# Patient Record
Sex: Female | Born: 1935
Health system: Southern US, Community
[De-identification: ages and names within clinical notes are randomized; demographics above are authoritative.]

## PROBLEM LIST (undated history)

## (undated) DIAGNOSIS — F0390 Unspecified dementia without behavioral disturbance: Secondary | ICD-10-CM

## (undated) DIAGNOSIS — R05 Cough: Secondary | ICD-10-CM

## (undated) DIAGNOSIS — G4730 Sleep apnea, unspecified: Secondary | ICD-10-CM

## (undated) DIAGNOSIS — I10 Essential (primary) hypertension: Secondary | ICD-10-CM

## (undated) DIAGNOSIS — K449 Diaphragmatic hernia without obstruction or gangrene: Secondary | ICD-10-CM

## (undated) DIAGNOSIS — M792 Neuralgia and neuritis, unspecified: Secondary | ICD-10-CM

## (undated) DIAGNOSIS — I509 Heart failure, unspecified: Secondary | ICD-10-CM

## (undated) DIAGNOSIS — M109 Gout, unspecified: Secondary | ICD-10-CM

## (undated) DIAGNOSIS — E039 Hypothyroidism, unspecified: Secondary | ICD-10-CM

## (undated) DIAGNOSIS — IMO0001 Reserved for inherently not codable concepts without codable children: Secondary | ICD-10-CM

## (undated) DIAGNOSIS — Z22322 Carrier or suspected carrier of Methicillin resistant Staphylococcus aureus: Secondary | ICD-10-CM

## (undated) DIAGNOSIS — J302 Other seasonal allergic rhinitis: Secondary | ICD-10-CM

## (undated) DIAGNOSIS — R059 Cough, unspecified: Secondary | ICD-10-CM

## (undated) DIAGNOSIS — M1990 Unspecified osteoarthritis, unspecified site: Secondary | ICD-10-CM

## (undated) DIAGNOSIS — K219 Gastro-esophageal reflux disease without esophagitis: Secondary | ICD-10-CM

## (undated) DIAGNOSIS — J449 Chronic obstructive pulmonary disease, unspecified: Secondary | ICD-10-CM

## (undated) DIAGNOSIS — I739 Peripheral vascular disease, unspecified: Secondary | ICD-10-CM

## (undated) DIAGNOSIS — J45909 Unspecified asthma, uncomplicated: Secondary | ICD-10-CM

## (undated) DIAGNOSIS — I499 Cardiac arrhythmia, unspecified: Secondary | ICD-10-CM

## (undated) HISTORY — PX: COLONOSCOPY: SHX174

## (undated) HISTORY — PX: TONSILLECTOMY: SUR1361

## (undated) HISTORY — PX: BACK SURGERY: SHX140

## (undated) HISTORY — PX: UVULOPALATOPHARYNGOPLASTY: SHX827

## (undated) HISTORY — DX: Cardiac arrhythmia, unspecified: I49.9

## (undated) HISTORY — PX: ANKLE ARTHROSCOPY: SUR85

## (undated) HISTORY — PX: CHOLECYSTECTOMY: SHX55

## (undated) HISTORY — DX: Unspecified dementia, unspecified severity, without behavioral disturbance, psychotic disturbance, mood disturbance, and anxiety: F03.90

---

## 1993-02-24 HISTORY — PX: CARDIAC CATHETERIZATION: SHX172

## 2001-02-24 HISTORY — PX: PARATHYROIDECTOMY: SHX19

## 2003-12-13 ENCOUNTER — Ambulatory Visit: Payer: Self-pay

## 2003-12-19 ENCOUNTER — Ambulatory Visit: Payer: Self-pay

## 2004-07-16 ENCOUNTER — Ambulatory Visit: Payer: Self-pay | Admitting: Family Medicine

## 2004-11-19 ENCOUNTER — Ambulatory Visit: Payer: Self-pay | Admitting: Family Medicine

## 2005-10-09 ENCOUNTER — Ambulatory Visit: Payer: Self-pay | Admitting: Family Medicine

## 2006-11-09 ENCOUNTER — Ambulatory Visit: Payer: Self-pay | Admitting: Family Medicine

## 2006-11-11 ENCOUNTER — Ambulatory Visit: Payer: Self-pay | Admitting: Family Medicine

## 2006-12-09 ENCOUNTER — Ambulatory Visit: Payer: Self-pay | Admitting: Gastroenterology

## 2008-08-13 ENCOUNTER — Emergency Department: Payer: Self-pay | Admitting: Internal Medicine

## 2009-04-03 ENCOUNTER — Ambulatory Visit: Payer: Self-pay | Admitting: Unknown Physician Specialty

## 2009-04-24 ENCOUNTER — Ambulatory Visit: Payer: Self-pay | Admitting: Pain Medicine

## 2009-05-10 ENCOUNTER — Ambulatory Visit: Payer: Self-pay | Admitting: Pain Medicine

## 2009-07-03 ENCOUNTER — Ambulatory Visit: Payer: Self-pay | Admitting: Pain Medicine

## 2009-07-19 ENCOUNTER — Ambulatory Visit: Payer: Self-pay | Admitting: Pain Medicine

## 2011-04-03 ENCOUNTER — Emergency Department: Payer: Self-pay | Admitting: Emergency Medicine

## 2011-04-03 LAB — URINALYSIS, COMPLETE
Bilirubin,UR: NEGATIVE
Glucose,UR: NEGATIVE mg/dL (ref 0–75)
Hyaline Cast: 2
Protein: NEGATIVE
Squamous Epithelial: 3

## 2011-04-03 LAB — COMPREHENSIVE METABOLIC PANEL
Albumin: 3.5 g/dL (ref 3.4–5.0)
Anion Gap: 8 (ref 7–16)
Calcium, Total: 10 mg/dL (ref 8.5–10.1)
Chloride: 106 mmol/L (ref 98–107)
EGFR (African American): 47 — ABNORMAL LOW
Glucose: 102 mg/dL — ABNORMAL HIGH (ref 65–99)
Osmolality: 293 (ref 275–301)
Potassium: 5.1 mmol/L (ref 3.5–5.1)
Sodium: 141 mmol/L (ref 136–145)

## 2011-04-03 LAB — CBC
MCV: 85 fL (ref 80–100)
Platelet: 277 10*3/uL (ref 150–440)
RBC: 4.39 10*6/uL (ref 3.80–5.20)
RDW: 15.4 % — ABNORMAL HIGH (ref 11.5–14.5)
WBC: 12.7 10*3/uL — ABNORMAL HIGH (ref 3.6–11.0)

## 2011-04-03 LAB — TROPONIN I: Troponin-I: <0.02 ng/mL

## 2011-04-05 LAB — URINE CULTURE

## 2011-12-10 ENCOUNTER — Observation Stay: Payer: Self-pay | Admitting: Specialist

## 2011-12-10 LAB — COMPREHENSIVE METABOLIC PANEL
Albumin: 3.3 g/dL — ABNORMAL LOW (ref 3.4–5.0)
Alkaline Phosphatase: 92 U/L (ref 50–136)
Anion Gap: 9 (ref 7–16)
Bilirubin,Total: 0.5 mg/dL (ref 0.2–1.0)
Calcium, Total: 9.6 mg/dL (ref 8.5–10.1)
Creatinine: 1.16 mg/dL (ref 0.60–1.30)
Glucose: 108 mg/dL — ABNORMAL HIGH (ref 65–99)
Osmolality: 275 (ref 275–301)
Potassium: 4.1 mmol/L (ref 3.5–5.1)
SGOT(AST): 23 U/L (ref 15–37)
Sodium: 135 mmol/L — ABNORMAL LOW (ref 136–145)

## 2011-12-10 LAB — URINALYSIS, COMPLETE
Bilirubin,UR: NEGATIVE
Glucose,UR: NEGATIVE mg/dL (ref 0–75)
Hyaline Cast: 5
Ketone: NEGATIVE
Ph: 6 (ref 4.5–8.0)
Protein: NEGATIVE
RBC,UR: 2 /HPF (ref 0–5)
Squamous Epithelial: 3
WBC UR: 81 /HPF (ref 0–5)

## 2011-12-10 LAB — CBC: HGB: 11.7 g/dL — ABNORMAL LOW (ref 12.0–16.0)

## 2011-12-10 LAB — CK TOTAL AND CKMB (NOT AT ARMC): CK-MB: 0.7 ng/mL (ref 0.5–3.6)

## 2011-12-10 LAB — TROPONIN I: Troponin-I: <0.02 ng/mL

## 2011-12-11 LAB — CBC WITH DIFFERENTIAL/PLATELET
Basophil #: 0.1 10*3/uL (ref 0.0–0.1)
Basophil %: 0.4 %
Eosinophil %: 6.1 %
Lymphocyte #: 3.1 10*3/uL (ref 1.0–3.6)
MCH: 27.6 pg (ref 26.0–34.0)
MCV: 85 fL (ref 80–100)
Monocyte #: 1.4 x10 3/mm — ABNORMAL HIGH (ref 0.2–0.9)
Platelet: 280 10*3/uL (ref 150–440)
RBC: 3.63 10*6/uL — ABNORMAL LOW (ref 3.80–5.20)
RDW: 14.4 % (ref 11.5–14.5)

## 2011-12-11 LAB — BASIC METABOLIC PANEL
Anion Gap: 7 (ref 7–16)
BUN: 30 mg/dL — ABNORMAL HIGH (ref 7–18)
Calcium, Total: 9.5 mg/dL (ref 8.5–10.1)
Chloride: 100 mmol/L (ref 98–107)
EGFR (African American): 44 — ABNORMAL LOW
EGFR (Non-African Amer.): 38 — ABNORMAL LOW
Glucose: 94 mg/dL (ref 65–99)
Osmolality: 272 (ref 275–301)
Potassium: 4.4 mmol/L (ref 3.5–5.1)
Sodium: 133 mmol/L — ABNORMAL LOW (ref 136–145)

## 2011-12-15 ENCOUNTER — Emergency Department: Payer: Self-pay | Admitting: Emergency Medicine

## 2011-12-15 LAB — COMPREHENSIVE METABOLIC PANEL
Alkaline Phosphatase: 95 U/L (ref 50–136)
Anion Gap: 4 — ABNORMAL LOW (ref 7–16)
Bilirubin,Total: 0.2 mg/dL (ref 0.2–1.0)
Calcium, Total: 10.2 mg/dL — ABNORMAL HIGH (ref 8.5–10.1)
Co2: 29 mmol/L (ref 21–32)
EGFR (Non-African Amer.): >60
Glucose: 88 mg/dL (ref 65–99)
Osmolality: 275 (ref 275–301)
Potassium: 5.1 mmol/L (ref 3.5–5.1)
SGOT(AST): 13 U/L — ABNORMAL LOW (ref 15–37)
Sodium: 137 mmol/L (ref 136–145)

## 2011-12-15 LAB — CBC
HCT: 34.7 % — ABNORMAL LOW (ref 35.0–47.0)
MCHC: 32 g/dL (ref 32.0–36.0)
Platelet: 378 10*3/uL (ref 150–440)
RBC: 4.13 10*6/uL (ref 3.80–5.20)
RDW: 14.4 % (ref 11.5–14.5)
WBC: 8.2 10*3/uL (ref 3.6–11.0)

## 2011-12-15 LAB — URINALYSIS, COMPLETE
Bacteria: NONE SEEN
Glucose,UR: NEGATIVE mg/dL (ref 0–75)
Hyaline Cast: 3
Ketone: NEGATIVE
Leukocyte Esterase: NEGATIVE
Nitrite: NEGATIVE
Protein: NEGATIVE
RBC,UR: 2 /HPF (ref 0–5)
WBC UR: 1 /HPF (ref 0–5)

## 2011-12-15 LAB — URIC ACID: Uric Acid: 2.4 mg/dL — ABNORMAL LOW (ref 2.6–6.0)

## 2011-12-15 LAB — TROPONIN I: Troponin-I: <0.02 ng/mL

## 2013-01-19 ENCOUNTER — Inpatient Hospital Stay: Payer: Self-pay | Admitting: Internal Medicine

## 2013-01-19 LAB — TROPONIN I
Troponin-I: <0.02 ng/mL
Troponin-I: 0.09 ng/mL — ABNORMAL HIGH
Troponin-I: 0.07 ng/mL — ABNORMAL HIGH

## 2013-01-19 LAB — COMPREHENSIVE METABOLIC PANEL
Albumin: 3.3 g/dL — ABNORMAL LOW (ref 3.4–5.0)
Alkaline Phosphatase: 76 U/L
Anion Gap: 4 — ABNORMAL LOW (ref 7–16)
Chloride: 107 mmol/L (ref 98–107)
Co2: 28 mmol/L (ref 21–32)
EGFR (African American): >60
EGFR (Non-African Amer.): 53 — ABNORMAL LOW
Osmolality: 281 (ref 275–301)
SGPT (ALT): 13 U/L (ref 12–78)
Sodium: 139 mmol/L (ref 136–145)

## 2013-01-19 LAB — CBC
HGB: 11.9 g/dL — ABNORMAL LOW (ref 12.0–16.0)
MCH: 25.5 pg — ABNORMAL LOW (ref 26.0–34.0)
MCV: 81 fL (ref 80–100)
Platelet: 278 10*3/uL (ref 150–440)
RBC: 4.67 10*6/uL (ref 3.80–5.20)

## 2013-01-19 LAB — URINALYSIS, COMPLETE
Bacteria: NONE SEEN
Bilirubin,UR: NEGATIVE
Ketone: NEGATIVE
Nitrite: NEGATIVE
Ph: 6 (ref 4.5–8.0)
Protein: NEGATIVE
RBC,UR: 1 /HPF (ref 0–5)
WBC UR: NONE SEEN /HPF (ref 0–5)

## 2013-01-19 LAB — CK TOTAL AND CKMB (NOT AT ARMC)
CK, Total: 259 U/L — ABNORMAL HIGH (ref 21–215)
CK-MB: 0.7 ng/mL (ref 0.5–3.6)

## 2013-01-19 LAB — LIPID PANEL
HDL Cholesterol: 74 mg/dL — ABNORMAL HIGH (ref 40–60)
Triglycerides: 152 mg/dL (ref 0–200)
VLDL Cholesterol, Calc: 30 mg/dL (ref 5–40)

## 2013-01-19 LAB — TSH: Thyroid Stimulating Horm: 8.87 u[IU]/mL — ABNORMAL HIGH

## 2013-01-19 LAB — CK
CK, Total: 247 U/L — ABNORMAL HIGH (ref 21–215)
CK, Total: 250 U/L — ABNORMAL HIGH (ref 21–215)

## 2013-01-20 LAB — LIPID PANEL
Cholesterol: 148 mg/dL (ref 0–200)
HDL Cholesterol: 68 mg/dL — ABNORMAL HIGH (ref 40–60)
Ldl Cholesterol, Calc: 47 mg/dL (ref 0–100)
Triglycerides: 164 mg/dL (ref 0–200)
VLDL Cholesterol, Calc: 33 mg/dL (ref 5–40)

## 2013-01-20 LAB — MAGNESIUM: Magnesium: 1.9 mg/dL

## 2013-01-20 LAB — PROTIME-INR
INR: 1
Prothrombin Time: 13.3 secs (ref 11.5–14.7)

## 2013-05-25 ENCOUNTER — Ambulatory Visit: Payer: Self-pay | Admitting: Urology

## 2013-05-25 DIAGNOSIS — N23 Unspecified renal colic: Secondary | ICD-10-CM | POA: Insufficient documentation

## 2013-05-25 DIAGNOSIS — N3946 Mixed incontinence: Secondary | ICD-10-CM | POA: Insufficient documentation

## 2013-05-25 DIAGNOSIS — M5430 Sciatica, unspecified side: Secondary | ICD-10-CM | POA: Insufficient documentation

## 2013-05-25 DIAGNOSIS — N3020 Other chronic cystitis without hematuria: Secondary | ICD-10-CM | POA: Insufficient documentation

## 2013-05-25 DIAGNOSIS — R339 Retention of urine, unspecified: Secondary | ICD-10-CM | POA: Insufficient documentation

## 2013-05-25 DIAGNOSIS — N281 Cyst of kidney, acquired: Secondary | ICD-10-CM | POA: Insufficient documentation

## 2013-06-15 DIAGNOSIS — M1990 Unspecified osteoarthritis, unspecified site: Secondary | ICD-10-CM | POA: Insufficient documentation

## 2013-06-15 DIAGNOSIS — K219 Gastro-esophageal reflux disease without esophagitis: Secondary | ICD-10-CM | POA: Insufficient documentation

## 2013-06-15 DIAGNOSIS — J449 Chronic obstructive pulmonary disease, unspecified: Secondary | ICD-10-CM | POA: Insufficient documentation

## 2013-06-15 DIAGNOSIS — I10 Essential (primary) hypertension: Secondary | ICD-10-CM | POA: Insufficient documentation

## 2013-06-15 DIAGNOSIS — F419 Anxiety disorder, unspecified: Secondary | ICD-10-CM | POA: Insufficient documentation

## 2013-12-08 DIAGNOSIS — R0602 Shortness of breath: Secondary | ICD-10-CM | POA: Insufficient documentation

## 2013-12-08 DIAGNOSIS — I890 Lymphedema, not elsewhere classified: Secondary | ICD-10-CM | POA: Insufficient documentation

## 2013-12-08 DIAGNOSIS — R600 Localized edema: Secondary | ICD-10-CM | POA: Insufficient documentation

## 2014-01-03 DIAGNOSIS — E785 Hyperlipidemia, unspecified: Secondary | ICD-10-CM | POA: Insufficient documentation

## 2014-05-16 ENCOUNTER — Ambulatory Visit: Payer: Self-pay | Admitting: Family Medicine

## 2014-06-13 NOTE — Discharge Summary (Signed)
PATIENT NAME:  [name], [name] MR#:  [id number] DATE OF BIRTH:  October 07, 1930  DATE OF ADMISSION:  12/10/2011 DATE OF DISCHARGE:  12/11/2011  For a detailed note, please take a look at the history and physical done by Dr. Pearletha Furl on admission.    DIAGNOSES AT DISCHARGE:  1. Lower extremity weakness and redness secondary to a suspected gout attack/cellulitis, now resolved.  2. Urinary tract infection.  3. Hypertension.  4. Obesity.  5. Peripheral neuropathy.   DIET: The patient is being discharged on a low sodium diet.   ACTIVITY: As tolerated.   FOLLOW-UP:  1. Follow-up with Dr. Domenick Gong in the next 1 to 2 weeks.  2. The patient is being discharged on home health physical therapy services.   DISCHARGE MEDICATIONS:  1. Lasix 40 mg b.i.d.  2. Singulair 10 mg daily.  3. Gabapentin 300 mg t.i.d.  4. Lyrica 75 mg b.i.d.  5. Macrobid 100 mg daily.  6. Uloric 80 mg daily.  7. Imipramine 25 mg daily.  8. Tylenol 500 mg as needed for headache. 9. Lisinopril 40 mg daily.  10. Ceftin 250 mg b.i.d. x5 days.   PERTINENT STUDIES DONE DURING THE HOSPITAL COURSE: Chest x-ray on admission showing poor inspiration, atelectasis.   Urine culture growing 50,000 colonies of gram-negative rod which is yet to be identified.   BRIEF HOSPITAL COURSE: This is a 81 year old female with medical problems as mentioned above who presented to the hospital on October 16th secondary to weakness and lower extremity redness.  1. Lower extremity redness/weakness. The exact etiology of this was unclear although the patient was thought to have a suspected lower extremity cellulitis with also an acute gout attack. Therefore, she was treated for both. She was started on IV ceftriaxone and also started on Uloric and Naprosyn as needed for her gout attack. Overnight the patient's redness, swelling, and also pain has significantly improved. Likely the cause of her redness and swelling was probably poor circulation and  chronic venostasis. She was strongly advised to keep a compression stocking on and keep her leg elevated. Unlikely this was a gout attack or acute cellulitis but she is empirically being discharged on p.o. Ceftin. Her white cell count was 18,000 on admission and has come down to 11.9. She was ambulated with the help of physical therapy and she qualified for home health services which is being arranged for her.  2. Urinary tract infection. She had an abnormal urinalysis on admission. She was empirically started on ceftriaxone. She currently is being discharged on p.o. Ceftin as stated.  3. Hypertension. The patient remained hemodynamically stable on her lisinopril which she will resume upon discharge.  4. Peripheral neuropathy. The patient was maintained on her gabapentin and Lyrica. She will resume that.   CODE STATUS: The patient is a FULL CODE.   TIME SPENT WITH THE DISCHARGE: 40 minutes.   ____________________________ Belia Heman. Verdell Carmine, MD vjs:drc D: 12/11/2011 14:22:04 ET T: 12/12/2011 11:16:36 ET JOB#: [id number]  cc: Belia Heman. Verdell Carmine, MD, <Dictator> Fonnie Jarvis. Ilene Qua, MD Henreitta Leber MD ELECTRONICALLY SIGNED 12/12/2011 12:22

## 2014-06-13 NOTE — H&P (Signed)
PATIENT NAME:  [name], [name] MR#:  [id number] DATE OF BIRTH:  11/08/1930  DATE OF ADMISSION:  12/10/2011  PRIMARY CARE PHYSICIAN: Dr. Billey Gosling ER PHYSICIAN: Dr. Renee Ramus ADMITTING PHYSICIAN: Dr. Pearletha Furl   PRESENTING COMPLAINT: Weakness and leg swelling x3 days.   HISTORY OF PRESENT ILLNESS: Patient is a 81 year old lady who was in her usual state of health until three days ago when started having progressive weakness. This was preceded by right leg swelling and redness. This started around the first toe and spreading down the rest of the leg. Denies any nausea, vomiting. No diarrhea. No recent long distance travel, sick contacts. Admits to episodes of fever, generalized body aches and body pain. Also admits to dysuria and frequency. No hematuria. With progressive insipidus presented today and was referred to hospitalist for workup which showed a urinary tract infection and possible cellulitis with gout right lower extremity. Patient denies any recent long distance travel, trauma or medication change.   REVIEW OF SYSTEMS: CONSTITUTIONAL: Positive for fever, weakness. EYES: No blurred vision, redness, discharge. ENT: No tinnitus, epistaxis, or difficulty swallowing. RESPIRATORY: No cough or shortness of breath. CARDIOVASCULAR: No chest pain, palpitations, syncope, exertional dyspnea. GASTROINTESTINAL: No nausea, vomiting, diarrhea, abdominal pain, or change in bowel habits. GENITOURINARY: Has dysuria and frequency but no hematuria. ENDOCRINE: No polyuria, polydipsia, heat or cold intolerance. HEMATOLOGIC: No anemia, easy bruising, bleeding, or swollen glands. SKIN: Has a rash and redness right lower extremity around the toe with swelling throughout the legs. NEURO: No numbness, dementia or seizures. MUSCULOSKELETAL: Has some tenderness around the foot, mostly at the base of the first toe. PSYCH: No anxiety or depression.   PAST MEDICAL HISTORY:  1. Hypothyroidism.  2. Obstructive sleep apnea, on  CPAP. 3. Morbid obesity. 4. Chronic pain.  5. Gastroesophageal reflux disease.  6. Chronic obstructive pulmonary disease.  7. Primary hyperparathyroidism.  8. History of asthma.  9. History of gout.   PAST SURGICAL HISTORY:  1. Throat surgery for parathyroid.  2. Appendectomy.  3. Thyroidectomy.  4. Cholecystectomy.  5. Ankle surgery.  6. Tonsillectomy. 7. Back surgery.   SOCIAL HISTORY:  Lives at home with the family. No alcohol, tobacco, or recreational drug use.   FAMILY HISTORY: Positive for renal disease but no coronary artery disease or diabetes.   ALLERGIES: Sulfa.   HOME MEDICATIONS:  1. Headache relief 500/25 mg 1 tablet at bedtime p.r.n.  2. Gabapentin 300 mg 3 times daily. 3. Lyrica 75 mg twice a day. 4. Imipramine 25 mg daily.  5. Uloric 80 mg daily.  6. Diuretic 40 mg Lasix twice a day.  7. Montelukast 10 mg once a day. 8. Nitrofurantoin microcrystals 100 mg capsules daily.   PHYSICAL EXAMINATION:  VITAL SIGNS: Temperature 99, pulse 78, respiratory rate 18, blood pressure 146/57 on arrival, now is 135/94.   GENERAL: Obese, elderly lady lying on the gurney, awake, alert, oriented in time, place, and person, in no distress.   HEENT: Atraumatic, normocephalic. Pupils equal, reactive to light, accommodation. Extraocular movement intact. Mucous membranes pink, moist. Patient is edentulous.   NECK: Supple. No JV distention.   CHEST: Good air entry. Few transmitted breath sounds. No rhonchi. No rales.   HEART: Regular rate, rhythm. No murmur.   ABDOMEN: Obese, pendulous, moves with respiration, nontender. Bowel sounds normoactive. No organomegaly.   EXTREMITIES: Trace bilateral pitting edema, right more than left with associated redness, swelling right foot starting from the base of the first toe from the metatarsophalangeal joint to the  distal portion of the foot. Tender to touch, warm.   NEUROLOGICAL: Cranial nerves II through XII grossly intact. No focal  deficits.   PSYCH: Affect appropriate to situation.   LABORATORY, DIAGNOSTIC, AND RADIOLOGICAL DATA: EKG showed normal sinus rhythm, rate of 71. CBC: White count 18, hemoglobin 12, platelets 348. Chemistry unremarkable. Creatinine 1.1, potassium 4.1, glucose 108, calcium 9.6. LFTs normal. CK 381. Troponin negative. Urinalysis shows positive nitrites, leukocyte esterase 3+, WBC 81, bacteria 3+.   IMPRESSION:  1. Urinary tract infection.  2. Gout right first toe, to rule out cellulitis.  3. Obstructive sleep apnea on CPAP. 4. Hypothyroidism. 5. Gastroesophageal reflux disease.  6. Obesity.  7. History of gout. 8. Asthma.   PLAN: Admit to general medical floor under observation for blood culture x2. Check TSH, fasting lipid profile, CBC. Respiratory support. Continue CPAP at night.  PT and INR. Antibiotics for urinary tract infection with ceftriaxone and NSAID therapy for gout treatment with naproxen. GI prophylaxis with Protonix. Deep vein thrombosis prophylaxis with Lovenox.   CODE STATUS: FULL CODE.   TOTAL PATIENT CARE TIME: 50 minutes.   ____________________________ Jules Husbands Pearletha Furl, MD mia:cms D: 12/10/2011 05:31:41 ET T: 12/10/2011 06:55:04 ET JOB#: [id number]  cc: Rayshawn Maney I. Pearletha Furl, MD, <Dictator> Fonnie Jarvis. Ilene Qua, MD Carola Frost MD ELECTRONICALLY SIGNED 12/11/2011 3:41

## 2014-06-16 NOTE — Discharge Summary (Signed)
PATIENT NAME:  [name], [name] MR#:  [id number] DATE OF BIRTH:  [birth year]  DATE OF ADMISSION:  01/19/2013 DATE OF DISCHARGE:  01/20/2013  ADMISSION DIAGNOSIS: 1.  Fall. 2.  Atrial fibrillation.   DISCHARGE DIAGNOSES: 1.  Short-lived atrial fibrillation.  2.  Bradycardia.  3.  Fall.  4.  History obstructive sleep apnea, not compliant with her CPAP machine.  5.  Accelerated hypertension. 6.  Hypothyroidism. 7.  Elevated troponin.   CONSULTATIONS:  Cardiology.   DISCHARGE LABORATORY DATA:  Cholesterol 148, triglycerides 168, HDL 58, LDL 47, TSH 8.87. Magnesium is 1.9. INR is 1.0, troponin max 0.09, discharge 0.07.  A 2-D echocardiogram showed an ejection fraction of 60% to 65% with moderately increased left ventricular posterior wall thickness and mild mitral valve regurg. TSH was 8.87 if she correction.   HOSPITAL COURSE: An 81 year old female status post a fall was found to have atrial fibrillation in the ER and bradycardia. For further details, please refer to the H and P.  1.  New onset slow atrial fibrillation with bradycardia. The patient had a very short lived atrial fibrillation only seen in the ER, not seen anywhere else on telemetry or echocardiogram. Cardiology recommended no further workup.  2.  Elevated troponin secondary to demand ischemia. Cardiology recommended no further workup. Her troponin max is 0.09. She was continued on aspirin. No beta blocker due to her bradycardia.  3.  Bradycardia. The patient is asymptomatic as per cardiology. No need for pacemaker.  4.  Fall. PT did recommend possible short-term rehab but the patient wanted to go home with home health care, which is arranged by case management. 5.  Accelerated hypertension, which improved on lisinopril and Norvasc.  6.  Obstructive sleep apnea. The patient does not use CPAP at home.  7.  Hypothyroidism. Her TSH was elevated. We increased her Synthroid dose and she will need followup as an outpatient. 8.  Acute  bronchitis. The patient probably had acute bronchitis with some mild wheezing. She was discharged on a Z-Pak.    DISCHARGE VITAL SIGNS: Temperature 97.6, pulse is 46 to 54, respirations 18, blood pressure 148/64, 92% on room air.  GENERAL: The patient is obese, not in acute distress.  HEENT: Head is atraumatic. Pupils are round . Sclerae anicteric. Mucous membranes are moist.  CARDIOVASCULAR: Regular rate and rhythm. No murmurs, gallops or rubs.  LUNGS: Clear to auscultation.  She has some very mild wheezing. No crackles or rattling.  EXTREMITIES: Very minimal edema. No clubbing or cyanosis.  ABDOMEN: Bowel sounds are positive. Nontender, nondistended.  NEUROLOGIC: Cranial nerves II through XII are intact.  SKIN: Without rash or lesions.   DISCHARGE MEDICATIONS: 1.  Lasix 40 mg daily.  2.  Paxil 10 mg daily.  3.  Aspirin 81 mg daily.  4.  Tylenol 500 mg q. 6 hours p.r.n. pain.  5.  Azithromycin Z-Pak for upper respiratory infection.  6.  Synthroid 175 mcg daily.  7.  Nystatin b.i.d.  8.  Norvasc 10 mg daily.  9.  Lisinopril 20 mg 2 tablets daily.   The patient is medically stable for discharge.  TIME SPENT: Approximately 40 minutes   ____________________________ Shalene Gallen P. Benjie Karvonen, MD spm:ce D: 01/20/2013 14:37:49 ET T: 01/20/2013 16:29:11 ET JOB#: [id number]  cc: Keiry Kowal P. Benjie Karvonen, MD, <Dictator> [facility] Fonnie Jarvis. Ilene Qua, MD Isaias Cowman, MD Donell Beers Higinio Grow MD ELECTRONICALLY SIGNED 01/20/2013 21:37

## 2014-06-16 NOTE — H&P (Signed)
PATIENT NAME:  NIKALA, WALSWORTH MR#:  [id number] DATE OF BIRTH:  11-03-1930  DATE OF ADMISSION:  01/19/2013  PRIMARY CARE PHYSICIAN: Eulas Post R. Ilene Qua, MD  REFERRING PHYSICIAN: Loney Hering, MD  CHIEF COMPLAINT: Fall.   HISTORY OF PRESENT ILLNESS: Ms. Papadopoulos is an 81 year old morbidly obese white female with a past medical history of hypertension, hyperlipidemia, hypothyroidism, gout, who presented to the Emergency Department with complaints of fall. The patient states that woke up in the middle of the night to go to bathroom, lost her balance and fell down. The patient states that when she turned around or twisted her ankle, lost balance and fell down. Denies any loss of consciousness. The patient states had frequent falls about a year back. At that time, all her sedative medications were held. The patient is still on gabapentin, Lyrica and imipramine. The patient states sleeps most of the day. The patient has history of sleep apnea, does not use CPAP. Workup in the Emergency Department shows TSH of 8.87. Otherwise, the rest of all the workup is negative, including cardiac enzymes. The patient is noted to have bradycardia with a heart rate in the 40s to 50s. The patient is not on any heart rate controlling medications. The patient is also found to have systolic blood pressure in the 190 to 200. The patient received 1 dose of hydralazine and persistently had blood pressure in the 190s. EKG showed new-onset atrial fibrillation. The patient has no previous history of atrial fibrillation. The patient states that experiences palpitations.   PAST MEDICAL HISTORY:  1. Hypertension.  2. Hyperlipidemia.  3. Obstructive sleep apnea.  4. Morbid obesity.  5. Gastroesophageal reflux disease.  6. COPD.  7. Primary hyperparathyroidism.  8. Asthma.  9. Gout.   PAST SURGICAL HISTORY:  1. Throat surgery for the parathyroid.   2. Appendectomy.  3. Thyroidectomy.  4. Cholecystectomy.  5. Ankle surgery.  6.  Tonsillectomy.  7. Back surgery.    ALLERGIES: SULFA.  HOME MEDICATIONS:  1. Paxil 10 mg once a day.  2. Lisinopril 20 mg daily.  3.  150 mcg once a day.  4. Lasix 40 mg b.i.d.  5. Aspirin 81 mg daily.  6. Acetaminophen 1 tablet every 6 hours as needed.   SOCIAL HISTORY: No history of smoking, drinking alcohol or using illicit drugs. Married, lives with her husband, who is not well functional at baseline.   FAMILY HISTORY: Positive for renal disease.   REVIEW OF SYSTEMS:  CONSTITUTIONAL: Experiences generalized fatigue.  EYES: No change in vision.  ENT: No change in hearing.  RESPIRATORY: No cough, shortness of breath.  CARDIOVASCULAR: No chest pain, palpitations.  GASTROINTESTINAL: No nausea, vomiting or abdominal pain.  GENITOURINARY: No dysuria or hematuria.  ENDOCRINE: No polyuria or polydipsia.  HEMATOLOGIC: No easy bruising or bleeding.  SKIN: No rash or lesions.  MUSCULOSKELETAL: Has osteoarthritis.  NEUROLOGIC: No weakness or numbness in any part of the body.   PHYSICAL EXAMINATION:  GENERAL: This is a well-built, well-nourished, morbidly obese female lying down in the bed, not in distress.  VITAL SIGNS: Temperature 97.6, pulse 52, blood pressure 195/90, respiratory rate of 16, oxygen saturation is 96% on room air.  HEENT: Head: Normocephalic, atraumatic. Eyes: No sclerae icterus. Conjunctivae normal. Pupils equal and reactive to light. Mucous membranes moist. No pharyngeal erythema.  NECK: Supple. No lymphadenopathy. No JVD. No carotid bruit. No thyromegaly.  CHEST: Has no focal tenderness. LUNGS: Bilaterally clear to auscultation.  HEART: S1, S2. Irregularly irregular. Muffled  sounds secondary to the patient's body habitus. No pedal edema. Pulses 2+.  ABDOMEN: Obese. Bowel soft present. Soft, nontender, nondistended. Could not appreciate any hepatosplenomegaly.  SKIN: No rash or lesions.  MUSCULOSKELETAL: Good range of motion in all the extremities.  NEUROLOGIC:  The patient is alert, oriented to place, person and time. Cranial nerves II through XII intact. Motor 5/5 in upper and lower extremities. No sensory deficits.   LABORATORY DATA: CMP is completely within normal limits. CBC: WBC of 11.4, hemoglobin 11.9. TSH 8.8. Troponin less than 0.02. BNP 332. Chest x-ray, 1-view, portable: No acute cardiopulmonary disease.   ASSESSMENT AND PLAN: Ms. Bickhart is an 81 year old female who comes to the Emergency Department after having a fall.  1. Fall. This seems to be more of a mechanical; however, cannot exclude considering the patient's bradycardia and uncontrolled hypertension, new-onset atrial fibrillation. Admit the patient to a monitored bed. Continue to cycle cardiac enzymes x3. Will also involve the physical therapy, occupational therapy.  2. New-onset atrial fibrillation. The patient's TSH is 8.85. Also, considering the patient's bradycardia, accelerated hypertension, will also obtain echocardiogram.  3. Bradycardia. Could be from the obstructive sleep apnea; however, cannot exclude the sick sinus syndrome. Will consult cardiology.  4. Accelerated hypertension. Add Norvasc. Will also keep the patient on hydralazine as needed. 5. Morbid obesity. Counseled with the patient regarding diet and exercise. The patient states chronically debilitated. Will involve the physical therapy.  6. Obstructive sleep apnea. The patient is not on any CPAP. The patient states the patient is not able to tolerate the mask. May consider obtaining repeat sleep study with a different mask which is appropriate for the patient.  7. Frequent falls. Concern about the patient's multiple sedative medications. Hold all sedative medications. The patient does not have any diagnosed neuropathy. The patient is on 2 medications, Lyrica and gabapentin. Hold both of them. The patient's complaint seems to be more from the osteoarthritis pain in the joints rather than neuropathic pain. Recommended the  patient to take Tylenol as needed. The patient expressed understanding.  8. Keep the patient on deep vein thrombosis prophylaxis with Lovenox.   TIME SPENT: 50 minutes.   ____________________________ Monica Becton, MD pv:lb D: 01/19/2013 06:56:36 ET T: 01/19/2013 07:31:47 ET JOB#: [id number]  cc: Monica Becton, MD, <Dictator> Fonnie Jarvis. Ilene Qua, MD Monica Becton MD ELECTRONICALLY SIGNED 01/23/2013 0:42

## 2014-06-16 NOTE — Consult Note (Signed)
PATIENT NAME:  [name], [name] MR#:  [id number] DATE OF BIRTH:  31-Jan-1931  DATE OF CONSULTATION:  01/19/2013  REFERRING PHYSICIAN: Dr. Lunette Stands  CONSULTING PHYSICIAN:  Isaias Cowman, MD  PRIMARY CARE PHYSICIAN: Dr. Gaylan Gerold.   CHIEF COMPLAINT: "I fell."   REASON FOR CONSULTATION: Consultation requested for evaluation of bradycardia.   HISTORY OF PRESENT ILLNESS: The patient is an 81 year old female with obesity, hypertension, hyperlipidemia, hypothyroidism and history of sleep apnea, referred for evaluation of bradycardia. The patient reports that she woke up in the middle night,  lost her balance and fell. She was unable to get up so she called 911. Upon arrival, the patient was noted to be bradycardic, so was brought to [facility] Emergency Room. The patient is bradycardic with heart rates in the 40s and 50s in sinus rhythm in intermittent atrial fibrillation. The patient was hypertensive and received 1 dose of hydralazine. The patient now is normotensive. She denies history of chest pain, presyncope or syncope.   PAST MEDICAL HISTORY:  1.  Hypertension.  2.  Hyperlipidemia.  3.  Obstructive sleep apnea.  4.  Obesity.  5.  Chronic obstructive pulmonary disease.  6.  Gastroesophageal reflux disease.  7.  Asthma.  8.  Primary hyperparathyroidism.   MEDICATIONS: Lisinopril 20 mg daily, furosemide 40 mg b.i.d., aspirin 81 mg daily, Paxil 10 mg daily, acetaminophen 1 q.6 hours p.r.n.   SOCIAL HISTORY: The patient is married, lives with her husband. She denies tobacco abuse.   FAMILY HISTORY: No immediate family history of coronary artery disease or myocardial infarction.   REVIEW OF SYSTEMS: CONSTITUTIONAL: No fever or chills. The patient does have fatigue. EYES: No blurry vision. EARS: No hearing loss. RESPIRATORY: No shortness of breath. CARDIOVASCULAR: No chest pain, orthopnea, PND, pedal edema, presyncope or syncope. GASTROINTESTINAL: No nausea, vomiting, or diarrhea. GENITOURINARY: No  dysuria or hematuria. ENDOCRINE: No polyuria or polydipsia. HEMATOLOGICAL: No easy bruising or bruising or bleeding. INTEGUMENTARY: No rash. MUSCULOSKELETAL: The patient has osteoarthritis. NEUROLOGICAL: The patient denies focal muscle weakness or numbness. PSYCHOLOGICAL: No depression or anxiety.   PHYSICAL EXAMINATION:  VITAL SIGNS: Blood pressure 122/54, pulse 62, respirations 20, temperature 97.7, pulse oximetry 94%.  HEENT: Pupils equal, reactive to light and accommodation.  NECK: Supple without thyromegaly.  LUNGS: Clear.  HEART: Normal JVP. Normal PMI. Regular rate and rhythm. Normal S1, S2. No appreciable gallop, murmur, or rub.  ABDOMEN: Soft and nontender without hepatosplenomegaly.  EXTREMITIES: No cyanosis, clubbing, or edema. Pulses were intact bilaterally.  MUSCULOSKELETAL: Normal muscle tone.  NEUROLOGIC: The patient is alert and oriented x 3. Motor and sensory both grossly intact.   IMPRESSION: An 81 year old female, morbidly obese with sleep apnea, who presents after falling without syncope, was unable to get up, called EMS and was noted to be bradycardic. There appears to be no association between bradycardia and presenting symptoms. The patient denies presyncope or syncope. The patient was hypertensive, now normotensive. The patient has borderline elevated troponin 0.07, which is likely demand/supply ischemia without acute coronary syndrome.   RECOMMENDATIONS:  1.  I agree with current therapy.  2.  Would defer chronic anticoagulation for atrial fibrillation since the patient is a falling risk. 3.  Defer permanent pacemaker implantation since the patient appears to be asymptomatic as it relates her bradycardia.  4.  The patient has frequent falls, which is multifactorial secondary to neuropathy, obesity, which needs to be addressed. The patient likely needs further evaluation and a walker to be able to ambulate safely.  ____________________________ Isaias Cowman,  MD ap:aw D: 01/19/2013 13:31:41 ET T: 01/19/2013 13:39:13 ET JOB#: [id number]  cc: Isaias Cowman, MD, <Dictator> Isaias Cowman MD ELECTRONICALLY SIGNED 02/11/2013 8:50

## 2014-07-13 ENCOUNTER — Encounter: Payer: Self-pay | Admitting: *Deleted

## 2014-07-18 NOTE — Discharge Instructions (Signed)

## 2014-07-19 ENCOUNTER — Ambulatory Visit: Payer: Medicare PPO | Admitting: Anesthesiology

## 2014-07-19 ENCOUNTER — Ambulatory Visit
Admission: RE | Admit: 2014-07-19 | Discharge: 2014-07-19 | Disposition: A | Discharge status: Home / Self Care | Payer: Medicare PPO | Source: Ambulatory Visit | Attending: Ophthalmology | Admitting: Ophthalmology

## 2014-07-19 ENCOUNTER — Encounter: Payer: Self-pay | Admitting: Anesthesiology

## 2014-07-19 ENCOUNTER — Encounter
Admission: RE | Disposition: A | Discharge status: Home / Self Care | Payer: Self-pay | Source: Ambulatory Visit | Attending: Ophthalmology

## 2014-07-19 DIAGNOSIS — R062 Wheezing: Secondary | ICD-10-CM | POA: Diagnosis not present

## 2014-07-19 DIAGNOSIS — G629 Polyneuropathy, unspecified: Secondary | ICD-10-CM | POA: Diagnosis not present

## 2014-07-19 DIAGNOSIS — M109 Gout, unspecified: Secondary | ICD-10-CM | POA: Diagnosis not present

## 2014-07-19 DIAGNOSIS — R05 Cough: Secondary | ICD-10-CM | POA: Diagnosis not present

## 2014-07-19 DIAGNOSIS — K449 Diaphragmatic hernia without obstruction or gangrene: Secondary | ICD-10-CM | POA: Insufficient documentation

## 2014-07-19 DIAGNOSIS — R0602 Shortness of breath: Secondary | ICD-10-CM | POA: Insufficient documentation

## 2014-07-19 DIAGNOSIS — M7989 Other specified soft tissue disorders: Secondary | ICD-10-CM | POA: Insufficient documentation

## 2014-07-19 DIAGNOSIS — I10 Essential (primary) hypertension: Secondary | ICD-10-CM | POA: Diagnosis not present

## 2014-07-19 DIAGNOSIS — H2511 Age-related nuclear cataract, right eye: Secondary | ICD-10-CM | POA: Diagnosis not present

## 2014-07-19 DIAGNOSIS — E039 Hypothyroidism, unspecified: Secondary | ICD-10-CM | POA: Insufficient documentation

## 2014-07-19 DIAGNOSIS — J449 Chronic obstructive pulmonary disease, unspecified: Secondary | ICD-10-CM | POA: Insufficient documentation

## 2014-07-19 DIAGNOSIS — E119 Type 2 diabetes mellitus without complications: Secondary | ICD-10-CM | POA: Insufficient documentation

## 2014-07-19 DIAGNOSIS — F419 Anxiety disorder, unspecified: Secondary | ICD-10-CM | POA: Diagnosis not present

## 2014-07-19 DIAGNOSIS — M1990 Unspecified osteoarthritis, unspecified site: Secondary | ICD-10-CM | POA: Diagnosis not present

## 2014-07-19 DIAGNOSIS — K219 Gastro-esophageal reflux disease without esophagitis: Secondary | ICD-10-CM | POA: Insufficient documentation

## 2014-07-19 DIAGNOSIS — I509 Heart failure, unspecified: Secondary | ICD-10-CM | POA: Diagnosis not present

## 2014-07-19 DIAGNOSIS — G4730 Sleep apnea, unspecified: Secondary | ICD-10-CM | POA: Diagnosis not present

## 2014-07-19 DIAGNOSIS — Z882 Allergy status to sulfonamides status: Secondary | ICD-10-CM | POA: Diagnosis not present

## 2014-07-19 HISTORY — PX: CATARACT EXTRACTION W/PHACO: SHX586

## 2014-07-19 HISTORY — DX: Unspecified asthma, uncomplicated: J45.909

## 2014-07-19 HISTORY — DX: Diaphragmatic hernia without obstruction or gangrene: K44.9

## 2014-07-19 HISTORY — DX: Essential (primary) hypertension: I10

## 2014-07-19 HISTORY — DX: Peripheral vascular disease, unspecified: I73.9

## 2014-07-19 HISTORY — DX: Unspecified osteoarthritis, unspecified site: M19.90

## 2014-07-19 HISTORY — DX: Other seasonal allergic rhinitis: J30.2

## 2014-07-19 HISTORY — DX: Reserved for inherently not codable concepts without codable children: IMO0001

## 2014-07-19 HISTORY — DX: Cough: R05

## 2014-07-19 HISTORY — DX: Sleep apnea, unspecified: G47.30

## 2014-07-19 HISTORY — DX: Hypothyroidism, unspecified: E03.9

## 2014-07-19 HISTORY — DX: Chronic obstructive pulmonary disease, unspecified: J44.9

## 2014-07-19 HISTORY — DX: Gastro-esophageal reflux disease without esophagitis: K21.9

## 2014-07-19 HISTORY — DX: Cough, unspecified: R05.9

## 2014-07-19 HISTORY — DX: Neuralgia and neuritis, unspecified: M79.2

## 2014-07-19 HISTORY — DX: Gout, unspecified: M10.9

## 2014-07-19 SURGERY — PHACOEMULSIFICATION, CATARACT, WITH IOL INSERTION
Anesthesia: Monitor Anesthesia Care | Laterality: Right | Wound class: Clean

## 2014-07-19 MED ORDER — CEFUROXIME OPHTHALMIC INJECTION 1 MG/0.1 ML
INJECTION | OPHTHALMIC | Status: DC | PRN
  Administered 2014-07-19: 1 mg via INTRACAMERAL

## 2014-07-19 MED ORDER — TETRACAINE HCL 0.5 % OP SOLN
1.0000 [drp] | Freq: Once | OPHTHALMIC | Status: AC
  Administered 2014-07-19: 1 [drp] via OPHTHALMIC

## 2014-07-19 MED ORDER — POVIDONE-IODINE 5 % OP SOLN
1.0000 "application " | Freq: Once | OPHTHALMIC | Status: AC
  Administered 2014-07-19: 1 via OPHTHALMIC

## 2014-07-19 MED ORDER — FENTANYL CITRATE (PF) 100 MCG/2ML IJ SOLN
INTRAMUSCULAR | Status: DC | PRN
  Administered 2014-07-19: 50 ug via INTRAVENOUS

## 2014-07-19 MED ORDER — BRIMONIDINE TARTRATE 0.2 % OP SOLN
OPHTHALMIC | Status: DC | PRN
  Administered 2014-07-19: 1 [drp] via OPHTHALMIC

## 2014-07-19 MED ORDER — ACETAMINOPHEN 160 MG/5ML PO SOLN: 325.0000 mg | ORAL | Status: DC | PRN

## 2014-07-19 MED ORDER — EPINEPHRINE HCL 1 MG/ML IJ SOLN
INTRAMUSCULAR | Status: DC | PRN
  Administered 2014-07-19: 1 mg

## 2014-07-19 MED ORDER — ACETAMINOPHEN 325 MG PO TABS: 325.0000 mg | ORAL_TABLET | ORAL | Status: DC | PRN

## 2014-07-19 MED ORDER — NA HYALUR & NA CHOND-NA HYALUR 0.4-0.35 ML IO KIT
PACK | INTRAOCULAR | Status: DC | PRN
  Administered 2014-07-19: 1 mL via INTRAOCULAR

## 2014-07-19 MED ORDER — ARMC OPHTHALMIC DILATING GEL
1.0000 "application " | OPHTHALMIC | Status: DC | PRN
  Administered 2014-07-19 (×2): 1 via OPHTHALMIC

## 2014-07-19 MED ORDER — MIDAZOLAM HCL 2 MG/2ML IJ SOLN
INTRAMUSCULAR | Status: DC | PRN
  Administered 2014-07-19: 1 mg via INTRAVENOUS

## 2014-07-19 MED ORDER — TIMOLOL MALEATE 0.5 % OP SOLN
OPHTHALMIC | Status: DC | PRN
  Administered 2014-07-19: 1 [drp] via OPHTHALMIC

## 2014-07-19 SURGICAL SUPPLY — 25 items
CANNULA ANT/CHMB 27GA (MISCELLANEOUS) ×3 IMPLANT
GLOVE SURG LX 7.5 STRW (GLOVE) ×2
GLOVE SURG LX STRL 7.5 STRW (GLOVE) ×1 IMPLANT
GLOVE SURG TRIUMPH 8.0 PF LTX (GLOVE) ×3 IMPLANT
GOWN STRL REUS W/ TWL LRG LVL3 (GOWN DISPOSABLE) ×2 IMPLANT
GOWN STRL REUS W/TWL LRG LVL3 (GOWN DISPOSABLE) ×4
LENS IOL TECNIS 24.5 (Intraocular Lens) ×3 IMPLANT
LENS IOL TECNIS MONO 1P 24.5 (Intraocular Lens) ×1 IMPLANT
MARKER SKIN SURG W/RULER VIO (MISCELLANEOUS) ×3 IMPLANT
NDL RETROBULBAR .5 NSTRL (NEEDLE) IMPLANT
NEEDLE FILTER BLUNT 18X 1/2SAF (NEEDLE) ×2
NEEDLE FILTER BLUNT 18X1 1/2 (NEEDLE) ×1 IMPLANT
PACK CATARACT BRASINGTON (MISCELLANEOUS) ×3 IMPLANT
PACK EYE AFTER SURG (MISCELLANEOUS) ×3 IMPLANT
PACK OPTHALMIC (MISCELLANEOUS) ×3 IMPLANT
RING MALYGIN 7.0 (MISCELLANEOUS) IMPLANT
SUT ETHILON 10-0 CS-B-6CS-B-6 (SUTURE)
SUT VICRYL  9 0 (SUTURE)
SUT VICRYL 9 0 (SUTURE) IMPLANT
SUTURE EHLN 10-0 CS-B-6CS-B-6 (SUTURE) IMPLANT
SYR 3ML LL SCALE MARK (SYRINGE) ×3 IMPLANT
SYR 5ML LL (SYRINGE) IMPLANT
SYR TB 1ML LUER SLIP (SYRINGE) ×3 IMPLANT
WATER STERILE IRR 500ML POUR (IV SOLUTION) ×3 IMPLANT
WIPE NON LINTING 3.25X3.25 (MISCELLANEOUS) ×3 IMPLANT

## 2014-07-19 NOTE — Anesthesia Postprocedure Evaluation (Signed)
  Anesthesia Post-op Note  Patient: [name]  Procedure(s) Performed: Procedure(s): CATARACT EXTRACTION PHACO AND INTRAOCULAR LENS PLACEMENT (IOC) (Right)  Anesthesia type:MAC  Patient location: PACU  Post pain: Pain level controlled  Post assessment: Post-op Vital signs reviewed, Patient's Cardiovascular Status Stable, Respiratory Function Stable, Patent Airway and No signs of Nausea or vomiting  Post vital signs: Reviewed and stable  Last Vitals:  Filed Vitals:   07/19/14 1019  BP: 158/52  Pulse: 57  Temp: 36.5 C  Resp: 14    Level of consciousness: awake, alert  and patient cooperative  Complications: No apparent anesthesia complications

## 2014-07-19 NOTE — H&P (Signed)
  The History and Physical notes were scanned in.  The patient remains stable and unchanged from the H&P.   Previous H&P reviewed, patient examined, and there are no changes.  [name] 07/19/2014 8:46 AM

## 2014-07-19 NOTE — Transfer of Care (Signed)
Immediate Anesthesia Transfer of Care Note  Patient: [name]  Procedure(s) Performed: Procedure(s): CATARACT EXTRACTION PHACO AND INTRAOCULAR LENS PLACEMENT (IOC) (Right)  Patient Location: PACU  Anesthesia Type: MAC  Level of Consciousness: awake, alert  and patient cooperative  Airway and Oxygen Therapy: Patient Spontanous Breathing and Patient connected to supplemental oxygen  Post-op Assessment: Post-op Vital signs reviewed, Patient's Cardiovascular Status Stable, Respiratory Function Stable, Patent Airway and No signs of Nausea or vomiting  Post-op Vital Signs: Reviewed and stable  Complications: No apparent anesthesia complications

## 2014-07-19 NOTE — Anesthesia Preprocedure Evaluation (Signed)
Anesthesia Evaluation  Patient identified by MRN, date of birth, ID band Patient awake    Reviewed: Allergy & Precautions, H&P , Patient's Chart, lab work & pertinent test results  History of Anesthesia Complications (+) POST - OP SPINAL HEADACHE  Airway Mallampati: II  TM Distance: >3 FB Neck ROM: full    Dental   Pulmonary shortness of breath and with exertion, asthma , sleep apnea , COPD  Mild wheezing        Cardiovascular hypertension, Normal cardiovascular exam    Neuro/Psych    GI/Hepatic hiatal hernia, GERD-  Medicated,  Endo/Other  Hypothyroidism   Renal/GU      Musculoskeletal   Abdominal   Peds  Hematology   Anesthesia Other Findings   Reproductive/Obstetrics                             Anesthesia Physical Anesthesia Plan  ASA: III  Anesthesia Plan: MAC   Post-op Pain Management:    Induction:   Airway Management Planned:   Additional Equipment:   Intra-op Plan:   Post-operative Plan:   Informed Consent: I have reviewed the patients History and Physical, chart, labs and discussed the procedure including the risks, benefits and alternatives for the proposed anesthesia with the patient or authorized representative who has indicated his/her understanding and acceptance.     Plan Discussed with: CRNA  Anesthesia Plan Comments:         Anesthesia Quick Evaluation

## 2014-07-19 NOTE — Op Note (Signed)
LOCATION:  [place]   PREOPERATIVE DIAGNOSIS:    Nuclear sclerotic cataract right eye. H25.11   POSTOPERATIVE DIAGNOSIS:  Nuclear sclerotic cataract right eye.     PROCEDURE:  Phacoemusification with posterior chamber intraocular lens placement of the right eye   LENS:   Implant Name Type Inv. Item Serial No. Manufacturer Lot No. LRB No. Used  LENS IMPL INTRAOC ZCB00 24.5 - [id number] Intraocular Lens LENS IMPL INTRAOC ZCB00 24.5 [id number] AMO   Right 1        ULTRASOUND TIME: 13 % of 1 minutes, 11 seconds.  CDE 9.3   SURGEON:  Wyonia Hough, MD   ANESTHESIA:  Topical with tetracaine drops and 2% Xylocaine jelly.   COMPLICATIONS:  None.   DESCRIPTION OF PROCEDURE:  The patient was identified in the holding room and transported to the operating room and placed in the supine position under the operating microscope.  The right eye was identified as the operative eye and it was prepped and draped in the usual sterile ophthalmic fashion.   A 1 millimeter clear-corneal paracentesis was made at the 12:00 position.  The anterior chamber was filled with Viscoat viscoelastic.  A 2.4 millimeter keratome was used to make a near-clear corneal incision at the 9:00 position.  A curvilinear capsulorrhexis was made with a cystotome and capsulorrhexis forceps.  Balanced salt solution was used to hydrodissect and hydrodelineate the nucleus.   Phacoemulsification was then used in stop and chop fashion to remove the lens nucleus and epinucleus.  The remaining cortex was then removed using the irrigation and aspiration handpiece. Provisc was then placed into the capsular bag to distend it for lens placement.  A lens was then injected into the capsular bag.  The remaining viscoelastic was aspirated.   Wounds were hydrated with balanced salt solution.  The anterior chamber was inflated to a physiologic pressure with balanced salt solution.  No wound leaks were noted. Cefuroxime 0.1 ml of a  10mg /ml solution was injected into the anterior chamber for a dose of 1 mg of intracameral antibiotic at the completion of the case.   Timolol and Brimonidine drops were applied to the eye.  The patient was taken to the recovery room in stable condition without complications of anesthesia or surgery.   Eriel Dunckel 07/19/2014, 10:17 AM

## 2014-07-21 ENCOUNTER — Encounter: Payer: Self-pay | Admitting: Ophthalmology

## 2014-08-16 ENCOUNTER — Encounter: Payer: Self-pay | Admitting: *Deleted

## 2014-08-22 NOTE — Discharge Instructions (Signed)

## 2014-08-23 ENCOUNTER — Ambulatory Visit: Payer: Medicare PPO | Admitting: Anesthesiology

## 2014-08-23 ENCOUNTER — Encounter
Admission: RE | Disposition: A | Discharge status: Home / Self Care | Payer: Self-pay | Source: Ambulatory Visit | Attending: Ophthalmology

## 2014-08-23 ENCOUNTER — Ambulatory Visit
Admission: RE | Admit: 2014-08-23 | Discharge: 2014-08-23 | Disposition: A | Discharge status: Home / Self Care | Payer: Medicare PPO | Source: Ambulatory Visit | Attending: Ophthalmology | Admitting: Ophthalmology

## 2014-08-23 DIAGNOSIS — H2512 Age-related nuclear cataract, left eye: Secondary | ICD-10-CM | POA: Diagnosis present

## 2014-08-23 DIAGNOSIS — R05 Cough: Secondary | ICD-10-CM | POA: Insufficient documentation

## 2014-08-23 DIAGNOSIS — J449 Chronic obstructive pulmonary disease, unspecified: Secondary | ICD-10-CM | POA: Diagnosis not present

## 2014-08-23 DIAGNOSIS — G4730 Sleep apnea, unspecified: Secondary | ICD-10-CM | POA: Diagnosis not present

## 2014-08-23 DIAGNOSIS — K449 Diaphragmatic hernia without obstruction or gangrene: Secondary | ICD-10-CM | POA: Insufficient documentation

## 2014-08-23 DIAGNOSIS — E079 Disorder of thyroid, unspecified: Secondary | ICD-10-CM | POA: Diagnosis not present

## 2014-08-23 DIAGNOSIS — I509 Heart failure, unspecified: Secondary | ICD-10-CM | POA: Diagnosis not present

## 2014-08-23 DIAGNOSIS — Z9841 Cataract extraction status, right eye: Secondary | ICD-10-CM | POA: Insufficient documentation

## 2014-08-23 DIAGNOSIS — M79605 Pain in left leg: Secondary | ICD-10-CM | POA: Diagnosis not present

## 2014-08-23 DIAGNOSIS — K219 Gastro-esophageal reflux disease without esophagitis: Secondary | ICD-10-CM | POA: Insufficient documentation

## 2014-08-23 DIAGNOSIS — M25559 Pain in unspecified hip: Secondary | ICD-10-CM | POA: Insufficient documentation

## 2014-08-23 DIAGNOSIS — R062 Wheezing: Secondary | ICD-10-CM | POA: Diagnosis not present

## 2014-08-23 DIAGNOSIS — F419 Anxiety disorder, unspecified: Secondary | ICD-10-CM | POA: Diagnosis not present

## 2014-08-23 DIAGNOSIS — J45909 Unspecified asthma, uncomplicated: Secondary | ICD-10-CM | POA: Diagnosis not present

## 2014-08-23 DIAGNOSIS — M1990 Unspecified osteoarthritis, unspecified site: Secondary | ICD-10-CM | POA: Diagnosis not present

## 2014-08-23 DIAGNOSIS — Z885 Allergy status to narcotic agent status: Secondary | ICD-10-CM | POA: Insufficient documentation

## 2014-08-23 DIAGNOSIS — E119 Type 2 diabetes mellitus without complications: Secondary | ICD-10-CM | POA: Diagnosis not present

## 2014-08-23 DIAGNOSIS — M109 Gout, unspecified: Secondary | ICD-10-CM | POA: Diagnosis not present

## 2014-08-23 DIAGNOSIS — I10 Essential (primary) hypertension: Secondary | ICD-10-CM | POA: Insufficient documentation

## 2014-08-23 DIAGNOSIS — G629 Polyneuropathy, unspecified: Secondary | ICD-10-CM | POA: Diagnosis not present

## 2014-08-23 HISTORY — PX: CATARACT EXTRACTION W/PHACO: SHX586

## 2014-08-23 SURGERY — PHACOEMULSIFICATION, CATARACT, WITH IOL INSERTION
Anesthesia: General | Laterality: Left | Wound class: Clean

## 2014-08-23 MED ORDER — EPINEPHRINE HCL 1 MG/ML IJ SOLN
INTRAOCULAR | Status: DC | PRN
  Administered 2014-08-23: 57 mL via OPHTHALMIC

## 2014-08-23 MED ORDER — CEFUROXIME OPHTHALMIC INJECTION 1 MG/0.1 ML
INJECTION | OPHTHALMIC | Status: DC | PRN
Start: 2014-08-23 — End: 2014-08-23
  Administered 2014-08-23: .3 mL via INTRACAMERAL

## 2014-08-23 MED ORDER — ARMC OPHTHALMIC DILATING GEL
1.0000 "application " | OPHTHALMIC | Status: DC | PRN
  Administered 2014-08-23 (×2): 1 via OPHTHALMIC

## 2014-08-23 MED ORDER — MIDAZOLAM HCL 2 MG/2ML IJ SOLN
INTRAMUSCULAR | Status: DC | PRN
  Administered 2014-08-23: 2 mg via INTRAVENOUS

## 2014-08-23 MED ORDER — FENTANYL CITRATE (PF) 100 MCG/2ML IJ SOLN
INTRAMUSCULAR | Status: DC | PRN
  Administered 2014-08-23: 50 ug via INTRAVENOUS

## 2014-08-23 MED ORDER — NA HYALUR & NA CHOND-NA HYALUR 0.4-0.35 ML IO KIT
PACK | INTRAOCULAR | Status: DC | PRN
  Administered 2014-08-23: 1 mL via INTRAOCULAR

## 2014-08-23 MED ORDER — POVIDONE-IODINE 5 % OP SOLN
1.0000 "application " | Freq: Once | OPHTHALMIC | Status: AC
  Administered 2014-08-23: 1 via OPHTHALMIC

## 2014-08-23 MED ORDER — TIMOLOL MALEATE 0.5 % OP SOLN
OPHTHALMIC | Status: DC | PRN
  Administered 2014-08-23: 1 [drp] via OPHTHALMIC

## 2014-08-23 MED ORDER — BRIMONIDINE TARTRATE 0.2 % OP SOLN
OPHTHALMIC | Status: DC | PRN
  Administered 2014-08-23: 1 [drp] via OPHTHALMIC

## 2014-08-23 MED ORDER — TETRACAINE HCL 0.5 % OP SOLN
1.0000 [drp] | Freq: Once | OPHTHALMIC | Status: AC
  Administered 2014-08-23: 1 [drp] via OPHTHALMIC

## 2014-08-23 SURGICAL SUPPLY — 26 items
CANNULA ANT/CHMB 27GA (MISCELLANEOUS) ×3 IMPLANT
GLOVE SURG LX 7.5 STRW (GLOVE) ×2
GLOVE SURG LX STRL 7.5 STRW (GLOVE) ×1 IMPLANT
GLOVE SURG TRIUMPH 8.0 PF LTX (GLOVE) ×3 IMPLANT
GOWN STRL REUS W/ TWL LRG LVL3 (GOWN DISPOSABLE) ×2 IMPLANT
GOWN STRL REUS W/TWL LRG LVL3 (GOWN DISPOSABLE) ×4
LENS IOL TECNIS 23.0 (Intraocular Lens) ×3 IMPLANT
LENS IOL TECNIS MONO 1P 23.0 (Intraocular Lens) ×1 IMPLANT
MARKER SKIN SURG W/RULER VIO (MISCELLANEOUS) ×3 IMPLANT
NDL RETROBULBAR .5 NSTRL (NEEDLE) IMPLANT
NEEDLE FILTER BLUNT 18X 1/2SAF (NEEDLE) ×2
NEEDLE FILTER BLUNT 18X1 1/2 (NEEDLE) ×1 IMPLANT
PACK CATARACT BRASINGTON (MISCELLANEOUS) ×3 IMPLANT
PACK EYE AFTER SURG (MISCELLANEOUS) ×3 IMPLANT
PACK OPTHALMIC (MISCELLANEOUS) ×3 IMPLANT
RING MALYGIN 7.0 (MISCELLANEOUS) IMPLANT
SUT ETHILON 10-0 CS-B-6CS-B-6 (SUTURE)
SUT VICRYL  9 0 (SUTURE)
SUT VICRYL 9 0 (SUTURE) IMPLANT
SUTURE EHLN 10-0 CS-B-6CS-B-6 (SUTURE) IMPLANT
SYR 3ML LL SCALE MARK (SYRINGE) ×3 IMPLANT
SYR 5ML LL (SYRINGE) IMPLANT
SYR TB 1ML LUER SLIP (SYRINGE) ×3 IMPLANT
WATER STERILE IRR 250ML POUR (IV SOLUTION) ×3 IMPLANT
WATER STERILE IRR 500ML POUR (IV SOLUTION) IMPLANT
WIPE NON LINTING 3.25X3.25 (MISCELLANEOUS) ×3 IMPLANT

## 2014-08-23 NOTE — Anesthesia Postprocedure Evaluation (Signed)
  Anesthesia Post-op Note  Patient: [name]  Procedure(s) Performed: Procedure(s): CATARACT EXTRACTION PHACO AND INTRAOCULAR LENS PLACEMENT (IOC) (Left)  Anesthesia type:General  Patient location: PACU  Post pain: Pain level controlled  Post assessment: Post-op Vital signs reviewed, Patient's Cardiovascular Status Stable, Respiratory Function Stable, Patent Airway and No signs of Nausea or vomiting  Post vital signs: Reviewed and stable  Last Vitals:  Filed Vitals:   08/23/14 0847  BP:   Pulse: 52  Temp:   Resp: 12    Level of consciousness: awake, alert  and patient cooperative  Complications: No apparent anesthesia complications

## 2014-08-23 NOTE — Transfer of Care (Signed)
Immediate Anesthesia Transfer of Care Note  Patient: [name]  Procedure(s) Performed: Procedure(s): CATARACT EXTRACTION PHACO AND INTRAOCULAR LENS PLACEMENT (IOC) (Left)  Patient Location: PACU  Anesthesia Type: General  Level of Consciousness: awake, alert  and patient cooperative  Airway and Oxygen Therapy: Patient Spontanous Breathing and Patient connected to supplemental oxygen  Post-op Assessment: Post-op Vital signs reviewed, Patient's Cardiovascular Status Stable, Respiratory Function Stable, Patent Airway and No signs of Nausea or vomiting  Post-op Vital Signs: Reviewed and stable  Complications: No apparent anesthesia complications

## 2014-08-23 NOTE — Anesthesia Procedure Notes (Signed)
Procedure Name: MAC Performed by: Brelee Renk Pre-anesthesia Checklist: Patient identified, Emergency Drugs available, Suction available, Timeout performed and Patient being monitored Patient Re-evaluated:Patient Re-evaluated prior to inductionOxygen Delivery Method: Nasal cannula Placement Confirmation: positive ETCO2     

## 2014-08-23 NOTE — H&P (Signed)
  The History and Physical notes were scanned in.  The patient remains stable and unchanged from the H&P.   Previous H&P reviewed, patient examined, and there are no changes.  [name] 08/23/2014 8:05 AM

## 2014-08-23 NOTE — Anesthesia Preprocedure Evaluation (Addendum)
Anesthesia Evaluation    Airway Mallampati: II  TM Distance: >3 FB Neck ROM: Full    Dental no notable dental hx. (+) Poor Dentition   Pulmonary sleep apnea , COPD breath sounds clear to auscultation  Pulmonary exam normal       Cardiovascular hypertension, + Peripheral Vascular Disease Normal cardiovascular examRhythm:Regular Rate:Normal     Neuro/Psych    GI/Hepatic hiatal hernia, GERD-  ,  Endo/Other  Hypothyroidism   Renal/GU      Musculoskeletal   Abdominal   Peds  Hematology   Anesthesia Other Findings   Reproductive/Obstetrics                            Anesthesia Physical Anesthesia Plan  ASA: III  Anesthesia Plan: General   Post-op Pain Management:    Induction: Intravenous  Airway Management Planned: Mask  Additional Equipment:   Intra-op Plan:   Post-operative Plan: Extubation in OR  Informed Consent: I have reviewed the patients History and Physical, chart, labs and discussed the procedure including the risks, benefits and alternatives for the proposed anesthesia with the patient or authorized representative who has indicated his/her understanding and acceptance.   Dental advisory given  Plan Discussed with: CRNA  Anesthesia Plan Comments:         Anesthesia Quick Evaluation

## 2014-08-23 NOTE — Op Note (Signed)
OPERATIVE NOTE  [name] [id number] 08/23/2014   PREOPERATIVE DIAGNOSIS:  Nuclear sclerotic cataract left eye. H25.12   POSTOPERATIVE DIAGNOSIS:    Nuclear sclerotic cataract left eye.     PROCEDURE:  Phacoemusification with posterior chamber intraocular lens placement of the left eye   LENS:   Implant Name Type Inv. Item Serial No. Manufacturer Lot No. LRB No. Used  LENS IMPL INTRAOC ZCB00 23.0 - [id number] Intraocular Lens LENS IMPL INTRAOC ZCB00 23.0 [id number] AMO   Left 1        ULTRASOUND TIME: 17  % of 1 minutes 2 seconds, CDE 10.9  SURGEON:  Wyonia Hough, MD   ANESTHESIA:  Topical with tetracaine drops and 2% Xylocaine jelly.   COMPLICATIONS:  None.   DESCRIPTION OF PROCEDURE:  The patient was identified in the holding room and transported to the operating room and placed in the supine position under the operating microscope.  The left eye was identified as the operative eye and it was prepped and draped in the usual sterile ophthalmic fashion.   A 1 millimeter clear-corneal paracentesis was made at the 1:30 position.  The anterior chamber was filled with Viscoat viscoelastic.  A 2.4 millimeter keratome was used to make a near-clear corneal incision at the 10:30 position.  .  A curvilinear capsulorrhexis was made with a cystotome and capsulorrhexis forceps.  Balanced salt solution was used to hydrodissect and hydrodelineate the nucleus.   Phacoemulsification was then used in stop and chop fashion to remove the lens nucleus and epinucleus.  The remaining cortex was then removed using the irrigation and aspiration handpiece. Provisc was then placed into the capsular bag to distend it for lens placement.  A lens was then injected into the capsular bag.  The remaining viscoelastic was aspirated.   Wounds were hydrated with balanced salt solution.  The anterior chamber was inflated to a physiologic pressure with balanced salt solution.  No wound leaks were noted.  Cefuroxime 0.1 ml of a 10mg /ml solution was injected into the anterior chamber for a dose of 1 mg of intracameral antibiotic at the completion of the case.   Timolol and Brimonidine drops were applied to the eye.  The patient was taken to the recovery room in stable condition without complications of anesthesia or surgery.  [name] 08/23/2014, 8:43 AM

## 2014-08-24 ENCOUNTER — Encounter: Payer: Self-pay | Admitting: Ophthalmology

## 2014-11-29 DIAGNOSIS — N390 Urinary tract infection, site not specified: Secondary | ICD-10-CM | POA: Insufficient documentation

## 2014-12-13 DIAGNOSIS — E213 Hyperparathyroidism, unspecified: Secondary | ICD-10-CM | POA: Insufficient documentation

## 2014-12-13 DIAGNOSIS — N952 Postmenopausal atrophic vaginitis: Secondary | ICD-10-CM | POA: Insufficient documentation

## 2015-01-05 ENCOUNTER — Ambulatory Visit: Payer: Medicare PPO | Admitting: Oncology

## 2015-10-03 ENCOUNTER — Emergency Department
Admission: EM | Admit: 2015-10-03 | Discharge: 2015-10-03 | Disposition: A | Discharge status: Home / Self Care | Payer: Medicare PPO | Attending: Emergency Medicine | Admitting: Emergency Medicine

## 2015-10-03 ENCOUNTER — Emergency Department: Payer: Medicare PPO

## 2015-10-03 ENCOUNTER — Encounter: Payer: Self-pay | Admitting: Emergency Medicine

## 2015-10-03 DIAGNOSIS — Z7722 Contact with and (suspected) exposure to environmental tobacco smoke (acute) (chronic): Secondary | ICD-10-CM | POA: Insufficient documentation

## 2015-10-03 DIAGNOSIS — J449 Chronic obstructive pulmonary disease, unspecified: Secondary | ICD-10-CM | POA: Diagnosis not present

## 2015-10-03 DIAGNOSIS — Z79899 Other long term (current) drug therapy: Secondary | ICD-10-CM | POA: Insufficient documentation

## 2015-10-03 DIAGNOSIS — E039 Hypothyroidism, unspecified: Secondary | ICD-10-CM | POA: Diagnosis not present

## 2015-10-03 DIAGNOSIS — I10 Essential (primary) hypertension: Secondary | ICD-10-CM | POA: Insufficient documentation

## 2015-10-03 DIAGNOSIS — I4891 Unspecified atrial fibrillation: Secondary | ICD-10-CM

## 2015-10-03 DIAGNOSIS — Z7982 Long term (current) use of aspirin: Secondary | ICD-10-CM | POA: Insufficient documentation

## 2015-10-03 DIAGNOSIS — J45909 Unspecified asthma, uncomplicated: Secondary | ICD-10-CM | POA: Diagnosis not present

## 2015-10-03 DIAGNOSIS — I499 Cardiac arrhythmia, unspecified: Secondary | ICD-10-CM | POA: Diagnosis present

## 2015-10-03 LAB — COMPREHENSIVE METABOLIC PANEL
ALBUMIN: 3.5 g/dL (ref 3.5–5.0)
ALT: 12 U/L — ABNORMAL LOW (ref 14–54)
ANION GAP: 6 (ref 5–15)
AST: 16 U/L (ref 15–41)
Alkaline Phosphatase: 59 U/L (ref 38–126)
BUN: 23 mg/dL — ABNORMAL HIGH (ref 6–20)
CHLORIDE: 106 mmol/L (ref 101–111)
CO2: 27 mmol/L (ref 22–32)
Calcium: 10.2 mg/dL (ref 8.9–10.3)
Creatinine, Ser: 1.09 mg/dL — ABNORMAL HIGH (ref 0.44–1.00)
GFR calc Af Amer: 53 mL/min — ABNORMAL LOW (ref >60)
GFR calc non Af Amer: 46 mL/min — ABNORMAL LOW (ref >60)
Glucose, Bld: 92 mg/dL (ref 65–99)
Potassium: 4 mmol/L (ref 3.5–5.1)
Sodium: 139 mmol/L (ref 135–145)
Total Bilirubin: 0.6 mg/dL (ref 0.3–1.2)
Total Protein: 6.8 g/dL (ref 6.5–8.1)

## 2015-10-03 LAB — CBC WITH DIFFERENTIAL/PLATELET
BASOS PCT: 1 %
Basophils Absolute: 0.1 10*3/uL (ref 0–0.1)
Eosinophils Absolute: 0.5 10*3/uL (ref 0–0.7)
Eosinophils Relative: 5 %
HCT: 36.9 % (ref 35.0–47.0)
Hemoglobin: 11.9 g/dL — ABNORMAL LOW (ref 12.0–16.0)
LYMPHS PCT: 21 %
Lymphs Abs: 2.5 10*3/uL (ref 1.0–3.6)
MCH: 26.9 pg (ref 26.0–34.0)
MCHC: 32.2 g/dL (ref 32.0–36.0)
MCV: 83.6 fL (ref 80.0–100.0)
Monocytes Absolute: 0.9 10*3/uL (ref 0.2–0.9)
Monocytes Relative: 8 %
Neutro Abs: 8 10*3/uL — ABNORMAL HIGH (ref 1.4–6.5)
Neutrophils Relative %: 65 %
PLATELETS: 269 10*3/uL (ref 150–440)
RBC: 4.42 MIL/uL (ref 3.80–5.20)
RDW: 15.3 % — AB (ref 11.5–14.5)
WBC: 12.1 10*3/uL — ABNORMAL HIGH (ref 3.6–11.0)

## 2015-10-03 LAB — MAGNESIUM: MAGNESIUM: 1.8 mg/dL (ref 1.7–2.4)

## 2015-10-03 LAB — TROPONIN I: Troponin I: <0.03 ng/mL (ref <0.03)

## 2015-10-03 NOTE — ED Triage Notes (Signed)
Pt called EMS this morning due to mechanical fall.  EMS checked her and she was in no pain.  However, pt was noted to be in atrial fibrillation when attached to EMS monitor.  Pt does not have history of afib.  HR was also noted to be in 50's, with no beta-blocker taken.  On arrival to ED, pt in NAD, denies symptoms related to afib.  Monitor shows afib with HR 70.

## 2015-10-03 NOTE — ED Provider Notes (Signed)
Time Seen: Approximately 1221  I have reviewed the triage notes  Chief Complaint: Irregular Heart Beat   History of Present Illness: [name] is a 82 y.o. female who presents after a mechanical fall. Patient had EMS notified after she fell off of the chair she was swatting at a fall. She landed primarily on her lower back pain and buttock region. Since it was noted Williamson have atrial fibrillation per EMS monitor and contact was made with her cardiologist office and the patient's never had a history of atrial fibrillation. Patient was not aware of any arrhythmias and denies any history of chest pain or heart palpitations. She denies any near syncopal symptoms and states actually she feels fine and would not be here and lasted her family was insistent that she come Williamson be evaluated for a fall. She denies any head trauma or loss of consciousness. She denies any cervical thoracic discomfort and points mainly Williamson the right hip region as the source of discomfort. She has been able Williamson bear weight on her extremity she has a known history of peripheral edema with skin ulcerations, etc.   Past Medical History:  Diagnosis Date  . Arthritis   . Asthma   . COPD (chronic obstructive pulmonary disease) ([place])   . Cough   . GERD (gastroesophageal reflux disease)   . Gout   . Hiatal hernia   . Hypertension   . Hypothyroidism   . Neuropathic pain of right lower extremity   . Peripheral vascular disease ([place])    "poor circulation"  . Seasonal allergies   . Shortness of breath dyspnea   . Sleep apnea    uses CPAP (sometimes)    There are no active problems Williamson display for this patient.   Past Surgical History:  Procedure Laterality Date  . ANKLE ARTHROSCOPY    . BACK SURGERY     L4-L5 Decompression  . CARDIAC CATHETERIZATION  1995  . CATARACT EXTRACTION W/PHACO Right 07/19/2014   Procedure: CATARACT EXTRACTION PHACO AND INTRAOCULAR LENS PLACEMENT (IOC);  Surgeon: Leandrew Koyanagi, MD;   Location: [place];  Service: Ophthalmology;  Laterality: Right;  . CATARACT EXTRACTION W/PHACO Left 08/23/2014   Procedure: CATARACT EXTRACTION PHACO AND INTRAOCULAR LENS PLACEMENT (IOC);  Surgeon: Leandrew Koyanagi, MD;  Location: [facility];  Service: Ophthalmology;  Laterality: Left;  . CHOLECYSTECTOMY    . COLONOSCOPY    . PARATHYROIDECTOMY  2003  . TONSILLECTOMY    . UVULOPALATOPHARYNGOPLASTY      Past Surgical History:  Procedure Laterality Date  . ANKLE ARTHROSCOPY    . BACK SURGERY     L4-L5 Decompression  . CARDIAC CATHETERIZATION  1995  . CATARACT EXTRACTION W/PHACO Right 07/19/2014   Procedure: CATARACT EXTRACTION PHACO AND INTRAOCULAR LENS PLACEMENT (IOC);  Surgeon: Leandrew Koyanagi, MD;  Location: [place];  Service: Ophthalmology;  Laterality: Right;  . CATARACT EXTRACTION W/PHACO Left 08/23/2014   Procedure: CATARACT EXTRACTION PHACO AND INTRAOCULAR LENS PLACEMENT (IOC);  Surgeon: Leandrew Koyanagi, MD;  Location: [facility];  Service: Ophthalmology;  Laterality: Left;  . CHOLECYSTECTOMY    . COLONOSCOPY    . PARATHYROIDECTOMY  2003  . TONSILLECTOMY    . UVULOPALATOPHARYNGOPLASTY      Current Outpatient Rx  . Order #: [id number] Class: Historical Med  . Order #: [id number] Class: Historical Med  . Order #: [id number] Class: Historical Med  . Order #: [id number] Class: Historical Med  . Order #: [id number] Class: Historical Med  . Order #: [id number] Class: Historical Med  .  Order #: [id number] Class: Historical Med  . Order #: [id number] Class: Historical Med  . Order #: [id number] Class: Historical Med  . Order #: [id number] Class: Historical Med  . Order #: [id number] Class: Historical Med  . Order #: [id number] Class: Historical Med    Allergies:  Sulfa antibiotics  Family History: No family history on file.  Social History: Social History  Substance Use Topics  . Smoking status: Passive Smoke Exposure - Never Smoker  .  Smokeless tobacco: Never Used  . Alcohol use No     Review of Systems:   10 point review of systems was performed and was otherwise negative:  Constitutional: No fever Eyes: No visual disturbances ENT: No sore throat, ear pain Cardiac: No chest pain Respiratory: No shortness of breath, wheezing, or stridor Abdomen: No abdominal pain, no vomiting, No diarrhea Endocrine: No weight loss, No night sweats Extremities: Stable peripheral edema Skin: No rashes, easy bruising Neurologic: No focal weakness, trouble with speech or swollowing Urologic: No dysuria, Hematuria, or urinary frequency  Physical Exam:  ED Triage Vitals  Enc Vitals Group     BP 10/03/15 1231 (!) 161/88     Pulse Rate 10/03/15 1236 70     Resp 10/03/15 1231 15     Temp 10/03/15 1236 98.1 F (36.7 C)     Temp Source 10/03/15 1236 Oral     SpO2 10/03/15 1236 98 %     Weight 10/03/15 1236 281 lb (127.5 kg)     Height 10/03/15 1236 5\' 4"  ([id number] m)     Head Circumference --      Peak Flow --      Pain Score 10/03/15 1237 5     Pain Loc --      Pain Edu? --      Excl. in [place]? --     General: Awake , Alert , and Oriented times 3; GCS 15 Head: Normal cephalic , atraumatic Eyes: Pupils equal , round, reactive Williamson light Nose/Throat: No nasal drainage, patent upper airway without erythema or exudate.  Neck: Supple, Full range of motion, No anterior adenopathy or palpable thyroid masses Lungs: Clear Williamson ascultation without wheezes , rhonchi, or rales Heart: Regular rate, irregular without murmurs , gallops , or rubs Abdomen: Soft, non tender without rebound, guarding , or rigidity; bowel sounds positive and symmetric in all 4 quadrants. No organomegaly .        Extremities patient has some mild tenderness Williamson deep palpation over the right hip region. Previous peripheral edema and lymphedema  Neurologic: normal ambulation, Motor symmetric without deficits, sensory intact Skin: warm, dry, no rashes No reproducible  cervical, thoracic, or midline lumbar spine discomfort  Labs:   All laboratory work was reviewed including any pertinent negatives or positives listed below:  Labs Reviewed  COMPREHENSIVE METABOLIC PANEL - Abnormal; Notable for the following:       Result Value   BUN 23 (*)    Creatinine, Ser 1.09 (*)    ALT 12 (*)    GFR calc non Af Amer 46 (*)    GFR calc Af Amer 53 (*)    All other components within normal limits  CBC WITH DIFFERENTIAL/PLATELET - Abnormal; Notable for the following:    WBC 12.1 (*)    Hemoglobin 11.9 (*)    RDW 15.3 (*)    Neutro Abs 8.0 (*)    All other components within normal limits  MAGNESIUM  TROPONIN I  Laboratory work was reviewed and showed no clinically significant abnormalities.  EKG: ED ECG REPORT I, Daymon Larsen, the attending physician, personally viewed and interpreted this ECG.  Date: 10/03/2015 EKG Time: 1228 Rate: 67 Rhythm: normal sinus rhythm versus atrial fibrillation with poorly visible P waves QRS Axis: normal Intervals: normal ST/T Wave abnormalities: normal Conduction Disturbances: none Narrative Interpretation: unremarkable    Radiology:   CLINICAL DATA:  Atrial fibrillation.  Patient fell today.  EXAM: CHEST  2 VIEW  COMPARISON:  Chest x-rays dated 01/19/2013 and 12/10/2011  FINDINGS: Heart size and pulmonary vascularity are within normal limits considering the AP technique. Lungs are clear. Slight chronic elevation left hemidiaphragm. No effusions. Accentuation of the thoracic kyphosis. No acute bone abnormality.  IMPRESSION: No active cardiopulmonary disease.   Electronically Signed   By: Lorriane Shire M.D.   On: 10/03/2015 13:52  I personally reviewed the radiologic studies CLINICAL DATA:  Fall today landing on the right side  EXAM: LUMBAR SPINE - 2-3 VIEW  COMPARISON:  Lumbar spine MRI 10/21/2009  FINDINGS: Lateral imaging is limited by obliquity.  Angulation of the lower sacrum  with disrupted cortex. No comparison imaging.  Progressed and severe L4-5 disc degeneration with complete disc narrowing. There is chronic grade 1 anterolisthesis at this level with severe facet arthropathy.  Marked osteopenia.  IMPRESSION: 1. Age-indeterminate nondisplaced inferior sacral fracture, correlate for point tenderness. 2. Severe L4-5 facet and disc degeneration with anterolisthesis.   Electronically Signed   By: Monte Fantasia M.D.   On: 10/03/2015 13:54    ED Course:  The EKG is difficult Williamson tell whether or not this is a normal sinus rhythm or more likely this is new onset atrial fibrillation. His otherwise hemodynamically stable and seems Williamson have normal rate control without being on any antiarrhythmic medications. It is unknown how long she's been in atrial fibrillation and I felt hospitalization was not necessary at this time. Follow-up was arranged with her cardiologist Dr. Ubaldo Glassing. The patient may have a new onset sacral fracture was advised take stool softeners if needed and drink plenty of fluids. She's been advised Williamson return here if she has any weakness or difficulty with ambulation, abnormal bowel movements, or difficulty with urination. Patient was advised take over-the-counter pain medication.   Clinical Course     Assessment: Atrial fibrillation  New onset with rate control Possible sacral fracture  Final Clinical Impression:   Final diagnoses:  Atrial fibrillation, unspecified type [facility])     Plan: * Outpatient Patient was advised Williamson return immediately if condition worsens. Patient was advised Williamson follow up with their primary care physician or other specialized physicians involved in their outpatient care. The patient and/or family member/power of attorney had laboratory results reviewed at the bedside. All questions and concerns were addressed and appropriate discharge instructions were distributed by the nursing staff.            Daymon Larsen, MD 10/03/15 [phone]

## 2015-10-04 DIAGNOSIS — I4811 Longstanding persistent atrial fibrillation: Secondary | ICD-10-CM | POA: Insufficient documentation

## 2015-12-07 ENCOUNTER — Emergency Department: Payer: Medicare PPO

## 2015-12-07 ENCOUNTER — Emergency Department
Admission: EM | Admit: 2015-12-07 | Discharge: 2015-12-07 | Disposition: A | Discharge status: Home / Self Care | Payer: Medicare PPO | Attending: Emergency Medicine | Admitting: Emergency Medicine

## 2015-12-07 DIAGNOSIS — E039 Hypothyroidism, unspecified: Secondary | ICD-10-CM | POA: Diagnosis not present

## 2015-12-07 DIAGNOSIS — Z79899 Other long term (current) drug therapy: Secondary | ICD-10-CM | POA: Insufficient documentation

## 2015-12-07 DIAGNOSIS — I110 Hypertensive heart disease with heart failure: Secondary | ICD-10-CM | POA: Diagnosis not present

## 2015-12-07 DIAGNOSIS — Z7982 Long term (current) use of aspirin: Secondary | ICD-10-CM | POA: Diagnosis not present

## 2015-12-07 DIAGNOSIS — R609 Edema, unspecified: Secondary | ICD-10-CM | POA: Diagnosis not present

## 2015-12-07 DIAGNOSIS — Z7722 Contact with and (suspected) exposure to environmental tobacco smoke (acute) (chronic): Secondary | ICD-10-CM | POA: Diagnosis not present

## 2015-12-07 DIAGNOSIS — M7989 Other specified soft tissue disorders: Secondary | ICD-10-CM | POA: Diagnosis present

## 2015-12-07 DIAGNOSIS — I509 Heart failure, unspecified: Secondary | ICD-10-CM | POA: Diagnosis not present

## 2015-12-07 DIAGNOSIS — J449 Chronic obstructive pulmonary disease, unspecified: Secondary | ICD-10-CM | POA: Insufficient documentation

## 2015-12-07 DIAGNOSIS — J45909 Unspecified asthma, uncomplicated: Secondary | ICD-10-CM | POA: Insufficient documentation

## 2015-12-07 HISTORY — DX: Heart failure, unspecified: I50.9

## 2015-12-07 LAB — CK: CK TOTAL: 125 U/L (ref 38–234)

## 2015-12-07 LAB — CBC WITH DIFFERENTIAL/PLATELET
BASOS ABS: 0 10*3/uL (ref 0–0.1)
Basophils Relative: 0 %
Eosinophils Absolute: 0.4 10*3/uL (ref 0–0.7)
Eosinophils Relative: 3 %
HEMATOCRIT: 38.1 % (ref 35.0–47.0)
HEMOGLOBIN: 12.5 g/dL (ref 12.0–16.0)
LYMPHS PCT: 20 %
Lymphs Abs: 2.8 10*3/uL (ref 1.0–3.6)
MCH: 26.6 pg (ref 26.0–34.0)
MCHC: 32.8 g/dL (ref 32.0–36.0)
MCV: 81.1 fL (ref 80.0–100.0)
MONO ABS: 1.4 10*3/uL — AB (ref 0.2–0.9)
Monocytes Relative: 10 %
NEUTROS ABS: 9.4 10*3/uL — AB (ref 1.4–6.5)
Neutrophils Relative %: 67 %
Platelets: 290 10*3/uL (ref 150–440)
RBC: 4.7 MIL/uL (ref 3.80–5.20)
RDW: 15 % — ABNORMAL HIGH (ref 11.5–14.5)
WBC: 14.1 10*3/uL — ABNORMAL HIGH (ref 3.6–11.0)

## 2015-12-07 LAB — URINALYSIS COMPLETE WITH MICROSCOPIC (ARMC ONLY)
Bilirubin Urine: NEGATIVE
Glucose, UA: NEGATIVE mg/dL
KETONES UR: NEGATIVE mg/dL
Nitrite: POSITIVE — AB
PROTEIN: NEGATIVE mg/dL
Specific Gravity, Urine: 1.009 (ref 1.005–1.030)
pH: 5 (ref 5.0–8.0)

## 2015-12-07 LAB — COMPREHENSIVE METABOLIC PANEL
ALK PHOS: 62 U/L (ref 38–126)
ALT: 9 U/L — AB (ref 14–54)
AST: 17 U/L (ref 15–41)
Albumin: 3.6 g/dL (ref 3.5–5.0)
Anion gap: 7 (ref 5–15)
BILIRUBIN TOTAL: 0.5 mg/dL (ref 0.3–1.2)
BUN: 26 mg/dL — AB (ref 6–20)
CALCIUM: 10 mg/dL (ref 8.9–10.3)
CO2: 27 mmol/L (ref 22–32)
Chloride: 103 mmol/L (ref 101–111)
Creatinine, Ser: 1.22 mg/dL — ABNORMAL HIGH (ref 0.44–1.00)
GFR calc Af Amer: 46 mL/min — ABNORMAL LOW (ref >60)
GFR, EST NON AFRICAN AMERICAN: 40 mL/min — AB (ref >60)
Glucose, Bld: 93 mg/dL (ref 65–99)
Potassium: 3.9 mmol/L (ref 3.5–5.1)
Sodium: 137 mmol/L (ref 135–145)
TOTAL PROTEIN: 7.4 g/dL (ref 6.5–8.1)

## 2015-12-07 MED ORDER — TRAMADOL HCL 50 MG PO TABS
50.0000 mg | ORAL_TABLET | Freq: Once | ORAL | Status: AC
  Administered 2015-12-07: 50 mg via ORAL
  Filled 2015-12-07: qty 1

## 2015-12-07 MED ORDER — CEPHALEXIN 500 MG PO CAPS: 500.0000 mg | ORAL_CAPSULE | Freq: Two times a day (BID) | ORAL | 0 refills | Status: AC

## 2015-12-07 MED ORDER — TRAMADOL HCL 50 MG PO TABS: 50.0000 mg | ORAL_TABLET | Freq: Four times a day (QID) | ORAL | 0 refills | Status: AC | PRN

## 2015-12-07 MED ORDER — BUTENAFINE HCL 1 % EX CREA: TOPICAL_CREAM | CUTANEOUS | 0 refills | Status: DC

## 2015-12-07 NOTE — ED Notes (Signed)
Dr. Quigley at bedside.  

## 2015-12-07 NOTE — ED Triage Notes (Signed)
Pt presents to ED via [facility], pt states bilateral leg swelling x 1 year, takes 80mg  lasix for CHF. Pt states home health nurse comes on Thursdays to wrap legs because of swelling and stated blisters to legs. States R leg wrap was too tight last night and leg was black/blue so pt cut wrap off R leg. L leg still wrapped. Pt color in R leg is pink now. Worried about DVT. Pt states swelling was twice as big as earlier. No pitting edema noted. Pt has sores on legs/arms/buttocks/ stomach from water blisters popping per pt.

## 2015-12-07 NOTE — ED Notes (Signed)
Went over D/C papers with pt. She verbalized understanding of papers. Waiting on family to arrive to ED to pick pt up.

## 2015-12-07 NOTE — ED Provider Notes (Signed)
Time Seen: Approximately1533 I have reviewed the triage notes  Chief Complaint: Leg Swelling   History of Present Illness: Okla Albor is a 82 y.o. female who has a long history of congestive heart failure and receives bilateral leg wrapping which she is done now for the past year. Patient was transported here by our [facility] for evaluation of right leg pain. Patient states her leg was wrapped yesterday at approximately 11 AM at the earliest and she had pain primarily in her right foot and felt like he was going to sleep throughout most of the night. She states she got to the point where she had to take the wrapping off which was approximately an hour prior to arrival. The patient states that she still has discomfort in the right foot and she was concerned that she may have a deep venous thrombosis. She denies any chest pain or shortness of breath.   Past Medical History:  Diagnosis Date  . Arthritis   . Asthma   . CHF (congestive heart failure) ([place])   . COPD (chronic obstructive pulmonary disease) ([place])   . Cough   . GERD (gastroesophageal reflux disease)   . Gout   . Hiatal hernia   . Hypertension   . Hypothyroidism   . Neuropathic pain of right lower extremity   . Peripheral vascular disease ([place])    "poor circulation"  . Seasonal allergies   . Shortness of breath dyspnea   . Sleep apnea    uses CPAP (sometimes)    There are no active problems to display for this patient.   Past Surgical History:  Procedure Laterality Date  . ANKLE ARTHROSCOPY    . BACK SURGERY     L4-L5 Decompression  . CARDIAC CATHETERIZATION  1995  . CATARACT EXTRACTION W/PHACO Right 07/19/2014   Procedure: CATARACT EXTRACTION PHACO AND INTRAOCULAR LENS PLACEMENT (IOC);  Surgeon: Leandrew Koyanagi, MD;  Location: [place];  Service: Ophthalmology;  Laterality: Right;  . CATARACT EXTRACTION W/PHACO Left 08/23/2014   Procedure: CATARACT EXTRACTION PHACO AND INTRAOCULAR LENS PLACEMENT  (IOC);  Surgeon: Leandrew Koyanagi, MD;  Location: [place];  Service: Ophthalmology;  Laterality: Left;  . CHOLECYSTECTOMY    . COLONOSCOPY    . PARATHYROIDECTOMY  2003  . TONSILLECTOMY    . UVULOPALATOPHARYNGOPLASTY      Past Surgical History:  Procedure Laterality Date  . ANKLE ARTHROSCOPY    . BACK SURGERY     L4-L5 Decompression  . CARDIAC CATHETERIZATION  1995  . CATARACT EXTRACTION W/PHACO Right 07/19/2014   Procedure: CATARACT EXTRACTION PHACO AND INTRAOCULAR LENS PLACEMENT (IOC);  Surgeon: Leandrew Koyanagi, MD;  Location: [place];  Service: Ophthalmology;  Laterality: Right;  . CATARACT EXTRACTION W/PHACO Left 08/23/2014   Procedure: CATARACT EXTRACTION PHACO AND INTRAOCULAR LENS PLACEMENT (IOC);  Surgeon: Leandrew Koyanagi, MD;  Location: [place];  Service: Ophthalmology;  Laterality: Left;  . CHOLECYSTECTOMY    . COLONOSCOPY    . PARATHYROIDECTOMY  2003  . TONSILLECTOMY    . UVULOPALATOPHARYNGOPLASTY      Current Outpatient Rx  . Order #: [id number] Class: Historical Med  . Order #: [id number] Class: Historical Med  . Order #: [id number] Class: Historical Med  . Order #: [id number] Class: Historical Med  . Order #: [id number] Class: Historical Med  . Order #: [id number] Class: Historical Med  . Order #: [id number] Class: Historical Med  . Order #: [id number] Class: Historical Med  . Order #: [id number] Class: Historical Med  . Order #: [id number] Class: Historical Med  .  Order #: [id number] Class: Historical Med  . Order #: [id number] Class: Historical Med    Allergies:  Sulfa antibiotics  Family History: History reviewed. No pertinent family history.  Social History: Social History  Substance Use Topics  . Smoking status: Passive Smoke Exposure - Never Smoker  . Smokeless tobacco: Never Used  . Alcohol use No     Review of Systems:   10 point review of systems was performed and was otherwise negative:  Constitutional: No  fever Eyes: No visual disturbances ENT: No sore throat, ear pain Cardiac: No chest pain Respiratory: No shortness of breath, wheezing, or stridor Abdomen: No abdominal pain, no vomiting, No diarrhea Endocrine: No weight loss, No night sweats Extremities: NoIncreased peripheral edema, cyanosis Skin: No rashes, easy bruising Neurologic: No focal weakness, trouble with speech or swollowing Urologic: No dysuria, Hematuria, or urinary frequency Her left leg is still wrapped and has no problems with her left foot  Physical Exam:  ED Triage Vitals  Enc Vitals Group     BP 12/07/15 1503 (!) 148/76     Pulse Rate 12/07/15 1503 73     Resp 12/07/15 1503 20     Temp 12/07/15 1503 98.1 F (36.7 C)     Temp Source 12/07/15 1503 Oral     SpO2 12/07/15 1503 97 %     Weight 12/07/15 1504 271 lb (122.9 kg)     Height 12/07/15 1504 5\' 4"  (1.626 m)     Head Circumference --      Peak Flow --      Pain Score 12/07/15 1505 2     Pain Loc --      Pain Edu? --      Excl. in Downs? --     General: Awake , Alert , and Oriented times 3; GCS 15 Head: Normal cephalic , atraumatic Eyes: Pupils equal , round, reactive to light Nose/Throat: No nasal drainage, patent upper airway without erythema or exudate.  Neck: Supple, Full range of motion, No anterior adenopathy or palpable thyroid masses Lungs: Clear to ascultation without wheezes , rhonchi, or rales Heart: Regular rate, regular rhythm without murmurs , gallops , or rubs Abdomen: Soft, non tender without rebound, guarding , or rigidity; bowel sounds positive and symmetric in all 4 quadrants. No organomegaly .        Extremities:Examination of the right foot shows a good dorsalis pedis pulse. She has subjective pain surrounding the right foot. She is able to move it though she states she has difficulty bearing weight due to discomfort. She denies any redness. She states the leg was wrapped all way up to the groin area. She has tenderness somewhat over  the anterior tibia Shaft region Neurologic: normal ambulation, Motor symmetric without deficits, sensory intact Skin: warm, dry, no rashes   Labs:   All laboratory work was reviewed including any pertinent negatives or positives listed below:  Labs Reviewed  [place] ([place])  CBC WITH DIFFERENTIAL/PLATELET  COMPREHENSIVE METABOLIC PANEL  CK  Patient has what appears to be a urinary tract infection  Radiology:  "US Venous Img Lower Unilateral Right  Result Date: 12/07/2015 CLINICAL DATA:  Right leg pain.  Right leg edema. EXAM: RIGHT LOWER EXTREMITY VENOUS DOPPLER ULTRASOUND TECHNIQUE: Gray-scale sonography with graded compression, as well as color Doppler and duplex ultrasound, were performed to evaluate the deep venous system from the level of the common femoral vein through the popliteal and proximal calf veins. Spectral Doppler was utilized to evaluate  flow at rest and with distal augmentation maneuvers. COMPARISON:  None. FINDINGS: Left common femoral vein is patent without thrombus. Normal compressibility, augmentation and color Doppler flow in the right common femoral vein, right femoral vein and right popliteal vein. The right saphenofemoral junction is patent. Right profunda femoral vein is patent without thrombus. Visualized right deep calf veins are patent without thrombus. IMPRESSION: Negative for deep venous thrombosis in right lower extremity. Electronically Signed   By: Markus Daft M.D.   On: 12/07/2015 16:23  "  I personally reviewed the radiologic studies  ED Course:  Patient's stay here was uneventful. She was given Ultram for pain and taken some Tylenol prior to arrival. Patient states that initially she was unable to bear weight on her foot. Able get her up at the bedside and she was able to bear weight and felt that she would be okay going home at this point. Feel that she had any signs of a compartment syndrome or vascular damage secondary  to the wrapping at this time. The feelings of pain and the fact that the pain is resolving and she has good color to her skin, etc. I felt that she was stable for discharge. She does have findings consistent with urinary tract infection will further questioning she states some frequency and burning with urination. I decided to start her on Keflex which will be given her good skin coverage along with coverage of the urine. The patient requested a fungal cream for the rash located toward her groin area. Clinical Course     Assessment: * Pain from a vascular wrap Urinary tract infection     Plan:  Outpatient " New Prescriptions   BUTENAFINE HCL (LOTRIMIN ULTRA) 1 % CREAM    Apply to rash twice a day   CEPHALEXIN (KEFLEX) 500 MG CAPSULE    Take 1 capsule (500 mg total) by mouth 2 (two) times daily.   TRAMADOL (ULTRAM) 50 MG TABLET    Take 1 tablet (50 mg total) by mouth every 6 (six) hours as needed.  "  Patient was advised to return immediately if condition worsens. Patient was advised to follow up with their primary care physician or other specialized physicians involved in their outpatient care. The patient and/or family member/power of attorney had laboratory results reviewed at the bedside. All questions and concerns were addressed and appropriate discharge instructions were distributed by the nursing staff.             Daymon Larsen, MD 12/07/15 [phone]

## 2015-12-07 NOTE — Discharge Instructions (Signed)
Please return immediately if condition worsens. Please contact her primary physician or the physician you were given for referral. If you have any specialist physicians involved in her treatment and plan please also contact them. Thank you for using [facility]. ° °

## 2015-12-20 ENCOUNTER — Telehealth (INDEPENDENT_AMBULATORY_CARE_PROVIDER_SITE_OTHER): Payer: Self-pay

## 2015-12-21 ENCOUNTER — Other Ambulatory Visit: Payer: Self-pay | Admitting: Vascular Surgery

## 2015-12-21 ENCOUNTER — Ambulatory Visit (INDEPENDENT_AMBULATORY_CARE_PROVIDER_SITE_OTHER): Payer: Self-pay | Admitting: Vascular Surgery

## 2015-12-21 ENCOUNTER — Encounter (INDEPENDENT_AMBULATORY_CARE_PROVIDER_SITE_OTHER): Payer: Medicare PPO

## 2015-12-21 DIAGNOSIS — M79605 Pain in left leg: Principal | ICD-10-CM

## 2015-12-21 DIAGNOSIS — M79604 Pain in right leg: Secondary | ICD-10-CM

## 2015-12-31 ENCOUNTER — Telehealth (INDEPENDENT_AMBULATORY_CARE_PROVIDER_SITE_OTHER): Payer: Self-pay

## 2015-12-31 NOTE — Telephone Encounter (Signed)
Christina from [facility] called stating the patient is refusing to have her Unna boots placed because she stated " her legs feel better without them".

## 2016-01-14 ENCOUNTER — Encounter (INDEPENDENT_AMBULATORY_CARE_PROVIDER_SITE_OTHER): Payer: Self-pay | Admitting: Vascular Surgery

## 2016-01-14 ENCOUNTER — Ambulatory Visit (INDEPENDENT_AMBULATORY_CARE_PROVIDER_SITE_OTHER): Payer: Medicare PPO | Admitting: Vascular Surgery

## 2016-01-14 ENCOUNTER — Ambulatory Visit (INDEPENDENT_AMBULATORY_CARE_PROVIDER_SITE_OTHER): Payer: Medicare PPO

## 2016-01-14 VITALS — BP 131/58 | HR 79 | Resp 16 | Ht 64.0 in | Wt 269.0 lb

## 2016-01-14 DIAGNOSIS — M79674 Pain in right toe(s): Secondary | ICD-10-CM | POA: Diagnosis not present

## 2016-01-14 DIAGNOSIS — I83813 Varicose veins of bilateral lower extremities with pain: Secondary | ICD-10-CM | POA: Insufficient documentation

## 2016-01-14 DIAGNOSIS — M79604 Pain in right leg: Secondary | ICD-10-CM | POA: Diagnosis not present

## 2016-01-14 DIAGNOSIS — I890 Lymphedema, not elsewhere classified: Secondary | ICD-10-CM

## 2016-01-14 DIAGNOSIS — M79675 Pain in left toe(s): Secondary | ICD-10-CM | POA: Diagnosis not present

## 2016-01-14 DIAGNOSIS — M79605 Pain in left leg: Secondary | ICD-10-CM | POA: Diagnosis not present

## 2016-01-14 NOTE — Progress Notes (Signed)
Subjective:    Patient ID: [name], female    DOB: 06/14/1930, 82 y.o.   MRN: [id number] Chief Complaint  Patient presents with  . Follow-up   Patient last seen on 10/29/15 for lower extremity swelling and pain. Patient is not wearing her compression stockings, elevating her legs or using her lymphedema pump. Patient is complaining of bilateral big toe pain, left worse than right. The patient was told in the distant past she may have gout. To rule out PAD, the patient underwent an ABI which showed Right ABI: 0.95 and Left 0.93, bilateral great toe pressure are abnormal and PPG waveform are within normal limits.    Review of Systems  Constitutional: Negative.   HENT: Negative.   Eyes: Negative.   Respiratory: Negative.   Cardiovascular:       Bilateral Big Toe Pain  Gastrointestinal: Negative.   Endocrine: Negative.   Genitourinary: Negative.   Musculoskeletal: Negative.   Skin: Negative.   Allergic/Immunologic: Negative.   Neurological: Negative.   Hematological: Negative.   Psychiatric/Behavioral: Negative.       Objective:   Physical Exam  Constitutional: She is oriented to person, place, and time. She appears well-developed and well-nourished.  Obese.  HENT:  Head: Normocephalic and atraumatic.  Right Ear: External ear normal.  Left Ear: External ear normal.  Eyes: Conjunctivae and EOM are normal. Pupils are equal, round, and reactive to light.  Neck: Normal range of motion.  Cardiovascular: Normal rate, regular rhythm and normal heart sounds.   Pulses:      Dorsalis pedis pulses are 1+ on the right side, and 1+ on the left side.       Posterior tibial pulses are 1+ on the right side, and 1+ on the left side.  Pulmonary/Chest: Effort normal and breath sounds normal.  Abdominal: Soft. Bowel sounds are normal.  Musculoskeletal: Normal range of motion. She exhibits edema (Moderate Edema (Bilateral)).  Neurological: She is alert and oriented to person, place, and  time.  Skin: Skin is warm and dry.  Psychiatric: She has a normal mood and affect. Her behavior is normal. Judgment and thought content normal.   BP (!) 131/58   Pulse 79   Resp 16   Ht 5\' 4"  (1.626 m)   Wt 269 lb (122 kg)   BMI 46.17 kg/m   Past Medical History:  Diagnosis Date  . Arthritis   . Asthma   . CHF (congestive heart failure) ([place])   . COPD (chronic obstructive pulmonary disease) ([place])   . Cough   . GERD (gastroesophageal reflux disease)   . Gout   . Hiatal hernia   . Hypertension   . Hypothyroidism   . Neuropathic pain of right lower extremity   . Peripheral vascular disease ([place])    "poor circulation"  . Seasonal allergies   . Shortness of breath dyspnea   . Sleep apnea    uses CPAP (sometimes)   Social History   Social History  . Marital status: Married    Spouse name: N/A  . Number of children: N/A  . Years of education: N/A   Occupational History  . Not on file.   Social History Main Topics  . Smoking status: Passive Smoke Exposure - Never Smoker  . Smokeless tobacco: Never Used  . Alcohol use No  . Drug use: Unknown  . Sexual activity: Not on file   Other Topics Concern  . Not on file   Social History Narrative  .  No narrative on file   Past Surgical History:  Procedure Laterality Date  . ANKLE ARTHROSCOPY    . BACK SURGERY     L4-L5 Decompression  . CARDIAC CATHETERIZATION  1995  . CATARACT EXTRACTION W/PHACO Right 07/19/2014   Procedure: CATARACT EXTRACTION PHACO AND INTRAOCULAR LENS PLACEMENT (IOC);  Surgeon: Leandrew Koyanagi, MD;  Location: [place];  Service: Ophthalmology;  Laterality: Right;  . CATARACT EXTRACTION W/PHACO Left 08/23/2014   Procedure: CATARACT EXTRACTION PHACO AND INTRAOCULAR LENS PLACEMENT (IOC);  Surgeon: Leandrew Koyanagi, MD;  Location: [place];  Service: Ophthalmology;  Laterality: Left;  . CHOLECYSTECTOMY    . COLONOSCOPY    . PARATHYROIDECTOMY  2003  . TONSILLECTOMY    .  UVULOPALATOPHARYNGOPLASTY     No family history on file.  Allergies  Allergen Reactions  . Sulfa Antibiotics Rash      Assessment & Plan:  Patient last seen on 10/29/15 for lower extremity swelling and pain. Patient is not wearing her compression stockings, elevating her legs or using her lymphedema pump. Patient is complaining of bilateral big toe pain, left worse than right. The patient was told in the distant past she may have gout. To rule out PAD, the patient underwent an ABI which showed Right ABI: 0.95 and Left 0.93, bilateral great toe pressure are abnormal and PPG waveform are within normal limits.   1. Lymphedema - Stable Patient has appointment in 12/17 for venous duplex to rule out venous disease. Encouraged daily compression, elevation and use a lymphedema pump for a hour at least twice a day.   2. Varicose veins of both lower extremities with pain - Stable Patient has appointment in 12/17 for venous duplex to rule out venous disease. Encouraged daily compression, elevation and use a lymphedema pump for a hour at least twice a day.   3. Pain in toes of both feet Most likely gout or neuropathy. ABI's within normal limits. Encouraged follow up with PCP.   Current Outpatient Prescriptions on File Prior to Visit  Medication Sig Dispense Refill  . amLODipine (NORVASC) 10 MG tablet Take 2.5 mg by mouth daily. AM     . aspirin 81 MG tablet Take 81 mg by mouth daily. AM    . Butenafine HCl (LOTRIMIN ULTRA) 1 % cream Apply to rash twice a day 24 g 0  . diphenhydrAMINE (BENADRYL) 25 mg capsule Take 25 mg by mouth 2 (two) times daily as needed.    . furosemide (LASIX) 40 MG tablet Take 40 mg by mouth 2 (two) times daily as needed.    Marland Kitchen levothyroxine (SYNTHROID, LEVOTHROID) 100 MCG tablet Take 100 mcg by mouth daily before breakfast.    . losartan (COZAAR) 100 MG tablet Take 100 mg by mouth daily. AM    . montelukast (SINGULAIR) 10 MG tablet Take 10 mg by mouth at bedtime.    . naproxen  sodium (ANAPROX) 220 MG tablet Take 220 mg by mouth 2 (two) times daily as needed.    Marland Kitchen omeprazole (PRILOSEC) 20 MG capsule Take 20 mg by mouth as needed.    Marland Kitchen PARoxetine (PAXIL) 20 MG tablet Take 20 mg by mouth daily. AM    . Phenazopyridine HCl (AZO TABS PO) Take by mouth as needed.    . Omega-3 Fatty Acids (OMEGA 3 PO) Take 1 capsule by mouth every morning. AM     . traMADol (ULTRAM) 50 MG tablet Take 1 tablet (50 mg total) by mouth every 6 (six) hours as needed. (  Patient not taking: Reported on 01/14/2016) 20 tablet 0   No current facility-administered medications on file prior to visit.     There are no Patient Instructions on file for this visit. No Follow-up on file.   Keylor Rands A Tatisha Cerino, PA-C

## 2016-02-12 ENCOUNTER — Ambulatory Visit (INDEPENDENT_AMBULATORY_CARE_PROVIDER_SITE_OTHER): Payer: Medicare PPO

## 2016-02-12 ENCOUNTER — Ambulatory Visit (INDEPENDENT_AMBULATORY_CARE_PROVIDER_SITE_OTHER): Payer: Medicare PPO | Admitting: Vascular Surgery

## 2016-02-12 ENCOUNTER — Encounter (INDEPENDENT_AMBULATORY_CARE_PROVIDER_SITE_OTHER): Payer: Self-pay | Admitting: Vascular Surgery

## 2016-02-12 VITALS — BP 141/70 | HR 65 | Resp 16 | Ht 68.0 in | Wt 274.0 lb

## 2016-02-12 DIAGNOSIS — M79674 Pain in right toe(s): Secondary | ICD-10-CM

## 2016-02-12 DIAGNOSIS — N189 Chronic kidney disease, unspecified: Secondary | ICD-10-CM

## 2016-02-12 DIAGNOSIS — M7989 Other specified soft tissue disorders: Secondary | ICD-10-CM | POA: Diagnosis not present

## 2016-02-12 DIAGNOSIS — I890 Lymphedema, not elsewhere classified: Secondary | ICD-10-CM | POA: Diagnosis not present

## 2016-02-12 DIAGNOSIS — M79675 Pain in left toe(s): Secondary | ICD-10-CM

## 2016-02-12 DIAGNOSIS — N182 Chronic kidney disease, stage 2 (mild): Secondary | ICD-10-CM | POA: Insufficient documentation

## 2016-02-12 DIAGNOSIS — I83813 Varicose veins of bilateral lower extremities with pain: Secondary | ICD-10-CM

## 2016-02-12 NOTE — Patient Instructions (Signed)
Lymphedema Introduction Lymphedema is swelling that is caused by the abnormal collection of lymph under the skin. Lymph is fluid from the tissues in your body that travels in the lymphatic system. This system is part of the immune system and includes lymph nodes and lymph vessels. The lymph vessels collect and carry the excess fluid, fats, proteins, and wastes from the tissues of the body to the bloodstream. This system also works to clean and remove bacteria and waste products from the body. Lymphedema occurs when the lymphatic system is blocked. When the lymph vessels or lymph nodes are blocked or damaged, lymph does not drain properly, causing an abnormal buildup of lymph. This leads to swelling in the arms or legs. Lymphedema cannot be cured by medicines, but various methods can be used to help reduce the swelling. What are the causes? There are two types of lymphedema. Primary lymphedema is caused by the absence or abnormality of the lymph vessel at birth. Secondary lymphedema is more common. It occurs when the lymph vessel is damaged or blocked. Common causes of lymph vessel blockage include:  Skin infection, such as cellulitis.  Infection by parasites (filariasis).  Injury.  Cancer.  Radiation therapy.  Formation of scar tissue.  Surgery. What are the signs or symptoms? Symptoms of this condition include:  Swelling of the arm or leg.  A heavy or tight feeling in the arm or leg.  Swelling of the feet, toes, or fingers. Shoes or rings may fit more tightly than before.  Redness of the skin over the affected area.  Limited movement of the affected limb.  Sensitivity to touch or discomfort in the affected limb. How is this diagnosed? This condition may be diagnosed with:  A physical exam.  Medical history.  Bioimpedance spectroscopy. In this test, painless electrical currents are used to measure fluid levels in your body.  Imaging tests, such as:  Lymphoscintigraphy. In  this test, a low dose of a radioactive substance is injected to trace the flow of lymph through the lymph vessels.  MRI.  CT scan.  Duplex ultrasound. This test uses sound waves to produce images of the vessels and the blood flow on a screen.  Lymphangiography. In this test, a contrast dye is injected into the lymph vessel to help show blockages. How is this treated? Treatment for this condition may depend on the cause. Treatment may include:  Exercise. Certain exercises can help fluid move out of the affected limb.  Massage. Gentle massage of the affected limb can help move the fluid out of the area.  Compression. Various methods may be used to apply pressure to the affected limb in order to reduce the swelling.  Wearing compression stockings or sleeves on the affected limb.  Bandaging the affected limb.  Using an external pump that is attached to a sleeve that alternates between applying pressure and releasing pressure.  Surgery. This is usually only done for severe cases. For example, surgery may be done if you have trouble moving the limb or if the swelling does not get better with other treatments. If an underlying condition is causing the lymphedema, treatment for that condition is needed. For example, antibiotic medicines may be used to treat an infection. Follow these instructions at home: Activities  Exercise regularly as directed by your health care provider.  Do not sit with your legs crossed.  When possible, keep the affected limb raised (elevated) above the level of your heart.  Avoid carrying things with an arm that is  affected by lymphedema.  Remember that the affected area is more likely to become injured or infected.  Take these steps to help prevent infection:  Keep the affected area clean and dry.  Protect your skin from cuts. For example, you should use gloves while cooking or gardening. Do not walk barefoot. If you shave the affected area, use an  Copy. General instructions  Take medicines only as directed by your health care provider.  Eat a healthy diet that includes a lot of fruits and vegetables.  Do not wear tight clothes, shoes, or jewelry.  Do not use heating pads over the affected area.  Avoid having blood pressure checked on the affected limb.  Keep all follow-up visits as directed by your health care provider. This is important. Contact a health care provider if:  You continue to have swelling in your limb.  You have a fever.  You have a cut that does not heal.  You have redness or pain in the affected area.  You have new swelling in your limb that comes on suddenly.  You develop purplish spots or sores (lesions) on your limb. Get help right away if:  You have a skin rash.  You have chills or sweats.  You have shortness of breath. This information is not intended to replace advice given to you by your health care provider. Make sure you discuss any questions you have with your health care provider. Document Released: 12/08/2006 Document Revised: 10/18/2015 Document Reviewed: 01/18/2014  2017 [employer]

## 2016-02-12 NOTE — Progress Notes (Signed)
MRN : [id number]  [name] is a 82 y.o. (09-Jun-1930) female who presents with chief complaint of  Chief Complaint  Patient presents with  . Re-evaluation    Ultrasound follow up  .  History of Present Illness: Patient returns in follow up for Leg swelling. The patient still has prominent swelling of both lower extremities. Swelling was improved with several weeks of Unna boots, but remains significant. Her venous duplex shows no evidence of DVT or superficial thrombophlebitis. Her left great saphenous vein has been ablated and there is no reflux in the left small saphenous vein. The right great saphenous vein has only minimal reflux in the calf that should not be clinically significant and no right small saphenous vein reflux. Her ulcerations that were there previously have all healed. She has lost about 15 pounds which I suspect was largely fluid weight. Nonetheless, she still has prominent swelling bilaterally.    Past Medical History:  Diagnosis Date  . Arthritis   . Asthma   . CHF (congestive heart failure) ([place])   . COPD (chronic obstructive pulmonary disease) ([place])   . Cough   . GERD (gastroesophageal reflux disease)   . Gout   . Hiatal hernia   . Hypertension   . Hypothyroidism   . Neuropathic pain of right lower extremity   . Peripheral vascular disease ([place])    "poor circulation"  . Seasonal allergies   . Shortness of breath dyspnea   . Sleep apnea    uses CPAP (sometimes)    Past Surgical History:  Procedure Laterality Date  . ANKLE ARTHROSCOPY    . BACK SURGERY     L4-L5 Decompression  . CARDIAC CATHETERIZATION  1995  . CATARACT EXTRACTION W/PHACO Right 07/19/2014   Procedure: CATARACT EXTRACTION PHACO AND INTRAOCULAR LENS PLACEMENT (IOC);  Surgeon: Leandrew Koyanagi, MD;  Location: [facility];  Service: Ophthalmology;  Laterality: Right;  . CATARACT EXTRACTION W/PHACO Left 08/23/2014   Procedure: CATARACT EXTRACTION PHACO AND INTRAOCULAR LENS  PLACEMENT (IOC);  Surgeon: Leandrew Koyanagi, MD;  Location: [facility];  Service: Ophthalmology;  Laterality: Left;  . CHOLECYSTECTOMY    . COLONOSCOPY    . PARATHYROIDECTOMY  2003  . TONSILLECTOMY    . UVULOPALATOPHARYNGOPLASTY      Social History Social History  Substance Use Topics  . Smoking status: Passive Smoke Exposure - Never Smoker  . Smokeless tobacco: Never Used  . Alcohol use No    Family History No bleeding or clotting disorders  Current Outpatient Prescriptions  Medication Sig Dispense Refill  . amLODipine (NORVASC) 10 MG tablet Take 2.5 mg by mouth daily. AM     . aspirin 81 MG tablet Take 81 mg by mouth daily. AM    . Butenafine HCl (LOTRIMIN ULTRA) 1 % cream Apply to rash twice a day 24 g 0  . diphenhydrAMINE (BENADRYL) 25 mg capsule Take 25 mg by mouth 2 (two) times daily as needed.    . furosemide (LASIX) 40 MG tablet Take 40 mg by mouth 2 (two) times daily as needed.    Marland Kitchen levothyroxine (SYNTHROID, LEVOTHROID) 100 MCG tablet Take 100 mcg by mouth daily before breakfast.    . losartan (COZAAR) 100 MG tablet Take 100 mg by mouth daily. AM    . montelukast (SINGULAIR) 10 MG tablet Take 10 mg by mouth at bedtime.    . naproxen sodium (ANAPROX) 220 MG tablet Take 220 mg by mouth 2 (two) times daily as needed.    Marland Kitchen  Omega-3 Fatty Acids (OMEGA 3 PO) Take 1 capsule by mouth every morning. AM     . omeprazole (PRILOSEC) 20 MG capsule Take 20 mg by mouth as needed.    Marland Kitchen PARoxetine (PAXIL) 20 MG tablet Take 20 mg by mouth daily. AM    . Phenazopyridine HCl (AZO TABS PO) Take by mouth as needed.    . traMADol (ULTRAM) 50 MG tablet Take 1 tablet (50 mg total) by mouth every 6 (six) hours as needed. 20 tablet 0   No current facility-administered medications for this visit.     Allergies  Allergen Reactions  . Prednisone     Other reaction(s): Other (See Comments), Unknown  . Sulfa Antibiotics Rash  . Sulfacetamide Sodium Rash     REVIEW OF SYSTEMS  (Negative unless checked)  Constitutional: '[]' Weight loss  '[]' Fever  '[]' Chills Cardiac: '[]' Chest pain   '[]' Chest pressure   '[]' Palpitations   '[]' Shortness of breath when laying flat   '[]' Shortness of breath at rest   '[]' Shortness of breath with exertion. Vascular:  '[x]' Pain in legs with walking   '[]' Pain in legs at rest   '[]' Pain in legs when laying flat   '[]' Claudication   '[]' Pain in feet when walking  '[]' Pain in feet at rest  '[]' Pain in feet when laying flat   '[]' History of DVT   '[]' Phlebitis   '[x]' Swelling in legs   '[x]' Varicose veins   '[x]' Non-healing ulcers Pulmonary:   '[]' Uses home oxygen   '[]' Productive cough   '[]' Hemoptysis   '[]' Wheeze  '[]' COPD    Neurologic:  '[]' Dizziness  '[]' Blackouts   '[]' Seizures   '[]' History of stroke   '[]' History of TIA  '[]' Aphasia   '[]' Temporary blindness   '[]' Dysphagia   '[]' Weakness or numbness in arms   '[]' Weakness or numbness in legs Musculoskeletal:  '[x]' Arthritis   '[]' Joint swelling   '[x]' Joint pain   '[]' Low back pain Hematologic:  '[]' Easy bruising  '[]' Easy bleeding   '[]' Hypercoagulable state   '[]' Anemic  '[]' Thrombocytopenia Gastrointestinal:  '[]' Blood in stool   '[]' Vomiting blood  '[]' Gastroesophageal reflux/heartburn   '[]' Difficulty swallowing. Genitourinary:  '[x]' Chronic kidney disease   '[]' Difficult urination  '[]' Frequent urination  '[]' Burning with urination   '[]' Blood in urine Skin:  '[]' Rashes   '[]' Ulcers   '[]' Wounds Psychological:  '[]' History of anxiety   '[]'  History of major depression.  Physical Examination  Vitals:   02/12/16 1447  BP: (!) 141/70  Pulse: 65  Resp: 16  Weight: 274 lb (124.3 kg)  Height: '5\' 8"'  (1.727 m)   Body mass index is 41.66 kg/m. Gen:  WD/WN, NAD, appears Younger than stated age. He has lost weight clinically Head: [place]/AT, No temporalis wasting. Ear/Nose/Throat: Hearing grossly intact, dentition very poor, trachea midline Eyes: Conjunctiva clear. Sclera non-icteric Neck: Supple.  No JVD. Trachea midline Pulmonary:  Good air movement, respirations not labored, no use of accessory  muscles.  Cardiac: RRR, normal S1, S2. Vascular:  Vessel Right Left  Radial Palpable Palpable                                   Gastrointestinal: soft, non-tender/non-distended. No guarding/reflex.  Musculoskeletal: M/S 5/5 throughout.  No deformity or atrophy. 2+ right lower extremity edema and 2-3+ left lower extremity edema. Walking with a walker Neurologic: Sensation grossly intact in extremities.  Symmetrical.  Speech is fluent. Psychiatric: Judgment intact, Mood & affect appropriate for pt's clinical situation. Dermatologic: Previous ulcers have healed and no open ulcerations  of significance are present at this point Lymph : No Cervical, Axillary, or Inguinal lymphadenopathy.     Labs Recent Results (from the past 2160 hour(s))  Urinalysis complete, with microscopic ([facility] only)     Status: Abnormal   Collection Time: 12/07/15  3:04 PM  Result Value Ref Range   Color, Urine YELLOW (A) YELLOW   APPearance CLEAR (A) CLEAR   Glucose, UA NEGATIVE NEGATIVE mg/dL   Bilirubin Urine NEGATIVE NEGATIVE   Ketones, ur NEGATIVE NEGATIVE mg/dL   Specific Gravity, Urine 1.009 1.005 - 1.030   Hgb urine dipstick 3+ (A) NEGATIVE   pH 5.0 5.0 - 8.0   Protein, ur NEGATIVE NEGATIVE mg/dL   Nitrite POSITIVE (A) NEGATIVE   Leukocytes, UA 2+ (A) NEGATIVE   RBC / HPF 0-5 0 - 5 RBC/hpf   WBC, UA 6-30 0 - 5 WBC/hpf   Bacteria, UA MANY (A) NONE SEEN   Squamous Epithelial / LPF 0-5 (A) NONE SEEN   Mucous PRESENT   CBC with Differential/Platelet     Status: Abnormal   Collection Time: 12/07/15  3:04 PM  Result Value Ref Range   WBC 14.1 (H) 3.6 - 11.0 K/uL   RBC 4.70 3.80 - 5.20 MIL/uL   Hemoglobin 12.5 12.0 - 16.0 g/dL   HCT 38.1 35.0 - 47.0 %   MCV 81.1 80.0 - 100.0 fL   MCH 26.6 26.0 - 34.0 pg   MCHC 32.8 32.0 - 36.0 g/dL   RDW 15.0 (H) 11.5 - 14.5 %   Platelets 290 150 - 440 K/uL   Neutrophils Relative % 67 %   Neutro Abs 9.4 (H) 1.4 - 6.5 K/uL   Lymphocytes Relative 20 %    Lymphs Abs 2.8 1.0 - 3.6 K/uL   Monocytes Relative 10 %   Monocytes Absolute 1.4 (H) 0.2 - 0.9 K/uL   Eosinophils Relative 3 %   Eosinophils Absolute 0.4 0 - 0.7 K/uL   Basophils Relative 0 %   Basophils Absolute 0.0 0 - 0.1 K/uL  Comprehensive metabolic panel     Status: Abnormal   Collection Time: 12/07/15  3:04 PM  Result Value Ref Range   Sodium 137 135 - 145 mmol/L   Potassium 3.9 3.5 - 5.1 mmol/L   Chloride 103 101 - 111 mmol/L   CO2 27 22 - 32 mmol/L   Glucose, Bld 93 65 - 99 mg/dL   BUN 26 (H) 6 - 20 mg/dL   Creatinine, Ser 1.22 (H) 0.44 - 1.00 mg/dL   Calcium 10.0 8.9 - 10.3 mg/dL   Total Protein 7.4 6.5 - 8.1 g/dL   Albumin 3.6 3.5 - 5.0 g/dL   AST 17 15 - 41 U/L   ALT 9 (L) 14 - 54 U/L   Alkaline Phosphatase 62 38 - 126 U/L   Total Bilirubin 0.5 0.3 - 1.2 mg/dL   GFR calc non Af Amer 40 (L) >60 mL/min   GFR calc Af Amer 46 (L) >60 mL/min    Comment: (NOTE) The eGFR has been calculated using the CKD EPI equation. This calculation has not been validated in all clinical situations. eGFR's persistently <60 mL/min signify possible Chronic Kidney Disease.    Anion gap 7 5 - 15  CK     Status: None   Collection Time: 12/07/15  3:04 PM  Result Value Ref Range   Total CK 125 38 - 234 U/L    Radiology No results found.   Assessment/Plan  CKD (chronic kidney disease)  Contributes to LE swelling  Swelling of limb Much better after several weeks of Unna boots. Has been intermittently wearing her compression stockings as she has difficulty getting these on and off.  Lymphedema The patient still has prominent swelling of both lower extremities. Swelling was improved with several weeks of Unna boots, but remains significant. Her venous duplex shows no evidence of DVT or superficial thrombophlebitis. Her left great saphenous vein has been ablated and there is no reflux in the left small saphenous vein. The right great saphenous vein has only minimal reflux in the calf  that should not be clinically significant and no right small saphenous vein reflux. At this point, she does not really have significant venous disease that would benefit from intervention. I believe she has lymphedema from chronic scarring of the lymphatic channels as well as multiple other causes of her swelling. I think she would benefit from a lymphedema pump in addition to compression stockings, elevation, and increasing her activity. She has stage II lymphedema clinically. She has tried conservative measures but her swelling remains significant.    Leotis Pain, MD  02/12/2016 3:20 PM    This note was created with Dragon medical transcription system.  Any errors from dictation are purely unintentional

## 2016-02-12 NOTE — Assessment & Plan Note (Signed)
Much better after several weeks of Unna boots. Has been intermittently wearing her compression stockings as she has difficulty getting these on and off.

## 2016-02-12 NOTE — Assessment & Plan Note (Signed)
The patient still has prominent swelling of both lower extremities. Swelling was improved with several weeks of Unna boots, but remains significant. Her venous duplex shows no evidence of DVT or superficial thrombophlebitis. Her left great saphenous vein has been ablated and there is no reflux in the left small saphenous vein. The right great saphenous vein has only minimal reflux in the calf that should not be clinically significant and no right small saphenous vein reflux. At this point, she does not really have significant venous disease that would benefit from intervention. I believe she has lymphedema from chronic scarring of the lymphatic channels as well as multiple other causes of her swelling. I think she would benefit from a lymphedema pump in addition to compression stockings, elevation, and increasing her activity. She has stage II lymphedema clinically. She has tried conservative measures but her swelling remains significant.

## 2016-02-12 NOTE — Assessment & Plan Note (Signed)
Contributes to LE swelling 

## 2016-02-18 DIAGNOSIS — Z6841 Body Mass Index (BMI) 40.0 and over, adult: Secondary | ICD-10-CM | POA: Insufficient documentation

## 2016-02-18 DIAGNOSIS — G4733 Obstructive sleep apnea (adult) (pediatric): Secondary | ICD-10-CM | POA: Insufficient documentation

## 2016-03-06 ENCOUNTER — Ambulatory Visit
Admission: EM | Admit: 2016-03-06 | Discharge: 2016-03-06 | Disposition: A | Discharge status: Other Acute Inpatient Hospital | Payer: Medicare PPO | Attending: Family Medicine | Admitting: Family Medicine

## 2016-03-06 DIAGNOSIS — I4891 Unspecified atrial fibrillation: Secondary | ICD-10-CM | POA: Insufficient documentation

## 2016-03-06 DIAGNOSIS — R0602 Shortness of breath: Secondary | ICD-10-CM | POA: Diagnosis not present

## 2016-03-06 DIAGNOSIS — Z0001 Encounter for general adult medical examination with abnormal findings: Secondary | ICD-10-CM | POA: Insufficient documentation

## 2016-03-06 DIAGNOSIS — J0110 Acute frontal sinusitis, unspecified: Secondary | ICD-10-CM | POA: Insufficient documentation

## 2016-03-06 DIAGNOSIS — J111 Influenza due to unidentified influenza virus with other respiratory manifestations: Secondary | ICD-10-CM | POA: Insufficient documentation

## 2016-03-06 LAB — RAPID STREP SCREEN (MED CTR MEBANE ONLY): Streptococcus, Group A Screen (Direct): NEGATIVE

## 2016-03-06 LAB — RAPID INFLUENZA A&B ANTIGENS (ARMC ONLY): INFLUENZA A (ARMC): NEGATIVE

## 2016-03-06 LAB — RAPID INFLUENZA A&B ANTIGENS: Influenza B (ARMC): NEGATIVE

## 2016-03-06 MED ORDER — ALBUTEROL SULFATE (2.5 MG/3ML) 0.083% IN NEBU
2.5000 mg | INHALATION_SOLUTION | Freq: Once | RESPIRATORY_TRACT | Status: AC
  Administered 2016-03-06: 2.5 mg via RESPIRATORY_TRACT

## 2016-03-06 NOTE — ED Triage Notes (Addendum)
3 weeks of cough, congestion. Now with headache, sore throat,  fatigue and wheezing. Seen by PCP for same and given Mucinex. Pain 10/10

## 2016-03-06 NOTE — ED Provider Notes (Signed)
[facility] ____________________________________________  Time seen: Approximately 3: 50 PM  I have reviewed the triage vital signs and the nursing notes.   HISTORY  Chief Complaint Influenza   HPI [name] is a 83 y.o. female  patient presented with him at bedside for the complaints of 2 weeks of runny nose, nasal congestion, cough and chest congestion. Patient reports over approximately the last week, she has had intermittent wheezing, chills, body aches your patient reports that just to have complaining headaches described as frontal headaches and her sinuses feel clogged. Ports some postnasal drainage. Reports cough is primarily a dry hacking cough, but reports that she feels that there is congestion in her chest that she just cannot cough up. Patient reports that she felt like she's had fevers at home, but denies known fevers. Denies known sick contacts, however reports has been in and out of the hospital visiting her husband who is currently hospitalized.  Patient reports that she had a follow-up appointment with her cardiologist last week and was started on oral Mucinex. Patient reports Mucinex has not change her symptoms. Patient reports she does intermittently use home albuterol which improved his wheezing and shortness of breath briefly.  Patient reports that she follows with cardiology due to atrial fibrillation. Patient reports that she has intermittent A. fib and she has not been started on blood thinners. Patient reports that she does feel somewhat shortness of breath as well as she has some mid chest pressure that has been present for several days. Reports chronic bilateral lower extremity swelling, denies acute change in chronic extremity swelling. Also reports some diffuse lower abdominal tenderness.  Sherrin Daisy, MD: PCP Cardiology: Ubaldo Glassing  Past Medical History:  Diagnosis Date  . Arthritis   . Asthma   . CHF (congestive heart failure) ([place])   .  COPD (chronic obstructive pulmonary disease) ([place])   . Cough   . GERD (gastroesophageal reflux disease)   . Gout   . Hiatal hernia   . Hypertension   . Hypothyroidism   . Neuropathic pain of right lower extremity   . Peripheral vascular disease ([place])    "poor circulation"  . Seasonal allergies   . Shortness of breath dyspnea   . Sleep apnea    uses CPAP (sometimes)    Patient Active Problem List   Diagnosis Date Noted  . CKD (chronic kidney disease) 02/12/2016  . Swelling of limb 02/12/2016  . Pain in toes of both feet 01/14/2016  . Varicose veins of both lower extremities with pain 01/14/2016  . Lymphedema 01/14/2016    Past Surgical History:  Procedure Laterality Date  . ANKLE ARTHROSCOPY    . BACK SURGERY     L4-L5 Decompression  . CARDIAC CATHETERIZATION  1995  . CATARACT EXTRACTION W/PHACO Right 07/19/2014   Procedure: CATARACT EXTRACTION PHACO AND INTRAOCULAR LENS PLACEMENT (IOC);  Surgeon: Leandrew Koyanagi, MD;  Location: [place];  Service: Ophthalmology;  Laterality: Right;  . CATARACT EXTRACTION W/PHACO Left 08/23/2014   Procedure: CATARACT EXTRACTION PHACO AND INTRAOCULAR LENS PLACEMENT (IOC);  Surgeon: Leandrew Koyanagi, MD;  Location: [place];  Service: Ophthalmology;  Laterality: Left;  . CHOLECYSTECTOMY    . COLONOSCOPY    . PARATHYROIDECTOMY  2003  . TONSILLECTOMY    . UVULOPALATOPHARYNGOPLASTY      Current Outpatient Rx  . Order #: [id number] Class: Historical Med  . Order #: [id number] Class: Historical Med  . Order #: [id number] Class: Print  . Order #: [id number] Class: Historical Med  .  Order #: [id number] Class: Historical Med  . Order #: [id number] Class: Historical Med  . Order #: [id number] Class: Historical Med  . Order #: [id number] Class: Historical Med  . Order #: [id number] Class: Historical Med  . Order #: [id number] Class: Historical Med  . Order #: [id number] Class: Historical Med  . Order #: [id number] Class: Historical Med  .  Order #: [id number] Class: Historical Med  . Order #: [id number] Class: Print    No current facility-administered medications for this encounter.   Current Outpatient Prescriptions:  .  amLODipine (NORVASC) 10 MG tablet, Take 2.5 mg by mouth daily. AM , Disp: , Rfl:  .  aspirin 81 MG tablet, Take 81 mg by mouth daily. AM, Disp: , Rfl:  .  Butenafine HCl (LOTRIMIN ULTRA) 1 % cream, Apply to rash twice a day, Disp: 24 g, Rfl: 0 .  diphenhydrAMINE (BENADRYL) 25 mg capsule, Take 25 mg by mouth 2 (two) times daily as needed., Disp: , Rfl:  .  furosemide (LASIX) 40 MG tablet, Take 40 mg by mouth 2 (two) times daily as needed., Disp: , Rfl:  .  levothyroxine (SYNTHROID, LEVOTHROID) 100 MCG tablet, Take 100 mcg by mouth daily before breakfast., Disp: , Rfl:  .  losartan (COZAAR) 100 MG tablet, Take 100 mg by mouth daily. AM, Disp: , Rfl:  .  montelukast (SINGULAIR) 10 MG tablet, Take 10 mg by mouth at bedtime., Disp: , Rfl:  .  naproxen sodium (ANAPROX) 220 MG tablet, Take 220 mg by mouth 2 (two) times daily as needed., Disp: , Rfl:  .  Omega-3 Fatty Acids (OMEGA 3 PO), Take 1 capsule by mouth every morning. AM , Disp: , Rfl:  .  omeprazole (PRILOSEC) 20 MG capsule, Take 20 mg by mouth as needed., Disp: , Rfl:  .  PARoxetine (PAXIL) 20 MG tablet, Take 20 mg by mouth daily. AM, Disp: , Rfl:  .  Phenazopyridine HCl (AZO TABS PO), Take by mouth as needed., Disp: , Rfl:  .  traMADol (ULTRAM) 50 MG tablet, Take 1 tablet (50 mg total) by mouth every 6 (six) hours as needed., Disp: 20 tablet, Rfl: 0  Allergies Prednisone; Sulfa antibiotics; and Sulfacetamide sodium   family history Sister: lymphoma  Social History Social History  Substance Use Topics  . Smoking status: Passive Smoke Exposure - Never Smoker  . Smokeless tobacco: Never Used  . Alcohol use No    Review of Systems Constitutional: Denies known fevers, reports subjective fevers. Eyes: No visual changes. ENT: States some intermittent  sore throat. Cardiovascular: As above. Respiratory: As above. Gastrointestinal: No nausea, no vomiting.  No diarrhea.  No constipation. Genitourinary: Negative for dysuria. Musculoskeletal: Negative for back pain. Skin: Negative for rash. Neurological: Negative for headaches, focal weakness or numbness.  10-point ROS otherwise negative.  ____________________________________________   PHYSICAL EXAM:  VITAL SIGNS: ED Triage Vitals  Enc Vitals Group     BP 03/06/16 1438 (!) 111/47     Pulse Rate 03/06/16 1438 (!) 106     Resp 03/06/16 1438 (!) 24     Temp 03/06/16 1438 99.6 F (37.6 C)     Temp Source 03/06/16 1438 Oral     SpO2 03/06/16 1438 97 %     Weight 03/06/16 1437 278 lb (126.1 kg)     Height 03/06/16 1437 5\' 4"  (1.626 m)     Head Circumference --      Peak Flow --      Pain Score 03/06/16 1443 10     Pain Loc --  Pain Edu? --      Excl. in [place]? --    Constitutional: Alert and oriented. Well appearing and in no acute distress. Eyes: Conjunctivae are normal. PERRL. EOMI. Head: Atraumatic.Mild to moderate tenderness to palpation bilateral frontal and mild tenderness to maxillary sinuses. No swelling. No erythema.   Ears: no erythema, normal TMs bilaterally.   Nose: nasal congestion with bilateral nasal turbinate erythema and edema.   Mouth/Throat: Mucous membranes are moist.  Oropharynx non-erythematous.No tonsillar swelling or exudate.  Neck: No stridor.  No cervical spine tenderness to palpation. Hematological/Lymphatic/Immunilogical: No cervical lymphadenopathy. Cardiovascular: Normal rate, regular rhythm. Grossly normal heart sounds.  Good peripheral circulation. Respiratory: Normal respiratory effort.  No retractions. Patient with diffuse wheezes and scattered rhonchi. No focal area of consolidation. Dry intermittent cough noted in room. Good air movement.  Gastrointestinal: Minimal suprapubic tenderness to palpation, abdomen otherwise soft and nontender. Obese  abdomen. Normal Bowel sounds. No CVA tenderness. Musculoskeletal: Bilateral lower extremities 2+ pitting edema. No cervical, thoracic or lumbar tenderness to palpation.  Neurologic:  Normal speech and language.  No gait instability. Skin:  Skin is warm, dry and intact. No rash noted. Psychiatric: Mood and affect are normal. Speech and behavior are normal.  ___________________________________________   LABS (all labs ordered are listed, but only abnormal results are displayed)  Labs Reviewed  RAPID INFLUENZA A&B ANTIGENS ([facility] ONLY)  RAPID STREP SCREEN (NOT AT [facility])  CULTURE, GROUP A STREP [facility])   ____________________________________________  EKG  ED ECG REPORT I, Marylene Land, the attending provider, personally viewed and interpreted this ECG.   Date: 03/06/2016  EKG Time: 1600  Rate: 105  Rhythm:  atrial fibrillation, rate 105  Axis: left axis deviation  Intervals:none  ST&T Change: none noted  ____________________________________________  RADIOLOGY  No results found. ____________________________________________   PROCEDURES Procedures     INITIAL IMPRESSION / ASSESSMENT AND PLAN / ED COURSE  Pertinent labs & imaging results that were available during my care of the patient were reviewed by me and considered in my medical decision making (see chart for details).  Overall well appearing patient. Quick strep negative, will culture. Influenza negative. Presenting for 2 weeks of symptoms that have gradually worsened. Patient with diffuse wheezes and rhonchi. Patient also with reported history of COPD, CHF, asthma with intermittent A. fib. Patient currently sounded irregular and A. fib noted on EKG. Patient not on anticoagulants. Discussed in detail with patient and family at bedside recommend patient to be seen in further evaluated in emergency room of their choice concerned for COPD CHF exacerbation, pneumonia, and a.fib. Discussed and offered to evaluate chest x-ray  as well as laboratory studies in urgent care, patient declines and states she'll have done at ER. 2 albuterol nebulizer treatments administered in urgent care, with improved wheezing, however patient reports no improvement of her shortness of breath.   Discussed in detail with patient and family, regarding transportation to hospital, and patient request EMS transfer. EMS called. Patient stable at the time of EMS transfer.  Discussed follow up with Primary care physician this week. Discussed follow up and return parameters including no resolution or any worsening concerns. Patient verbalized understanding and agreed to plan.   ____________________________________________   FINAL CLINICAL IMPRESSION(S) / ED DIAGNOSES  Final diagnoses:  SOB (shortness of breath)  Atrial fibrillation, unspecified type ([place])  Acute frontal sinusitis, recurrence not specified     Discharge Medication List as of 03/06/2016  5:21 PM      Note: This dictation was  prepared with Dragon dictation along with smaller phrase technology. Any transcriptional errors that result from this process are unintentional.    Clinical Course       Marylene Land, NP 03/06/16 [place], NP 03/06/16 1800    Marylene Land, NP 03/06/16 2150

## 2016-03-08 DIAGNOSIS — J45909 Unspecified asthma, uncomplicated: Secondary | ICD-10-CM | POA: Insufficient documentation

## 2016-03-09 LAB — CULTURE, GROUP A STREP (THRC)

## 2016-05-16 ENCOUNTER — Telehealth (INDEPENDENT_AMBULATORY_CARE_PROVIDER_SITE_OTHER): Payer: Self-pay | Admitting: Vascular Surgery

## 2016-05-16 NOTE — Telephone Encounter (Signed)
Patient called and said that she was supposed to be getting a lymph pump but hasn't heard anything. She was last seen in December and not sure if one was ordered.

## 2016-05-22 ENCOUNTER — Ambulatory Visit
Admission: EM | Admit: 2016-05-22 | Discharge: 2016-05-22 | Disposition: A | Discharge status: Home / Self Care | Payer: Medicare PPO | Attending: Family Medicine | Admitting: Family Medicine

## 2016-05-22 ENCOUNTER — Encounter: Payer: Self-pay | Admitting: *Deleted

## 2016-05-22 DIAGNOSIS — R05 Cough: Secondary | ICD-10-CM | POA: Diagnosis not present

## 2016-05-22 DIAGNOSIS — J069 Acute upper respiratory infection, unspecified: Secondary | ICD-10-CM | POA: Diagnosis not present

## 2016-05-22 DIAGNOSIS — R059 Cough, unspecified: Secondary | ICD-10-CM

## 2016-05-22 MED ORDER — AMOXICILLIN-POT CLAVULANATE 875-125 MG PO TABS: 1.0000 | ORAL_TABLET | Freq: Two times a day (BID) | ORAL | 0 refills | Status: DC

## 2016-05-22 NOTE — ED Triage Notes (Signed)
Patient started having symptoms of productive cough and weakness days ago.  Nasal congestion also reported.

## 2016-05-22 NOTE — ED Provider Notes (Signed)
[facility]    CSN: [id number] Arrival date & time: 05/22/16  1316     History   Chief Complaint Chief Complaint  Patient presents with  . Cough  . Fatigue    HPI [name] is a 83 y.o. female.   Patient is an 83 year old white female who's had a cough now for about a week according to her friend. Going to her and her friend she had difficulty being aroused this morning she is on Atarax and other medications predicted such as tramadol me think that the comminution tramadol and Atarax may cause some trouble with arousal. She is fully awake now and best not an issue. Her concern and her friends concern is that the cough is going on for about a week she's only antibiotics she was hospitalized back in January because of apparently a poor start her tract infection. She's basically wheelchair-bound and sitting in a mobile chair now. She's used Augmentin for which is working well she is allergic to prednisone as a lot of the drugs she cannot tolerate her take. She has ongoing history of asthma COPD CHF GERD gout height of hernia hypertension neuropathy sleep apnea and seasonal allergies. She's has eye cataract surgeries,  back surgery cataract surgery cardiac catheterization cholecystectomy, tonsillectomy uvuloplasty surgery and parathyroidectomy as well. She's had a history of passive smoke and she is allergic to prednisone sulfur sulfur medications.   The history is provided by the patient and a relative. No language interpreter was used.  Cough  Cough characteristics:  Non-productive Severity:  Moderate Duration:  1 week Timing:  Constant Progression:  Worsening Chronicity:  New Context: upper respiratory infection   Relieved by:  Nothing Associated symptoms: no chest pain     Past Medical History:  Diagnosis Date  . Arthritis   . Asthma   . CHF (congestive heart failure) ([place])   . COPD (chronic obstructive pulmonary disease) ([place])   . Cough   . GERD  (gastroesophageal reflux disease)   . Gout   . Hiatal hernia   . Hypertension   . Hypothyroidism   . Neuropathic pain of right lower extremity   . Peripheral vascular disease ([place])    "poor circulation"  . Seasonal allergies   . Shortness of breath dyspnea   . Sleep apnea    uses CPAP (sometimes)    Patient Active Problem List   Diagnosis Date Noted  . CKD (chronic kidney disease) 02/12/2016  . Swelling of limb 02/12/2016  . Pain in toes of both feet 01/14/2016  . Varicose veins of both lower extremities with pain 01/14/2016  . Lymphedema 01/14/2016    Past Surgical History:  Procedure Laterality Date  . ANKLE ARTHROSCOPY    . BACK SURGERY     L4-L5 Decompression  . CARDIAC CATHETERIZATION  1995  . CATARACT EXTRACTION W/PHACO Right 07/19/2014   Procedure: CATARACT EXTRACTION PHACO AND INTRAOCULAR LENS PLACEMENT (IOC);  Surgeon: Leandrew Koyanagi, MD;  Location: [place];  Service: Ophthalmology;  Laterality: Right;  . CATARACT EXTRACTION W/PHACO Left 08/23/2014   Procedure: CATARACT EXTRACTION PHACO AND INTRAOCULAR LENS PLACEMENT (IOC);  Surgeon: Leandrew Koyanagi, MD;  Location: [place];  Service: Ophthalmology;  Laterality: Left;  . CHOLECYSTECTOMY    . COLONOSCOPY    . PARATHYROIDECTOMY  2003  . TONSILLECTOMY    . UVULOPALATOPHARYNGOPLASTY      OB History    No data available       Home Medications    Prior  to Admission medications   Medication Sig Start Date End Date Taking? Authorizing Provider  amLODipine (NORVASC) 10 MG tablet Take 2.5 mg by mouth daily. AM    Yes Historical Provider, MD  aspirin 81 MG tablet Take 81 mg by mouth daily. AM   Yes Historical Provider, MD  furosemide (LASIX) 40 MG tablet Take 40 mg by mouth 2 (two) times daily as needed.   Yes Historical Provider, MD  levothyroxine (SYNTHROID, LEVOTHROID) 100 MCG tablet Take 100 mcg by mouth daily before breakfast.   Yes Historical Provider, MD  losartan (COZAAR) 100  MG tablet Take 100 mg by mouth daily. AM   Yes Historical Provider, MD  montelukast (SINGULAIR) 10 MG tablet Take 10 mg by mouth at bedtime.   Yes Historical Provider, MD  naproxen sodium (ANAPROX) 220 MG tablet Take 220 mg by mouth 2 (two) times daily as needed.   Yes Historical Provider, MD  omeprazole (PRILOSEC) 20 MG capsule Take 20 mg by mouth as needed.   Yes Historical Provider, MD  PARoxetine (PAXIL) 20 MG tablet Take 20 mg by mouth daily. AM   Yes Historical Provider, MD  Phenazopyridine HCl (AZO TABS PO) Take by mouth as needed.   Yes Historical Provider, MD  traMADol (ULTRAM) 50 MG tablet Take 1 tablet (50 mg total) by mouth every 6 (six) hours as needed. 12/07/15 12/06/16 Yes Daymon Larsen, MD  amoxicillin-clavulanate (AUGMENTIN) 875-125 MG tablet Take 1 tablet by mouth 2 (two) times daily. 05/22/16   Frederich Cha, MD  Butenafine HCl (LOTRIMIN ULTRA) 1 % cream Apply to rash twice a day 12/07/15   Daymon Larsen, MD  diphenhydrAMINE (BENADRYL) 25 mg capsule Take 25 mg by mouth 2 (two) times daily as needed.    Historical Provider, MD  Omega-3 Fatty Acids (OMEGA 3 PO) Take 1 capsule by mouth every morning. AM     Historical Provider, MD    Family History History reviewed. No pertinent family history.  Social History Social History  Substance Use Topics  . Smoking status: Passive Smoke Exposure - Never Smoker  . Smokeless tobacco: Never Used  . Alcohol use No     Allergies   Prednisone; Sulfa antibiotics; and Sulfacetamide sodium   Review of Systems Review of Systems  Respiratory: Positive for cough.   Cardiovascular: Negative for chest pain.  Psychiatric/Behavioral: Positive for behavioral problems, confusion and sleep disturbance.  All other systems reviewed and are negative.    Physical Exam Triage Vital Signs ED Triage Vitals  Enc Vitals Group     BP 05/22/16 1329 133/75     Pulse Rate 05/22/16 1329 96     Resp 05/22/16 1329 16     Temp 05/22/16 1329 98.3 F  (36.8 C)     Temp Source 05/22/16 1329 Oral     SpO2 05/22/16 1329 97 %     Weight 05/22/16 1331 280 lb (127 kg)     Height 05/22/16 1331 5\' 3"  (1.6 m)     Head Circumference --      Peak Flow --      Pain Score --      Pain Loc --      Pain Edu? --      Excl. in [place]? --    No data found.   Updated Vital Signs BP 133/75 (BP Location: Left Wrist)   Pulse 96   Temp 98.3 F (36.8 C) (Oral)   Resp 16   Ht 5\' 3"  (1.6 m)  Wt 280 lb (127 kg)   SpO2 97%   BMI 49.60 kg/m   Visual Acuity Right Eye Distance:   Left Eye Distance:   Bilateral Distance:    Right Eye Near:   Left Eye Near:    Bilateral Near:     Physical Exam  Constitutional: She is oriented to person, place, and time. She appears well-developed.  Non-toxic appearance. She does not have a sickly appearance. She does not appear ill.  Elderly white female  HENT:  Head: Normocephalic and atraumatic.  Right Ear: Hearing, tympanic membrane, external ear and ear canal normal.  Left Ear: Hearing, tympanic membrane and ear canal normal.  Nose: Nose normal.  Mouth/Throat: Uvula is midline, oropharynx is clear and moist and mucous membranes are normal.  Neck: Neck supple.  Cardiovascular: Normal rate and regular rhythm.   Pulmonary/Chest: Effort normal.  Musculoskeletal: Normal range of motion.  Neurological: She is alert and oriented to person, place, and time.  Skin: Skin is warm.  Psychiatric: She has a normal mood and affect.  Vitals reviewed.    UC Treatments / Results  Labs (all labs ordered are listed, but only abnormal results are displayed) Labs Reviewed - No data to display  EKG  EKG Interpretation None       Radiology No results found.  Procedures Procedures (including critical care time)  Medications Ordered in UC Medications - No data to display   Initial Impression / Assessment and Plan / UC Course  I have reviewed the triage vital signs and the nursing notes.  Pertinent labs &  imaging results that were available during my care of the patient were reviewed by me and considered in my medical decision making (see chart for details).   because of her age history regarding place on Augmentin 875 one tablet twice a day for 10 days if she does well with this rate but would not add anything like Tussionex since she's had trouble with sedated medication before. Follow-up with PCP in 1-2 weeks needed    Final Clinical Impressions(s) / UC Diagnoses   Final diagnoses:  Cough  Upper respiratory tract infection, unspecified type    New Prescriptions Discharge Medication List as of 05/22/2016  2:05 PM    START taking these medications   Details  amoxicillin-clavulanate (AUGMENTIN) 875-125 MG tablet Take 1 tablet by mouth 2 (two) times daily., Starting Thu 05/22/2016, Normal         Note: This dictation was prepared with Dragon dictation along with smaller phrase technology. Any transcriptional errors that result from this process are unintentional.   Frederich Cha, MD 05/22/16 1427

## 2016-08-12 ENCOUNTER — Ambulatory Visit (INDEPENDENT_AMBULATORY_CARE_PROVIDER_SITE_OTHER): Payer: Medicare PPO | Admitting: Vascular Surgery

## 2016-09-05 ENCOUNTER — Other Ambulatory Visit: Payer: Self-pay | Admitting: Cardiology

## 2016-09-05 DIAGNOSIS — R0602 Shortness of breath: Secondary | ICD-10-CM

## 2016-09-19 ENCOUNTER — Ambulatory Visit: Payer: Medicare PPO | Attending: Cardiology

## 2016-10-17 ENCOUNTER — Ambulatory Visit: Payer: Medicare PPO | Attending: Cardiology

## 2017-03-12 DIAGNOSIS — R7302 Impaired glucose tolerance (oral): Secondary | ICD-10-CM | POA: Insufficient documentation

## 2017-03-12 DIAGNOSIS — E210 Primary hyperparathyroidism: Secondary | ICD-10-CM | POA: Insufficient documentation

## 2017-03-12 DIAGNOSIS — N183 Chronic kidney disease, stage 3 unspecified: Secondary | ICD-10-CM | POA: Insufficient documentation

## 2017-03-12 DIAGNOSIS — D351 Benign neoplasm of parathyroid gland: Secondary | ICD-10-CM | POA: Insufficient documentation

## 2017-03-12 DIAGNOSIS — Z79899 Other long term (current) drug therapy: Secondary | ICD-10-CM | POA: Insufficient documentation

## 2017-03-12 DIAGNOSIS — Z91199 Patient's noncompliance with other medical treatment and regimen due to unspecified reason: Secondary | ICD-10-CM | POA: Insufficient documentation

## 2017-03-24 ENCOUNTER — Telehealth (INDEPENDENT_AMBULATORY_CARE_PROVIDER_SITE_OTHER): Payer: Self-pay | Admitting: Vascular Surgery

## 2017-03-24 NOTE — Telephone Encounter (Signed)
Check in media, it was faxed last year. She may have to call [employer] for an answer.

## 2017-03-24 NOTE — Telephone Encounter (Signed)
As advised, provided all contact information to [facility].  Pt okay and satisfied.

## 2017-03-24 NOTE — Telephone Encounter (Signed)
New Message  Pt verbalized she is needing her lymph pump.

## 2017-03-27 ENCOUNTER — Encounter (INDEPENDENT_AMBULATORY_CARE_PROVIDER_SITE_OTHER): Payer: Self-pay | Admitting: Vascular Surgery

## 2017-03-27 ENCOUNTER — Ambulatory Visit (INDEPENDENT_AMBULATORY_CARE_PROVIDER_SITE_OTHER): Payer: Medicare PPO | Admitting: Vascular Surgery

## 2017-03-27 VITALS — BP 140/81 | HR 87 | Resp 17 | Wt 274.0 lb

## 2017-03-27 DIAGNOSIS — N189 Chronic kidney disease, unspecified: Secondary | ICD-10-CM | POA: Diagnosis not present

## 2017-03-27 DIAGNOSIS — I509 Heart failure, unspecified: Secondary | ICD-10-CM

## 2017-03-27 DIAGNOSIS — I83813 Varicose veins of bilateral lower extremities with pain: Secondary | ICD-10-CM

## 2017-03-27 DIAGNOSIS — L97921 Non-pressure chronic ulcer of unspecified part of left lower leg limited to breakdown of skin: Secondary | ICD-10-CM | POA: Diagnosis not present

## 2017-03-27 DIAGNOSIS — M7989 Other specified soft tissue disorders: Secondary | ICD-10-CM

## 2017-03-27 NOTE — Assessment & Plan Note (Signed)
Ulcerations are bilateral.  Due to worsening swelling from multiple medical comorbidities and lymphedema.  Previously has had venous disease and has had treatment in the past as well.  This is now worse and recurrent.  She will need Unna boots today and likely will need these for several weeks.  We discussed the rationale and pathophysiology of both lymphedema and compression and elevation remain extremely important.  Getting the lymphedema pump will also be helpful as well.

## 2017-03-27 NOTE — Assessment & Plan Note (Signed)
Significantly worse.  This in conjunction with the ulcerations will prompt Unna boot placements today.

## 2017-03-27 NOTE — Progress Notes (Signed)
MRN : [id number]  [name] is a 84 y.o. (05-24-1930) female who presents with chief complaint of  Chief Complaint  Patient presents with  . Leg Pain  .  History of Present Illness: Patient returns today in follow up of leg swelling and ulceration.  This is been a recurring issue for her over the years, she was last seen a little over a year she had failed to follow-up for appointments because of the health of her husband who has passed on.  The legs are very painful and heavy.  She denies any fever or chills.  On a scale of 1-10, her pain rates as high as a 9 was no clear inciting event or causative factor although she has been up on her legs more.  Stockings have not helped.  She says that she just got the pump approved but has not received this yet.  Both legs are affected in terms of ulceration and swelling  Past Medical History:  Diagnosis Date  . Arthritis   . Asthma   . CHF (congestive heart failure) ([place])   . COPD (chronic obstructive pulmonary disease) ([place])   . Cough   . GERD (gastroesophageal reflux disease)   . Gout   . Hiatal hernia   . Hypertension   . Hypothyroidism   . Neuropathic pain of right lower extremity   . Peripheral vascular disease ([place])    "poor circulation"  . Seasonal allergies   . Shortness of breath dyspnea   . Sleep apnea    uses CPAP (sometimes)         Past Surgical History:  Procedure Laterality Date  . ANKLE ARTHROSCOPY    . BACK SURGERY     L4-L5 Decompression  . CARDIAC CATHETERIZATION  1995  . CATARACT EXTRACTION W/PHACO Right 07/19/2014   Procedure: CATARACT EXTRACTION PHACO AND INTRAOCULAR LENS PLACEMENT (IOC);  Surgeon: Leandrew Koyanagi, MD;  Location: [place];  Service: Ophthalmology;  Laterality: Right;  . CATARACT EXTRACTION W/PHACO Left 08/23/2014   Procedure: CATARACT EXTRACTION PHACO AND INTRAOCULAR LENS PLACEMENT (IOC);  Surgeon: Leandrew Koyanagi, MD;  Location: [place];  Service: Ophthalmology;  Laterality: Left;  . CHOLECYSTECTOMY    . COLONOSCOPY    . PARATHYROIDECTOMY  2003  . TONSILLECTOMY    . UVULOPALATOPHARYNGOPLASTY      Social History     Social History  Substance Use Topics  . Smoking status: Passive Smoke Exposure - Never Smoker  . Smokeless tobacco: Never Used  . Alcohol use No  No IV drug use  Family History No bleeding or clotting disorders.  No autoimmune disease No aneurysms        Current Outpatient Prescriptions  Medication Sig Dispense Refill  . amLODipine (NORVASC) 10 MG tablet Take 2.5 mg by mouth daily. AM     . aspirin 81 MG tablet Take 81 mg by mouth daily. AM    . Butenafine HCl (LOTRIMIN ULTRA) 1 % cream Apply to rash twice a day 24 g 0  . diphenhydrAMINE (BENADRYL) 25 mg capsule Take 25 mg by mouth 2 (two) times daily as needed.    . furosemide (LASIX) 40 MG tablet Take 40 mg by mouth 2 (two) times daily as needed.    Marland Kitchen levothyroxine (SYNTHROID, LEVOTHROID) 100 MCG tablet Take 100 mcg by mouth daily before breakfast.    . losartan (COZAAR) 100 MG tablet Take 100 mg by mouth daily. AM    . montelukast (SINGULAIR) 10 MG  tablet Take 10 mg by mouth at bedtime.    . naproxen sodium (ANAPROX) 220 MG tablet Take 220 mg by mouth 2 (two) times daily as needed.    . Omega-3 Fatty Acids (OMEGA 3 PO) Take 1 capsule by mouth every morning. AM     . omeprazole (PRILOSEC) 20 MG capsule Take 20 mg by mouth as needed.    Marland Kitchen PARoxetine (PAXIL) 20 MG tablet Take 20 mg by mouth daily. AM    . Phenazopyridine HCl (AZO TABS PO) Take by mouth as needed.    . traMADol (ULTRAM) 50 MG tablet Take 1 tablet (50 mg total) by mouth every 6 (six) hours as needed. 20 tablet 0   No current facility-administered medications for this visit.          Allergies  Allergen Reactions  . Prednisone     Other reaction(s): Other (See Comments), Unknown  . Sulfa Antibiotics Rash  . Sulfacetamide Sodium  Rash     REVIEW OF SYSTEMS (Negative unless checked)  Constitutional: [] Weight loss  [] Fever  [] Chills Cardiac: [] Chest pain   [] Chest pressure   [] Palpitations   [] Shortness of breath when laying flat   [] Shortness of breath at rest   [x] Shortness of breath with exertion. Vascular:  [x] Pain in legs with walking   [] Pain in legs at rest   [] Pain in legs when laying flat   [] Claudication   [] Pain in feet when walking  [] Pain in feet at rest  [] Pain in feet when laying flat   [] History of DVT   [] Phlebitis   [x] Swelling in legs   [x] Varicose veins   [x] Non-healing ulcers Pulmonary:   [] Uses home oxygen   [] Productive cough   [] Hemoptysis   [] Wheeze  [x] COPD    Neurologic:  [] Dizziness  [] Blackouts   [] Seizures   [] History of stroke   [] History of TIA  [] Aphasia   [] Temporary blindness   [] Dysphagia   [] Weakness or numbness in arms   [] Weakness or numbness in legs Musculoskeletal:  [x] Arthritis   [] Joint swelling   [x] Joint pain   [] Low back pain Hematologic:  [] Easy bruising  [] Easy bleeding   [] Hypercoagulable state   [] Anemic  [] Thrombocytopenia Gastrointestinal:  [] Blood in stool   [] Vomiting blood  [] Gastroesophageal reflux/heartburn   [] Difficulty swallowing. Genitourinary:  [x] Chronic kidney disease   [] Difficult urination  [] Frequent urination  [] Burning with urination   [] Blood in urine Skin:  [] Rashes   [x] Ulcers   [x] Wounds Psychological:  [] History of anxiety   []  History of major depression.      Physical Examination  BP 140/81 (BP Location: Right Arm)   Pulse 87   Resp 17   Wt 274 lb (124.3 kg)   BMI 48.54 kg/m  Gen:  WD/WN, NAD Head: [place]/AT, No temporalis wasting. Ear/Nose/Throat: Hearing grossly intact, nares w/o erythema or drainage, dentition poor Eyes: Conjunctiva clear. Sclera non-icteric Neck: Supple.  Trachea midline Pulmonary:  Good air movement, no use of accessory muscles.  Cardiac: RRR, no JVD Vascular:  Vessel Right Left  Radial Palpable Palpable                           PT  not palpable  not palpable  DP  trace palpable  not palpable    Musculoskeletal: M/S 5/5 throughout.  No deformity or atrophy.  2+ right lower extremity edema, 2-3+ left lower extremity edema.  Superficial ulcerations are present in multiple spots on both lower extremities Neurologic:  Sensation grossly intact in extremities.  Symmetrical.  Speech is fluent.  Psychiatric: Judgment intact, Mood & affect appropriate for pt's clinical situation. Dermatologic: Several small wounds and scabs are present throughout the calf areas of both lower extremities.  No significant surrounding erythema or drainage.      Labs No results found for this or any previous visit (from the past 2160 hour(s)).  Radiology No results found.    Assessment/Plan  CHF (congestive heart failure) ([place]) Can certainly contribute to lower extremity swelling.  CKD (chronic kidney disease) Can certainly contribute to lower extremity swelling.  Swelling of limb Significantly worse.  This in conjunction with the ulcerations will prompt Unna boot placements today.  Lower extremity ulceration, left, limited to breakdown of skin ([facility]) Ulcerations are bilateral.  Due to worsening swelling from multiple medical comorbidities and lymphedema.  Previously has had venous disease and has had treatment in the past as well.  This is now worse and recurrent.  She will need Unna boots today and likely will need these for several weeks.  We discussed the rationale and pathophysiology of both lymphedema and compression and elevation remain extremely important.  Getting the lymphedema pump will also be helpful as well.    Leotis Pain, MD  03/27/2017 4:22 PM    This note was created with Dragon medical transcription system.  Any errors from dictation are purely unintentional

## 2017-03-27 NOTE — Assessment & Plan Note (Signed)
Can certainly contribute to lower extremity swelling. 

## 2017-04-06 ENCOUNTER — Telehealth (INDEPENDENT_AMBULATORY_CARE_PROVIDER_SITE_OTHER): Payer: Self-pay

## 2017-04-06 NOTE — Telephone Encounter (Signed)
April from [facility] called clarification of the referral regarding placing Unna boots on the patient. She stated the referral says left leg only, the patient has both legs wrapped and both have ulcers.  I gave a verbal to wrap her bilaterally. She also wanted verbal orders for PT, OT and social worker, I advised that she should speak with her PCP regarding those orders.

## 2017-04-28 ENCOUNTER — Ambulatory Visit (INDEPENDENT_AMBULATORY_CARE_PROVIDER_SITE_OTHER): Payer: Medicare PPO | Admitting: Vascular Surgery

## 2017-05-27 DIAGNOSIS — R2681 Unsteadiness on feet: Secondary | ICD-10-CM | POA: Insufficient documentation

## 2017-07-03 DIAGNOSIS — L282 Other prurigo: Secondary | ICD-10-CM | POA: Insufficient documentation

## 2017-07-06 ENCOUNTER — Telehealth (INDEPENDENT_AMBULATORY_CARE_PROVIDER_SITE_OTHER): Payer: Self-pay

## 2017-07-07 ENCOUNTER — Ambulatory Visit (INDEPENDENT_AMBULATORY_CARE_PROVIDER_SITE_OTHER): Payer: Medicare PPO | Admitting: Vascular Surgery

## 2017-07-07 ENCOUNTER — Encounter (INDEPENDENT_AMBULATORY_CARE_PROVIDER_SITE_OTHER): Payer: Self-pay | Admitting: Vascular Surgery

## 2017-07-07 VITALS — BP 144/74 | HR 82 | Resp 17 | Ht 68.0 in | Wt 278.0 lb

## 2017-07-07 DIAGNOSIS — R600 Localized edema: Secondary | ICD-10-CM

## 2017-07-07 DIAGNOSIS — M7989 Other specified soft tissue disorders: Secondary | ICD-10-CM

## 2017-07-07 NOTE — Telephone Encounter (Signed)
I spoke with the patient and inform her to come in the office to get unna boots but she stated that she need to speak with her daughter to see what time she can bring her and they will call back

## 2017-07-07 NOTE — Progress Notes (Signed)
History of Present Illness  There is no documented history at this time  Assessments & Plan   There are no diagnoses linked to this encounter.    Additional instructions  Subjective:  Patient presents with venous ulcer of the Bilateral lower extremity.    Procedure:  3 layer unna wrap was placed Bilateral lower extremity.   Plan:   Follow up in one week.  

## 2017-07-28 DIAGNOSIS — I872 Venous insufficiency (chronic) (peripheral): Secondary | ICD-10-CM | POA: Insufficient documentation

## 2017-12-25 ENCOUNTER — Ambulatory Visit (INDEPENDENT_AMBULATORY_CARE_PROVIDER_SITE_OTHER): Payer: Medicare PPO | Admitting: Vascular Surgery

## 2018-01-05 ENCOUNTER — Ambulatory Visit (INDEPENDENT_AMBULATORY_CARE_PROVIDER_SITE_OTHER): Payer: Medicare PPO | Admitting: Vascular Surgery

## 2018-01-15 ENCOUNTER — Ambulatory Visit (INDEPENDENT_AMBULATORY_CARE_PROVIDER_SITE_OTHER): Payer: Medicare PPO | Admitting: Vascular Surgery

## 2018-01-29 ENCOUNTER — Ambulatory Visit (INDEPENDENT_AMBULATORY_CARE_PROVIDER_SITE_OTHER): Payer: Medicare PPO | Admitting: Vascular Surgery

## 2018-10-26 ENCOUNTER — Other Ambulatory Visit: Payer: Self-pay

## 2018-10-26 DIAGNOSIS — Z20822 Contact with and (suspected) exposure to covid-19: Secondary | ICD-10-CM

## 2018-10-27 LAB — NOVEL CORONAVIRUS, NAA: SARS-CoV-2, NAA: NOT DETECTED

## 2019-03-10 DIAGNOSIS — R7302 Impaired glucose tolerance (oral): Secondary | ICD-10-CM | POA: Diagnosis not present

## 2019-03-10 DIAGNOSIS — Z23 Encounter for immunization: Secondary | ICD-10-CM | POA: Diagnosis not present

## 2019-03-10 DIAGNOSIS — I4819 Other persistent atrial fibrillation: Secondary | ICD-10-CM | POA: Diagnosis not present

## 2019-03-10 DIAGNOSIS — I872 Venous insufficiency (chronic) (peripheral): Secondary | ICD-10-CM | POA: Diagnosis not present

## 2019-03-10 DIAGNOSIS — J439 Emphysema, unspecified: Secondary | ICD-10-CM | POA: Diagnosis not present

## 2019-03-10 DIAGNOSIS — E210 Primary hyperparathyroidism: Secondary | ICD-10-CM | POA: Diagnosis not present

## 2019-03-10 DIAGNOSIS — E213 Hyperparathyroidism, unspecified: Secondary | ICD-10-CM | POA: Diagnosis not present

## 2019-03-10 DIAGNOSIS — I129 Hypertensive chronic kidney disease with stage 1 through stage 4 chronic kidney disease, or unspecified chronic kidney disease: Secondary | ICD-10-CM | POA: Diagnosis not present

## 2019-03-10 DIAGNOSIS — K219 Gastro-esophageal reflux disease without esophagitis: Secondary | ICD-10-CM | POA: Diagnosis not present

## 2019-03-10 DIAGNOSIS — E039 Hypothyroidism, unspecified: Secondary | ICD-10-CM | POA: Diagnosis not present

## 2019-03-10 DIAGNOSIS — Z79899 Other long term (current) drug therapy: Secondary | ICD-10-CM | POA: Diagnosis not present

## 2019-03-10 DIAGNOSIS — N183 Chronic kidney disease, stage 3 unspecified: Secondary | ICD-10-CM | POA: Diagnosis not present

## 2019-03-10 DIAGNOSIS — G629 Polyneuropathy, unspecified: Secondary | ICD-10-CM | POA: Diagnosis not present

## 2019-03-15 DIAGNOSIS — R0600 Dyspnea, unspecified: Secondary | ICD-10-CM | POA: Diagnosis not present

## 2019-03-15 DIAGNOSIS — J432 Centrilobular emphysema: Secondary | ICD-10-CM | POA: Diagnosis not present

## 2019-03-15 DIAGNOSIS — G4733 Obstructive sleep apnea (adult) (pediatric): Secondary | ICD-10-CM | POA: Diagnosis not present

## 2019-05-02 DIAGNOSIS — Z6841 Body Mass Index (BMI) 40.0 and over, adult: Secondary | ICD-10-CM | POA: Diagnosis not present

## 2019-05-02 DIAGNOSIS — F320 Major depressive disorder, single episode, mild: Secondary | ICD-10-CM | POA: Diagnosis not present

## 2019-05-02 DIAGNOSIS — E261 Secondary hyperaldosteronism: Secondary | ICD-10-CM | POA: Diagnosis not present

## 2019-05-02 DIAGNOSIS — D6869 Other thrombophilia: Secondary | ICD-10-CM | POA: Diagnosis not present

## 2019-05-02 DIAGNOSIS — I739 Peripheral vascular disease, unspecified: Secondary | ICD-10-CM | POA: Diagnosis not present

## 2019-05-02 DIAGNOSIS — I890 Lymphedema, not elsewhere classified: Secondary | ICD-10-CM | POA: Diagnosis not present

## 2019-05-02 DIAGNOSIS — I482 Chronic atrial fibrillation, unspecified: Secondary | ICD-10-CM | POA: Diagnosis not present

## 2019-05-02 DIAGNOSIS — I509 Heart failure, unspecified: Secondary | ICD-10-CM | POA: Diagnosis not present

## 2019-06-08 DIAGNOSIS — J4520 Mild intermittent asthma, uncomplicated: Secondary | ICD-10-CM | POA: Diagnosis not present

## 2019-06-08 DIAGNOSIS — G4733 Obstructive sleep apnea (adult) (pediatric): Secondary | ICD-10-CM | POA: Diagnosis not present

## 2019-07-12 DIAGNOSIS — G4733 Obstructive sleep apnea (adult) (pediatric): Secondary | ICD-10-CM | POA: Diagnosis not present

## 2019-07-12 DIAGNOSIS — R0600 Dyspnea, unspecified: Secondary | ICD-10-CM | POA: Diagnosis not present

## 2019-07-12 DIAGNOSIS — R519 Headache, unspecified: Secondary | ICD-10-CM | POA: Diagnosis not present

## 2019-07-12 DIAGNOSIS — J4520 Mild intermittent asthma, uncomplicated: Secondary | ICD-10-CM | POA: Diagnosis not present

## 2019-07-18 DIAGNOSIS — G603 Idiopathic progressive neuropathy: Secondary | ICD-10-CM | POA: Diagnosis not present

## 2019-07-18 DIAGNOSIS — E531 Pyridoxine deficiency: Secondary | ICD-10-CM | POA: Diagnosis not present

## 2019-07-18 DIAGNOSIS — E538 Deficiency of other specified B group vitamins: Secondary | ICD-10-CM | POA: Diagnosis not present

## 2019-07-18 DIAGNOSIS — E519 Thiamine deficiency, unspecified: Secondary | ICD-10-CM | POA: Diagnosis not present

## 2019-07-18 DIAGNOSIS — E559 Vitamin D deficiency, unspecified: Secondary | ICD-10-CM | POA: Diagnosis not present

## 2019-08-01 DIAGNOSIS — I10 Essential (primary) hypertension: Secondary | ICD-10-CM | POA: Diagnosis not present

## 2019-08-01 DIAGNOSIS — E782 Mixed hyperlipidemia: Secondary | ICD-10-CM | POA: Diagnosis not present

## 2019-08-01 DIAGNOSIS — J439 Emphysema, unspecified: Secondary | ICD-10-CM | POA: Diagnosis not present

## 2019-08-01 DIAGNOSIS — I4811 Longstanding persistent atrial fibrillation: Secondary | ICD-10-CM | POA: Diagnosis not present

## 2019-08-01 DIAGNOSIS — G4733 Obstructive sleep apnea (adult) (pediatric): Secondary | ICD-10-CM | POA: Diagnosis not present

## 2019-08-23 DIAGNOSIS — K219 Gastro-esophageal reflux disease without esophagitis: Secondary | ICD-10-CM | POA: Diagnosis not present

## 2019-08-23 DIAGNOSIS — J449 Chronic obstructive pulmonary disease, unspecified: Secondary | ICD-10-CM | POA: Diagnosis not present

## 2019-08-23 DIAGNOSIS — G4733 Obstructive sleep apnea (adult) (pediatric): Secondary | ICD-10-CM | POA: Diagnosis not present

## 2019-08-23 DIAGNOSIS — Z7951 Long term (current) use of inhaled steroids: Secondary | ICD-10-CM | POA: Diagnosis not present

## 2019-08-23 DIAGNOSIS — Z9989 Dependence on other enabling machines and devices: Secondary | ICD-10-CM | POA: Diagnosis not present

## 2019-09-07 DIAGNOSIS — M7989 Other specified soft tissue disorders: Secondary | ICD-10-CM | POA: Diagnosis not present

## 2019-09-07 DIAGNOSIS — G629 Polyneuropathy, unspecified: Secondary | ICD-10-CM | POA: Diagnosis not present

## 2019-09-07 DIAGNOSIS — B351 Tinea unguium: Secondary | ICD-10-CM | POA: Diagnosis not present

## 2019-09-07 DIAGNOSIS — G603 Idiopathic progressive neuropathy: Secondary | ICD-10-CM | POA: Diagnosis not present

## 2019-09-07 DIAGNOSIS — I83893 Varicose veins of bilateral lower extremities with other complications: Secondary | ICD-10-CM | POA: Diagnosis not present

## 2019-09-08 ENCOUNTER — Emergency Department
Admission: EM | Admit: 2019-09-08 | Discharge: 2019-09-08 | Disposition: A | Discharge status: Home / Self Care | Payer: Medicare HMO | Attending: Emergency Medicine | Admitting: Emergency Medicine

## 2019-09-08 ENCOUNTER — Emergency Department: Payer: Medicare HMO

## 2019-09-08 ENCOUNTER — Other Ambulatory Visit: Payer: Self-pay

## 2019-09-08 DIAGNOSIS — S0181XA Laceration without foreign body of other part of head, initial encounter: Secondary | ICD-10-CM

## 2019-09-08 DIAGNOSIS — S0990XA Unspecified injury of head, initial encounter: Secondary | ICD-10-CM | POA: Diagnosis present

## 2019-09-08 DIAGNOSIS — R58 Hemorrhage, not elsewhere classified: Secondary | ICD-10-CM | POA: Diagnosis not present

## 2019-09-08 DIAGNOSIS — I4891 Unspecified atrial fibrillation: Secondary | ICD-10-CM

## 2019-09-08 DIAGNOSIS — Y9389 Activity, other specified: Secondary | ICD-10-CM | POA: Insufficient documentation

## 2019-09-08 DIAGNOSIS — G4489 Other headache syndrome: Secondary | ICD-10-CM | POA: Diagnosis not present

## 2019-09-08 DIAGNOSIS — I509 Heart failure, unspecified: Secondary | ICD-10-CM | POA: Diagnosis not present

## 2019-09-08 DIAGNOSIS — W010XXA Fall on same level from slipping, tripping and stumbling without subsequent striking against object, initial encounter: Secondary | ICD-10-CM | POA: Insufficient documentation

## 2019-09-08 DIAGNOSIS — N189 Chronic kidney disease, unspecified: Secondary | ICD-10-CM | POA: Diagnosis not present

## 2019-09-08 DIAGNOSIS — Y9289 Other specified places as the place of occurrence of the external cause: Secondary | ICD-10-CM | POA: Insufficient documentation

## 2019-09-08 DIAGNOSIS — S199XXA Unspecified injury of neck, initial encounter: Secondary | ICD-10-CM | POA: Diagnosis not present

## 2019-09-08 DIAGNOSIS — W19XXXA Unspecified fall, initial encounter: Secondary | ICD-10-CM | POA: Diagnosis not present

## 2019-09-08 DIAGNOSIS — R000 Tachycardia, unspecified: Secondary | ICD-10-CM | POA: Diagnosis not present

## 2019-09-08 DIAGNOSIS — I130 Hypertensive heart and chronic kidney disease with heart failure and stage 1 through stage 4 chronic kidney disease, or unspecified chronic kidney disease: Secondary | ICD-10-CM | POA: Diagnosis not present

## 2019-09-08 DIAGNOSIS — E039 Hypothyroidism, unspecified: Secondary | ICD-10-CM | POA: Insufficient documentation

## 2019-09-08 DIAGNOSIS — J449 Chronic obstructive pulmonary disease, unspecified: Secondary | ICD-10-CM | POA: Diagnosis not present

## 2019-09-08 DIAGNOSIS — Z7982 Long term (current) use of aspirin: Secondary | ICD-10-CM | POA: Diagnosis not present

## 2019-09-08 DIAGNOSIS — Z79899 Other long term (current) drug therapy: Secondary | ICD-10-CM | POA: Diagnosis not present

## 2019-09-08 DIAGNOSIS — Y998 Other external cause status: Secondary | ICD-10-CM | POA: Diagnosis not present

## 2019-09-08 DIAGNOSIS — S0101XA Laceration without foreign body of scalp, initial encounter: Secondary | ICD-10-CM | POA: Diagnosis not present

## 2019-09-08 DIAGNOSIS — Z7722 Contact with and (suspected) exposure to environmental tobacco smoke (acute) (chronic): Secondary | ICD-10-CM | POA: Diagnosis not present

## 2019-09-08 LAB — URINALYSIS, COMPLETE (UACMP) WITH MICROSCOPIC
Bilirubin Urine: NEGATIVE
Glucose, UA: NEGATIVE mg/dL
Hgb urine dipstick: NEGATIVE
Ketones, ur: NEGATIVE mg/dL
Leukocytes,Ua: NEGATIVE
Nitrite: NEGATIVE
Protein, ur: NEGATIVE mg/dL
Specific Gravity, Urine: 1.003 — ABNORMAL LOW (ref 1.005–1.030)
pH: 7 (ref 5.0–8.0)

## 2019-09-08 LAB — CBC
HCT: 36.3 % (ref 36.0–46.0)
Hemoglobin: 11.7 g/dL — ABNORMAL LOW (ref 12.0–15.0)
MCH: 28.3 pg (ref 26.0–34.0)
MCHC: 32.2 g/dL (ref 30.0–36.0)
MCV: 87.7 fL (ref 80.0–100.0)
Platelets: 269 10*3/uL (ref 150–400)
RBC: 4.14 MIL/uL (ref 3.87–5.11)
RDW: 14.6 % (ref 11.5–15.5)
WBC: 11.1 10*3/uL — ABNORMAL HIGH (ref 4.0–10.5)
nRBC: 0 % (ref 0.0–0.2)

## 2019-09-08 LAB — BASIC METABOLIC PANEL
Anion gap: 9 (ref 5–15)
BUN: 26 mg/dL — ABNORMAL HIGH (ref 8–23)
CO2: 24 mmol/L (ref 22–32)
Calcium: 10.5 mg/dL — ABNORMAL HIGH (ref 8.9–10.3)
Chloride: 104 mmol/L (ref 98–111)
Creatinine, Ser: 1.05 mg/dL — ABNORMAL HIGH (ref 0.44–1.00)
GFR calc Af Amer: 56 mL/min — ABNORMAL LOW (ref >60)
GFR calc non Af Amer: 48 mL/min — ABNORMAL LOW (ref >60)
Glucose, Bld: 102 mg/dL — ABNORMAL HIGH (ref 70–99)
Potassium: 4.2 mmol/L (ref 3.5–5.1)
Sodium: 137 mmol/L (ref 135–145)

## 2019-09-08 LAB — TROPONIN I (HIGH SENSITIVITY): Troponin I (High Sensitivity): 10 ng/L (ref <18)

## 2019-09-08 MED ORDER — DILTIAZEM HCL 25 MG/5ML IV SOLN
10.0000 mg | Freq: Once | INTRAVENOUS | Status: AC
  Administered 2019-09-08: 18:00:00 10 mg via INTRAVENOUS
  Filled 2019-09-08: qty 5

## 2019-09-08 MED ORDER — LIDOCAINE HCL (PF) 1 % IJ SOLN
5.0000 mL | Freq: Once | INTRAMUSCULAR | Status: AC
  Administered 2019-09-08: 18:00:00 5 mL
  Filled 2019-09-08: qty 5

## 2019-09-08 NOTE — Discharge Instructions (Signed)
Please seek medical attention for any high fevers, chest pain, shortness of breath, change in behavior, persistent vomiting, bloody stool or any other new or concerning symptoms.  

## 2019-09-08 NOTE — ED Provider Notes (Signed)
[facility] Emergency Department Provider Note  ____________________________________________   I have reviewed the triage vital signs and the nursing notes.   HISTORY  Chief Complaint Fall   History limited by: Not Limited   HPI Blayre Papania is a 86 y.o. female who presents to the emergency department today because of concerns for head injury after a fall. The patient states she was bending over when she lost her balance and fell forward. She did hit her head and suffered a laceration to her forehead. Patient denies any loss of consciousness. She denies any chest pain or palpitations prior to the fall. She denies any recent illness or fevers. She states she has chronic leg pain and still has some discomfort in her legs although it is not worse than her baseline. Patient is on blood thinners.   Records reviewed. Per medical record review patient has a history of atrial fibrillation.  Past Medical History:  Diagnosis Date   Arthritis    Asthma    CHF (congestive heart failure) (HCC)    COPD (chronic obstructive pulmonary disease) (HCC)    Cough    GERD (gastroesophageal reflux disease)    Gout    Hiatal hernia    Hypertension    Hypothyroidism    Neuropathic pain of right lower extremity    Peripheral vascular disease ([place])    "poor circulation"   Seasonal allergies    Shortness of breath dyspnea    Sleep apnea    uses CPAP (sometimes)    Patient Active Problem List   Diagnosis Date Noted   CHF (congestive heart failure) ([place]) 03/27/2017   Lower extremity ulceration, left, limited to breakdown of skin ([place]) 03/27/2017   CKD (chronic kidney disease) 02/12/2016   Swelling of limb 02/12/2016   Pain in toes of both feet 01/14/2016   Varicose veins of both lower extremities with pain 01/14/2016   Lymphedema 01/14/2016    Past Surgical History:  Procedure Laterality Date   ANKLE ARTHROSCOPY     BACK SURGERY     L4-L5  Decompression   CARDIAC CATHETERIZATION  1995   CATARACT EXTRACTION W/PHACO Right 07/19/2014   Procedure: CATARACT EXTRACTION PHACO AND INTRAOCULAR LENS PLACEMENT ([place]);  Surgeon: Leandrew Koyanagi, MD;  Location: [place];  Service: Ophthalmology;  Laterality: Right;   CATARACT EXTRACTION W/PHACO Left 08/23/2014   Procedure: CATARACT EXTRACTION PHACO AND INTRAOCULAR LENS PLACEMENT ([facility]);  Surgeon: Leandrew Koyanagi, MD;  Location: [place];  Service: Ophthalmology;  Laterality: Left;   CHOLECYSTECTOMY     COLONOSCOPY     PARATHYROIDECTOMY  2003   TONSILLECTOMY     UVULOPALATOPHARYNGOPLASTY      Prior to Admission medications   Medication Sig Start Date End Date Taking? Authorizing Provider  acetaminophen (TYLENOL) 500 MG tablet Take by mouth.    [provider]  amLODipine (NORVASC) 10 MG tablet Take 2.5 mg by mouth daily. AM     [provider]  amoxicillin-clavulanate (AUGMENTIN) 875-125 MG tablet Take 1 tablet by mouth 2 (two) times daily. Patient not taking: Reported on 03/27/2017 05/22/16   Frederich Cha, MD  aspirin 81 MG tablet Take 81 mg by mouth daily. AM    [provider]  benzonatate (TESSALON) 100 MG capsule Take 100 mg by mouth 2 (two) times daily as needed.  03/12/17   [provider]  Butenafine HCl (LOTRIMIN ULTRA) 1 % cream Apply to rash twice a day Patient not taking: Reported on 03/27/2017 12/07/15  Daymon Larsen, MD  Calcium Ascorbate 500 MG TABS Take by mouth.    [provider]  carbamazepine (TEGRETOL) 100 MG chewable tablet Chew by mouth.    [provider]  cetirizine (ZYRTEC) 5 MG tablet Take by mouth. 06/12/17 06/12/18  [provider]  Cholecalciferol (VITAMIN D-1000 MAX ST) 1000 units tablet Take by mouth.    [provider]  diphenhydrAMINE (BENADRYL) 25 mg capsule Take 25 mg by mouth 2 (two) times daily as needed.    [provider]  furosemide  (LASIX) 20 MG tablet Take 20 mg by mouth as needed.  04/29/16   [provider]  furosemide (LASIX) 40 MG tablet Take 40 mg by mouth 2 (two) times daily as needed.    [provider]  levothyroxine (SYNTHROID, LEVOTHROID) 137 MCG tablet Take 137 mcg by mouth.  07/14/14   [provider]  losartan (COZAAR) 100 MG tablet Take 100 mg by mouth daily. AM    [provider]  montelukast (SINGULAIR) 10 MG tablet Take 10 mg by mouth at bedtime.    [provider]  Multiple Vitamin (MULTI-VITAMINS) TABS Take by mouth.    [provider]  naproxen sodium (ANAPROX) 220 MG tablet Take 220 mg by mouth 2 (two) times daily as needed.    [provider]  Omega-3 Fatty Acids (OMEGA 3 PO) Take 1 capsule by mouth every morning. AM     [provider]  omeprazole (PRILOSEC) 20 MG capsule Take 20 mg by mouth as needed.    [provider]  PARoxetine (PAXIL) 20 MG tablet Take 20 mg by mouth daily. AM    [provider]  Phenazopyridine HCl (AZO TABS PO) Take by mouth as needed.    [provider]  VENTOLIN HFA 108 (90 Base) MCG/ACT inhaler INHALE 2 PUFFS BY MOUTH EVERY 6 HOURS AS NEEDED FOR WHEEZE 04/25/17   [provider]    Allergies Prednisone, Sulfa antibiotics, and Sulfacetamide sodium  Family History  Problem Relation Age of Onset   Cancer Mother    Hyperlipidemia Son    Diabetes Son    Cancer Maternal Grandmother     Social History Social History   Tobacco Use   Smoking status: Passive Smoke Exposure - Never Smoker   Smokeless tobacco: Never Used  Substance Use Topics   Alcohol use: No   Drug use: No    Review of Systems Constitutional: No fever/chills Eyes: No visual changes. ENT: No sore throat. Cardiovascular: Denies chest pain. Respiratory: Denies shortness of breath. Gastrointestinal: No abdominal pain.  No nausea, no vomiting.  No diarrhea.   Genitourinary: Negative for  dysuria. Musculoskeletal: Negative for back pain. Skin: Positive for laceration to forehead. Neurological: Positive for headache. ____________________________________________   PHYSICAL EXAM:  VITAL SIGNS: ED Triage Vitals  Enc Vitals Group     BP 150/96     Pulse 124     Resp 20     Temp 98.2     Temp src      SpO2 98    Constitutional: Alert and oriented.  Eyes: Conjunctivae are normal.  ENT      Head: Normocephalic      Nose: No congestion/rhinnorhea.      Mouth/Throat: Mucous membranes are moist.      Neck: No stridor. No midline tenderness. Hematological/Lymphatic/Immunilogical: No cervical lymphadenopathy. Cardiovascular: Normal rate, regular rhythm.  No murmurs, rubs, or gallops.  Respiratory: Normal respiratory effort without tachypnea nor retractions.  Breath sounds are clear and equal bilaterally. No wheezes/rales/rhonchi. Gastrointestinal: Soft and non tender. No rebound. No guarding.  Genitourinary: Deferred Musculoskeletal: Normal range of motion in all extremities. No lower extremity edema. Neurologic:  Normal speech and language. No gross focal neurologic deficits are appreciated.  Skin:  Roughly 3 cm stellate laceration to forehead. Psychiatric: Mood and affect are normal. Speech and behavior are normal. Patient exhibits appropriate insight and judgment.  ____________________________________________    LABS (pertinent positives/negatives)  CBC wbc 11.1, hgb 11.7, plt 269 BMP na 137, k 4.2, glu 102, cr 1.05 Trop hs 10 UA clear, 0-5 rbc and wbc, rare bacteria, neg nitrite and leukocytes ____________________________________________   EKG  I, Nance Pear, attending physician, personally viewed and interpreted this EKG  EKG Time: 1621 Rate: 141 Rhythm: atrial fibrillation Axis: left axis deviation Intervals: qtc 471 QRS: narrow, q waves v1 ST changes: no st elevation Impression: abnormal ekg  ____________________________________________     RADIOLOGY  CT head/cervical spine No acute intracranial traumatic finding. No acute osseous injury  ____________________________________________   PROCEDURES  Procedures  LACERATION REPAIR Performed by: Nance Pear Authorized by: Nance Pear Consent: Verbal consent obtained. Risks and benefits: risks, benefits and alternatives were discussed Consent given by: patient Patient identity confirmed: provided demographic data Prepped and Draped in normal sterile fashion Wound explored  Laceration Location: forehead  Laceration Length: 3cm  No Foreign Bodies seen or palpated  Anesthesia: local infiltration  Local anesthetic: lidocaine 1% without epinephrine  Anesthetic total: 3 ml  Irrigation method: syringe Amount of cleaning: standard  Skin closure: 5-0 vicryl rapide  Number of sutures: 11  Technique: simple interrupted  Patient tolerance: Patient tolerated the procedure well with no immediate complications.  ____________________________________________   INITIAL IMPRESSION / ASSESSMENT AND PLAN / ED COURSE  Pertinent labs & imaging results that were available during my care of the patient were reviewed by me and considered in my medical decision making (see chart for details).   Presented to the emergency department today after a fall and suffering a laceration to her forehead.  Patient is on blood thinning medication.  CT head and cervical spine were obtained.  No intracranial traumatic injury or osseous injury found.  Forehead laceration was sutured closed.  Patient was found to be in A. fib with RVR upon arrival.  She was given 1 dose of diltiazem which did help control her rate to where it was below 110.  This point I do think is reasonable for patient be discharged home.  I did discuss infection return precautions. ____________________________________________   FINAL CLINICAL IMPRESSION(S) / ED DIAGNOSES  Final diagnoses:  Fall, initial  encounter  Laceration of forehead, initial encounter  Atrial fibrillation, unspecified type [facility])     Note: This dictation was prepared with Dragon dictation. Any transcriptional errors that result from this process are unintentional     Nance Pear, MD 09/08/19 1939

## 2019-09-08 NOTE — ED Notes (Signed)
Pt given a blanket and heat increased in room. Pt heart rate has decreased. Encouraged pt to continue to rest quietly to aid in reduction of heart rate. Non stick dressing applied over lac.

## 2019-09-08 NOTE — ED Triage Notes (Signed)
Pt to the ER via EMS for a fall and a head injury resulting in a laceration between the eyes on the forehead. Pt does take eliquis for a fib. Pt states she was leaning over to turn off her roomba and fell. Pt his her face/head on a bookshelf corner. Pt denies LOC. Pt does have a headache. Pt reports possible bleeding in the nose.

## 2019-09-08 NOTE — ED Notes (Signed)
Daughter to the bedside. Pt is back from CT. Pt face cleaned and lac assessed. Pt has a 1 inch lac to the middle of the forehead between the eyebrows that is jagged but not widely open. Pt has light bleeding present. No bruising noted under eyes as of yet. Area covered with saline soaked gauze.

## 2019-09-29 DIAGNOSIS — L282 Other prurigo: Secondary | ICD-10-CM | POA: Diagnosis not present

## 2019-09-29 DIAGNOSIS — E210 Primary hyperparathyroidism: Secondary | ICD-10-CM | POA: Diagnosis not present

## 2019-09-29 DIAGNOSIS — G4733 Obstructive sleep apnea (adult) (pediatric): Secondary | ICD-10-CM | POA: Diagnosis not present

## 2019-09-29 DIAGNOSIS — N183 Chronic kidney disease, stage 3 unspecified: Secondary | ICD-10-CM | POA: Diagnosis not present

## 2019-09-29 DIAGNOSIS — R7302 Impaired glucose tolerance (oral): Secondary | ICD-10-CM | POA: Diagnosis not present

## 2019-09-29 DIAGNOSIS — E039 Hypothyroidism, unspecified: Secondary | ICD-10-CM | POA: Diagnosis not present

## 2019-09-29 DIAGNOSIS — I129 Hypertensive chronic kidney disease with stage 1 through stage 4 chronic kidney disease, or unspecified chronic kidney disease: Secondary | ICD-10-CM | POA: Diagnosis not present

## 2019-09-29 DIAGNOSIS — D6859 Other primary thrombophilia: Secondary | ICD-10-CM | POA: Diagnosis not present

## 2019-09-29 DIAGNOSIS — Z0000 Encounter for general adult medical examination without abnormal findings: Secondary | ICD-10-CM | POA: Diagnosis not present

## 2019-09-29 DIAGNOSIS — I4811 Longstanding persistent atrial fibrillation: Secondary | ICD-10-CM | POA: Diagnosis not present

## 2019-09-29 DIAGNOSIS — J439 Emphysema, unspecified: Secondary | ICD-10-CM | POA: Diagnosis not present

## 2019-11-01 DIAGNOSIS — M5441 Lumbago with sciatica, right side: Secondary | ICD-10-CM | POA: Diagnosis not present

## 2019-11-01 DIAGNOSIS — Z6841 Body Mass Index (BMI) 40.0 and over, adult: Secondary | ICD-10-CM | POA: Diagnosis not present

## 2019-11-01 DIAGNOSIS — G8929 Other chronic pain: Secondary | ICD-10-CM | POA: Diagnosis not present

## 2019-11-01 DIAGNOSIS — M5442 Lumbago with sciatica, left side: Secondary | ICD-10-CM | POA: Diagnosis not present

## 2019-11-01 DIAGNOSIS — M545 Low back pain: Secondary | ICD-10-CM | POA: Diagnosis not present

## 2019-11-01 DIAGNOSIS — Z9889 Other specified postprocedural states: Secondary | ICD-10-CM | POA: Diagnosis not present

## 2019-11-11 DIAGNOSIS — I10 Essential (primary) hypertension: Secondary | ICD-10-CM | POA: Diagnosis not present

## 2019-11-11 DIAGNOSIS — J309 Allergic rhinitis, unspecified: Secondary | ICD-10-CM | POA: Diagnosis not present

## 2019-11-11 DIAGNOSIS — G629 Polyneuropathy, unspecified: Secondary | ICD-10-CM | POA: Diagnosis not present

## 2019-11-11 DIAGNOSIS — J449 Chronic obstructive pulmonary disease, unspecified: Secondary | ICD-10-CM | POA: Diagnosis not present

## 2019-11-11 DIAGNOSIS — G8929 Other chronic pain: Secondary | ICD-10-CM | POA: Diagnosis not present

## 2019-11-11 DIAGNOSIS — I872 Venous insufficiency (chronic) (peripheral): Secondary | ICD-10-CM | POA: Diagnosis not present

## 2019-11-11 DIAGNOSIS — M1990 Unspecified osteoarthritis, unspecified site: Secondary | ICD-10-CM | POA: Diagnosis not present

## 2019-11-11 DIAGNOSIS — M5442 Lumbago with sciatica, left side: Secondary | ICD-10-CM | POA: Diagnosis not present

## 2019-11-11 DIAGNOSIS — M5441 Lumbago with sciatica, right side: Secondary | ICD-10-CM | POA: Diagnosis not present

## 2019-11-15 DIAGNOSIS — J439 Emphysema, unspecified: Secondary | ICD-10-CM | POA: Diagnosis not present

## 2019-11-15 DIAGNOSIS — R05 Cough: Secondary | ICD-10-CM | POA: Diagnosis not present

## 2019-11-15 DIAGNOSIS — R0600 Dyspnea, unspecified: Secondary | ICD-10-CM | POA: Diagnosis not present

## 2019-11-15 DIAGNOSIS — G4733 Obstructive sleep apnea (adult) (pediatric): Secondary | ICD-10-CM | POA: Diagnosis not present

## 2019-11-16 DIAGNOSIS — J449 Chronic obstructive pulmonary disease, unspecified: Secondary | ICD-10-CM | POA: Diagnosis not present

## 2019-11-16 DIAGNOSIS — J309 Allergic rhinitis, unspecified: Secondary | ICD-10-CM | POA: Diagnosis not present

## 2019-11-16 DIAGNOSIS — M5441 Lumbago with sciatica, right side: Secondary | ICD-10-CM | POA: Diagnosis not present

## 2019-11-16 DIAGNOSIS — G629 Polyneuropathy, unspecified: Secondary | ICD-10-CM | POA: Diagnosis not present

## 2019-11-16 DIAGNOSIS — I10 Essential (primary) hypertension: Secondary | ICD-10-CM | POA: Diagnosis not present

## 2019-11-16 DIAGNOSIS — I872 Venous insufficiency (chronic) (peripheral): Secondary | ICD-10-CM | POA: Diagnosis not present

## 2019-11-16 DIAGNOSIS — G8929 Other chronic pain: Secondary | ICD-10-CM | POA: Diagnosis not present

## 2019-11-16 DIAGNOSIS — M5442 Lumbago with sciatica, left side: Secondary | ICD-10-CM | POA: Diagnosis not present

## 2019-11-16 DIAGNOSIS — M1990 Unspecified osteoarthritis, unspecified site: Secondary | ICD-10-CM | POA: Diagnosis not present

## 2019-11-18 DIAGNOSIS — M5442 Lumbago with sciatica, left side: Secondary | ICD-10-CM | POA: Diagnosis not present

## 2019-11-18 DIAGNOSIS — G629 Polyneuropathy, unspecified: Secondary | ICD-10-CM | POA: Diagnosis not present

## 2019-11-18 DIAGNOSIS — G8929 Other chronic pain: Secondary | ICD-10-CM | POA: Diagnosis not present

## 2019-11-18 DIAGNOSIS — I872 Venous insufficiency (chronic) (peripheral): Secondary | ICD-10-CM | POA: Diagnosis not present

## 2019-11-18 DIAGNOSIS — J309 Allergic rhinitis, unspecified: Secondary | ICD-10-CM | POA: Diagnosis not present

## 2019-11-18 DIAGNOSIS — I10 Essential (primary) hypertension: Secondary | ICD-10-CM | POA: Diagnosis not present

## 2019-11-18 DIAGNOSIS — M1990 Unspecified osteoarthritis, unspecified site: Secondary | ICD-10-CM | POA: Diagnosis not present

## 2019-11-18 DIAGNOSIS — J449 Chronic obstructive pulmonary disease, unspecified: Secondary | ICD-10-CM | POA: Diagnosis not present

## 2019-11-18 DIAGNOSIS — M5441 Lumbago with sciatica, right side: Secondary | ICD-10-CM | POA: Diagnosis not present

## 2019-11-23 DIAGNOSIS — M1990 Unspecified osteoarthritis, unspecified site: Secondary | ICD-10-CM | POA: Diagnosis not present

## 2019-11-23 DIAGNOSIS — I10 Essential (primary) hypertension: Secondary | ICD-10-CM | POA: Diagnosis not present

## 2019-11-23 DIAGNOSIS — G8929 Other chronic pain: Secondary | ICD-10-CM | POA: Diagnosis not present

## 2019-11-23 DIAGNOSIS — M5442 Lumbago with sciatica, left side: Secondary | ICD-10-CM | POA: Diagnosis not present

## 2019-11-23 DIAGNOSIS — J309 Allergic rhinitis, unspecified: Secondary | ICD-10-CM | POA: Diagnosis not present

## 2019-11-23 DIAGNOSIS — G629 Polyneuropathy, unspecified: Secondary | ICD-10-CM | POA: Diagnosis not present

## 2019-11-23 DIAGNOSIS — M5441 Lumbago with sciatica, right side: Secondary | ICD-10-CM | POA: Diagnosis not present

## 2019-11-23 DIAGNOSIS — J449 Chronic obstructive pulmonary disease, unspecified: Secondary | ICD-10-CM | POA: Diagnosis not present

## 2019-11-23 DIAGNOSIS — I872 Venous insufficiency (chronic) (peripheral): Secondary | ICD-10-CM | POA: Diagnosis not present

## 2019-11-24 DIAGNOSIS — J449 Chronic obstructive pulmonary disease, unspecified: Secondary | ICD-10-CM | POA: Diagnosis not present

## 2019-11-24 DIAGNOSIS — M1990 Unspecified osteoarthritis, unspecified site: Secondary | ICD-10-CM | POA: Diagnosis not present

## 2019-11-24 DIAGNOSIS — G8929 Other chronic pain: Secondary | ICD-10-CM | POA: Diagnosis not present

## 2019-11-24 DIAGNOSIS — M5442 Lumbago with sciatica, left side: Secondary | ICD-10-CM | POA: Diagnosis not present

## 2019-11-24 DIAGNOSIS — I872 Venous insufficiency (chronic) (peripheral): Secondary | ICD-10-CM | POA: Diagnosis not present

## 2019-11-24 DIAGNOSIS — M5441 Lumbago with sciatica, right side: Secondary | ICD-10-CM | POA: Diagnosis not present

## 2019-11-24 DIAGNOSIS — J309 Allergic rhinitis, unspecified: Secondary | ICD-10-CM | POA: Diagnosis not present

## 2019-11-24 DIAGNOSIS — I10 Essential (primary) hypertension: Secondary | ICD-10-CM | POA: Diagnosis not present

## 2019-11-24 DIAGNOSIS — G629 Polyneuropathy, unspecified: Secondary | ICD-10-CM | POA: Diagnosis not present

## 2019-11-25 DIAGNOSIS — J449 Chronic obstructive pulmonary disease, unspecified: Secondary | ICD-10-CM | POA: Diagnosis not present

## 2019-11-25 DIAGNOSIS — G629 Polyneuropathy, unspecified: Secondary | ICD-10-CM | POA: Diagnosis not present

## 2019-11-25 DIAGNOSIS — I872 Venous insufficiency (chronic) (peripheral): Secondary | ICD-10-CM | POA: Diagnosis not present

## 2019-11-25 DIAGNOSIS — M5442 Lumbago with sciatica, left side: Secondary | ICD-10-CM | POA: Diagnosis not present

## 2019-11-25 DIAGNOSIS — M5441 Lumbago with sciatica, right side: Secondary | ICD-10-CM | POA: Diagnosis not present

## 2019-11-25 DIAGNOSIS — J309 Allergic rhinitis, unspecified: Secondary | ICD-10-CM | POA: Diagnosis not present

## 2019-11-25 DIAGNOSIS — M1990 Unspecified osteoarthritis, unspecified site: Secondary | ICD-10-CM | POA: Diagnosis not present

## 2019-11-25 DIAGNOSIS — I10 Essential (primary) hypertension: Secondary | ICD-10-CM | POA: Diagnosis not present

## 2019-11-25 DIAGNOSIS — G8929 Other chronic pain: Secondary | ICD-10-CM | POA: Diagnosis not present

## 2019-11-28 DIAGNOSIS — M1990 Unspecified osteoarthritis, unspecified site: Secondary | ICD-10-CM | POA: Diagnosis not present

## 2019-11-28 DIAGNOSIS — G8929 Other chronic pain: Secondary | ICD-10-CM | POA: Diagnosis not present

## 2019-11-28 DIAGNOSIS — M5442 Lumbago with sciatica, left side: Secondary | ICD-10-CM | POA: Diagnosis not present

## 2019-11-28 DIAGNOSIS — G629 Polyneuropathy, unspecified: Secondary | ICD-10-CM | POA: Diagnosis not present

## 2019-11-28 DIAGNOSIS — J449 Chronic obstructive pulmonary disease, unspecified: Secondary | ICD-10-CM | POA: Diagnosis not present

## 2019-11-28 DIAGNOSIS — J309 Allergic rhinitis, unspecified: Secondary | ICD-10-CM | POA: Diagnosis not present

## 2019-11-28 DIAGNOSIS — I872 Venous insufficiency (chronic) (peripheral): Secondary | ICD-10-CM | POA: Diagnosis not present

## 2019-11-28 DIAGNOSIS — I10 Essential (primary) hypertension: Secondary | ICD-10-CM | POA: Diagnosis not present

## 2019-11-28 DIAGNOSIS — M5441 Lumbago with sciatica, right side: Secondary | ICD-10-CM | POA: Diagnosis not present

## 2019-11-30 DIAGNOSIS — G8929 Other chronic pain: Secondary | ICD-10-CM | POA: Diagnosis not present

## 2019-11-30 DIAGNOSIS — J309 Allergic rhinitis, unspecified: Secondary | ICD-10-CM | POA: Diagnosis not present

## 2019-11-30 DIAGNOSIS — G629 Polyneuropathy, unspecified: Secondary | ICD-10-CM | POA: Diagnosis not present

## 2019-11-30 DIAGNOSIS — J449 Chronic obstructive pulmonary disease, unspecified: Secondary | ICD-10-CM | POA: Diagnosis not present

## 2019-11-30 DIAGNOSIS — M5442 Lumbago with sciatica, left side: Secondary | ICD-10-CM | POA: Diagnosis not present

## 2019-11-30 DIAGNOSIS — I872 Venous insufficiency (chronic) (peripheral): Secondary | ICD-10-CM | POA: Diagnosis not present

## 2019-11-30 DIAGNOSIS — I10 Essential (primary) hypertension: Secondary | ICD-10-CM | POA: Diagnosis not present

## 2019-11-30 DIAGNOSIS — M5441 Lumbago with sciatica, right side: Secondary | ICD-10-CM | POA: Diagnosis not present

## 2019-11-30 DIAGNOSIS — M1990 Unspecified osteoarthritis, unspecified site: Secondary | ICD-10-CM | POA: Diagnosis not present

## 2019-12-02 DIAGNOSIS — I872 Venous insufficiency (chronic) (peripheral): Secondary | ICD-10-CM | POA: Diagnosis not present

## 2019-12-02 DIAGNOSIS — M5442 Lumbago with sciatica, left side: Secondary | ICD-10-CM | POA: Diagnosis not present

## 2019-12-02 DIAGNOSIS — M5441 Lumbago with sciatica, right side: Secondary | ICD-10-CM | POA: Diagnosis not present

## 2019-12-02 DIAGNOSIS — G8929 Other chronic pain: Secondary | ICD-10-CM | POA: Diagnosis not present

## 2019-12-02 DIAGNOSIS — J309 Allergic rhinitis, unspecified: Secondary | ICD-10-CM | POA: Diagnosis not present

## 2019-12-02 DIAGNOSIS — L97929 Non-pressure chronic ulcer of unspecified part of left lower leg with unspecified severity: Secondary | ICD-10-CM | POA: Diagnosis not present

## 2019-12-02 DIAGNOSIS — I10 Essential (primary) hypertension: Secondary | ICD-10-CM | POA: Diagnosis not present

## 2019-12-02 DIAGNOSIS — M1990 Unspecified osteoarthritis, unspecified site: Secondary | ICD-10-CM | POA: Diagnosis not present

## 2019-12-02 DIAGNOSIS — L97919 Non-pressure chronic ulcer of unspecified part of right lower leg with unspecified severity: Secondary | ICD-10-CM | POA: Diagnosis not present

## 2019-12-02 DIAGNOSIS — J449 Chronic obstructive pulmonary disease, unspecified: Secondary | ICD-10-CM | POA: Diagnosis not present

## 2019-12-02 DIAGNOSIS — N183 Chronic kidney disease, stage 3 unspecified: Secondary | ICD-10-CM | POA: Diagnosis not present

## 2019-12-02 DIAGNOSIS — G629 Polyneuropathy, unspecified: Secondary | ICD-10-CM | POA: Diagnosis not present

## 2019-12-05 DIAGNOSIS — J309 Allergic rhinitis, unspecified: Secondary | ICD-10-CM | POA: Diagnosis not present

## 2019-12-05 DIAGNOSIS — M5442 Lumbago with sciatica, left side: Secondary | ICD-10-CM | POA: Diagnosis not present

## 2019-12-05 DIAGNOSIS — M5441 Lumbago with sciatica, right side: Secondary | ICD-10-CM | POA: Diagnosis not present

## 2019-12-05 DIAGNOSIS — G8929 Other chronic pain: Secondary | ICD-10-CM | POA: Diagnosis not present

## 2019-12-05 DIAGNOSIS — J449 Chronic obstructive pulmonary disease, unspecified: Secondary | ICD-10-CM | POA: Diagnosis not present

## 2019-12-05 DIAGNOSIS — M1990 Unspecified osteoarthritis, unspecified site: Secondary | ICD-10-CM | POA: Diagnosis not present

## 2019-12-05 DIAGNOSIS — I10 Essential (primary) hypertension: Secondary | ICD-10-CM | POA: Diagnosis not present

## 2019-12-05 DIAGNOSIS — G629 Polyneuropathy, unspecified: Secondary | ICD-10-CM | POA: Diagnosis not present

## 2019-12-05 DIAGNOSIS — I872 Venous insufficiency (chronic) (peripheral): Secondary | ICD-10-CM | POA: Diagnosis not present

## 2019-12-08 DIAGNOSIS — I10 Essential (primary) hypertension: Secondary | ICD-10-CM | POA: Diagnosis not present

## 2019-12-08 DIAGNOSIS — M5441 Lumbago with sciatica, right side: Secondary | ICD-10-CM | POA: Diagnosis not present

## 2019-12-08 DIAGNOSIS — M5442 Lumbago with sciatica, left side: Secondary | ICD-10-CM | POA: Diagnosis not present

## 2019-12-08 DIAGNOSIS — G8929 Other chronic pain: Secondary | ICD-10-CM | POA: Diagnosis not present

## 2019-12-08 DIAGNOSIS — J449 Chronic obstructive pulmonary disease, unspecified: Secondary | ICD-10-CM | POA: Diagnosis not present

## 2019-12-08 DIAGNOSIS — J309 Allergic rhinitis, unspecified: Secondary | ICD-10-CM | POA: Diagnosis not present

## 2019-12-08 DIAGNOSIS — G629 Polyneuropathy, unspecified: Secondary | ICD-10-CM | POA: Diagnosis not present

## 2019-12-08 DIAGNOSIS — I872 Venous insufficiency (chronic) (peripheral): Secondary | ICD-10-CM | POA: Diagnosis not present

## 2019-12-08 DIAGNOSIS — M1990 Unspecified osteoarthritis, unspecified site: Secondary | ICD-10-CM | POA: Diagnosis not present

## 2019-12-11 DIAGNOSIS — M1990 Unspecified osteoarthritis, unspecified site: Secondary | ICD-10-CM | POA: Diagnosis not present

## 2019-12-11 DIAGNOSIS — M5441 Lumbago with sciatica, right side: Secondary | ICD-10-CM | POA: Diagnosis not present

## 2019-12-11 DIAGNOSIS — M5442 Lumbago with sciatica, left side: Secondary | ICD-10-CM | POA: Diagnosis not present

## 2019-12-11 DIAGNOSIS — I872 Venous insufficiency (chronic) (peripheral): Secondary | ICD-10-CM | POA: Diagnosis not present

## 2019-12-11 DIAGNOSIS — J449 Chronic obstructive pulmonary disease, unspecified: Secondary | ICD-10-CM | POA: Diagnosis not present

## 2019-12-11 DIAGNOSIS — G8929 Other chronic pain: Secondary | ICD-10-CM | POA: Diagnosis not present

## 2019-12-11 DIAGNOSIS — I10 Essential (primary) hypertension: Secondary | ICD-10-CM | POA: Diagnosis not present

## 2019-12-11 DIAGNOSIS — J309 Allergic rhinitis, unspecified: Secondary | ICD-10-CM | POA: Diagnosis not present

## 2019-12-11 DIAGNOSIS — G629 Polyneuropathy, unspecified: Secondary | ICD-10-CM | POA: Diagnosis not present

## 2019-12-12 DIAGNOSIS — M1990 Unspecified osteoarthritis, unspecified site: Secondary | ICD-10-CM | POA: Diagnosis not present

## 2019-12-12 DIAGNOSIS — J449 Chronic obstructive pulmonary disease, unspecified: Secondary | ICD-10-CM | POA: Diagnosis not present

## 2019-12-12 DIAGNOSIS — I10 Essential (primary) hypertension: Secondary | ICD-10-CM | POA: Diagnosis not present

## 2019-12-12 DIAGNOSIS — I872 Venous insufficiency (chronic) (peripheral): Secondary | ICD-10-CM | POA: Diagnosis not present

## 2019-12-12 DIAGNOSIS — M5442 Lumbago with sciatica, left side: Secondary | ICD-10-CM | POA: Diagnosis not present

## 2019-12-12 DIAGNOSIS — G8929 Other chronic pain: Secondary | ICD-10-CM | POA: Diagnosis not present

## 2019-12-12 DIAGNOSIS — G629 Polyneuropathy, unspecified: Secondary | ICD-10-CM | POA: Diagnosis not present

## 2019-12-12 DIAGNOSIS — M5441 Lumbago with sciatica, right side: Secondary | ICD-10-CM | POA: Diagnosis not present

## 2019-12-12 DIAGNOSIS — J309 Allergic rhinitis, unspecified: Secondary | ICD-10-CM | POA: Diagnosis not present

## 2019-12-13 DIAGNOSIS — I10 Essential (primary) hypertension: Secondary | ICD-10-CM | POA: Diagnosis not present

## 2019-12-13 DIAGNOSIS — M5441 Lumbago with sciatica, right side: Secondary | ICD-10-CM | POA: Diagnosis not present

## 2019-12-13 DIAGNOSIS — G629 Polyneuropathy, unspecified: Secondary | ICD-10-CM | POA: Diagnosis not present

## 2019-12-13 DIAGNOSIS — J309 Allergic rhinitis, unspecified: Secondary | ICD-10-CM | POA: Diagnosis not present

## 2019-12-13 DIAGNOSIS — M1990 Unspecified osteoarthritis, unspecified site: Secondary | ICD-10-CM | POA: Diagnosis not present

## 2019-12-13 DIAGNOSIS — J449 Chronic obstructive pulmonary disease, unspecified: Secondary | ICD-10-CM | POA: Diagnosis not present

## 2019-12-13 DIAGNOSIS — G8929 Other chronic pain: Secondary | ICD-10-CM | POA: Diagnosis not present

## 2019-12-13 DIAGNOSIS — M5442 Lumbago with sciatica, left side: Secondary | ICD-10-CM | POA: Diagnosis not present

## 2019-12-13 DIAGNOSIS — I872 Venous insufficiency (chronic) (peripheral): Secondary | ICD-10-CM | POA: Diagnosis not present

## 2019-12-14 DIAGNOSIS — R399 Unspecified symptoms and signs involving the genitourinary system: Secondary | ICD-10-CM | POA: Diagnosis not present

## 2019-12-14 DIAGNOSIS — G603 Idiopathic progressive neuropathy: Secondary | ICD-10-CM | POA: Diagnosis not present

## 2019-12-14 DIAGNOSIS — N3000 Acute cystitis without hematuria: Secondary | ICD-10-CM | POA: Diagnosis not present

## 2019-12-14 DIAGNOSIS — R2689 Other abnormalities of gait and mobility: Secondary | ICD-10-CM | POA: Diagnosis not present

## 2019-12-15 DIAGNOSIS — G8929 Other chronic pain: Secondary | ICD-10-CM | POA: Diagnosis not present

## 2019-12-15 DIAGNOSIS — G629 Polyneuropathy, unspecified: Secondary | ICD-10-CM | POA: Diagnosis not present

## 2019-12-15 DIAGNOSIS — I10 Essential (primary) hypertension: Secondary | ICD-10-CM | POA: Diagnosis not present

## 2019-12-15 DIAGNOSIS — M1990 Unspecified osteoarthritis, unspecified site: Secondary | ICD-10-CM | POA: Diagnosis not present

## 2019-12-15 DIAGNOSIS — M5442 Lumbago with sciatica, left side: Secondary | ICD-10-CM | POA: Diagnosis not present

## 2019-12-15 DIAGNOSIS — I872 Venous insufficiency (chronic) (peripheral): Secondary | ICD-10-CM | POA: Diagnosis not present

## 2019-12-15 DIAGNOSIS — J449 Chronic obstructive pulmonary disease, unspecified: Secondary | ICD-10-CM | POA: Diagnosis not present

## 2019-12-15 DIAGNOSIS — J309 Allergic rhinitis, unspecified: Secondary | ICD-10-CM | POA: Diagnosis not present

## 2019-12-15 DIAGNOSIS — M5441 Lumbago with sciatica, right side: Secondary | ICD-10-CM | POA: Diagnosis not present

## 2019-12-19 DIAGNOSIS — I10 Essential (primary) hypertension: Secondary | ICD-10-CM | POA: Diagnosis not present

## 2019-12-19 DIAGNOSIS — I872 Venous insufficiency (chronic) (peripheral): Secondary | ICD-10-CM | POA: Diagnosis not present

## 2019-12-19 DIAGNOSIS — G8929 Other chronic pain: Secondary | ICD-10-CM | POA: Diagnosis not present

## 2019-12-19 DIAGNOSIS — M5441 Lumbago with sciatica, right side: Secondary | ICD-10-CM | POA: Diagnosis not present

## 2019-12-19 DIAGNOSIS — J309 Allergic rhinitis, unspecified: Secondary | ICD-10-CM | POA: Diagnosis not present

## 2019-12-19 DIAGNOSIS — G629 Polyneuropathy, unspecified: Secondary | ICD-10-CM | POA: Diagnosis not present

## 2019-12-19 DIAGNOSIS — J449 Chronic obstructive pulmonary disease, unspecified: Secondary | ICD-10-CM | POA: Diagnosis not present

## 2019-12-19 DIAGNOSIS — M5442 Lumbago with sciatica, left side: Secondary | ICD-10-CM | POA: Diagnosis not present

## 2019-12-19 DIAGNOSIS — M1990 Unspecified osteoarthritis, unspecified site: Secondary | ICD-10-CM | POA: Diagnosis not present

## 2019-12-21 DIAGNOSIS — J309 Allergic rhinitis, unspecified: Secondary | ICD-10-CM | POA: Diagnosis not present

## 2019-12-21 DIAGNOSIS — G629 Polyneuropathy, unspecified: Secondary | ICD-10-CM | POA: Diagnosis not present

## 2019-12-21 DIAGNOSIS — M5441 Lumbago with sciatica, right side: Secondary | ICD-10-CM | POA: Diagnosis not present

## 2019-12-21 DIAGNOSIS — G8929 Other chronic pain: Secondary | ICD-10-CM | POA: Diagnosis not present

## 2019-12-21 DIAGNOSIS — M5442 Lumbago with sciatica, left side: Secondary | ICD-10-CM | POA: Diagnosis not present

## 2019-12-21 DIAGNOSIS — I10 Essential (primary) hypertension: Secondary | ICD-10-CM | POA: Diagnosis not present

## 2019-12-21 DIAGNOSIS — M1990 Unspecified osteoarthritis, unspecified site: Secondary | ICD-10-CM | POA: Diagnosis not present

## 2019-12-21 DIAGNOSIS — J449 Chronic obstructive pulmonary disease, unspecified: Secondary | ICD-10-CM | POA: Diagnosis not present

## 2019-12-21 DIAGNOSIS — I872 Venous insufficiency (chronic) (peripheral): Secondary | ICD-10-CM | POA: Diagnosis not present

## 2019-12-23 DIAGNOSIS — M5442 Lumbago with sciatica, left side: Secondary | ICD-10-CM | POA: Diagnosis not present

## 2019-12-23 DIAGNOSIS — G629 Polyneuropathy, unspecified: Secondary | ICD-10-CM | POA: Diagnosis not present

## 2019-12-23 DIAGNOSIS — I872 Venous insufficiency (chronic) (peripheral): Secondary | ICD-10-CM | POA: Diagnosis not present

## 2019-12-23 DIAGNOSIS — J449 Chronic obstructive pulmonary disease, unspecified: Secondary | ICD-10-CM | POA: Diagnosis not present

## 2019-12-23 DIAGNOSIS — I10 Essential (primary) hypertension: Secondary | ICD-10-CM | POA: Diagnosis not present

## 2019-12-23 DIAGNOSIS — J309 Allergic rhinitis, unspecified: Secondary | ICD-10-CM | POA: Diagnosis not present

## 2019-12-23 DIAGNOSIS — M5441 Lumbago with sciatica, right side: Secondary | ICD-10-CM | POA: Diagnosis not present

## 2019-12-23 DIAGNOSIS — M1990 Unspecified osteoarthritis, unspecified site: Secondary | ICD-10-CM | POA: Diagnosis not present

## 2019-12-23 DIAGNOSIS — G8929 Other chronic pain: Secondary | ICD-10-CM | POA: Diagnosis not present

## 2019-12-28 DIAGNOSIS — I872 Venous insufficiency (chronic) (peripheral): Secondary | ICD-10-CM | POA: Diagnosis not present

## 2019-12-28 DIAGNOSIS — J449 Chronic obstructive pulmonary disease, unspecified: Secondary | ICD-10-CM | POA: Diagnosis not present

## 2019-12-28 DIAGNOSIS — G8929 Other chronic pain: Secondary | ICD-10-CM | POA: Diagnosis not present

## 2019-12-28 DIAGNOSIS — M5442 Lumbago with sciatica, left side: Secondary | ICD-10-CM | POA: Diagnosis not present

## 2019-12-28 DIAGNOSIS — I10 Essential (primary) hypertension: Secondary | ICD-10-CM | POA: Diagnosis not present

## 2019-12-28 DIAGNOSIS — M1990 Unspecified osteoarthritis, unspecified site: Secondary | ICD-10-CM | POA: Diagnosis not present

## 2019-12-28 DIAGNOSIS — J309 Allergic rhinitis, unspecified: Secondary | ICD-10-CM | POA: Diagnosis not present

## 2019-12-28 DIAGNOSIS — M5441 Lumbago with sciatica, right side: Secondary | ICD-10-CM | POA: Diagnosis not present

## 2019-12-28 DIAGNOSIS — G629 Polyneuropathy, unspecified: Secondary | ICD-10-CM | POA: Diagnosis not present

## 2020-01-06 DIAGNOSIS — J449 Chronic obstructive pulmonary disease, unspecified: Secondary | ICD-10-CM | POA: Diagnosis not present

## 2020-01-06 DIAGNOSIS — M5442 Lumbago with sciatica, left side: Secondary | ICD-10-CM | POA: Diagnosis not present

## 2020-01-06 DIAGNOSIS — G629 Polyneuropathy, unspecified: Secondary | ICD-10-CM | POA: Diagnosis not present

## 2020-01-06 DIAGNOSIS — G8929 Other chronic pain: Secondary | ICD-10-CM | POA: Diagnosis not present

## 2020-01-06 DIAGNOSIS — I872 Venous insufficiency (chronic) (peripheral): Secondary | ICD-10-CM | POA: Diagnosis not present

## 2020-01-06 DIAGNOSIS — J309 Allergic rhinitis, unspecified: Secondary | ICD-10-CM | POA: Diagnosis not present

## 2020-01-06 DIAGNOSIS — M1990 Unspecified osteoarthritis, unspecified site: Secondary | ICD-10-CM | POA: Diagnosis not present

## 2020-01-06 DIAGNOSIS — M5441 Lumbago with sciatica, right side: Secondary | ICD-10-CM | POA: Diagnosis not present

## 2020-01-06 DIAGNOSIS — I10 Essential (primary) hypertension: Secondary | ICD-10-CM | POA: Diagnosis not present

## 2020-01-10 DIAGNOSIS — S80821D Blister (nonthermal), right lower leg, subsequent encounter: Secondary | ICD-10-CM | POA: Diagnosis not present

## 2020-01-10 DIAGNOSIS — M5441 Lumbago with sciatica, right side: Secondary | ICD-10-CM | POA: Diagnosis not present

## 2020-01-10 DIAGNOSIS — M1990 Unspecified osteoarthritis, unspecified site: Secondary | ICD-10-CM | POA: Diagnosis not present

## 2020-01-10 DIAGNOSIS — G8929 Other chronic pain: Secondary | ICD-10-CM | POA: Diagnosis not present

## 2020-01-10 DIAGNOSIS — J449 Chronic obstructive pulmonary disease, unspecified: Secondary | ICD-10-CM | POA: Diagnosis not present

## 2020-01-10 DIAGNOSIS — M5442 Lumbago with sciatica, left side: Secondary | ICD-10-CM | POA: Diagnosis not present

## 2020-01-10 DIAGNOSIS — I10 Essential (primary) hypertension: Secondary | ICD-10-CM | POA: Diagnosis not present

## 2020-01-10 DIAGNOSIS — J309 Allergic rhinitis, unspecified: Secondary | ICD-10-CM | POA: Diagnosis not present

## 2020-01-10 DIAGNOSIS — I872 Venous insufficiency (chronic) (peripheral): Secondary | ICD-10-CM | POA: Diagnosis not present

## 2020-01-13 DIAGNOSIS — G8929 Other chronic pain: Secondary | ICD-10-CM | POA: Diagnosis not present

## 2020-01-13 DIAGNOSIS — I10 Essential (primary) hypertension: Secondary | ICD-10-CM | POA: Diagnosis not present

## 2020-01-13 DIAGNOSIS — I872 Venous insufficiency (chronic) (peripheral): Secondary | ICD-10-CM | POA: Diagnosis not present

## 2020-01-13 DIAGNOSIS — J309 Allergic rhinitis, unspecified: Secondary | ICD-10-CM | POA: Diagnosis not present

## 2020-01-13 DIAGNOSIS — M5442 Lumbago with sciatica, left side: Secondary | ICD-10-CM | POA: Diagnosis not present

## 2020-01-13 DIAGNOSIS — M1990 Unspecified osteoarthritis, unspecified site: Secondary | ICD-10-CM | POA: Diagnosis not present

## 2020-01-13 DIAGNOSIS — M5441 Lumbago with sciatica, right side: Secondary | ICD-10-CM | POA: Diagnosis not present

## 2020-01-13 DIAGNOSIS — S80821D Blister (nonthermal), right lower leg, subsequent encounter: Secondary | ICD-10-CM | POA: Diagnosis not present

## 2020-01-13 DIAGNOSIS — J449 Chronic obstructive pulmonary disease, unspecified: Secondary | ICD-10-CM | POA: Diagnosis not present

## 2020-01-16 DIAGNOSIS — M1990 Unspecified osteoarthritis, unspecified site: Secondary | ICD-10-CM | POA: Diagnosis not present

## 2020-01-16 DIAGNOSIS — M5442 Lumbago with sciatica, left side: Secondary | ICD-10-CM | POA: Diagnosis not present

## 2020-01-16 DIAGNOSIS — G8929 Other chronic pain: Secondary | ICD-10-CM | POA: Diagnosis not present

## 2020-01-16 DIAGNOSIS — J449 Chronic obstructive pulmonary disease, unspecified: Secondary | ICD-10-CM | POA: Diagnosis not present

## 2020-01-16 DIAGNOSIS — M5441 Lumbago with sciatica, right side: Secondary | ICD-10-CM | POA: Diagnosis not present

## 2020-01-16 DIAGNOSIS — J309 Allergic rhinitis, unspecified: Secondary | ICD-10-CM | POA: Diagnosis not present

## 2020-01-16 DIAGNOSIS — I10 Essential (primary) hypertension: Secondary | ICD-10-CM | POA: Diagnosis not present

## 2020-01-16 DIAGNOSIS — S80821D Blister (nonthermal), right lower leg, subsequent encounter: Secondary | ICD-10-CM | POA: Diagnosis not present

## 2020-01-16 DIAGNOSIS — I872 Venous insufficiency (chronic) (peripheral): Secondary | ICD-10-CM | POA: Diagnosis not present

## 2020-01-20 DIAGNOSIS — I872 Venous insufficiency (chronic) (peripheral): Secondary | ICD-10-CM | POA: Diagnosis not present

## 2020-01-20 DIAGNOSIS — G8929 Other chronic pain: Secondary | ICD-10-CM | POA: Diagnosis not present

## 2020-01-20 DIAGNOSIS — M5442 Lumbago with sciatica, left side: Secondary | ICD-10-CM | POA: Diagnosis not present

## 2020-01-20 DIAGNOSIS — M1990 Unspecified osteoarthritis, unspecified site: Secondary | ICD-10-CM | POA: Diagnosis not present

## 2020-01-20 DIAGNOSIS — I10 Essential (primary) hypertension: Secondary | ICD-10-CM | POA: Diagnosis not present

## 2020-01-20 DIAGNOSIS — J309 Allergic rhinitis, unspecified: Secondary | ICD-10-CM | POA: Diagnosis not present

## 2020-01-20 DIAGNOSIS — J449 Chronic obstructive pulmonary disease, unspecified: Secondary | ICD-10-CM | POA: Diagnosis not present

## 2020-01-20 DIAGNOSIS — S80821D Blister (nonthermal), right lower leg, subsequent encounter: Secondary | ICD-10-CM | POA: Diagnosis not present

## 2020-01-20 DIAGNOSIS — M5441 Lumbago with sciatica, right side: Secondary | ICD-10-CM | POA: Diagnosis not present

## 2020-01-25 DIAGNOSIS — M1990 Unspecified osteoarthritis, unspecified site: Secondary | ICD-10-CM | POA: Diagnosis not present

## 2020-01-25 DIAGNOSIS — S80821D Blister (nonthermal), right lower leg, subsequent encounter: Secondary | ICD-10-CM | POA: Diagnosis not present

## 2020-01-25 DIAGNOSIS — I10 Essential (primary) hypertension: Secondary | ICD-10-CM | POA: Diagnosis not present

## 2020-01-25 DIAGNOSIS — M5442 Lumbago with sciatica, left side: Secondary | ICD-10-CM | POA: Diagnosis not present

## 2020-01-25 DIAGNOSIS — G8929 Other chronic pain: Secondary | ICD-10-CM | POA: Diagnosis not present

## 2020-01-25 DIAGNOSIS — I872 Venous insufficiency (chronic) (peripheral): Secondary | ICD-10-CM | POA: Diagnosis not present

## 2020-01-25 DIAGNOSIS — J449 Chronic obstructive pulmonary disease, unspecified: Secondary | ICD-10-CM | POA: Diagnosis not present

## 2020-01-25 DIAGNOSIS — J309 Allergic rhinitis, unspecified: Secondary | ICD-10-CM | POA: Diagnosis not present

## 2020-01-25 DIAGNOSIS — M5441 Lumbago with sciatica, right side: Secondary | ICD-10-CM | POA: Diagnosis not present

## 2020-01-26 DIAGNOSIS — G8929 Other chronic pain: Secondary | ICD-10-CM | POA: Diagnosis not present

## 2020-01-26 DIAGNOSIS — I872 Venous insufficiency (chronic) (peripheral): Secondary | ICD-10-CM | POA: Diagnosis not present

## 2020-01-26 DIAGNOSIS — J449 Chronic obstructive pulmonary disease, unspecified: Secondary | ICD-10-CM | POA: Diagnosis not present

## 2020-01-26 DIAGNOSIS — I10 Essential (primary) hypertension: Secondary | ICD-10-CM | POA: Diagnosis not present

## 2020-01-26 DIAGNOSIS — M1990 Unspecified osteoarthritis, unspecified site: Secondary | ICD-10-CM | POA: Diagnosis not present

## 2020-01-26 DIAGNOSIS — M5441 Lumbago with sciatica, right side: Secondary | ICD-10-CM | POA: Diagnosis not present

## 2020-01-26 DIAGNOSIS — S80821D Blister (nonthermal), right lower leg, subsequent encounter: Secondary | ICD-10-CM | POA: Diagnosis not present

## 2020-01-26 DIAGNOSIS — J309 Allergic rhinitis, unspecified: Secondary | ICD-10-CM | POA: Diagnosis not present

## 2020-01-26 DIAGNOSIS — M5442 Lumbago with sciatica, left side: Secondary | ICD-10-CM | POA: Diagnosis not present

## 2020-02-01 DIAGNOSIS — I872 Venous insufficiency (chronic) (peripheral): Secondary | ICD-10-CM | POA: Diagnosis not present

## 2020-02-01 DIAGNOSIS — M5442 Lumbago with sciatica, left side: Secondary | ICD-10-CM | POA: Diagnosis not present

## 2020-02-01 DIAGNOSIS — M5441 Lumbago with sciatica, right side: Secondary | ICD-10-CM | POA: Diagnosis not present

## 2020-02-01 DIAGNOSIS — J449 Chronic obstructive pulmonary disease, unspecified: Secondary | ICD-10-CM | POA: Diagnosis not present

## 2020-02-01 DIAGNOSIS — G8929 Other chronic pain: Secondary | ICD-10-CM | POA: Diagnosis not present

## 2020-02-01 DIAGNOSIS — M1990 Unspecified osteoarthritis, unspecified site: Secondary | ICD-10-CM | POA: Diagnosis not present

## 2020-02-01 DIAGNOSIS — S80821D Blister (nonthermal), right lower leg, subsequent encounter: Secondary | ICD-10-CM | POA: Diagnosis not present

## 2020-02-01 DIAGNOSIS — J309 Allergic rhinitis, unspecified: Secondary | ICD-10-CM | POA: Diagnosis not present

## 2020-02-01 DIAGNOSIS — I10 Essential (primary) hypertension: Secondary | ICD-10-CM | POA: Diagnosis not present

## 2020-02-06 DIAGNOSIS — S80821D Blister (nonthermal), right lower leg, subsequent encounter: Secondary | ICD-10-CM | POA: Diagnosis not present

## 2020-02-06 DIAGNOSIS — J309 Allergic rhinitis, unspecified: Secondary | ICD-10-CM | POA: Diagnosis not present

## 2020-02-06 DIAGNOSIS — M1990 Unspecified osteoarthritis, unspecified site: Secondary | ICD-10-CM | POA: Diagnosis not present

## 2020-02-06 DIAGNOSIS — J449 Chronic obstructive pulmonary disease, unspecified: Secondary | ICD-10-CM | POA: Diagnosis not present

## 2020-02-06 DIAGNOSIS — M5442 Lumbago with sciatica, left side: Secondary | ICD-10-CM | POA: Diagnosis not present

## 2020-02-06 DIAGNOSIS — M5441 Lumbago with sciatica, right side: Secondary | ICD-10-CM | POA: Diagnosis not present

## 2020-02-06 DIAGNOSIS — G8929 Other chronic pain: Secondary | ICD-10-CM | POA: Diagnosis not present

## 2020-02-06 DIAGNOSIS — I872 Venous insufficiency (chronic) (peripheral): Secondary | ICD-10-CM | POA: Diagnosis not present

## 2020-02-06 DIAGNOSIS — I10 Essential (primary) hypertension: Secondary | ICD-10-CM | POA: Diagnosis not present

## 2020-02-07 DIAGNOSIS — G8929 Other chronic pain: Secondary | ICD-10-CM | POA: Diagnosis not present

## 2020-02-07 DIAGNOSIS — I872 Venous insufficiency (chronic) (peripheral): Secondary | ICD-10-CM | POA: Diagnosis not present

## 2020-02-07 DIAGNOSIS — J309 Allergic rhinitis, unspecified: Secondary | ICD-10-CM | POA: Diagnosis not present

## 2020-02-07 DIAGNOSIS — J449 Chronic obstructive pulmonary disease, unspecified: Secondary | ICD-10-CM | POA: Diagnosis not present

## 2020-02-07 DIAGNOSIS — M1990 Unspecified osteoarthritis, unspecified site: Secondary | ICD-10-CM | POA: Diagnosis not present

## 2020-02-07 DIAGNOSIS — I10 Essential (primary) hypertension: Secondary | ICD-10-CM | POA: Diagnosis not present

## 2020-02-07 DIAGNOSIS — M5442 Lumbago with sciatica, left side: Secondary | ICD-10-CM | POA: Diagnosis not present

## 2020-02-07 DIAGNOSIS — M5441 Lumbago with sciatica, right side: Secondary | ICD-10-CM | POA: Diagnosis not present

## 2020-02-07 DIAGNOSIS — S80821D Blister (nonthermal), right lower leg, subsequent encounter: Secondary | ICD-10-CM | POA: Diagnosis not present

## 2020-02-09 DIAGNOSIS — M5441 Lumbago with sciatica, right side: Secondary | ICD-10-CM | POA: Diagnosis not present

## 2020-02-09 DIAGNOSIS — G8929 Other chronic pain: Secondary | ICD-10-CM | POA: Diagnosis not present

## 2020-02-09 DIAGNOSIS — I872 Venous insufficiency (chronic) (peripheral): Secondary | ICD-10-CM | POA: Diagnosis not present

## 2020-02-09 DIAGNOSIS — J449 Chronic obstructive pulmonary disease, unspecified: Secondary | ICD-10-CM | POA: Diagnosis not present

## 2020-02-09 DIAGNOSIS — J309 Allergic rhinitis, unspecified: Secondary | ICD-10-CM | POA: Diagnosis not present

## 2020-02-09 DIAGNOSIS — M5442 Lumbago with sciatica, left side: Secondary | ICD-10-CM | POA: Diagnosis not present

## 2020-02-09 DIAGNOSIS — M1990 Unspecified osteoarthritis, unspecified site: Secondary | ICD-10-CM | POA: Diagnosis not present

## 2020-02-09 DIAGNOSIS — I10 Essential (primary) hypertension: Secondary | ICD-10-CM | POA: Diagnosis not present

## 2020-02-09 DIAGNOSIS — S80821D Blister (nonthermal), right lower leg, subsequent encounter: Secondary | ICD-10-CM | POA: Diagnosis not present

## 2020-02-16 DIAGNOSIS — G8929 Other chronic pain: Secondary | ICD-10-CM | POA: Diagnosis not present

## 2020-02-16 DIAGNOSIS — J309 Allergic rhinitis, unspecified: Secondary | ICD-10-CM | POA: Diagnosis not present

## 2020-02-16 DIAGNOSIS — J449 Chronic obstructive pulmonary disease, unspecified: Secondary | ICD-10-CM | POA: Diagnosis not present

## 2020-02-16 DIAGNOSIS — I872 Venous insufficiency (chronic) (peripheral): Secondary | ICD-10-CM | POA: Diagnosis not present

## 2020-02-16 DIAGNOSIS — I10 Essential (primary) hypertension: Secondary | ICD-10-CM | POA: Diagnosis not present

## 2020-02-16 DIAGNOSIS — M5442 Lumbago with sciatica, left side: Secondary | ICD-10-CM | POA: Diagnosis not present

## 2020-02-16 DIAGNOSIS — S80821D Blister (nonthermal), right lower leg, subsequent encounter: Secondary | ICD-10-CM | POA: Diagnosis not present

## 2020-02-16 DIAGNOSIS — M5441 Lumbago with sciatica, right side: Secondary | ICD-10-CM | POA: Diagnosis not present

## 2020-02-16 DIAGNOSIS — M1990 Unspecified osteoarthritis, unspecified site: Secondary | ICD-10-CM | POA: Diagnosis not present

## 2020-02-22 DIAGNOSIS — J309 Allergic rhinitis, unspecified: Secondary | ICD-10-CM | POA: Diagnosis not present

## 2020-02-22 DIAGNOSIS — M1990 Unspecified osteoarthritis, unspecified site: Secondary | ICD-10-CM | POA: Diagnosis not present

## 2020-02-22 DIAGNOSIS — I10 Essential (primary) hypertension: Secondary | ICD-10-CM | POA: Diagnosis not present

## 2020-02-22 DIAGNOSIS — G8929 Other chronic pain: Secondary | ICD-10-CM | POA: Diagnosis not present

## 2020-02-22 DIAGNOSIS — I872 Venous insufficiency (chronic) (peripheral): Secondary | ICD-10-CM | POA: Diagnosis not present

## 2020-02-22 DIAGNOSIS — J449 Chronic obstructive pulmonary disease, unspecified: Secondary | ICD-10-CM | POA: Diagnosis not present

## 2020-02-22 DIAGNOSIS — S80821D Blister (nonthermal), right lower leg, subsequent encounter: Secondary | ICD-10-CM | POA: Diagnosis not present

## 2020-02-22 DIAGNOSIS — M5441 Lumbago with sciatica, right side: Secondary | ICD-10-CM | POA: Diagnosis not present

## 2020-02-22 DIAGNOSIS — M5442 Lumbago with sciatica, left side: Secondary | ICD-10-CM | POA: Diagnosis not present

## 2020-03-06 DIAGNOSIS — M5442 Lumbago with sciatica, left side: Secondary | ICD-10-CM | POA: Diagnosis not present

## 2020-03-06 DIAGNOSIS — J309 Allergic rhinitis, unspecified: Secondary | ICD-10-CM | POA: Diagnosis not present

## 2020-03-06 DIAGNOSIS — M1990 Unspecified osteoarthritis, unspecified site: Secondary | ICD-10-CM | POA: Diagnosis not present

## 2020-03-06 DIAGNOSIS — J449 Chronic obstructive pulmonary disease, unspecified: Secondary | ICD-10-CM | POA: Diagnosis not present

## 2020-03-06 DIAGNOSIS — S80821D Blister (nonthermal), right lower leg, subsequent encounter: Secondary | ICD-10-CM | POA: Diagnosis not present

## 2020-03-06 DIAGNOSIS — M5441 Lumbago with sciatica, right side: Secondary | ICD-10-CM | POA: Diagnosis not present

## 2020-03-06 DIAGNOSIS — I872 Venous insufficiency (chronic) (peripheral): Secondary | ICD-10-CM | POA: Diagnosis not present

## 2020-03-06 DIAGNOSIS — G8929 Other chronic pain: Secondary | ICD-10-CM | POA: Diagnosis not present

## 2020-03-06 DIAGNOSIS — I10 Essential (primary) hypertension: Secondary | ICD-10-CM | POA: Diagnosis not present

## 2020-03-08 DIAGNOSIS — J309 Allergic rhinitis, unspecified: Secondary | ICD-10-CM | POA: Diagnosis not present

## 2020-03-08 DIAGNOSIS — J449 Chronic obstructive pulmonary disease, unspecified: Secondary | ICD-10-CM | POA: Diagnosis not present

## 2020-03-08 DIAGNOSIS — M5441 Lumbago with sciatica, right side: Secondary | ICD-10-CM | POA: Diagnosis not present

## 2020-03-08 DIAGNOSIS — I872 Venous insufficiency (chronic) (peripheral): Secondary | ICD-10-CM | POA: Diagnosis not present

## 2020-03-08 DIAGNOSIS — S80821D Blister (nonthermal), right lower leg, subsequent encounter: Secondary | ICD-10-CM | POA: Diagnosis not present

## 2020-03-08 DIAGNOSIS — I10 Essential (primary) hypertension: Secondary | ICD-10-CM | POA: Diagnosis not present

## 2020-03-08 DIAGNOSIS — M5442 Lumbago with sciatica, left side: Secondary | ICD-10-CM | POA: Diagnosis not present

## 2020-03-08 DIAGNOSIS — M1990 Unspecified osteoarthritis, unspecified site: Secondary | ICD-10-CM | POA: Diagnosis not present

## 2020-03-08 DIAGNOSIS — G8929 Other chronic pain: Secondary | ICD-10-CM | POA: Diagnosis not present

## 2020-03-10 DIAGNOSIS — M549 Dorsalgia, unspecified: Secondary | ICD-10-CM | POA: Diagnosis not present

## 2020-03-10 DIAGNOSIS — G8929 Other chronic pain: Secondary | ICD-10-CM | POA: Diagnosis not present

## 2020-03-10 DIAGNOSIS — J449 Chronic obstructive pulmonary disease, unspecified: Secondary | ICD-10-CM | POA: Diagnosis not present

## 2020-03-10 DIAGNOSIS — M1990 Unspecified osteoarthritis, unspecified site: Secondary | ICD-10-CM | POA: Diagnosis not present

## 2020-03-10 DIAGNOSIS — M79605 Pain in left leg: Secondary | ICD-10-CM | POA: Diagnosis not present

## 2020-03-10 DIAGNOSIS — I10 Essential (primary) hypertension: Secondary | ICD-10-CM | POA: Diagnosis not present

## 2020-03-10 DIAGNOSIS — J309 Allergic rhinitis, unspecified: Secondary | ICD-10-CM | POA: Diagnosis not present

## 2020-03-10 DIAGNOSIS — M5442 Lumbago with sciatica, left side: Secondary | ICD-10-CM | POA: Diagnosis not present

## 2020-03-10 DIAGNOSIS — G629 Polyneuropathy, unspecified: Secondary | ICD-10-CM | POA: Diagnosis not present

## 2020-03-10 DIAGNOSIS — W19XXXD Unspecified fall, subsequent encounter: Secondary | ICD-10-CM | POA: Diagnosis not present

## 2020-03-10 DIAGNOSIS — I872 Venous insufficiency (chronic) (peripheral): Secondary | ICD-10-CM | POA: Diagnosis not present

## 2020-03-10 DIAGNOSIS — M5441 Lumbago with sciatica, right side: Secondary | ICD-10-CM | POA: Diagnosis not present

## 2020-03-14 DIAGNOSIS — M5441 Lumbago with sciatica, right side: Secondary | ICD-10-CM | POA: Diagnosis not present

## 2020-03-14 DIAGNOSIS — I872 Venous insufficiency (chronic) (peripheral): Secondary | ICD-10-CM | POA: Diagnosis not present

## 2020-03-14 DIAGNOSIS — M5442 Lumbago with sciatica, left side: Secondary | ICD-10-CM | POA: Diagnosis not present

## 2020-03-14 DIAGNOSIS — G629 Polyneuropathy, unspecified: Secondary | ICD-10-CM | POA: Diagnosis not present

## 2020-03-14 DIAGNOSIS — J309 Allergic rhinitis, unspecified: Secondary | ICD-10-CM | POA: Diagnosis not present

## 2020-03-14 DIAGNOSIS — G8929 Other chronic pain: Secondary | ICD-10-CM | POA: Diagnosis not present

## 2020-03-14 DIAGNOSIS — J449 Chronic obstructive pulmonary disease, unspecified: Secondary | ICD-10-CM | POA: Diagnosis not present

## 2020-03-14 DIAGNOSIS — I10 Essential (primary) hypertension: Secondary | ICD-10-CM | POA: Diagnosis not present

## 2020-03-14 DIAGNOSIS — M1990 Unspecified osteoarthritis, unspecified site: Secondary | ICD-10-CM | POA: Diagnosis not present

## 2020-03-21 DIAGNOSIS — I872 Venous insufficiency (chronic) (peripheral): Secondary | ICD-10-CM | POA: Diagnosis not present

## 2020-03-21 DIAGNOSIS — I10 Essential (primary) hypertension: Secondary | ICD-10-CM | POA: Diagnosis not present

## 2020-03-21 DIAGNOSIS — G629 Polyneuropathy, unspecified: Secondary | ICD-10-CM | POA: Diagnosis not present

## 2020-03-21 DIAGNOSIS — G8929 Other chronic pain: Secondary | ICD-10-CM | POA: Diagnosis not present

## 2020-03-21 DIAGNOSIS — M1990 Unspecified osteoarthritis, unspecified site: Secondary | ICD-10-CM | POA: Diagnosis not present

## 2020-03-21 DIAGNOSIS — M5441 Lumbago with sciatica, right side: Secondary | ICD-10-CM | POA: Diagnosis not present

## 2020-03-21 DIAGNOSIS — J309 Allergic rhinitis, unspecified: Secondary | ICD-10-CM | POA: Diagnosis not present

## 2020-03-21 DIAGNOSIS — M5442 Lumbago with sciatica, left side: Secondary | ICD-10-CM | POA: Diagnosis not present

## 2020-03-21 DIAGNOSIS — J449 Chronic obstructive pulmonary disease, unspecified: Secondary | ICD-10-CM | POA: Diagnosis not present

## 2020-03-29 DIAGNOSIS — G8929 Other chronic pain: Secondary | ICD-10-CM | POA: Diagnosis not present

## 2020-03-29 DIAGNOSIS — M549 Dorsalgia, unspecified: Secondary | ICD-10-CM | POA: Diagnosis not present

## 2020-03-29 DIAGNOSIS — F4321 Adjustment disorder with depressed mood: Secondary | ICD-10-CM | POA: Diagnosis not present

## 2020-04-02 DIAGNOSIS — I480 Paroxysmal atrial fibrillation: Secondary | ICD-10-CM | POA: Diagnosis not present

## 2020-04-02 DIAGNOSIS — J9610 Chronic respiratory failure, unspecified whether with hypoxia or hypercapnia: Secondary | ICD-10-CM | POA: Diagnosis not present

## 2020-04-02 DIAGNOSIS — G4733 Obstructive sleep apnea (adult) (pediatric): Secondary | ICD-10-CM | POA: Diagnosis not present

## 2020-04-02 DIAGNOSIS — G8929 Other chronic pain: Secondary | ICD-10-CM | POA: Diagnosis not present

## 2020-04-02 DIAGNOSIS — Z6841 Body Mass Index (BMI) 40.0 and over, adult: Secondary | ICD-10-CM | POA: Diagnosis not present

## 2020-04-02 DIAGNOSIS — D6869 Other thrombophilia: Secondary | ICD-10-CM | POA: Diagnosis not present

## 2020-04-02 DIAGNOSIS — F320 Major depressive disorder, single episode, mild: Secondary | ICD-10-CM | POA: Diagnosis not present

## 2020-04-02 DIAGNOSIS — I130 Hypertensive heart and chronic kidney disease with heart failure and stage 1 through stage 4 chronic kidney disease, or unspecified chronic kidney disease: Secondary | ICD-10-CM | POA: Diagnosis not present

## 2020-04-02 DIAGNOSIS — I4811 Longstanding persistent atrial fibrillation: Secondary | ICD-10-CM | POA: Diagnosis not present

## 2020-04-02 DIAGNOSIS — E261 Secondary hyperaldosteronism: Secondary | ICD-10-CM | POA: Diagnosis not present

## 2020-04-02 DIAGNOSIS — N183 Chronic kidney disease, stage 3 unspecified: Secondary | ICD-10-CM | POA: Diagnosis not present

## 2020-04-02 DIAGNOSIS — E782 Mixed hyperlipidemia: Secondary | ICD-10-CM | POA: Diagnosis not present

## 2020-04-02 DIAGNOSIS — I10 Essential (primary) hypertension: Secondary | ICD-10-CM | POA: Diagnosis not present

## 2020-04-02 DIAGNOSIS — I739 Peripheral vascular disease, unspecified: Secondary | ICD-10-CM | POA: Diagnosis not present

## 2020-04-02 DIAGNOSIS — J439 Emphysema, unspecified: Secondary | ICD-10-CM | POA: Diagnosis not present

## 2020-04-02 DIAGNOSIS — D692 Other nonthrombocytopenic purpura: Secondary | ICD-10-CM | POA: Diagnosis not present

## 2020-04-02 DIAGNOSIS — E039 Hypothyroidism, unspecified: Secondary | ICD-10-CM | POA: Diagnosis not present

## 2020-04-02 DIAGNOSIS — I509 Heart failure, unspecified: Secondary | ICD-10-CM | POA: Diagnosis not present

## 2020-04-02 DIAGNOSIS — Z23 Encounter for immunization: Secondary | ICD-10-CM | POA: Diagnosis not present

## 2020-04-03 DIAGNOSIS — I4819 Other persistent atrial fibrillation: Secondary | ICD-10-CM | POA: Diagnosis not present

## 2020-04-03 DIAGNOSIS — D6859 Other primary thrombophilia: Secondary | ICD-10-CM | POA: Diagnosis not present

## 2020-04-03 DIAGNOSIS — R7302 Impaired glucose tolerance (oral): Secondary | ICD-10-CM | POA: Diagnosis not present

## 2020-04-03 DIAGNOSIS — G4733 Obstructive sleep apnea (adult) (pediatric): Secondary | ICD-10-CM | POA: Diagnosis not present

## 2020-04-03 DIAGNOSIS — I129 Hypertensive chronic kidney disease with stage 1 through stage 4 chronic kidney disease, or unspecified chronic kidney disease: Secondary | ICD-10-CM | POA: Diagnosis not present

## 2020-04-03 DIAGNOSIS — E213 Hyperparathyroidism, unspecified: Secondary | ICD-10-CM | POA: Diagnosis not present

## 2020-04-03 DIAGNOSIS — E210 Primary hyperparathyroidism: Secondary | ICD-10-CM | POA: Diagnosis not present

## 2020-04-03 DIAGNOSIS — N183 Chronic kidney disease, stage 3 unspecified: Secondary | ICD-10-CM | POA: Diagnosis not present

## 2020-04-03 DIAGNOSIS — G629 Polyneuropathy, unspecified: Secondary | ICD-10-CM | POA: Diagnosis not present

## 2020-04-03 DIAGNOSIS — Z79899 Other long term (current) drug therapy: Secondary | ICD-10-CM | POA: Diagnosis not present

## 2020-04-03 DIAGNOSIS — I890 Lymphedema, not elsewhere classified: Secondary | ICD-10-CM | POA: Diagnosis not present

## 2020-04-03 DIAGNOSIS — J439 Emphysema, unspecified: Secondary | ICD-10-CM | POA: Diagnosis not present

## 2020-04-05 DIAGNOSIS — J449 Chronic obstructive pulmonary disease, unspecified: Secondary | ICD-10-CM | POA: Diagnosis not present

## 2020-04-05 DIAGNOSIS — G8929 Other chronic pain: Secondary | ICD-10-CM | POA: Diagnosis not present

## 2020-04-05 DIAGNOSIS — M5442 Lumbago with sciatica, left side: Secondary | ICD-10-CM | POA: Diagnosis not present

## 2020-04-05 DIAGNOSIS — M5441 Lumbago with sciatica, right side: Secondary | ICD-10-CM | POA: Diagnosis not present

## 2020-04-05 DIAGNOSIS — I10 Essential (primary) hypertension: Secondary | ICD-10-CM | POA: Diagnosis not present

## 2020-04-05 DIAGNOSIS — G629 Polyneuropathy, unspecified: Secondary | ICD-10-CM | POA: Diagnosis not present

## 2020-04-05 DIAGNOSIS — M1990 Unspecified osteoarthritis, unspecified site: Secondary | ICD-10-CM | POA: Diagnosis not present

## 2020-04-05 DIAGNOSIS — J309 Allergic rhinitis, unspecified: Secondary | ICD-10-CM | POA: Diagnosis not present

## 2020-04-05 DIAGNOSIS — I872 Venous insufficiency (chronic) (peripheral): Secondary | ICD-10-CM | POA: Diagnosis not present

## 2020-04-09 DIAGNOSIS — M5442 Lumbago with sciatica, left side: Secondary | ICD-10-CM | POA: Diagnosis not present

## 2020-04-09 DIAGNOSIS — I10 Essential (primary) hypertension: Secondary | ICD-10-CM | POA: Diagnosis not present

## 2020-04-09 DIAGNOSIS — I872 Venous insufficiency (chronic) (peripheral): Secondary | ICD-10-CM | POA: Diagnosis not present

## 2020-04-09 DIAGNOSIS — G629 Polyneuropathy, unspecified: Secondary | ICD-10-CM | POA: Diagnosis not present

## 2020-04-09 DIAGNOSIS — J309 Allergic rhinitis, unspecified: Secondary | ICD-10-CM | POA: Diagnosis not present

## 2020-04-09 DIAGNOSIS — M5441 Lumbago with sciatica, right side: Secondary | ICD-10-CM | POA: Diagnosis not present

## 2020-04-09 DIAGNOSIS — M1990 Unspecified osteoarthritis, unspecified site: Secondary | ICD-10-CM | POA: Diagnosis not present

## 2020-04-09 DIAGNOSIS — J449 Chronic obstructive pulmonary disease, unspecified: Secondary | ICD-10-CM | POA: Diagnosis not present

## 2020-04-09 DIAGNOSIS — G8929 Other chronic pain: Secondary | ICD-10-CM | POA: Diagnosis not present

## 2020-04-11 ENCOUNTER — Other Ambulatory Visit: Payer: Self-pay | Admitting: Orthopedic Surgery

## 2020-04-11 DIAGNOSIS — M4807 Spinal stenosis, lumbosacral region: Secondary | ICD-10-CM

## 2020-04-11 DIAGNOSIS — G8929 Other chronic pain: Secondary | ICD-10-CM

## 2020-04-11 DIAGNOSIS — M5442 Lumbago with sciatica, left side: Secondary | ICD-10-CM

## 2020-04-12 DIAGNOSIS — J449 Chronic obstructive pulmonary disease, unspecified: Secondary | ICD-10-CM | POA: Diagnosis not present

## 2020-04-12 DIAGNOSIS — M5441 Lumbago with sciatica, right side: Secondary | ICD-10-CM | POA: Diagnosis not present

## 2020-04-12 DIAGNOSIS — I872 Venous insufficiency (chronic) (peripheral): Secondary | ICD-10-CM | POA: Diagnosis not present

## 2020-04-12 DIAGNOSIS — J309 Allergic rhinitis, unspecified: Secondary | ICD-10-CM | POA: Diagnosis not present

## 2020-04-12 DIAGNOSIS — G8929 Other chronic pain: Secondary | ICD-10-CM | POA: Diagnosis not present

## 2020-04-12 DIAGNOSIS — M1990 Unspecified osteoarthritis, unspecified site: Secondary | ICD-10-CM | POA: Diagnosis not present

## 2020-04-12 DIAGNOSIS — I10 Essential (primary) hypertension: Secondary | ICD-10-CM | POA: Diagnosis not present

## 2020-04-12 DIAGNOSIS — G629 Polyneuropathy, unspecified: Secondary | ICD-10-CM | POA: Diagnosis not present

## 2020-04-12 DIAGNOSIS — M5442 Lumbago with sciatica, left side: Secondary | ICD-10-CM | POA: Diagnosis not present

## 2020-04-17 DIAGNOSIS — G603 Idiopathic progressive neuropathy: Secondary | ICD-10-CM | POA: Diagnosis not present

## 2020-04-17 DIAGNOSIS — R2689 Other abnormalities of gait and mobility: Secondary | ICD-10-CM | POA: Diagnosis not present

## 2020-04-19 DIAGNOSIS — M5441 Lumbago with sciatica, right side: Secondary | ICD-10-CM | POA: Diagnosis not present

## 2020-04-19 DIAGNOSIS — J309 Allergic rhinitis, unspecified: Secondary | ICD-10-CM | POA: Diagnosis not present

## 2020-04-19 DIAGNOSIS — I10 Essential (primary) hypertension: Secondary | ICD-10-CM | POA: Diagnosis not present

## 2020-04-19 DIAGNOSIS — G629 Polyneuropathy, unspecified: Secondary | ICD-10-CM | POA: Diagnosis not present

## 2020-04-19 DIAGNOSIS — M5442 Lumbago with sciatica, left side: Secondary | ICD-10-CM | POA: Diagnosis not present

## 2020-04-19 DIAGNOSIS — J449 Chronic obstructive pulmonary disease, unspecified: Secondary | ICD-10-CM | POA: Diagnosis not present

## 2020-04-19 DIAGNOSIS — M1990 Unspecified osteoarthritis, unspecified site: Secondary | ICD-10-CM | POA: Diagnosis not present

## 2020-04-19 DIAGNOSIS — I872 Venous insufficiency (chronic) (peripheral): Secondary | ICD-10-CM | POA: Diagnosis not present

## 2020-04-19 DIAGNOSIS — G8929 Other chronic pain: Secondary | ICD-10-CM | POA: Diagnosis not present

## 2020-04-26 DIAGNOSIS — I10 Essential (primary) hypertension: Secondary | ICD-10-CM | POA: Diagnosis not present

## 2020-04-26 DIAGNOSIS — M5441 Lumbago with sciatica, right side: Secondary | ICD-10-CM | POA: Diagnosis not present

## 2020-04-26 DIAGNOSIS — I872 Venous insufficiency (chronic) (peripheral): Secondary | ICD-10-CM | POA: Diagnosis not present

## 2020-04-26 DIAGNOSIS — M5442 Lumbago with sciatica, left side: Secondary | ICD-10-CM | POA: Diagnosis not present

## 2020-04-26 DIAGNOSIS — M1990 Unspecified osteoarthritis, unspecified site: Secondary | ICD-10-CM | POA: Diagnosis not present

## 2020-04-26 DIAGNOSIS — J309 Allergic rhinitis, unspecified: Secondary | ICD-10-CM | POA: Diagnosis not present

## 2020-04-26 DIAGNOSIS — G629 Polyneuropathy, unspecified: Secondary | ICD-10-CM | POA: Diagnosis not present

## 2020-04-26 DIAGNOSIS — G8929 Other chronic pain: Secondary | ICD-10-CM | POA: Diagnosis not present

## 2020-04-26 DIAGNOSIS — J449 Chronic obstructive pulmonary disease, unspecified: Secondary | ICD-10-CM | POA: Diagnosis not present

## 2020-04-27 ENCOUNTER — Ambulatory Visit
Admission: RE | Admit: 2020-04-27 | Discharge: 2020-04-27 | Disposition: A | Discharge status: Home / Self Care | Payer: Medicare HMO | Source: Ambulatory Visit | Attending: Orthopedic Surgery | Admitting: Orthopedic Surgery

## 2020-04-27 ENCOUNTER — Other Ambulatory Visit: Payer: Self-pay

## 2020-04-27 DIAGNOSIS — M5441 Lumbago with sciatica, right side: Secondary | ICD-10-CM | POA: Insufficient documentation

## 2020-04-27 DIAGNOSIS — G8929 Other chronic pain: Secondary | ICD-10-CM | POA: Insufficient documentation

## 2020-04-27 DIAGNOSIS — M545 Low back pain, unspecified: Secondary | ICD-10-CM | POA: Diagnosis not present

## 2020-04-27 DIAGNOSIS — M5442 Lumbago with sciatica, left side: Secondary | ICD-10-CM | POA: Insufficient documentation

## 2020-04-27 DIAGNOSIS — M4807 Spinal stenosis, lumbosacral region: Secondary | ICD-10-CM | POA: Diagnosis not present

## 2020-05-03 DIAGNOSIS — G8929 Other chronic pain: Secondary | ICD-10-CM | POA: Diagnosis not present

## 2020-05-03 DIAGNOSIS — I10 Essential (primary) hypertension: Secondary | ICD-10-CM | POA: Diagnosis not present

## 2020-05-03 DIAGNOSIS — J449 Chronic obstructive pulmonary disease, unspecified: Secondary | ICD-10-CM | POA: Diagnosis not present

## 2020-05-03 DIAGNOSIS — G629 Polyneuropathy, unspecified: Secondary | ICD-10-CM | POA: Diagnosis not present

## 2020-05-03 DIAGNOSIS — M5441 Lumbago with sciatica, right side: Secondary | ICD-10-CM | POA: Diagnosis not present

## 2020-05-03 DIAGNOSIS — M5442 Lumbago with sciatica, left side: Secondary | ICD-10-CM | POA: Diagnosis not present

## 2020-05-03 DIAGNOSIS — J309 Allergic rhinitis, unspecified: Secondary | ICD-10-CM | POA: Diagnosis not present

## 2020-05-03 DIAGNOSIS — I872 Venous insufficiency (chronic) (peripheral): Secondary | ICD-10-CM | POA: Diagnosis not present

## 2020-05-03 DIAGNOSIS — M1990 Unspecified osteoarthritis, unspecified site: Secondary | ICD-10-CM | POA: Diagnosis not present

## 2020-06-04 DIAGNOSIS — R300 Dysuria: Secondary | ICD-10-CM | POA: Diagnosis not present

## 2020-06-04 DIAGNOSIS — I4819 Other persistent atrial fibrillation: Secondary | ICD-10-CM | POA: Diagnosis not present

## 2020-06-04 DIAGNOSIS — D6869 Other thrombophilia: Secondary | ICD-10-CM | POA: Diagnosis not present

## 2020-06-04 DIAGNOSIS — E210 Primary hyperparathyroidism: Secondary | ICD-10-CM | POA: Diagnosis not present

## 2020-06-04 DIAGNOSIS — N183 Chronic kidney disease, stage 3 unspecified: Secondary | ICD-10-CM | POA: Diagnosis not present

## 2020-06-04 DIAGNOSIS — J439 Emphysema, unspecified: Secondary | ICD-10-CM | POA: Diagnosis not present

## 2020-06-04 DIAGNOSIS — E261 Secondary hyperaldosteronism: Secondary | ICD-10-CM | POA: Diagnosis not present

## 2020-06-04 DIAGNOSIS — E039 Hypothyroidism, unspecified: Secondary | ICD-10-CM | POA: Diagnosis not present

## 2020-06-04 DIAGNOSIS — G4733 Obstructive sleep apnea (adult) (pediatric): Secondary | ICD-10-CM | POA: Diagnosis not present

## 2020-06-04 DIAGNOSIS — I129 Hypertensive chronic kidney disease with stage 1 through stage 4 chronic kidney disease, or unspecified chronic kidney disease: Secondary | ICD-10-CM | POA: Diagnosis not present

## 2020-06-05 DIAGNOSIS — E039 Hypothyroidism, unspecified: Secondary | ICD-10-CM | POA: Diagnosis not present

## 2020-06-05 DIAGNOSIS — R300 Dysuria: Secondary | ICD-10-CM | POA: Diagnosis not present

## 2020-06-16 DIAGNOSIS — E261 Secondary hyperaldosteronism: Secondary | ICD-10-CM | POA: Insufficient documentation

## 2020-06-19 DIAGNOSIS — R0600 Dyspnea, unspecified: Secondary | ICD-10-CM | POA: Diagnosis not present

## 2020-06-19 DIAGNOSIS — J439 Emphysema, unspecified: Secondary | ICD-10-CM | POA: Diagnosis not present

## 2020-06-19 DIAGNOSIS — E663 Overweight: Secondary | ICD-10-CM | POA: Diagnosis not present

## 2020-06-19 DIAGNOSIS — G4733 Obstructive sleep apnea (adult) (pediatric): Secondary | ICD-10-CM | POA: Diagnosis not present

## 2020-06-21 ENCOUNTER — Other Ambulatory Visit: Payer: Self-pay | Admitting: Nephrology

## 2020-06-21 DIAGNOSIS — I10 Essential (primary) hypertension: Secondary | ICD-10-CM | POA: Diagnosis not present

## 2020-06-21 DIAGNOSIS — R600 Localized edema: Secondary | ICD-10-CM | POA: Diagnosis not present

## 2020-06-21 DIAGNOSIS — N189 Chronic kidney disease, unspecified: Secondary | ICD-10-CM | POA: Insufficient documentation

## 2020-06-21 DIAGNOSIS — E210 Primary hyperparathyroidism: Secondary | ICD-10-CM | POA: Diagnosis not present

## 2020-06-21 DIAGNOSIS — N1832 Chronic kidney disease, stage 3b: Secondary | ICD-10-CM | POA: Diagnosis not present

## 2020-06-26 DIAGNOSIS — D6869 Other thrombophilia: Secondary | ICD-10-CM | POA: Diagnosis not present

## 2020-06-26 DIAGNOSIS — I130 Hypertensive heart and chronic kidney disease with heart failure and stage 1 through stage 4 chronic kidney disease, or unspecified chronic kidney disease: Secondary | ICD-10-CM | POA: Diagnosis not present

## 2020-06-26 DIAGNOSIS — E261 Secondary hyperaldosteronism: Secondary | ICD-10-CM | POA: Diagnosis not present

## 2020-06-26 DIAGNOSIS — G4733 Obstructive sleep apnea (adult) (pediatric): Secondary | ICD-10-CM | POA: Diagnosis not present

## 2020-06-26 DIAGNOSIS — D692 Other nonthrombocytopenic purpura: Secondary | ICD-10-CM | POA: Diagnosis not present

## 2020-06-26 DIAGNOSIS — F330 Major depressive disorder, recurrent, mild: Secondary | ICD-10-CM | POA: Diagnosis not present

## 2020-06-26 DIAGNOSIS — I509 Heart failure, unspecified: Secondary | ICD-10-CM | POA: Diagnosis not present

## 2020-06-26 DIAGNOSIS — I739 Peripheral vascular disease, unspecified: Secondary | ICD-10-CM | POA: Diagnosis not present

## 2020-06-26 DIAGNOSIS — Z6841 Body Mass Index (BMI) 40.0 and over, adult: Secondary | ICD-10-CM | POA: Diagnosis not present

## 2020-06-26 DIAGNOSIS — G8929 Other chronic pain: Secondary | ICD-10-CM | POA: Diagnosis not present

## 2020-06-26 DIAGNOSIS — I480 Paroxysmal atrial fibrillation: Secondary | ICD-10-CM | POA: Diagnosis not present

## 2020-06-26 DIAGNOSIS — E039 Hypothyroidism, unspecified: Secondary | ICD-10-CM | POA: Diagnosis not present

## 2020-07-05 ENCOUNTER — Other Ambulatory Visit: Payer: Self-pay

## 2020-07-05 ENCOUNTER — Emergency Department: Payer: Medicare HMO

## 2020-07-05 ENCOUNTER — Encounter: Payer: Self-pay | Admitting: Radiology

## 2020-07-05 ENCOUNTER — Inpatient Hospital Stay
Admission: EM | Admit: 2020-07-05 | Discharge: 2020-07-10 | DRG: 871 | Disposition: A | Discharge status: Home Health | Payer: Medicare HMO | Attending: Internal Medicine | Admitting: Internal Medicine

## 2020-07-05 DIAGNOSIS — Z79899 Other long term (current) drug therapy: Secondary | ICD-10-CM

## 2020-07-05 DIAGNOSIS — G4733 Obstructive sleep apnea (adult) (pediatric): Secondary | ICD-10-CM | POA: Diagnosis present

## 2020-07-05 DIAGNOSIS — M109 Gout, unspecified: Secondary | ICD-10-CM | POA: Diagnosis not present

## 2020-07-05 DIAGNOSIS — N182 Chronic kidney disease, stage 2 (mild): Secondary | ICD-10-CM

## 2020-07-05 DIAGNOSIS — J449 Chronic obstructive pulmonary disease, unspecified: Secondary | ICD-10-CM | POA: Diagnosis present

## 2020-07-05 DIAGNOSIS — Z9842 Cataract extraction status, left eye: Secondary | ICD-10-CM

## 2020-07-05 DIAGNOSIS — A419 Sepsis, unspecified organism: Secondary | ICD-10-CM

## 2020-07-05 DIAGNOSIS — E890 Postprocedural hypothyroidism: Secondary | ICD-10-CM | POA: Diagnosis present

## 2020-07-05 DIAGNOSIS — A401 Sepsis due to streptococcus, group B: Secondary | ICD-10-CM | POA: Diagnosis not present

## 2020-07-05 DIAGNOSIS — R42 Dizziness and giddiness: Secondary | ICD-10-CM | POA: Diagnosis not present

## 2020-07-05 DIAGNOSIS — I4891 Unspecified atrial fibrillation: Secondary | ICD-10-CM

## 2020-07-05 DIAGNOSIS — E039 Hypothyroidism, unspecified: Secondary | ICD-10-CM | POA: Diagnosis present

## 2020-07-05 DIAGNOSIS — Z882 Allergy status to sulfonamides status: Secondary | ICD-10-CM

## 2020-07-05 DIAGNOSIS — R0602 Shortness of breath: Secondary | ICD-10-CM

## 2020-07-05 DIAGNOSIS — N183 Chronic kidney disease, stage 3 unspecified: Secondary | ICD-10-CM | POA: Diagnosis present

## 2020-07-05 DIAGNOSIS — I890 Lymphedema, not elsewhere classified: Secondary | ICD-10-CM | POA: Diagnosis present

## 2020-07-05 DIAGNOSIS — Z888 Allergy status to other drugs, medicaments and biological substances status: Secondary | ICD-10-CM

## 2020-07-05 DIAGNOSIS — Z961 Presence of intraocular lens: Secondary | ICD-10-CM | POA: Diagnosis present

## 2020-07-05 DIAGNOSIS — Z20822 Contact with and (suspected) exposure to covid-19: Secondary | ICD-10-CM | POA: Diagnosis present

## 2020-07-05 DIAGNOSIS — Z9049 Acquired absence of other specified parts of digestive tract: Secondary | ICD-10-CM

## 2020-07-05 DIAGNOSIS — I130 Hypertensive heart and chronic kidney disease with heart failure and stage 1 through stage 4 chronic kidney disease, or unspecified chronic kidney disease: Secondary | ICD-10-CM | POA: Diagnosis not present

## 2020-07-05 DIAGNOSIS — B9620 Unspecified Escherichia coli [E. coli] as the cause of diseases classified elsewhere: Secondary | ICD-10-CM | POA: Diagnosis present

## 2020-07-05 DIAGNOSIS — R410 Disorientation, unspecified: Secondary | ICD-10-CM | POA: Diagnosis not present

## 2020-07-05 DIAGNOSIS — R531 Weakness: Secondary | ICD-10-CM | POA: Diagnosis not present

## 2020-07-05 DIAGNOSIS — I482 Chronic atrial fibrillation, unspecified: Secondary | ICD-10-CM | POA: Diagnosis present

## 2020-07-05 DIAGNOSIS — I739 Peripheral vascular disease, unspecified: Secondary | ICD-10-CM | POA: Diagnosis not present

## 2020-07-05 DIAGNOSIS — I5033 Acute on chronic diastolic (congestive) heart failure: Secondary | ICD-10-CM | POA: Diagnosis not present

## 2020-07-05 DIAGNOSIS — F32A Depression, unspecified: Secondary | ICD-10-CM | POA: Diagnosis not present

## 2020-07-05 DIAGNOSIS — Z79890 Hormone replacement therapy (postmenopausal): Secondary | ICD-10-CM

## 2020-07-05 DIAGNOSIS — K219 Gastro-esophageal reflux disease without esophagitis: Secondary | ICD-10-CM | POA: Diagnosis not present

## 2020-07-05 DIAGNOSIS — J441 Chronic obstructive pulmonary disease with (acute) exacerbation: Secondary | ICD-10-CM | POA: Diagnosis not present

## 2020-07-05 DIAGNOSIS — J302 Other seasonal allergic rhinitis: Secondary | ICD-10-CM | POA: Diagnosis present

## 2020-07-05 DIAGNOSIS — I517 Cardiomegaly: Secondary | ICD-10-CM | POA: Diagnosis not present

## 2020-07-05 DIAGNOSIS — N189 Chronic kidney disease, unspecified: Secondary | ICD-10-CM | POA: Diagnosis not present

## 2020-07-05 DIAGNOSIS — G4489 Other headache syndrome: Secondary | ICD-10-CM | POA: Diagnosis not present

## 2020-07-05 DIAGNOSIS — Z66 Do not resuscitate: Secondary | ICD-10-CM | POA: Diagnosis not present

## 2020-07-05 DIAGNOSIS — I5032 Chronic diastolic (congestive) heart failure: Secondary | ICD-10-CM | POA: Diagnosis not present

## 2020-07-05 DIAGNOSIS — N390 Urinary tract infection, site not specified: Secondary | ICD-10-CM | POA: Diagnosis not present

## 2020-07-05 DIAGNOSIS — Z9841 Cataract extraction status, right eye: Secondary | ICD-10-CM | POA: Diagnosis not present

## 2020-07-05 DIAGNOSIS — I509 Heart failure, unspecified: Secondary | ICD-10-CM

## 2020-07-05 DIAGNOSIS — Z7982 Long term (current) use of aspirin: Secondary | ICD-10-CM

## 2020-07-05 DIAGNOSIS — E872 Acidosis: Secondary | ICD-10-CM | POA: Diagnosis present

## 2020-07-05 DIAGNOSIS — Z8249 Family history of ischemic heart disease and other diseases of the circulatory system: Secondary | ICD-10-CM

## 2020-07-05 DIAGNOSIS — R0902 Hypoxemia: Secondary | ICD-10-CM | POA: Diagnosis not present

## 2020-07-05 DIAGNOSIS — R7881 Bacteremia: Secondary | ICD-10-CM | POA: Diagnosis not present

## 2020-07-05 DIAGNOSIS — I959 Hypotension, unspecified: Secondary | ICD-10-CM | POA: Diagnosis not present

## 2020-07-05 DIAGNOSIS — R059 Cough, unspecified: Secondary | ICD-10-CM | POA: Diagnosis not present

## 2020-07-05 DIAGNOSIS — I10 Essential (primary) hypertension: Secondary | ICD-10-CM | POA: Diagnosis present

## 2020-07-05 DIAGNOSIS — L03116 Cellulitis of left lower limb: Secondary | ICD-10-CM | POA: Diagnosis not present

## 2020-07-05 LAB — COMPREHENSIVE METABOLIC PANEL
ALT: 18 U/L (ref 0–44)
AST: 27 U/L (ref 15–41)
Albumin: 3.8 g/dL (ref 3.5–5.0)
Alkaline Phosphatase: 66 U/L (ref 38–126)
Anion gap: 8 (ref 5–15)
BUN: 33 mg/dL — ABNORMAL HIGH (ref 8–23)
CO2: 25 mmol/L (ref 22–32)
Calcium: 10.6 mg/dL — ABNORMAL HIGH (ref 8.9–10.3)
Chloride: 101 mmol/L (ref 98–111)
Creatinine, Ser: 1.14 mg/dL — ABNORMAL HIGH (ref 0.44–1.00)
GFR, Estimated: 47 mL/min — ABNORMAL LOW (ref >60)
Glucose, Bld: 102 mg/dL — ABNORMAL HIGH (ref 70–99)
Potassium: 3.9 mmol/L (ref 3.5–5.1)
Sodium: 134 mmol/L — ABNORMAL LOW (ref 135–145)
Total Bilirubin: 1.1 mg/dL (ref 0.3–1.2)
Total Protein: 7 g/dL (ref 6.5–8.1)

## 2020-07-05 LAB — CBC WITH DIFFERENTIAL/PLATELET
Abs Immature Granulocytes: 0.43 10*3/uL — ABNORMAL HIGH (ref 0.00–0.07)
Basophils Absolute: 0.1 10*3/uL (ref 0.0–0.1)
Basophils Relative: 0 %
Eosinophils Absolute: 0.1 10*3/uL (ref 0.0–0.5)
Eosinophils Relative: 0 %
HCT: 35.3 % — ABNORMAL LOW (ref 36.0–46.0)
Hemoglobin: 12 g/dL (ref 12.0–15.0)
Immature Granulocytes: 2 %
Lymphocytes Relative: 3 %
Lymphs Abs: 0.7 10*3/uL (ref 0.7–4.0)
MCH: 30 pg (ref 26.0–34.0)
MCHC: 34 g/dL (ref 30.0–36.0)
MCV: 88.3 fL (ref 80.0–100.0)
Monocytes Absolute: 1.4 10*3/uL — ABNORMAL HIGH (ref 0.1–1.0)
Monocytes Relative: 5 %
Neutro Abs: 25.3 10*3/uL — ABNORMAL HIGH (ref 1.7–7.7)
Neutrophils Relative %: 90 %
Platelets: 233 10*3/uL (ref 150–400)
RBC: 4 MIL/uL (ref 3.87–5.11)
RDW: 13.5 % (ref 11.5–15.5)
Smear Review: NORMAL
WBC: 28 10*3/uL — ABNORMAL HIGH (ref 4.0–10.5)
nRBC: 0 % (ref 0.0–0.2)

## 2020-07-05 LAB — URINALYSIS, COMPLETE (UACMP) WITH MICROSCOPIC
Bilirubin Urine: NEGATIVE
Glucose, UA: NEGATIVE mg/dL
Ketones, ur: NEGATIVE mg/dL
Nitrite: NEGATIVE
Protein, ur: NEGATIVE mg/dL
Specific Gravity, Urine: 1.014 (ref 1.005–1.030)
Squamous Epithelial / HPF: NONE SEEN (ref 0–5)
pH: 5 (ref 5.0–8.0)

## 2020-07-05 LAB — BLOOD CULTURE ID PANEL (REFLEXED) - BCID2

## 2020-07-05 LAB — MAGNESIUM: Magnesium: 1.5 mg/dL — ABNORMAL LOW (ref 1.7–2.4)

## 2020-07-05 LAB — LACTIC ACID, PLASMA
Lactic Acid, Venous: 2.3 mmol/L (ref 0.5–1.9)
Lactic Acid, Venous: 2.6 mmol/L (ref 0.5–1.9)
Lactic Acid, Venous: 1.8 mmol/L (ref 0.5–1.9)
Lactic Acid, Venous: 1.4 mmol/L (ref 0.5–1.9)

## 2020-07-05 LAB — RESP PANEL BY RT-PCR (FLU A&B, COVID) ARPGX2
Influenza A by PCR: NEGATIVE
Influenza B by PCR: NEGATIVE
SARS Coronavirus 2 by RT PCR: NEGATIVE

## 2020-07-05 LAB — PROCALCITONIN: Procalcitonin: 0.63 ng/mL

## 2020-07-05 MED ORDER — AMIODARONE LOAD VIA INFUSION
150.0000 mg | Freq: Once | INTRAVENOUS | Status: DC
  Filled 2020-07-05: qty 83.34

## 2020-07-05 MED ORDER — ONDANSETRON HCL 4 MG/2ML IJ SOLN
4.0000 mg | Freq: Four times a day (QID) | INTRAMUSCULAR | Status: DC | PRN
  Administered 2020-07-08: 4 mg via INTRAVENOUS
  Filled 2020-07-05: qty 2

## 2020-07-05 MED ORDER — LACTATED RINGERS IV SOLN: INTRAVENOUS | Status: DC

## 2020-07-05 MED ORDER — VITAMIN B-12 1000 MCG PO TABS
500.0000 ug | ORAL_TABLET | Freq: Every day | ORAL | Status: DC
  Administered 2020-07-05 – 2020-07-10 (×6): 500 ug via ORAL
  Filled 2020-07-05 (×7): qty 1

## 2020-07-05 MED ORDER — VANCOMYCIN HCL 500 MG/100ML IV SOLN
500.0000 mg | Freq: Once | INTRAVENOUS | Status: AC
  Administered 2020-07-05: 500 mg via INTRAVENOUS
  Filled 2020-07-05: qty 100

## 2020-07-05 MED ORDER — ACETAMINOPHEN 650 MG RE SUPP: 650.0000 mg | Freq: Four times a day (QID) | RECTAL | Status: DC | PRN

## 2020-07-05 MED ORDER — MAGNESIUM SULFATE 2 GM/50ML IV SOLN
2.0000 g | Freq: Once | INTRAVENOUS | Status: AC
  Administered 2020-07-05: 2 g via INTRAVENOUS
  Filled 2020-07-05: qty 50

## 2020-07-05 MED ORDER — CHLORHEXIDINE GLUCONATE CLOTH 2 % EX PADS: 6.0000 | MEDICATED_PAD | Freq: Every day | CUTANEOUS | Status: DC

## 2020-07-05 MED ORDER — PNEUMOCOCCAL VAC POLYVALENT 25 MCG/0.5ML IJ INJ: 0.5000 mL | INJECTION | INTRAMUSCULAR | Status: DC

## 2020-07-05 MED ORDER — ACETAMINOPHEN 500 MG PO TABS
1000.0000 mg | ORAL_TABLET | Freq: Once | ORAL | Status: AC
  Administered 2020-07-05: 1000 mg via ORAL
  Filled 2020-07-05: qty 2

## 2020-07-05 MED ORDER — VITAMIN D 25 MCG (1000 UNIT) PO TABS
1000.0000 [IU] | ORAL_TABLET | Freq: Every day | ORAL | Status: DC
  Administered 2020-07-05 – 2020-07-10 (×6): 1000 [IU] via ORAL
  Filled 2020-07-05 (×8): qty 1

## 2020-07-05 MED ORDER — APIXABAN 5 MG PO TABS
5.0000 mg | ORAL_TABLET | Freq: Two times a day (BID) | ORAL | Status: DC
  Administered 2020-07-05 – 2020-07-10 (×11): 5 mg via ORAL
  Filled 2020-07-05 (×12): qty 1

## 2020-07-05 MED ORDER — AMIODARONE HCL IN DEXTROSE 360-4.14 MG/200ML-% IV SOLN: 30.0000 mg/h | INTRAVENOUS | Status: DC

## 2020-07-05 MED ORDER — METRONIDAZOLE 500 MG/100ML IV SOLN
500.0000 mg | Freq: Once | INTRAVENOUS | Status: AC
  Administered 2020-07-05: 500 mg via INTRAVENOUS
  Filled 2020-07-05: qty 100

## 2020-07-05 MED ORDER — SODIUM CHLORIDE 0.9 % IV SOLN
1.0000 g | INTRAVENOUS | Status: DC
  Filled 2020-07-05: qty 10

## 2020-07-05 MED ORDER — VANCOMYCIN HCL 2000 MG/400ML IV SOLN
2000.0000 mg | Freq: Once | INTRAVENOUS | Status: AC
  Administered 2020-07-05: 2000 mg via INTRAVENOUS
  Filled 2020-07-05: qty 400

## 2020-07-05 MED ORDER — ACETAMINOPHEN 325 MG PO TABS
650.0000 mg | ORAL_TABLET | Freq: Four times a day (QID) | ORAL | Status: DC | PRN
  Administered 2020-07-05 – 2020-07-09 (×6): 650 mg via ORAL
  Filled 2020-07-05 (×6): qty 2

## 2020-07-05 MED ORDER — ADULT MULTIVITAMIN W/MINERALS CH
1.0000 | ORAL_TABLET | Freq: Every day | ORAL | Status: DC
  Administered 2020-07-05 – 2020-07-10 (×6): 1 via ORAL
  Filled 2020-07-05 (×7): qty 1

## 2020-07-05 MED ORDER — CEFEPIME HCL 2 G IJ SOLR
2.0000 g | Freq: Once | INTRAMUSCULAR | Status: AC
  Administered 2020-07-05: 2 g via INTRAVENOUS
  Filled 2020-07-05: qty 2

## 2020-07-05 MED ORDER — MONTELUKAST SODIUM 10 MG PO TABS
10.0000 mg | ORAL_TABLET | Freq: Every day | ORAL | Status: DC
  Administered 2020-07-05 – 2020-07-09 (×5): 10 mg via ORAL
  Filled 2020-07-05 (×6): qty 1

## 2020-07-05 MED ORDER — LEVOTHYROXINE SODIUM 100 MCG PO TABS
200.0000 ug | ORAL_TABLET | Freq: Every day | ORAL | Status: DC
  Administered 2020-07-06 – 2020-07-10 (×5): 200 ug via ORAL
  Filled 2020-07-05 (×5): qty 2

## 2020-07-05 MED ORDER — ALBUTEROL SULFATE HFA 108 (90 BASE) MCG/ACT IN AERS
2.0000 | INHALATION_SPRAY | RESPIRATORY_TRACT | Status: DC | PRN
  Filled 2020-07-05: qty 6.7

## 2020-07-05 MED ORDER — PENICILLIN G POTASSIUM 20000000 UNITS IJ SOLR
4.0000 10*6.[IU] | INTRAVENOUS | Status: DC
  Administered 2020-07-05 – 2020-07-06 (×6): 4 10*6.[IU] via INTRAVENOUS
  Filled 2020-07-05 (×12): qty 4

## 2020-07-05 MED ORDER — LACTATED RINGERS IV BOLUS
1000.0000 mL | Freq: Once | INTRAVENOUS | Status: AC
  Administered 2020-07-05: 1000 mL via INTRAVENOUS

## 2020-07-05 MED ORDER — DULOXETINE HCL 30 MG PO CPEP
60.0000 mg | ORAL_CAPSULE | Freq: Every day | ORAL | Status: DC
  Administered 2020-07-05 – 2020-07-10 (×6): 60 mg via ORAL
  Filled 2020-07-05: qty 2
  Filled 2020-07-05: qty 1
  Filled 2020-07-05 (×5): qty 2

## 2020-07-05 MED ORDER — ONDANSETRON HCL 4 MG PO TABS: 4.0000 mg | ORAL_TABLET | Freq: Four times a day (QID) | ORAL | Status: DC | PRN

## 2020-07-05 MED ORDER — AMIODARONE HCL IN DEXTROSE 360-4.14 MG/200ML-% IV SOLN: 60.0000 mg/h | INTRAVENOUS | Status: DC

## 2020-07-05 MED ORDER — VANCOMYCIN HCL IN DEXTROSE 1-5 GM/200ML-% IV SOLN: 1000.0000 mg | Freq: Once | INTRAVENOUS | Status: DC

## 2020-07-05 MED ORDER — LACTATED RINGERS IV BOLUS
500.0000 mL | Freq: Once | INTRAVENOUS | Status: AC
  Administered 2020-07-05: 500 mL via INTRAVENOUS

## 2020-07-05 NOTE — ED Provider Notes (Signed)
[facility] Emergency Department Provider Note  ____________________________________________  Time seen: Approximately 4:20 AM  I have reviewed the triage vital signs and the nursing notes.   HISTORY  Chief Complaint Dizziness and Weakness   HPI [name] is a 87 y.o. female with history of HPpEF, COPD, asthma, GERD, hypertension, hypothyroidism, OSA not on CPAP, A. fib on Eliquis, lymphedema who presents for evaluation of generalized weakness and fatigue.  Patient reports that she was treated for UTI finishing her treatment 4 days ago.  Last night she started having generalized weakness.  Has been feeling lightheaded.  She also endorses feeling hot and cold throughout the night.  She has had a cough for the last few days.  She reports that her dysuria has resolved.  She denies chest pain or shortness of breath, abdominal pain, flank pain, vomiting, diarrhea.   Past Medical History:  Diagnosis Date  . Arthritis   . Asthma   . CHF (congestive heart failure) ([place])   . COPD (chronic obstructive pulmonary disease) ([place])   . Cough   . GERD (gastroesophageal reflux disease)   . Gout   . Hiatal hernia   . Hypertension   . Hypothyroidism   . Neuropathic pain of right lower extremity   . Peripheral vascular disease ([place])    "poor circulation"  . Seasonal allergies   . Shortness of breath dyspnea   . Sleep apnea    uses CPAP (sometimes)    Patient Active Problem List   Diagnosis Date Noted  . CHF (congestive heart failure) ([place]) 03/27/2017  . Lower extremity ulceration, left, limited to breakdown of skin ([place]) 03/27/2017  . CKD (chronic kidney disease) 02/12/2016  . Swelling of limb 02/12/2016  . Pain in toes of both feet 01/14/2016  . Varicose veins of both lower extremities with pain 01/14/2016  . Lymphedema 01/14/2016    Past Surgical History:  Procedure Laterality Date  . ANKLE ARTHROSCOPY    . BACK SURGERY     L4-L5 Decompression  .  CARDIAC CATHETERIZATION  1995  . CATARACT EXTRACTION W/PHACO Right 07/19/2014   Procedure: CATARACT EXTRACTION PHACO AND INTRAOCULAR LENS PLACEMENT (IOC);  Surgeon: Leandrew Koyanagi, MD;  Location: [place];  Service: Ophthalmology;  Laterality: Right;  . CATARACT EXTRACTION W/PHACO Left 08/23/2014   Procedure: CATARACT EXTRACTION PHACO AND INTRAOCULAR LENS PLACEMENT (IOC);  Surgeon: Leandrew Koyanagi, MD;  Location: [place];  Service: Ophthalmology;  Laterality: Left;  . CHOLECYSTECTOMY    . COLONOSCOPY    . PARATHYROIDECTOMY  2003  . TONSILLECTOMY    . UVULOPALATOPHARYNGOPLASTY      Prior to Admission medications   Medication Sig Start Date End Date Taking? Authorizing Provider  acetaminophen (TYLENOL) 500 MG tablet Take by mouth.    [provider]  amLODipine (NORVASC) 10 MG tablet Take 2.5 mg by mouth daily. AM     [provider]  amoxicillin-clavulanate (AUGMENTIN) 875-125 MG tablet Take 1 tablet by mouth 2 (two) times daily. Patient not taking: Reported on 03/27/2017 05/22/16   Frederich Cha, MD  aspirin 81 MG tablet Take 81 mg by mouth daily. AM    [provider]  benzonatate (TESSALON) 100 MG capsule Take 100 mg by mouth 2 (two) times daily as needed.  03/12/17   [provider]  Butenafine HCl (LOTRIMIN ULTRA) 1 % cream Apply to rash twice a day Patient not taking: Reported on 03/27/2017 12/07/15   Daymon Larsen, MD  Calcium Ascorbate  500 MG TABS Take by mouth.    [provider]  carbamazepine (TEGRETOL) 100 MG chewable tablet Chew by mouth.    [provider]  cetirizine (ZYRTEC) 5 MG tablet Take by mouth. 06/12/17 06/12/18  [provider]  Cholecalciferol (VITAMIN D-1000 MAX ST) 1000 units tablet Take by mouth.    [provider]  diphenhydrAMINE (BENADRYL) 25 mg capsule Take 25 mg by mouth 2 (two) times daily as needed.    [provider]  furosemide (LASIX) 20 MG tablet  Take 20 mg by mouth as needed.  04/29/16   [provider]  furosemide (LASIX) 40 MG tablet Take 40 mg by mouth 2 (two) times daily as needed.    [provider]  levothyroxine (SYNTHROID, LEVOTHROID) 137 MCG tablet Take 137 mcg by mouth.  07/14/14   [provider]  losartan (COZAAR) 100 MG tablet Take 100 mg by mouth daily. AM    [provider]  montelukast (SINGULAIR) 10 MG tablet Take 10 mg by mouth at bedtime.    [provider]  Multiple Vitamin (MULTI-VITAMINS) TABS Take by mouth.    [provider]  naproxen sodium (ANAPROX) 220 MG tablet Take 220 mg by mouth 2 (two) times daily as needed.    [provider]  Omega-3 Fatty Acids (OMEGA 3 PO) Take 1 capsule by mouth every morning. AM     [provider]  omeprazole (PRILOSEC) 20 MG capsule Take 20 mg by mouth as needed.    [provider]  PARoxetine (PAXIL) 20 MG tablet Take 20 mg by mouth daily. AM    [provider]  Phenazopyridine HCl (AZO TABS PO) Take by mouth as needed.    [provider]  VENTOLIN HFA 108 (90 Base) MCG/ACT inhaler INHALE 2 PUFFS BY MOUTH EVERY 6 HOURS AS NEEDED FOR WHEEZE 04/25/17   [provider]    Allergies Prednisone, Sulfa antibiotics, and Sulfacetamide sodium  Family History  Problem Relation Age of Onset  . Cancer Mother   . Hyperlipidemia Son   . Diabetes Son   . Cancer Maternal Grandmother     Social History Social History   Tobacco Use  . Smoking status: Passive Smoke Exposure - Never Smoker  . Smokeless tobacco: Never Used  Substance Use Topics  . Alcohol use: No  . Drug use: No    Review of Systems  Constitutional: Negative for fever. + Generalized weakness, fatigue, chills Eyes: Negative for visual changes. ENT: Negative for sore throat. Neck: No neck pain  Cardiovascular: Negative for chest pain. Respiratory: Negative for shortness of breath. + cough Gastrointestinal:  Negative for abdominal pain, vomiting or diarrhea. Genitourinary: Negative for dysuria. Musculoskeletal: Negative for back pain. Skin: Negative for rash. Neurological: Negative for headaches, weakness or numbness. Psych: No SI or HI  ____________________________________________   PHYSICAL EXAM:  VITAL SIGNS: ED Triage Vitals  Enc Vitals Group     BP 07/05/20 0409 126/64     Pulse Rate 07/05/20 0409 (!) 124     Resp 07/05/20 0409 20     Temp 07/05/20 0409 99.7 F (37.6 C)     Temp Source 07/05/20 0409 Oral     SpO2 07/05/20 0408 94 %     Weight 07/05/20 0414 277 lb 1.9 oz (125.7 kg)     Height 07/05/20 0414 5\' 4"  (1.626 m)     Head Circumference --      Peak Flow --  Pain Score 07/05/20 0411 0     Pain Loc --      Pain Edu? --      Excl. in [place]? --     Constitutional: Alert and oriented. Well appearing and in no apparent distress. HEENT:      Head: Normocephalic and atraumatic.         Eyes: Conjunctivae are normal. Sclera is non-icteric.       Mouth/Throat: Mucous membranes are moist.       Neck: Supple with no signs of meningismus. Cardiovascular: Irregularly irregular rhythm with tachycardic rate Respiratory: Normal respiratory effort. Lungs are clear to auscultation bilaterally.  Gastrointestinal: Soft, non tender, and non distended with positive bowel sounds. No rebound or guarding. Musculoskeletal: Changes of chronic lymphedema with overlying erythema and warmth of the left lower extremity. Neurologic: Normal speech and language. Face is symmetric. Moving all extremities. No gross focal neurologic deficits are appreciated. Skin: Skin is warm, dry and intact. No rash noted. Psychiatric: Mood and affect are normal. Speech and behavior are normal.  ____________________________________________   LABS (all labs ordered are listed, but only abnormal results are displayed)  Labs Reviewed  CBC WITH DIFFERENTIAL/PLATELET - Abnormal; Notable for the following  components:      Result Value   WBC 28.0 (*)    HCT 35.3 (*)    All other components within normal limits  COMPREHENSIVE METABOLIC PANEL - Abnormal; Notable for the following components:   Sodium 134 (*)    Glucose, Bld 102 (*)    BUN 33 (*)    Creatinine, Ser 1.14 (*)    Calcium 10.6 (*)    GFR, Estimated 47 (*)    All other components within normal limits  LACTIC ACID, PLASMA - Abnormal; Notable for the following components:   Lactic Acid, Venous 2.3 (*)    All other components within normal limits  MAGNESIUM - Abnormal; Notable for the following components:   Magnesium 1.5 (*)    All other components within normal limits  RESP PANEL BY RT-PCR (FLU A&B, COVID) ARPGX2  CULTURE, BLOOD (ROUTINE X 2)  CULTURE, BLOOD (ROUTINE X 2)  PROCALCITONIN  LACTIC ACID, PLASMA  URINALYSIS, COMPLETE (UACMP) WITH MICROSCOPIC   ____________________________________________  EKG  ED ECG REPORT I, Rudene Re, the attending physician, personally viewed and interpreted this ECG.  A. fib with a rate of 112, normal QTC, left axis deviation, no ST elevations or depressions.  No significant changes when compared to prior from 2021. ____________________________________________  RADIOLOGY  I have personally reviewed the images performed during this visit and I agree with the Radiologist's read.   Interpretation by Radiologist:  DG Chest Portable 1 View  Result Date: 07/05/2020 CLINICAL DATA:  Cough EXAM: PORTABLE CHEST 1 VIEW COMPARISON:  10/03/2015 FINDINGS: Limited low volume and rotated chest. Stable heart size and mediastinal contours. Interstitial prominence which is similar to before. There is no edema, consolidation, effusion, or pneumothorax. IMPRESSION: Stable compared to 2017.  No acute or focal finding. Electronically Signed   By: Monte Fantasia M.D.   On: 07/05/2020 04:42      ____________________________________________   PROCEDURES  Procedure(s) performed:yes .1-3 Lead  EKG Interpretation Performed by: Rudene Re, MD Authorized by: Rudene Re, MD     Interpretation: abnormal     ECG rate assessment: tachycardic     Rhythm: atrial fibrillation     Critical Care performed: yes  CRITICAL CARE Performed by: Rudene Re  ?  Total critical care time: 24  min  Critical care time was exclusive of separately billable procedures and treating other patients.  Critical care was necessary to treat or prevent imminent or life-threatening deterioration.  Critical care was time spent personally by me on the following activities: development of treatment plan with patient and/or surrogate as well as nursing, discussions with consultants, evaluation of patient's response to treatment, examination of patient, obtaining history from patient or surrogate, ordering and performing treatments and interventions, ordering and review of laboratory studies, ordering and review of radiographic studies, pulse oximetry and re-evaluation of patient's condition.  ____________________________________________   INITIAL IMPRESSION / ASSESSMENT AND PLAN / ED COURSE  87 y.o. female with history of HPpEF, COPD, asthma, GERD, hypertension, hypothyroidism, OSA not on CPAP, A. fib on Eliquis, lymphedema who presents for evaluation of generalized weakness, fatigue, chills, cough, and dizziness.  Patient arrives in A. fib with RVR with a rate in the 120s.  BP of 126/64, and a temp of 99.48F.  Normal work of breathing and normal sats.  She does have changes of chronic venous stasis of bilateral lower extremities with overlying erythema or warmth of the left lower extremity.  According to the patient this erythema and warmth have been there for over a year.  Ddx sepsis (UTI, bacteremia, PNA, cellulitis), afib w/ rvr, electrolyte derangement, AKI/ dehydration, covid, flu.   Plan for labs, CXR, UA, lactic, blood cultures, procalcitonin. Will give tylenol, IV mag and gentle  IV hydration for afib w/ RVR.  Patient placed on telemetry for close monitoring of cardiorespiratory status.  Old medical records reviewed.  _________________________ 5:41 AM on 07/05/2020 -----------------------------------------  Presentation concerning for sepsis based on vital signs, white count of 28, and a lactic acid of 2.3.  Patient is also hypomagnesemic.  She has received 2 g of IV mag.  Chest x-ray visualized by me with no signs of pneumonia, confirmed by radiology.  COVID and flu are pending.  Pro-Cal is pending.  UA is pending.  Most likely urosepsis versus bacteremia from recent UTI.  Patient was covered with cefepime, vancomycin and Flagyl.  Will consult hospitalist for admission.    _________________________ 6:44 AM on 07/05/2020 -----------------------------------------  Urine seems to be the source of infection at this time.  COVID and flu negative.  Procalcitonin is elevated 0.63 consistent with sepsis.  Patient's heart rate is now between mid 90s to low 110s without amiodarone and just fluids.  We will hold amiodarone unless needed.  Blood pressure is also improving.  Patient is admitted to the hospitalist service.  _____________________________________________ Please note:  Patient was evaluated in Emergency Department today for the symptoms described in the history of present illness. Patient was evaluated in the context of the global COVID-19 pandemic, which necessitated consideration that the patient might be at risk for infection with the SARS-CoV-2 virus that causes COVID-19. Institutional protocols and algorithms that pertain to the evaluation of patients at risk for COVID-19 are in a state of rapid change based on information released by regulatory bodies including the CDC and federal and state organizations. These policies and algorithms were followed during the patient's care in the ED.  Some ED evaluations and interventions may be delayed as a result of limited staffing  during the pandemic.   [place] Controlled Substance Database was reviewed by me. ____________________________________________   FINAL CLINICAL IMPRESSION(S) / ED DIAGNOSES   Final diagnoses:  Sepsis, due to unspecified organism, unspecified whether acute organ dysfunction present [facility])  Atrial fibrillation with RVR (HCC)  Hypomagnesemia  NEW MEDICATIONS STARTED DURING THIS VISIT:  ED Discharge Orders    None       Note:  This document was prepared using Dragon voice recognition software and may include unintentional dictation errors.    Alfred Levins, [place], MD 07/05/20 [phone]

## 2020-07-05 NOTE — ED Triage Notes (Signed)
Pt is from a senior apt [employer] trace). Pt had a UTI and was given ATB. Pt states she finished her ATB's on Sunday. Pt complains of generalized weakness, tired and dizzy with a HA.

## 2020-07-05 NOTE — Progress Notes (Signed)
CODE SEPSIS - PHARMACY COMMUNICATION  **Broad Spectrum Antibiotics should be administered within 1 hour of Sepsis diagnosis**  Time Code Sepsis Called/Page Received:  5/12 @ 0516  Antibiotics Ordered: Cefepime 2 gm , Vanc 2500 mg   Time of 1st antibiotic administration: 5/12 @ 0534  Additional action taken by pharmacy:   If necessary, Name of Provider/Nurse Contacted:     Hilde Churchman D ,PharmD Clinical Pharmacist  07/05/2020  5:43 AM

## 2020-07-05 NOTE — H&P (Signed)
History and Physical    [name] IEP:[id number] DOB: 23-Mar-1930 DOA: 07/05/2020  PCP: Ezequiel Kayser, MD   Patient coming from: Home  I have personally briefly reviewed patient's old medical records in [place]  Chief Complaint: Fever and chills  HPI: [name] is a 87 y.o. female with medical history significant for chronic diastolic dysfunction CHF,  GERD, COPD, hypertension, hypothyroidism, chronic A. fib on Eliquis and lymphedema who presents to the ER for evaluation of generalized weakness, fatigue, fever and chills. Patient states that she was recently treated for UTI and completed antibiotic therapy 4 days prior to her admission.  She is unable to tell me what she took.  The night prior to admission she developed nausea, poor oral intake, chills as well as feeling hot and cold throughout the night.  She continues to have dysuria but states that it is improved.  She has a cough which is nonproductive and chronic bilateral lower extremity swelling.  Due to persistence in her symptoms she decided to present to the ER for further evaluation. She denies having any abdominal pain, no chest pain, no palpitations, no diaphoresis, no dizziness, no lightheadedness, no falls, no changes in her bowel habits, no headache, no blurred vision, no focal deficits. Labs show sodium 134, potassium 3.9, chloride 101, bicarb 25, glucose 102, BUN 33, creatinine 1.14, calcium 10.6, magnesium 1.5, alkaline phosphatase 66, albumin 3.8, AST 27, ALT 18, total protein 7.0, lactic acid 2.3, procalcitonin 0.63, white count 28.0, hemoglobin 12.0, hematocrit 35.3, MCV 88.3, RDW 13.5, platelet count 233 Respiratory viral panel is negative Urine analysis shows moderate leukocyte Estrace, negative nitrite, WBC clumps and 11-20 WBCs Chest x-ray reviewed by me shows no acute findings Twelve-lead EKG reviewed by me shows A. fib, left axis deviation and minimal ST depression in the lateral leads.  ED Course:  Patient is an 87 year old female who presents to the ER for evaluation of generalized weakness, fever, chills and nausea.  She recently completed antibiotic therapy for UTI but is unable to tell me what antibiotic she took.  She was tachycardic upon arrival to the ER with heart rate of 124, low-grade fever with a T-max of 99.7.  White count was 28,000 with elevated lactic acid level as well as elevated procalcitonin level.  Patient continues to have pyuria.  She received 2.5L fluid bolus in the ER because her systolic blood pressure was in the 80s with a MAP of 76.  Tachycardia has improved.  Patient will be admitted to the hospital for further evaluation.  Review of Systems: As per HPI otherwise all other systems reviewed and negative.    Past Medical History:  Diagnosis Date  . Arthritis   . Asthma   . CHF (congestive heart failure) ([place])   . COPD (chronic obstructive pulmonary disease) ([place])   . Cough   . GERD (gastroesophageal reflux disease)   . Gout   . Hiatal hernia   . Hypertension   . Hypothyroidism   . Neuropathic pain of right lower extremity   . Peripheral vascular disease ([place])    "poor circulation"  . Seasonal allergies   . Shortness of breath dyspnea   . Sleep apnea    uses CPAP (sometimes)    Past Surgical History:  Procedure Laterality Date  . ANKLE ARTHROSCOPY    . BACK SURGERY     L4-L5 Decompression  . CARDIAC CATHETERIZATION  1995  . CATARACT EXTRACTION W/PHACO Right 07/19/2014   Procedure: CATARACT EXTRACTION PHACO  AND INTRAOCULAR LENS PLACEMENT (IOC);  Surgeon: Leandrew Koyanagi, MD;  Location: [facility];  Service: Ophthalmology;  Laterality: Right;  . CATARACT EXTRACTION W/PHACO Left 08/23/2014   Procedure: CATARACT EXTRACTION PHACO AND INTRAOCULAR LENS PLACEMENT (IOC);  Surgeon: Leandrew Koyanagi, MD;  Location: [facility];  Service: Ophthalmology;  Laterality: Left;  . CHOLECYSTECTOMY    . COLONOSCOPY    . PARATHYROIDECTOMY  2003   . TONSILLECTOMY    . UVULOPALATOPHARYNGOPLASTY       reports that she is a non-smoker but has been exposed to tobacco smoke. She has never used smokeless tobacco. She reports that she does not drink alcohol and does not use drugs.  Allergies  Allergen Reactions  . Aspirin Nausea Only  . Prednisone     Other reaction(s): Other (See Comments), Unknown  . Sulfa Antibiotics Rash  . Sulfacetamide Sodium Rash    Family History  Problem Relation Age of Onset  . Cancer Mother   . Hyperlipidemia Son   . Diabetes Son   . Cancer Maternal Grandmother       Prior to Admission medications   Medication Sig Start Date End Date Taking? Authorizing Provider  vitamin B-12 (CYANOCOBALAMIN) 500 MCG tablet Take 500 mcg by mouth daily.   Yes [provider]  acetaminophen (TYLENOL) 500 MG tablet Take 500 mg by mouth every 4 (four) hours as needed.    [provider]  Cholecalciferol 25 MCG (1000 UT) tablet Take 1,000 Units by mouth daily.    [provider]  diphenhydrAMINE (BENADRYL) 25 mg capsule Take 25 mg by mouth 2 (two) times daily as needed.    [provider]  DULoxetine (CYMBALTA) 60 MG capsule Take 60 mg by mouth daily. 05/04/20   [provider]  ELIQUIS 5 MG TABS tablet Take 1 tablet by mouth 2 (two) times daily. 05/25/20   [provider]  furosemide (LASIX) 20 MG tablet Take 20 mg by mouth as needed.  04/29/16   [provider]  levothyroxine (SYNTHROID) 200 MCG tablet Take 200 mcg by mouth daily. 06/19/20   [provider]  losartan (COZAAR) 50 MG tablet Take 1 tablet by mouth daily. 05/15/20   [provider]  montelukast (SINGULAIR) 10 MG tablet Take 10 mg by mouth at bedtime.    [provider]  Multiple Vitamin (MULTI-VITAMINS) TABS Take by mouth.    [provider]  spironolactone (ALDACTONE) 25 MG tablet Take 1 tablet by mouth daily. 05/25/20   [provider]  triamcinolone  ointment (KENALOG) 0.1 % Apply 1 application topically 4 (four) times daily. 05/15/20   [provider]  VENTOLIN HFA 108 (90 Base) MCG/ACT inhaler Inhale 1-2 puffs into the lungs every 4 (four) hours as needed. 04/25/17   [provider]    Physical Exam: Vitals:   07/05/20 0530 07/05/20 0600 07/05/20 0630 07/05/20 0717  BP: 132/69 110/89 (!) 102/91 (!) 84/55  Pulse: (!) 103 (!) 113 99 (!) 112  Resp: (!) 23 (!) 25 (!) 22 (!) 25  Temp:      TempSrc:      SpO2: 95% 96% 96% 95%  Weight:      Height:         Vitals:   07/05/20 0530 07/05/20 0600 07/05/20 0630 07/05/20 0717  BP: 132/69 110/89 (!) 102/91 (!) 84/55  Pulse: (!) 103 (!) 113 99 (!) 112  Resp: (!) 23 (!) 25 (!) 22 (!) 25  Temp:  TempSrc:      SpO2: 95% 96% 96% 95%  Weight:      Height:          Constitutional: Alert and oriented x 3 . Not in any apparent distress.  Able to speak in full sentences HEENT:      Head: Normocephalic and atraumatic.         Eyes: PERLA, EOMI, Conjunctivae are normal. Sclera is non-icteric.       Mouth/Throat: Mucous membranes are moist.       Neck: Supple with no signs of meningismus. Cardiovascular:  Irregularly irregular. No murmurs, gallops, or rubs. 2+ symmetrical distal pulses are present . No JVD. 3+ LE edema Respiratory: Respiratory effort normal .Lungs sounds clear bilaterally. No wheezes, crackles, or rhonchi.  Gastrointestinal: Soft, non tender, and non distended with positive bowel sounds.  Genitourinary: No CVA tenderness. Musculoskeletal: Nontender with normal range of motion in all extremities. No cyanosis, or erythema of extremities. Neurologic:  Face is symmetric. Moving all extremities. No gross focal neurologic deficits . Skin: Skin is warm, dry.  Chronic redness over both lower extremities.  Chronic lymphedema Psychiatric: Mood and affect are normal   Labs on Admission: I have personally reviewed following labs and imaging studies  CBC: Recent  Labs  Lab 07/05/20 0416  WBC 28.0*  NEUTROABS 25.3*  HGB 12.0  HCT 35.3*  MCV 88.3  PLT 696   Basic Metabolic Panel: Recent Labs  Lab 07/05/20 0416  NA 134*  K 3.9  CL 101  CO2 25  GLUCOSE 102*  BUN 33*  CREATININE 1.14*  CALCIUM 10.6*  MG 1.5*   GFR: Estimated Creatinine Clearance: 45.6 mL/min (A) (by C-G formula based on SCr of 1.14 mg/dL (H)). Liver Function Tests: Recent Labs  Lab 07/05/20 0416  AST 27  ALT 18  ALKPHOS 66  BILITOT 1.1  PROT 7.0  ALBUMIN 3.8   No results for input(s): LIPASE, AMYLASE in the last 168 hours. No results for input(s): AMMONIA in the last 168 hours. Coagulation Profile: No results for input(s): INR, PROTIME in the last 168 hours. Cardiac Enzymes: No results for input(s): CKTOTAL, CKMB, CKMBINDEX, TROPONINI in the last 168 hours. BNP (last 3 results) No results for input(s): PROBNP in the last 8760 hours. HbA1C: No results for input(s): HGBA1C in the last 72 hours. CBG: No results for input(s): GLUCAP in the last 168 hours. Lipid Profile: No results for input(s): CHOL, HDL, LDLCALC, TRIG, CHOLHDL, LDLDIRECT in the last 72 hours. Thyroid Function Tests: No results for input(s): TSH, T4TOTAL, FREET4, T3FREE, THYROIDAB in the last 72 hours. Anemia Panel: No results for input(s): VITAMINB12, FOLATE, FERRITIN, TIBC, IRON, RETICCTPCT in the last 72 hours. Urine analysis:    Component Value Date/Time   COLORURINE YELLOW (A) 07/05/2020 0524   APPEARANCEUR HAZY (A) 07/05/2020 0524   APPEARANCEUR Clear 01/19/2013 1248   LABSPEC 1.014 07/05/2020 0524   LABSPEC 1.004 01/19/2013 1248   PHURINE 5.0 07/05/2020 0524   GLUCOSEU NEGATIVE 07/05/2020 0524   GLUCOSEU Negative 01/19/2013 1248   HGBUR SMALL (A) 07/05/2020 0524   BILIRUBINUR NEGATIVE 07/05/2020 0524   BILIRUBINUR Negative 01/19/2013 [place] 07/05/2020 0524   PROTEINUR NEGATIVE 07/05/2020 0524   NITRITE NEGATIVE 07/05/2020 0524   LEUKOCYTESUR MODERATE (A)  07/05/2020 0524   LEUKOCYTESUR Negative 01/19/2013 1248    Radiological Exams on Admission: DG Chest Portable 1 View  Result Date: 07/05/2020 CLINICAL DATA:  Cough EXAM: PORTABLE CHEST 1 VIEW COMPARISON:  10/03/2015  FINDINGS: Limited low volume and rotated chest. Stable heart size and mediastinal contours. Interstitial prominence which is similar to before. There is no edema, consolidation, effusion, or pneumothorax. IMPRESSION: Stable compared to 2017.  No acute or focal finding. Electronically Signed   By: Monte Fantasia M.D.   On: 07/05/2020 04:42     Assessment/Plan Principal Problem:   Sepsis secondary to UTI [facility]) Active Problems:   Lymphedema   CHF (congestive heart failure) ([facility])   COPD (chronic obstructive pulmonary disease) ([facility])   Hypothyroidism   Hypertension   CKD (chronic kidney disease) stage 3, GFR 30-59 ml/min ([facility])     Sepsis from UTI Patient presented for evaluation of nausea, weakness, fever and chills She was recently treated for a UTI Urine culture from 04/21 yielded E coli which is sensitive to cephalosporins Continue IVF hydration Will start patient on Rocephin 1 g IV daily Follow-up results of blood and urine    Chronic diastolic dysfunction CHF Hold furosemide, losartan and spironolactone due to relative hypotension    Chronic atrial fibrillation Rate controlled Continue Eliquis as primary prophylaxis for an acute stroke    COPD Stable and not acutely exacerbated Continue as needed bronchodilator therapy as well as singulair     Hypothyroidism Continue Synthroid    DVT prophylaxis: Eliquis Code Status: DO NOT RESUSCITATE Family Communication: Greater than 50% of time was spent discussing patient's condition and plan of care with her at the bedside.  All questions and concerns have been addressed.  She verbalizes understanding and agrees to the plan.  She lists her daughter Marthe Patch as her healthcare power of attorney.  CODE  STATUS was discussed and she is a DO NOT RESUSCITATE Disposition Plan: Back to previous home environment Consults called: none Status: At the time of admission, it appears that the appropriate admission status for this patient is inpatient. This is judged to be reasonable and necessary in order to provide the required intensity of service to ensure the patient's safety given the presenting symptoms, physical exam findings and initial radiographic and laboratory data in the context of their comorbid conditions. Patient requires inpatient status due to high intensity of service, high risk for further deterioration and high frequency of surveillance required.   Collier Bullock MD [facility]     07/05/2020, 8:24 AM

## 2020-07-05 NOTE — Progress Notes (Signed)
PHARMACY -  BRIEF ANTIBIOTIC NOTE   Pharmacy has received consult(s) for Vancomycin, Cefepime from an ED provider.  The patient's profile has been reviewed for ht/wt/allergies/indication/available labs.    One time order(s) placed for Vancomycin 2500 mg IV X 1 and cefepime 2 gm IV X 1.   Further antibiotics/pharmacy consults should be ordered by admitting physician if indicated.                       Thank you, Blaise Palladino D 07/05/2020  5:30 AM

## 2020-07-05 NOTE — ED Notes (Signed)
Purewick in place that became dislodged. Full bed changed due to urine saturation, new chux and depends and purewick placed.

## 2020-07-05 NOTE — Sepsis Progress Note (Signed)
Monitoring for the code sepsis protocol. °

## 2020-07-05 NOTE — Progress Notes (Signed)
PHARMACY - PHYSICIAN COMMUNICATION CRITICAL VALUE ALERT - BLOOD CULTURE IDENTIFICATION (BCID)  [name] is an 87 y.o. female who presented to [facility] on 07/05/2020 with a chief complaint of fever and chills  Assessment:  4/4 bottles GPC, Strep species, Streptococcus agalactiae  Name of physician (or Provider) Contacted: [place]  Current antibiotics: CTX  Changes to prescribed antibiotics recommended:  Recommendations accepted by provider  Will change to Penicillin 8million units IV q4h  Results for orders placed or performed during the hospital encounter of 07/05/20  Blood Culture ID Panel (Reflexed) (Collected: 07/05/2020  4:10 AM)  Result Value Ref Range   Enterococcus faecalis NOT DETECTED NOT DETECTED   Enterococcus Faecium NOT DETECTED NOT DETECTED   Listeria monocytogenes NOT DETECTED NOT DETECTED   Staphylococcus species NOT DETECTED NOT DETECTED   Staphylococcus aureus (BCID) NOT DETECTED NOT DETECTED   Staphylococcus epidermidis NOT DETECTED NOT DETECTED   Staphylococcus lugdunensis NOT DETECTED NOT DETECTED   Streptococcus species DETECTED (A) NOT DETECTED   Streptococcus agalactiae DETECTED (A) NOT DETECTED   Streptococcus pneumoniae NOT DETECTED NOT DETECTED   Streptococcus pyogenes NOT DETECTED NOT DETECTED   A.calcoaceticus-baumannii NOT DETECTED NOT DETECTED   Bacteroides fragilis NOT DETECTED NOT DETECTED   Enterobacterales NOT DETECTED NOT DETECTED   Enterobacter cloacae complex NOT DETECTED NOT DETECTED   Escherichia coli NOT DETECTED NOT DETECTED   Klebsiella aerogenes NOT DETECTED NOT DETECTED   Klebsiella oxytoca NOT DETECTED NOT DETECTED   Klebsiella pneumoniae NOT DETECTED NOT DETECTED   Proteus species NOT DETECTED NOT DETECTED   Salmonella species NOT DETECTED NOT DETECTED   Serratia marcescens NOT DETECTED NOT DETECTED   Haemophilus influenzae NOT DETECTED NOT DETECTED   Neisseria meningitidis NOT DETECTED NOT DETECTED   Pseudomonas  aeruginosa NOT DETECTED NOT DETECTED   Stenotrophomonas maltophilia NOT DETECTED NOT DETECTED   Candida albicans NOT DETECTED NOT DETECTED   Candida auris NOT DETECTED NOT DETECTED   Candida glabrata NOT DETECTED NOT DETECTED   Candida krusei NOT DETECTED NOT DETECTED   Candida parapsilosis NOT DETECTED NOT DETECTED   Candida tropicalis NOT DETECTED NOT DETECTED   Cryptococcus neoformans/gattii NOT DETECTED NOT DETECTED    Malena Timpone A Amel Kitch 07/05/2020  4:02 PM

## 2020-07-05 NOTE — Progress Notes (Signed)
Notified bedside nurse of need to draw repeat lactic acid. 

## 2020-07-05 NOTE — Progress Notes (Signed)
Notified provider of need to order repeat lactic acid. ° °

## 2020-07-05 NOTE — ED Notes (Signed)
Kennyth Lose RN aware of assigned bed

## 2020-07-05 NOTE — ED Notes (Signed)
Pt resting comfortably at this time. NAD noted. Pt denies any further needs at this time. Call bell in reach. LR done infusing at this time.   Flagyl done infusing, vanco started.

## 2020-07-06 ENCOUNTER — Inpatient Hospital Stay: Payer: Medicare HMO

## 2020-07-06 DIAGNOSIS — A401 Sepsis due to streptococcus, group B: Principal | ICD-10-CM

## 2020-07-06 DIAGNOSIS — I482 Chronic atrial fibrillation, unspecified: Secondary | ICD-10-CM

## 2020-07-06 DIAGNOSIS — E039 Hypothyroidism, unspecified: Secondary | ICD-10-CM

## 2020-07-06 DIAGNOSIS — J449 Chronic obstructive pulmonary disease, unspecified: Secondary | ICD-10-CM

## 2020-07-06 DIAGNOSIS — F32A Depression, unspecified: Secondary | ICD-10-CM

## 2020-07-06 DIAGNOSIS — I890 Lymphedema, not elsewhere classified: Secondary | ICD-10-CM

## 2020-07-06 DIAGNOSIS — I5033 Acute on chronic diastolic (congestive) heart failure: Secondary | ICD-10-CM

## 2020-07-06 DIAGNOSIS — I5032 Chronic diastolic (congestive) heart failure: Secondary | ICD-10-CM

## 2020-07-06 LAB — BASIC METABOLIC PANEL
Anion gap: 7 (ref 5–15)
BUN: 33 mg/dL — ABNORMAL HIGH (ref 8–23)
CO2: 22 mmol/L (ref 22–32)
Calcium: 9.9 mg/dL (ref 8.9–10.3)
Chloride: 103 mmol/L (ref 98–111)
Creatinine, Ser: 1.15 mg/dL — ABNORMAL HIGH (ref 0.44–1.00)
GFR, Estimated: 46 mL/min — ABNORMAL LOW (ref >60)
Glucose, Bld: 119 mg/dL — ABNORMAL HIGH (ref 70–99)
Potassium: 3.8 mmol/L (ref 3.5–5.1)
Sodium: 132 mmol/L — ABNORMAL LOW (ref 135–145)

## 2020-07-06 LAB — CBC
HCT: 29.5 % — ABNORMAL LOW (ref 36.0–46.0)
Hemoglobin: 9.8 g/dL — ABNORMAL LOW (ref 12.0–15.0)
MCH: 29.4 pg (ref 26.0–34.0)
MCHC: 33.2 g/dL (ref 30.0–36.0)
MCV: 88.6 fL (ref 80.0–100.0)
Platelets: 173 10*3/uL (ref 150–400)
RBC: 3.33 MIL/uL — ABNORMAL LOW (ref 3.87–5.11)
RDW: 13.9 % (ref 11.5–15.5)
WBC: 17 10*3/uL — ABNORMAL HIGH (ref 4.0–10.5)
nRBC: 0 % (ref 0.0–0.2)

## 2020-07-06 LAB — PROTIME-INR
INR: 1.8 — ABNORMAL HIGH (ref 0.8–1.2)
Prothrombin Time: 21.2 seconds — ABNORMAL HIGH (ref 11.4–15.2)

## 2020-07-06 LAB — PROCALCITONIN: Procalcitonin: 2.32 ng/mL

## 2020-07-06 LAB — CORTISOL-AM, BLOOD: Cortisol - AM: 9.7 ug/dL (ref 6.7–22.6)

## 2020-07-06 MED ORDER — MAGNESIUM SULFATE 2 GM/50ML IV SOLN
2.0000 g | Freq: Once | INTRAVENOUS | Status: AC
  Administered 2020-07-06: 2 g via INTRAVENOUS
  Filled 2020-07-06: qty 50

## 2020-07-06 MED ORDER — FUROSEMIDE 20 MG PO TABS
20.0000 mg | ORAL_TABLET | Freq: Every day | ORAL | Status: DC
  Administered 2020-07-06 – 2020-07-07 (×2): 20 mg via ORAL
  Filled 2020-07-06 (×2): qty 1

## 2020-07-06 MED ORDER — IPRATROPIUM-ALBUTEROL 0.5-2.5 (3) MG/3ML IN SOLN
3.0000 mL | Freq: Three times a day (TID) | RESPIRATORY_TRACT | Status: DC
  Administered 2020-07-06 – 2020-07-10 (×12): 3 mL via RESPIRATORY_TRACT
  Filled 2020-07-06 (×12): qty 3

## 2020-07-06 MED ORDER — SODIUM CHLORIDE 0.9 % IV SOLN
2.0000 g | INTRAVENOUS | Status: DC
  Administered 2020-07-06: 2 g via INTRAVENOUS
  Filled 2020-07-06: qty 2
  Filled 2020-07-06: qty 20

## 2020-07-06 MED ORDER — GUAIFENESIN-DM 100-10 MG/5ML PO SYRP
5.0000 mL | ORAL_SOLUTION | ORAL | Status: DC | PRN
  Administered 2020-07-06 – 2020-07-10 (×8): 5 mL via ORAL
  Filled 2020-07-06 (×8): qty 5

## 2020-07-06 MED ORDER — IPRATROPIUM-ALBUTEROL 0.5-2.5 (3) MG/3ML IN SOLN
3.0000 mL | Freq: Three times a day (TID) | RESPIRATORY_TRACT | Status: DC
  Administered 2020-07-06 (×2): 3 mL via RESPIRATORY_TRACT
  Filled 2020-07-06 (×2): qty 3

## 2020-07-06 NOTE — Plan of Care (Signed)

## 2020-07-06 NOTE — Progress Notes (Signed)
Patient ID: [name], female   DOB: 1930-09-26, 87 y.o.   MRN: [id number] [facility] PROGRESS NOTE  [name] WUJ:[id number] DOB: 10-Jun-1930 DOA: 07/05/2020 PCP: Ezequiel Kayser, MD  HPI/Subjective: Patient has chronic lymphedema.  She came in with fever and chills.  Blood cultures positive for Streptococcus agalactiae.  Patient complains of some shortness of breath.  Objective: Vitals:   07/06/20 0807 07/06/20 1122  BP: (!) 100/52 118/67  Pulse: 88 72  Resp: 19 19  Temp: 98.1 F (36.7 C) 97.9 F (36.6 C)  SpO2: 93% 97%    Intake/Output Summary (Last 24 hours) at 07/06/2020 1422 Last data filed at 07/06/2020 1330 Gross per 24 hour  Intake 1268.84 ml  Output 500 ml  Net 768.84 ml   Filed Weights   07/05/20 0414 07/06/20 0450  Weight: 125.7 kg 132.9 kg    ROS: Review of Systems  Respiratory: Positive for cough and shortness of breath.   Cardiovascular: Negative for chest pain.  Gastrointestinal: Negative for abdominal pain, nausea and vomiting.  Skin: Positive for itching and rash.   Exam: Physical Exam HENT:     Head: Normocephalic.     Mouth/Throat:     Pharynx: No oropharyngeal exudate.  Eyes:     General: Lids are normal.     Conjunctiva/sclera: Conjunctivae normal.     Pupils: Pupils are equal, round, and reactive to light.  Cardiovascular:     Rate and Rhythm: Normal rate and regular rhythm.     Heart sounds: Normal heart sounds, S1 normal and S2 normal.  Pulmonary:     Breath sounds: Examination of the right-middle field reveals decreased breath sounds. Examination of the left-middle field reveals decreased breath sounds. Examination of the right-lower field reveals decreased breath sounds. Examination of the left-lower field reveals decreased breath sounds. Decreased breath sounds present. No wheezing, rhonchi or rales.  Abdominal:     Palpations: Abdomen is soft.     Tenderness: There is no abdominal tenderness.  Musculoskeletal:     Right  lower leg: Swelling present.     Left lower leg: Swelling present.     Right ankle: Swelling present.     Left ankle: Swelling present.  Skin:    General: Skin is warm.     Comments: Erythema and warmth left lower extremity anteriorly.  Only slight erythema on the right lower extremity.  Numerous secondary excoriations over her shoulders upper chest and arms.  Neurological:     Mental Status: She is alert and oriented to person, place, and time.       Data Reviewed: Basic Metabolic Panel: Recent Labs  Lab 07/05/20 0416 07/06/20 0420  NA 134* 132*  K 3.9 3.8  CL 101 103  CO2 25 22  GLUCOSE 102* 119*  BUN 33* 33*  CREATININE 1.14* 1.15*  CALCIUM 10.6* 9.9  MG 1.5*  --    Liver Function Tests: Recent Labs  Lab 07/05/20 0416  AST 27  ALT 18  ALKPHOS 66  BILITOT 1.1  PROT 7.0  ALBUMIN 3.8   CBC: Recent Labs  Lab 07/05/20 0416 07/06/20 0420  WBC 28.0* 17.0*  NEUTROABS 25.3*  --   HGB 12.0 9.8*  HCT 35.3* 29.5*  MCV 88.3 88.6  PLT 233 173     Recent Results (from the past 240 hour(s))  Blood culture (routine x 2)     Status: Abnormal (Preliminary result)   Collection Time: 07/05/20  4:10 AM   Specimen: BLOOD  Result  Value Ref Range Status   Specimen Description   Final    BLOOD RIGHT ANTECUBITAL Performed at [facility], [address] [place], [place] [address]    Special Requests   Final    BOTTLES DRAWN AEROBIC AND ANAEROBIC Blood Culture adequate volume Performed at [facility], [address] [place], [place] [address]    Culture  Setup Time   Final    Organism ID to follow IN BOTH AEROBIC AND ANAEROBIC BOTTLES GRAM POSITIVE COCCI CRITICAL RESULT CALLED TO, READ BACK BY AND VERIFIED WITH: WALLD NAZARI @1544  ON 07/05/20.SH Performed at [facility], [address] [place], [place] [address]    Culture (A)  Final    STREPTOCOCCUS AGALACTIAE SUSCEPTIBILITIES TO FOLLOW Performed at [facility],  [place] [address], [place], [place] [address]    Report Status PENDING  Incomplete  Blood Culture ID Panel (Reflexed)     Status: Abnormal   Collection Time: 07/05/20  4:10 AM  Result Value Ref Range Status   Enterococcus faecalis NOT DETECTED NOT DETECTED Final   Enterococcus Faecium NOT DETECTED NOT DETECTED Final   Listeria monocytogenes NOT DETECTED NOT DETECTED Final   Staphylococcus species NOT DETECTED NOT DETECTED Final   Staphylococcus aureus (BCID) NOT DETECTED NOT DETECTED Final   Staphylococcus epidermidis NOT DETECTED NOT DETECTED Final   Staphylococcus lugdunensis NOT DETECTED NOT DETECTED Final   Streptococcus species DETECTED (A) NOT DETECTED Final    Comment: CRITICAL RESULT CALLED TO, READ BACK BY AND VERIFIED WITH: WALLD NAZARI @1544  ON 07/05/20.SH    Streptococcus agalactiae DETECTED (A) NOT DETECTED Final    Comment: CRITICAL RESULT CALLED TO, READ BACK BY AND VERIFIED WITH: WALLD NAZARI @1544  ON 07/05/20.SH    Streptococcus pneumoniae NOT DETECTED NOT DETECTED Final   Streptococcus pyogenes NOT DETECTED NOT DETECTED Final   A.calcoaceticus-baumannii NOT DETECTED NOT DETECTED Final   Bacteroides fragilis NOT DETECTED NOT DETECTED Final   Enterobacterales NOT DETECTED NOT DETECTED Final   Enterobacter cloacae complex NOT DETECTED NOT DETECTED Final   Escherichia coli NOT DETECTED NOT DETECTED Final   Klebsiella aerogenes NOT DETECTED NOT DETECTED Final   Klebsiella oxytoca NOT DETECTED NOT DETECTED Final   Klebsiella pneumoniae NOT DETECTED NOT DETECTED Final   Proteus species NOT DETECTED NOT DETECTED Final   Salmonella species NOT DETECTED NOT DETECTED Final   Serratia marcescens NOT DETECTED NOT DETECTED Final   Haemophilus influenzae NOT DETECTED NOT DETECTED Final   Neisseria meningitidis NOT DETECTED NOT DETECTED Final   Pseudomonas aeruginosa NOT DETECTED NOT DETECTED Final   Stenotrophomonas maltophilia NOT DETECTED NOT DETECTED Final   Candida albicans NOT  DETECTED NOT DETECTED Final   Candida auris NOT DETECTED NOT DETECTED Final   Candida glabrata NOT DETECTED NOT DETECTED Final   Candida krusei NOT DETECTED NOT DETECTED Final   Candida parapsilosis NOT DETECTED NOT DETECTED Final   Candida tropicalis NOT DETECTED NOT DETECTED Final   Cryptococcus neoformans/gattii NOT DETECTED NOT DETECTED Final    Comment: Performed at [facility], [address] [place], [place] [address]  Blood culture (routine x 2)     Status: Abnormal (Preliminary result)   Collection Time: 07/05/20  4:16 AM   Specimen: BLOOD  Result Value Ref Range Status   Specimen Description   Final    BLOOD LEFT ANTECUBITAL Performed at [facility], [address] [place],  [address]    Special Requests   Final    BOTTLES  DRAWN AEROBIC AND ANAEROBIC Blood Culture adequate volume Performed at [facility], [address] [place], [place] [address]    Culture  Setup Time   Final    GRAM POSITIVE COCCI IN BOTH AEROBIC AND ANAEROBIC BOTTLES CRITICAL VALUE NOTED.  VALUE IS CONSISTENT WITH PREVIOUSLY REPORTED AND CALLED VALUE. Performed at [facility], [address] [place], [place] [address]    Culture STREPTOCOCCUS AGALACTIAE (A)  Final   Report Status PENDING  Incomplete  Resp Panel by RT-PCR (Flu A&B, Covid) Nasopharyngeal Swab     Status: None   Collection Time: 07/05/20  4:18 AM   Specimen: Nasopharyngeal Swab; Nasopharyngeal(NP) swabs in vial transport medium  Result Value Ref Range Status   SARS Coronavirus 2 by RT PCR NEGATIVE NEGATIVE Final    Comment: (NOTE) SARS-CoV-2 target nucleic acids are NOT DETECTED.  The SARS-CoV-2 RNA is generally detectable in upper respiratory specimens during the acute phase of infection. The lowest concentration of SARS-CoV-2 viral copies this assay can detect is 138 copies/mL. A negative result does not preclude SARS-Cov-2 infection and should not be used as the sole basis for  treatment or other patient management decisions. A negative result may occur with  improper specimen collection/handling, submission of specimen other than nasopharyngeal swab, presence of viral mutation(s) within the areas targeted by this assay, and inadequate number of viral copies(<138 copies/mL). A negative result must be combined with clinical observations, patient history, and epidemiological information. The expected result is Negative.  Fact Sheet for Patients:  BloggerCourse.com  Fact Sheet for Healthcare Providers:  SeriousBroker.it  This test is no t yet approved or cleared by the [place] FDA and  has been authorized for detection and/or diagnosis of SARS-CoV-2 by FDA under an Emergency Use Authorization (EUA). This EUA will remain  in effect (meaning this test can be used) for the duration of the COVID-19 declaration under Section 564(b)(1) of the Act, 21 U.S.C.section 360bbb-3(b)(1), unless the authorization is terminated  or revoked sooner.       Influenza A by PCR NEGATIVE NEGATIVE Final   Influenza B by PCR NEGATIVE NEGATIVE Final    Comment: (NOTE) The Xpert Xpress SARS-CoV-2/FLU/RSV plus assay is intended as an aid in the diagnosis of influenza from Nasopharyngeal swab specimens and should not be used as a sole basis for treatment. Nasal washings and aspirates are unacceptable for Xpert Xpress SARS-CoV-2/FLU/RSV testing.  Fact Sheet for Patients: BloggerCourse.com  Fact Sheet for Healthcare Providers: SeriousBroker.it  This test is not yet approved or cleared by the [place] FDA and has been authorized for detection and/or diagnosis of SARS-CoV-2 by FDA under an Emergency Use Authorization (EUA). This EUA will remain in effect (meaning this test can be used) for the duration of the COVID-19 declaration under Section 564(b)(1) of the Act, 21  U.S.C. section 360bbb-3(b)(1), unless the authorization is terminated or revoked.  Performed at [facility], [address], [place], [place] [address]   Urine culture     Status: Abnormal (Preliminary result)   Collection Time: 07/05/20  5:24 AM   Specimen: Urine, Random  Result Value Ref Range Status   Specimen Description   Final    URINE, RANDOM Performed at [facility], [address], [place], [place] [address]    Special Requests   Final    NONE Performed at [facility], [address] [place], [place] [address]    Culture >=100,000 COLONIES/mL ESCHERICHIA COLI (A)  Final  Report Status PENDING  Incomplete     Studies: US RENAL  Result Date: 07/06/2020 CLINICAL DATA:  Chronic kidney disease EXAM: RENAL / URINARY TRACT ULTRASOUND COMPLETE COMPARISON:  None. FINDINGS: Right Kidney: Renal measurements: 10.5 x 4.5 x 5.0 cm = volume: 124 mL. Normal echogenicity. Cortical thinning noted. No hydronephrosis or acute finding. No focal abnormality or mass by ultrasound. Left Kidney: Renal measurements: 10.2 x 5.1 x 4.7 cm = volume: 128 mL. Normal echogenicity. More preserved renal cortex. No acute finding or hydronephrosis. No focal abnormality. Bladder: Appears normal for degree of bladder distention. Ureteral jets not demonstrated. Other: No free fluid. IMPRESSION: No acute finding by renal ultrasound. Right kidney cortical thinning noted. Electronically Signed   By: Jerilynn Mages.  Shick M.D.   On: 07/06/2020 11:19   DG Chest Portable 1 View  Result Date: 07/05/2020 CLINICAL DATA:  Cough EXAM: PORTABLE CHEST 1 VIEW COMPARISON:  10/03/2015 FINDINGS: Limited low volume and rotated chest. Stable heart size and mediastinal contours. Interstitial prominence which is similar to before. There is no edema, consolidation, effusion, or pneumothorax. IMPRESSION: Stable compared to 2017.  No acute or focal finding. Electronically Signed   By: Monte Fantasia M.D.   On:  07/05/2020 04:42    Scheduled Meds: . apixaban  5 mg Oral BID  . Chlorhexidine Gluconate Cloth  6 each Topical Daily  . cholecalciferol  1,000 Units Oral Daily  . DULoxetine  60 mg Oral Daily  . furosemide  20 mg Oral Daily  . ipratropium-albuterol  3 mL Nebulization TID  . levothyroxine  200 mcg Oral Q0600  . montelukast  10 mg Oral QHS  . multivitamin with minerals  1 tablet Oral Daily  . pneumococcal 23 valent vaccine  0.5 mL Intramuscular Tomorrow-1000  . vitamin B-12  500 mcg Oral Daily   Continuous Infusions: . pencillin G potassium IV 4 Million Units (07/06/20 1252)    Assessment/Plan:  1. Sepsis, present on admission.  Left lower extremity cellulitis.  Strep agalactiae growing out of blood cultures.  Tachycardic to 124 on presentation, white blood cell count 28.0 on presentation.  Antibiotics changed from Rocephin  to penicillin G.  We will get infectious disease consultation.  Urine culture growing E. Coli.  Patient also had lactic acidosis peaking at 2.6. 2. Chronic lymphedema.  Uses lymphedema treatments at night at home. 3. Chronic diastolic congestive heart failure.  Patient with shortness of breath.  Restart low-dose torsemide and stop IV fluids.  Holding losartan and spironolactone with relative hypotension. 4. Chronic atrial fibrillation on Eliquis for anticoagulation. 5. COPD.  Started nebulizer treatments since having poor air entry.  Trying to hold off on steroids with sepsis. 6. Hypothyroidism unspecified on levothyroxine 7. Depression on Cymbalta 8. Chronic kidney disease stage III. 9. Drop in hemoglobin likely secondary to IV fluids. 10. Hypomagnesemia replace magnesium       Code Status:     Code Status Orders  (From admission, onward)         Start     Ordered   07/05/20 0821  Do not attempt resuscitation (DNR)  Continuous       Question Answer Comment  In the event of cardiac or respiratory ARREST Do not call a "code blue"   In the event of  cardiac or respiratory ARREST Do not perform Intubation, CPR, defibrillation or ACLS   In the event of cardiac or respiratory ARREST Use medication by any route, position, wound care, and other measures to relive pain and  suffering. May use oxygen, suction and manual treatment of airway obstruction as needed for comfort.   Comments Code status was discussed with patient and she wants to be a DNR      07/05/20 0824        Code Status History    This patient has a current code status but no historical code status.   Advance Care Planning Activity     Family Communication: Spoke with patient's daughter on the phone Disposition Plan: Status is: Inpatient  Dispo: The patient is from: Home              Anticipated d/c is to: Home with home health              Patient currently being treated for sepsis with IV antibiotics   Difficult to place patient.  No.  Consultants:  Infectious disease  Antibiotics:  Penicillin G  Time spent: 28 minutes  [place]

## 2020-07-06 NOTE — Evaluation (Signed)
Physical Therapy Evaluation Patient Details Name: [name] MRN: [id number] DOB: [birth year] Today's Date: 07/06/2020   History of Present Illness  [name] is an 80yoF who comes [facility] on 5/12 from [facility] after continued weakness, fatigue, dizziness, HA after failed ABX for UTI as home. PMH: dCHF, GERD, COPD, HTN, hypoTSH, AF on eliquis, lymphadema.  Clinical Impression  Pt admitted with above diagnosis. Pt currently with functional limitations due to the deficits listed below (see "PT Problem List"). Patient agreeable to PT evaluation. Patient provides detailed description of PLOF and home environment. Patient's assessment this date reveals the patient requires an additional person present for safety and/or physical assistance to complete mobility required for their typical ADL. Pt endorses significant strength impairment compared to baseline. At baseline, the patient is able to perform ADL with modified independence. Patient will benefit from skilled PT intervention to maximize independence and safety in mobility required for basic ADL performance at discharge.       Follow Up Recommendations Home health PT;Supervision for mobility/OOB    Equipment Recommendations  None recommended by PT    Recommendations for Other Services       Precautions / Restrictions Precautions Precautions: Fall Restrictions Weight Bearing Restrictions: No      Mobility  Bed Mobility               General bed mobility comments: in recliner at entry    Transfers Overall transfer level: Needs assistance Equipment used: Rolling walker (2 wheeled) Transfers: Sit to/from [facility] Sit to Stand: Min guard;Min assist Stand pivot transfers: Min guard;Min assist       General transfer comment: performs 3 without assist, 1 with assist, requires max effort and 8 attempts total.  Ambulation/Gait Ambulation/Gait assistance: Supervision;Min guard Gait  Distance (Feet): 100 Feet Assistive device: Rolling walker (2 wheeled) Gait Pattern/deviations: Step-to pattern     General Gait Details: generally safe with RW, towel used due to urine leakage, fatigue in legs toward end, but feels good overall.  Stairs            Wheelchair Mobility    Modified Rankin (Stroke Patients Only)       Balance Overall balance assessment: Modified Independent;History of Falls;Mild deficits observed, not formally tested                                           Pertinent Vitals/Pain Pain Assessment: No/denies pain    Home Living Family/patient expects to be discharged to:: Private residence Living Arrangements: Alone Available Help at Discharge: Family Type of Home: Apartment Home Access: Level entry     Home Layout: One level Home Equipment: Environmental consultant - 4 wheels      Prior Function Level of Independence: Needs assistance   Gait / Transfers Assistance Needed: mostly household distances at baseline with 4WW           Hand Dominance        Extremity/Trunk Assessment   Upper Extremity Assessment Upper Extremity Assessment: Generalized weakness;Overall [facility] for tasks assessed    Lower Extremity Assessment Lower Extremity Assessment: Generalized weakness;Overall WFL for tasks assessed       Communication      Cognition Arousal/Alertness: Awake/alert Behavior During Therapy: WFL for tasks assessed/performed Overall Cognitive Status: Within Functional Limits for tasks assessed  General Comments      Exercises     Assessment/Plan    PT Assessment Patient needs continued PT services  PT Problem List Decreased strength;Decreased activity tolerance;Decreased balance;Decreased mobility;Decreased range of motion       PT Treatment Interventions DME instruction;Balance training;Gait training;Functional mobility training;Therapeutic  activities;Therapeutic exercise;Patient/family education    PT Goals (Current goals can be found in the Care Plan section)  Acute Rehab PT Goals Patient Stated Goal: continue to regain her strength PT Goal Formulation: With patient Time For Goal Achievement: 07/20/20 Potential to Achieve Goals: Good    Frequency Min 2X/week   Barriers to discharge        Co-evaluation               AM-PAC PT "6 Clicks" Mobility  Outcome Measure Help needed turning from your back to your side while in a flat bed without using bedrails?: A Lot Help needed moving from lying on your back to sitting on the side of a flat bed without using bedrails?: A Lot Help needed moving to and from a bed to a chair (including a wheelchair)?: A Little Help needed standing up from a chair using your arms (e.g., wheelchair or bedside chair)?: A Little Help needed to walk in hospital room?: A Little Help needed climbing 3-5 steps with a railing? : A Lot 6 Click Score: 15    End of Session Equipment Utilized During Treatment: Gait belt Activity Tolerance: Patient tolerated treatment well;No increased pain;Patient limited by fatigue Patient left: in chair;with call bell/phone within reach;with chair alarm set Nurse Communication: Mobility status PT Visit Diagnosis: Difficulty in walking, not elsewhere classified (R26.2);Other abnormalities of gait and mobility (R26.89);Muscle weakness (generalized) (M62.81)    Time: 1540-1606 PT Time Calculation (min) (ACUTE ONLY): 26 min   Charges:   PT Evaluation $PT Eval Moderate Complexity: 1 Mod PT Treatments $Therapeutic Exercise: 8-22 mins        4:33 PM, 07/06/20 Etta Grandchild, PT, DPT Physical Therapist - [facility]  [phone] ([place])    [place] 07/06/2020, 4:32 PM

## 2020-07-06 NOTE — Consult Note (Signed)
Infectious Disease     Reason for Consult: BActeremia    Referring Physician: Dr Earleen Newport Date of Admission:  07/05/2020   Principal Problem:   Sepsis secondary to UTI [facility]) Active Problems:   Lymphedema   CHF (congestive heart failure) (HCC)   COPD (chronic obstructive pulmonary disease) (HCC)   Hypothyroidism   Hypertension   CKD (chronic kidney disease) stage 3, GFR 30-59 ml/min (HCC)   HPI: [name] is a 87 y.o. female admitted on May 12 with complaints of fevers and chills.  She has multiple medical problems including chronic diastolic dysfunction CHF, COPD, hypertension, chronic A. fib and lymphedema.  She was recently treated for UTI and had finished antibiotics therapy a few days prior.  She then developed nausea chills and feverish feeling.  She was also having dysuria but that had improved.  She had noted continued lower extremity edema.  On exam she was tachycardic white count was 28,000 and she had an elevated lactate.  She received IV fluids and was started on treatment with cefepime and metronidazole.  Her white count has come down to 17,000.  Urine cultures positive for E. coli and blood cultures growing group B strep.  Chest x-ray was normal Of note her most recent urine culture from April 15 had grown E. coli sensitive to cefazolin but resistant to ampicillin.  Otherwise it was pansensitive.Marland Kitchen  She was treated with a 7-day course of cefuroxime. Since admission reports feeling better.  She tells me she has a decades long hx of bil lymphedema. Has seen vascular, had unnawraps and lymphedema pumps but tells me nothing worked. Has compression stockings but cannot get them on.   Past Medical History:  Diagnosis Date  . Arthritis   . Asthma   . CHF (congestive heart failure) ([place])   . COPD (chronic obstructive pulmonary disease) ([place])   . Cough   . GERD (gastroesophageal reflux disease)   . Gout   . Hiatal hernia   . Hypertension   . Hypothyroidism   . Neuropathic pain  of right lower extremity   . Peripheral vascular disease ([place])    "poor circulation"  . Seasonal allergies   . Shortness of breath dyspnea   . Sleep apnea    uses CPAP (sometimes)   Past Surgical History:  Procedure Laterality Date  . ANKLE ARTHROSCOPY    . BACK SURGERY     L4-L5 Decompression  . CARDIAC CATHETERIZATION  1995  . CATARACT EXTRACTION W/PHACO Right 07/19/2014   Procedure: CATARACT EXTRACTION PHACO AND INTRAOCULAR LENS PLACEMENT (IOC);  Surgeon: Leandrew Koyanagi, MD;  Location: [facility];  Service: Ophthalmology;  Laterality: Right;  . CATARACT EXTRACTION W/PHACO Left 08/23/2014   Procedure: CATARACT EXTRACTION PHACO AND INTRAOCULAR LENS PLACEMENT (IOC);  Surgeon: Leandrew Koyanagi, MD;  Location: [facility];  Service: Ophthalmology;  Laterality: Left;  . CHOLECYSTECTOMY    . COLONOSCOPY    . PARATHYROIDECTOMY  2003  . TONSILLECTOMY    . UVULOPALATOPHARYNGOPLASTY     Social History   Tobacco Use  . Smoking status: Passive Smoke Exposure - Never Smoker  . Smokeless tobacco: Never Used  Substance Use Topics  . Alcohol use: No  . Drug use: No   Family History  Problem Relation Age of Onset  . Cancer Mother   . Hyperlipidemia Son   . Diabetes Son   . Cancer Maternal Grandmother     Allergies:  Allergies  Allergen Reactions  . Aspirin Nausea Only  . Prednisone  Other reaction(s): Other (See Comments), Unknown  . Sulfa Antibiotics Rash  . Sulfacetamide Sodium Rash    Current antibiotics: Antibiotics Given (last 72 hours)    Date/Time Action Medication Dose Rate   07/05/20 0534 New Bag/Given   ceFEPIme (MAXIPIME) 2 g in sodium chloride 0.9 % 100 mL IVPB 2 g 200 mL/hr   07/05/20 0607 New Bag/Given   metroNIDAZOLE (FLAGYL) IVPB 500 mg 500 mg 100 mL/hr   07/05/20 0716 New Bag/Given   vancomycin (VANCOREADY) IVPB 2000 mg/400 mL 2,000 mg 200 mL/hr   07/05/20 0936 New Bag/Given   vancomycin (VANCOREADY) IVPB 500 mg/100 mL 500 mg  100 mL/hr   07/05/20 1802 New Bag/Given   penicillin G potassium 4 Million Units in dextrose 5 % 250 mL IVPB 4 Million Units 250 mL/hr   07/05/20 2113 New Bag/Given   penicillin G potassium 4 Million Units in dextrose 5 % 250 mL IVPB 4 Million Units 250 mL/hr   07/06/20 0023 New Bag/Given   penicillin G potassium 4 Million Units in dextrose 5 % 250 mL IVPB 4 Million Units 250 mL/hr   07/06/20 0436 New Bag/Given   penicillin G potassium 4 Million Units in dextrose 5 % 250 mL IVPB 4 Million Units 250 mL/hr   07/06/20 0843 New Bag/Given   penicillin G potassium 4 Million Units in dextrose 5 % 250 mL IVPB 4 Million Units 250 mL/hr   07/06/20 1252 New Bag/Given   penicillin G potassium 4 Million Units in dextrose 5 % 250 mL IVPB 4 Million Units 250 mL/hr      MEDICATIONS: . apixaban  5 mg Oral BID  . Chlorhexidine Gluconate Cloth  6 each Topical Daily  . cholecalciferol  1,000 Units Oral Daily  . DULoxetine  60 mg Oral Daily  . ipratropium-albuterol  3 mL Nebulization TID  . levothyroxine  200 mcg Oral Q0600  . montelukast  10 mg Oral QHS  . multivitamin with minerals  1 tablet Oral Daily  . pneumococcal 23 valent vaccine  0.5 mL Intramuscular Tomorrow-1000  . vitamin B-12  500 mcg Oral Daily    Review of Systems - 11 systems reviewed and negative per HPI   OBJECTIVE: Temp:  [97.9 F (36.6 C)-98.9 F (37.2 C)] 97.9 F (36.6 C) (05/13 1122) Pulse Rate:  [54-102] 72 (05/13 1122) Resp:  [18-24] 19 (05/13 1122) BP: (100-130)/(42-71) 118/67 (05/13 1122) SpO2:  [93 %-100 %] 97 % (05/13 1122) Weight:  [132.9 kg] 132.9 kg (05/13 0450) Physical Exam  Constitutional:  oriented to person, place, and time. appears well-developed and well-nourished. No distress.  HENT: [place]/AT, PERRLA, no scleral icterus Mouth/Throat: Oropharynx is clear and moist. No oropharyngeal exudate.  Cardiovascular: Normal rate, regular rhythm and normal heart sounds. Exam reveals no gallop and no friction rub.   Pulmonary/Chest: Effort normal and breath sounds normal. No respiratory distress.  has no wheezes.  Neck = supple, no nuchal rigidity Abdominal: Soft. Bowel sounds are normal.  exhibits no distension. There is no tenderness.  Lymphadenopathy: no cervical adenopathy. No axillary adenopathy Ext 3+ bil le edema to upper shin Neurological: alert and oriented to person, place, and time.  Skin: multiple skin scabs on R shoulder and L leg. Has area of erythema over L ant shin, no purulence no drainage Psychiatric: a normal mood and affect.  behavior is normal.    LABS: Results for orders placed or performed during the hospital encounter of 07/05/20 (from the past 48 hour(s))  Blood culture (routine x  2)     Status: Abnormal (Preliminary result)   Collection Time: 07/05/20  4:10 AM   Specimen: BLOOD  Result Value Ref Range   Specimen Description      BLOOD RIGHT ANTECUBITAL Performed at [facility], [address] [place], [place] [address]    Special Requests      BOTTLES DRAWN AEROBIC AND ANAEROBIC Blood Culture adequate volume Performed at [facility], [address] [place], [place] [address]    Culture  Setup Time      Organism ID to follow IN BOTH AEROBIC AND ANAEROBIC BOTTLES GRAM POSITIVE COCCI CRITICAL RESULT CALLED TO, READ BACK BY AND VERIFIED WITH: WALLD NAZARI _0  ON 07/05/20.SH Performed at [facility], [address] [place], [place] [address]    Culture (A)     STREPTOCOCCUS AGALACTIAE SUSCEPTIBILITIES TO FOLLOW Performed at [facility], [place] [address], [place], [place] [address]    Report Status PENDING   Blood Culture ID Panel (Reflexed)     Status: Abnormal   Collection Time: 07/05/20  4:10 AM  Result Value Ref Range   Enterococcus faecalis NOT DETECTED NOT DETECTED   Enterococcus Faecium NOT DETECTED NOT DETECTED   Listeria monocytogenes NOT DETECTED NOT DETECTED   Staphylococcus species NOT DETECTED NOT DETECTED    Staphylococcus aureus (BCID) NOT DETECTED NOT DETECTED   Staphylococcus epidermidis NOT DETECTED NOT DETECTED   Staphylococcus lugdunensis NOT DETECTED NOT DETECTED   Streptococcus species DETECTED (A) NOT DETECTED    Comment: CRITICAL RESULT CALLED TO, READ BACK BY AND VERIFIED WITH: WALLD NAZARI _1  ON 07/05/20.SH    Streptococcus agalactiae DETECTED (A) NOT DETECTED    Comment: CRITICAL RESULT CALLED TO, READ BACK BY AND VERIFIED WITH: WALLD NAZARI _2  ON 07/05/20.SH    Streptococcus pneumoniae NOT DETECTED NOT DETECTED   Streptococcus pyogenes NOT DETECTED NOT DETECTED   A.calcoaceticus-baumannii NOT DETECTED NOT DETECTED   Bacteroides fragilis NOT DETECTED NOT DETECTED   Enterobacterales NOT DETECTED NOT DETECTED   Enterobacter cloacae complex NOT DETECTED NOT DETECTED   Escherichia coli NOT DETECTED NOT DETECTED   Klebsiella aerogenes NOT DETECTED NOT DETECTED   Klebsiella oxytoca NOT DETECTED NOT DETECTED   Klebsiella pneumoniae NOT DETECTED NOT DETECTED   Proteus species NOT DETECTED NOT DETECTED   Salmonella species NOT DETECTED NOT DETECTED   Serratia marcescens NOT DETECTED NOT DETECTED   Haemophilus influenzae NOT DETECTED NOT DETECTED   Neisseria meningitidis NOT DETECTED NOT DETECTED   Pseudomonas aeruginosa NOT DETECTED NOT DETECTED   Stenotrophomonas maltophilia NOT DETECTED NOT DETECTED   Candida albicans NOT DETECTED NOT DETECTED   Candida auris NOT DETECTED NOT DETECTED   Candida glabrata NOT DETECTED NOT DETECTED   Candida krusei NOT DETECTED NOT DETECTED   Candida parapsilosis NOT DETECTED NOT DETECTED   Candida tropicalis NOT DETECTED NOT DETECTED   Cryptococcus neoformans/gattii NOT DETECTED NOT DETECTED    Comment: Performed at [facility], [address] [place], [place] [address]  CBC with Differential     Status: Abnormal   Collection Time: 07/05/20  4:16 AM  Result Value Ref Range   WBC 28.0 (H) 4.0 - 10.5 K/uL   RBC 4.00 3.87 -  5.11 MIL/uL   Hemoglobin 12.0 12.0 - 15.0 g/dL   HCT 35.3 (L) 36.0 - 46.0 %   MCV 88.3 80.0 - 100.0 fL   MCH 30.0 26.0 - 34.0 pg   MCHC 34.0 30.0 - 36.0 g/dL   RDW 13.5 11.5 -  15.5 %   Platelets 233 150 - 400 K/uL   nRBC 0.0 0.0 - 0.2 %   Neutrophils Relative % 90 %   Neutro Abs 25.3 (H) 1.7 - 7.7 K/uL   Lymphocytes Relative 3 %   Lymphs Abs 0.7 0.7 - 4.0 K/uL   Monocytes Relative 5 %   Monocytes Absolute 1.4 (H) 0.1 - 1.0 K/uL   Eosinophils Relative 0 %   Eosinophils Absolute 0.1 0.0 - 0.5 K/uL   Basophils Relative 0 %   Basophils Absolute 0.1 0.0 - 0.1 K/uL   WBC Morphology MORPHOLOGY UNREMARKABLE    RBC Morphology MORPHOLOGY UNREMARKABLE    Smear Review Normal platelet morphology    Immature Granulocytes 2 %   Abs Immature Granulocytes 0.43 (H) 0.00 - 0.07 K/uL    Comment: Performed at [facility], [address] [place], [place] [address]  Comprehensive metabolic panel     Status: Abnormal   Collection Time: 07/05/20  4:16 AM  Result Value Ref Range   Sodium 134 (L) 135 - 145 mmol/L   Potassium 3.9 3.5 - 5.1 mmol/L   Chloride 101 98 - 111 mmol/L   CO2 25 22 - 32 mmol/L   Glucose, Bld 102 (H) 70 - 99 mg/dL    Comment: Glucose reference range applies only to samples taken after fasting for at least 8 hours.   BUN 33 (H) 8 - 23 mg/dL   Creatinine, Ser 1.14 (H) 0.44 - 1.00 mg/dL   Calcium 10.6 (H) 8.9 - 10.3 mg/dL   Total Protein 7.0 6.5 - 8.1 g/dL   Albumin 3.8 3.5 - 5.0 g/dL   AST 27 15 - 41 U/L   ALT 18 0 - 44 U/L   Alkaline Phosphatase 66 38 - 126 U/L   Total Bilirubin 1.1 0.3 - 1.2 mg/dL   GFR, Estimated 47 (L) >60 mL/min    Comment: (NOTE) Calculated using the CKD-EPI Creatinine Equation (2021)    Anion gap 8 5 - 15    Comment: Performed at [facility], [place], [place] [address]  Procalcitonin - Baseline     Status: None   Collection Time: 07/05/20  4:16 AM  Result Value Ref Range   Procalcitonin 0.63 ng/mL    Comment:         Interpretation: PCT > 0.5 ng/mL and <= 2 ng/mL: Systemic infection (sepsis) is possible, but other conditions are known to elevate PCT as well. (NOTE)       Sepsis PCT Algorithm           Lower Respiratory Tract                                      Infection PCT Algorithm    ----------------------------     ----------------------------         PCT < 0.25 ng/mL                PCT < 0.10 ng/mL          Strongly encourage             Strongly discourage   discontinuation of antibiotics    initiation of antibiotics    ----------------------------     -----------------------------       PCT 0.25 - 0.50 ng/mL            PCT 0.10 - 0.25 ng/mL  OR       >80% decrease in PCT            Discourage initiation of                                            antibiotics      Encourage discontinuation           of antibiotics    ----------------------------     -----------------------------         PCT >= 0.50 ng/mL              PCT 0.26 - 0.50 ng/mL                AND       <80% decrease in PCT             Encourage initiation of                                             antibiotics       Encourage continuation           of antibiotics    ----------------------------     -----------------------------        PCT >= 0.50 ng/mL                  PCT > 0.50 ng/mL               AND         increase in PCT                  Strongly encourage                                      initiation of antibiotics    Strongly encourage escalation           of antibiotics                                     -----------------------------                                           PCT <= 0.25 ng/mL                                                 OR                                        > 80% decrease in PCT                                      Discontinue / Do not initiate  antibiotics  Performed at [facility], [place], [place] [address]   Lactic acid, plasma     Status: Abnormal   Collection Time: 07/05/20  4:16 AM  Result Value Ref Range   Lactic Acid, Venous 2.3 (HH) 0.5 - 1.9 mmol/L    Comment: CRITICAL RESULT CALLED TO, READ BACK BY AND VERIFIED WITH  [place] [id number] 07/05/20 SDR Performed at [facility], [address] [place], [place] [address]   Magnesium     Status: Abnormal   Collection Time: 07/05/20  4:16 AM  Result Value Ref Range   Magnesium 1.5 (L) 1.7 - 2.4 mg/dL    Comment: Performed at [facility], [address] [place], [place] [address]  Blood culture (routine x 2)     Status: None (Preliminary result)   Collection Time: 07/05/20  4:16 AM   Specimen: BLOOD  Result Value Ref Range   Specimen Description      BLOOD LEFT ANTECUBITAL Performed at [facility], [address], [place], [place] [address]    Special Requests      BOTTLES DRAWN AEROBIC AND ANAEROBIC Blood Culture adequate volume Performed at [facility], [address] [place], [place] [address]    Culture  Setup Time      GRAM POSITIVE COCCI IN BOTH AEROBIC AND ANAEROBIC BOTTLES CRITICAL VALUE NOTED.  VALUE IS CONSISTENT WITH PREVIOUSLY REPORTED AND CALLED VALUE. Performed at [facility], [place] [address] [place], [place] [address]    Culture GRAM POSITIVE COCCI    Report Status PENDING   Resp Panel by RT-PCR (Flu A&B, Covid) Nasopharyngeal Swab     Status: None   Collection Time: 07/05/20  4:18 AM   Specimen: Nasopharyngeal Swab; Nasopharyngeal(NP) swabs in vial transport medium  Result Value Ref Range   SARS Coronavirus 2 by RT PCR NEGATIVE NEGATIVE    Comment: (NOTE) SARS-CoV-2 target nucleic acids are NOT DETECTED.  The SARS-CoV-2 RNA is generally detectable in upper respiratory specimens during the acute phase of infection. The lowest concentration of SARS-CoV-2 viral copies this assay can detect is 138 copies/mL. A negative result does not  preclude SARS-Cov-2 infection and should not be used as the sole basis for treatment or other patient management decisions. A negative result may occur with  improper specimen collection/handling, submission of specimen other than nasopharyngeal swab, presence of viral mutation(s) within the areas targeted by this assay, and inadequate number of viral copies(<138 copies/mL). A negative result must be combined with clinical observations, patient history, and epidemiological information. The expected result is Negative.  Fact Sheet for Patients:  EntrepreneurPulse.com.au  Fact Sheet for Healthcare Providers:  IncredibleEmployment.be  This test is no t yet approved or cleared by the [place] FDA and  has been authorized for detection and/or diagnosis of SARS-CoV-2 by FDA under an Emergency Use Authorization (EUA). This EUA will remain  in effect (meaning this test can be used) for the duration of the COVID-19 declaration under Section 564(b)(1) of the Act, 21 U.S.C.section 360bbb-3(b)(1), unless the authorization is terminated  or revoked sooner.       Influenza A by PCR NEGATIVE NEGATIVE   Influenza B by PCR NEGATIVE NEGATIVE    Comment: (NOTE) The Xpert Xpress SARS-CoV-2/FLU/RSV plus assay is intended as an aid in the diagnosis of influenza from Nasopharyngeal swab specimens and should not be used as a sole basis for treatment. Nasal washings and aspirates are unacceptable for Xpert Xpress  SARS-CoV-2/FLU/RSV testing.  Fact Sheet for Patients: EntrepreneurPulse.com.au  Fact Sheet for Healthcare Providers: IncredibleEmployment.be  This test is not yet approved or cleared by the [place] FDA and has been authorized for detection and/or diagnosis of SARS-CoV-2 by FDA under an Emergency Use Authorization (EUA). This EUA will remain in effect (meaning this test can be used) for the duration of  the COVID-19 declaration under Section 564(b)(1) of the Act, 21 U.S.C. section 360bbb-3(b)(1), unless the authorization is terminated or revoked.  Performed at [facility], [address] [place], [place] [address]   Urinalysis, Complete w Microscopic     Status: Abnormal   Collection Time: 07/05/20  5:24 AM  Result Value Ref Range   Color, Urine YELLOW (A) YELLOW   APPearance HAZY (A) CLEAR   Specific Gravity, Urine 1.014 1.005 - 1.030   pH 5.0 5.0 - 8.0   Glucose, UA NEGATIVE NEGATIVE mg/dL   Hgb urine dipstick SMALL (A) NEGATIVE   Bilirubin Urine NEGATIVE NEGATIVE   Ketones, ur NEGATIVE NEGATIVE mg/dL   Protein, ur NEGATIVE NEGATIVE mg/dL   Nitrite NEGATIVE NEGATIVE   Leukocytes,Ua MODERATE (A) NEGATIVE   WBC, UA 11-20 0 - 5 WBC/hpf   Bacteria, UA MANY (A) NONE SEEN   Squamous Epithelial / LPF NONE SEEN 0 - 5   WBC Clumps PRESENT    Mucus PRESENT    Hyaline Casts, UA PRESENT     Comment: Performed at [facility], [address], [place], [place] [address]  Urine culture     Status: Abnormal (Preliminary result)   Collection Time: 07/05/20  5:24 AM   Specimen: Urine, Random  Result Value Ref Range   Specimen Description      URINE, RANDOM Performed at [facility], [address] [place], [place] [address]    Special Requests      NONE Performed at [facility], [address] [place], [place] [address]    Culture >=100,000 COLONIES/mL ESCHERICHIA COLI (A)    Report Status PENDING   Lactic acid, plasma     Status: Abnormal   Collection Time: 07/05/20 11:01 AM  Result Value Ref Range   Lactic Acid, Venous 2.6 (HH) 0.5 - 1.9 mmol/L    Comment: CRITICAL RESULT CALLED TO, READ BACK BY AND VERIFIED WITH JACKIE FERGUSON AT 1200 ON 07/05/2020 [place]. Performed at [facility], [address] [place], [place] [address]   Lactic acid, plasma     Status: None   Collection Time: 07/05/20  3:53 PM  Result Value Ref Range    Lactic Acid, Venous 1.8 0.5 - 1.9 mmol/L    Comment: Performed at [facility], [address] [place], [place] [address]  Lactic acid, plasma     Status: None   Collection Time: 07/05/20  7:01 PM  Result Value Ref Range   Lactic Acid, Venous 1.4 0.5 - 1.9 mmol/L    Comment: Performed at [facility], [address] [place], [place] [address]  Protime-INR     Status: Abnormal   Collection Time: 07/06/20  4:20 AM  Result Value Ref Range   Prothrombin Time 21.2 (H) 11.4 - 15.2 seconds   INR 1.8 (H) 0.8 - 1.2    Comment: (NOTE) INR goal varies based on device and disease states. Performed at [facility], [address] [place],  [address]   Cortisol-am, blood     Status: None   Collection Time: 07/06/20  4:20 AM  Result Value Ref Range   Cortisol - AM 9.7 6.7 - 22.6 ug/dL    Comment: Performed at [facility], [place] [address], [place], [place] [address]  Procalcitonin     Status: None   Collection Time: 07/06/20  4:20 AM  Result Value Ref Range   Procalcitonin 2.32 ng/mL    Comment:        Interpretation: PCT > 2 ng/mL: Systemic infection (sepsis) is likely, unless other causes are known. (NOTE)       Sepsis PCT Algorithm           Lower Respiratory Tract                                      Infection PCT Algorithm    ----------------------------     ----------------------------         PCT < 0.25 ng/mL                PCT < 0.10 ng/mL          Strongly encourage             Strongly discourage   discontinuation of antibiotics    initiation of antibiotics    ----------------------------     -----------------------------       PCT 0.25 - 0.50 ng/mL            PCT 0.10 - 0.25 ng/mL               OR       >80% decrease in PCT            Discourage initiation of                                            antibiotics      Encourage discontinuation           of antibiotics    ----------------------------      -----------------------------         PCT >= 0.50 ng/mL              PCT 0.26 - 0.50 ng/mL               AND       <80% decrease in PCT              Encourage initiation of                                             antibiotics       Encourage continuation           of antibiotics    ----------------------------     -----------------------------        PCT >= 0.50 ng/mL                  PCT > 0.50 ng/mL               AND         increase in PCT                  Strongly encourage  initiation of antibiotics    Strongly encourage escalation           of antibiotics                                     -----------------------------                                           PCT <= 0.25 ng/mL                                                 OR                                        > 80% decrease in PCT                                      Discontinue / Do not initiate                                             antibiotics  Performed at [facility], [address] [place], [place] [address]   Basic metabolic panel     Status: Abnormal   Collection Time: 07/06/20  4:20 AM  Result Value Ref Range   Sodium 132 (L) 135 - 145 mmol/L   Potassium 3.8 3.5 - 5.1 mmol/L   Chloride 103 98 - 111 mmol/L   CO2 22 22 - 32 mmol/L   Glucose, Bld 119 (H) 70 - 99 mg/dL    Comment: Glucose reference range applies only to samples taken after fasting for at least 8 hours.   BUN 33 (H) 8 - 23 mg/dL   Creatinine, Ser 1.15 (H) 0.44 - 1.00 mg/dL   Calcium 9.9 8.9 - 10.3 mg/dL   GFR, Estimated 46 (L) >60 mL/min    Comment: (NOTE) Calculated using the CKD-EPI Creatinine Equation (2021)    Anion gap 7 5 - 15    Comment: Performed at [facility], [address] [place], [place] [address]  CBC     Status: Abnormal   Collection Time: 07/06/20  4:20 AM  Result Value Ref Range   WBC 17.0 (H) 4.0 - 10.5 K/uL   RBC 3.33 (L) 3.87 - 5.11 MIL/uL    Hemoglobin 9.8 (L) 12.0 - 15.0 g/dL   HCT 29.5 (L) 36.0 - 46.0 %   MCV 88.6 80.0 - 100.0 fL   MCH 29.4 26.0 - 34.0 pg   MCHC 33.2 30.0 - 36.0 g/dL   RDW 13.9 11.5 - 15.5 %   Platelets 173 150 - 400 K/uL   nRBC 0.0 0.0 - 0.2 %    Comment: Performed at [facility], [address] [place], [place] [address]   No components found for: ESR, C REACTIVE PROTEIN MICRO: Recent Results (from the past 720 hour(s))  Blood culture (routine x  2)     Status: Abnormal (Preliminary result)   Collection Time: 07/05/20  4:10 AM   Specimen: BLOOD  Result Value Ref Range Status   Specimen Description   Final    BLOOD RIGHT ANTECUBITAL Performed at [facility], [address] [place], [place] [address]    Special Requests   Final    BOTTLES DRAWN AEROBIC AND ANAEROBIC Blood Culture adequate volume Performed at [facility], [address] [place], [place] [address]    Culture  Setup Time   Final    Organism ID to follow IN BOTH AEROBIC AND ANAEROBIC BOTTLES GRAM POSITIVE COCCI CRITICAL RESULT CALLED TO, READ BACK BY AND VERIFIED WITH: WALLD NAZARI _0  ON 07/05/20.SH Performed at [facility], [address], [place], [place] [address]    Culture (A)  Final    STREPTOCOCCUS AGALACTIAE SUSCEPTIBILITIES TO FOLLOW Performed at [facility], [place] [address], [place], [place] [address]    Report Status PENDING  Incomplete  Blood Culture ID Panel (Reflexed)     Status: Abnormal   Collection Time: 07/05/20  4:10 AM  Result Value Ref Range Status   Enterococcus faecalis NOT DETECTED NOT DETECTED Final   Enterococcus Faecium NOT DETECTED NOT DETECTED Final   Listeria monocytogenes NOT DETECTED NOT DETECTED Final   Staphylococcus species NOT DETECTED NOT DETECTED Final   Staphylococcus aureus (BCID) NOT DETECTED NOT DETECTED Final   Staphylococcus epidermidis NOT DETECTED NOT DETECTED Final   Staphylococcus lugdunensis NOT DETECTED NOT DETECTED Final    Streptococcus species DETECTED (A) NOT DETECTED Final    Comment: CRITICAL RESULT CALLED TO, READ BACK BY AND VERIFIED WITH: WALLD NAZARI _1  ON 07/05/20.SH    Streptococcus agalactiae DETECTED (A) NOT DETECTED Final    Comment: CRITICAL RESULT CALLED TO, READ BACK BY AND VERIFIED WITH: WALLD NAZARI _2  ON 07/05/20.SH    Streptococcus pneumoniae NOT DETECTED NOT DETECTED Final   Streptococcus pyogenes NOT DETECTED NOT DETECTED Final   A.calcoaceticus-baumannii NOT DETECTED NOT DETECTED Final   Bacteroides fragilis NOT DETECTED NOT DETECTED Final   Enterobacterales NOT DETECTED NOT DETECTED Final   Enterobacter cloacae complex NOT DETECTED NOT DETECTED Final   Escherichia coli NOT DETECTED NOT DETECTED Final   Klebsiella aerogenes NOT DETECTED NOT DETECTED Final   Klebsiella oxytoca NOT DETECTED NOT DETECTED Final   Klebsiella pneumoniae NOT DETECTED NOT DETECTED Final   Proteus species NOT DETECTED NOT DETECTED Final   Salmonella species NOT DETECTED NOT DETECTED Final   Serratia marcescens NOT DETECTED NOT DETECTED Final   Haemophilus influenzae NOT DETECTED NOT DETECTED Final   Neisseria meningitidis NOT DETECTED NOT DETECTED Final   Pseudomonas aeruginosa NOT DETECTED NOT DETECTED Final   Stenotrophomonas maltophilia NOT DETECTED NOT DETECTED Final   Candida albicans NOT DETECTED NOT DETECTED Final   Candida auris NOT DETECTED NOT DETECTED Final   Candida glabrata NOT DETECTED NOT DETECTED Final   Candida krusei NOT DETECTED NOT DETECTED Final   Candida parapsilosis NOT DETECTED NOT DETECTED Final   Candida tropicalis NOT DETECTED NOT DETECTED Final   Cryptococcus neoformans/gattii NOT DETECTED NOT DETECTED Final    Comment: Performed at [facility], [address] [place], [place] [address]  Blood culture (routine x 2)     Status: None (Preliminary result)   Collection Time: 07/05/20  4:16 AM   Specimen: BLOOD  Result Value Ref Range Status   Specimen  Description   Final    BLOOD LEFT ANTECUBITAL Performed  at [facility], [address], [place], [place] [address]    Special Requests   Final    BOTTLES DRAWN AEROBIC AND ANAEROBIC Blood Culture adequate volume Performed at [facility], [address] [place], [place] [address]    Culture  Setup Time   Final    GRAM POSITIVE COCCI IN BOTH AEROBIC AND ANAEROBIC BOTTLES CRITICAL VALUE NOTED.  VALUE IS CONSISTENT WITH PREVIOUSLY REPORTED AND CALLED VALUE. Performed at [facility], [place] [address], [place], [place] [address]    Culture GRAM POSITIVE COCCI  Final   Report Status PENDING  Incomplete  Resp Panel by RT-PCR (Flu A&B, Covid) Nasopharyngeal Swab     Status: None   Collection Time: 07/05/20  4:18 AM   Specimen: Nasopharyngeal Swab; Nasopharyngeal(NP) swabs in vial transport medium  Result Value Ref Range Status   SARS Coronavirus 2 by RT PCR NEGATIVE NEGATIVE Final    Comment: (NOTE) SARS-CoV-2 target nucleic acids are NOT DETECTED.  The SARS-CoV-2 RNA is generally detectable in upper respiratory specimens during the acute phase of infection. The lowest concentration of SARS-CoV-2 viral copies this assay can detect is 138 copies/mL. A negative result does not preclude SARS-Cov-2 infection and should not be used as the sole basis for treatment or other patient management decisions. A negative result may occur with  improper specimen collection/handling, submission of specimen other than nasopharyngeal swab, presence of viral mutation(s) within the areas targeted by this assay, and inadequate number of viral copies(<138 copies/mL). A negative result must be combined with clinical observations, patient history, and epidemiological information. The expected result is Negative.  Fact Sheet for Patients:  EntrepreneurPulse.com.au  Fact Sheet for Healthcare Providers:  IncredibleEmployment.be  This test is no t  yet approved or cleared by the [place] FDA and  has been authorized for detection and/or diagnosis of SARS-CoV-2 by FDA under an Emergency Use Authorization (EUA). This EUA will remain  in effect (meaning this test can be used) for the duration of the COVID-19 declaration under Section 564(b)(1) of the Act, 21 U.S.C.section 360bbb-3(b)(1), unless the authorization is terminated  or revoked sooner.       Influenza A by PCR NEGATIVE NEGATIVE Final   Influenza B by PCR NEGATIVE NEGATIVE Final    Comment: (NOTE) The Xpert Xpress SARS-CoV-2/FLU/RSV plus assay is intended as an aid in the diagnosis of influenza from Nasopharyngeal swab specimens and should not be used as a sole basis for treatment. Nasal washings and aspirates are unacceptable for Xpert Xpress SARS-CoV-2/FLU/RSV testing.  Fact Sheet for Patients: EntrepreneurPulse.com.au  Fact Sheet for Healthcare Providers: IncredibleEmployment.be  This test is not yet approved or cleared by the [place] FDA and has been authorized for detection and/or diagnosis of SARS-CoV-2 by FDA under an Emergency Use Authorization (EUA). This EUA will remain in effect (meaning this test can be used) for the duration of the COVID-19 declaration under Section 564(b)(1) of the Act, 21 U.S.C. section 360bbb-3(b)(1), unless the authorization is terminated or revoked.  Performed at [facility], [address] [place], [place] [address]   Urine culture     Status: Abnormal (Preliminary result)   Collection Time: 07/05/20  5:24 AM   Specimen: Urine, Random  Result Value Ref Range Status   Specimen Description   Final    URINE, RANDOM Performed at [facility], [address], [place],  [address]    Special Requests   Final    NONE Performed at  [facility] [place], [place] [address]    Culture >=100,000 COLONIES/mL ESCHERICHIA COLI (A)  Final    Report Status PENDING  Incomplete    IMAGING: US RENAL  Result Date: 07/06/2020 CLINICAL DATA:  Chronic kidney disease EXAM: RENAL / URINARY TRACT ULTRASOUND COMPLETE COMPARISON:  None. FINDINGS: Right Kidney: Renal measurements: 10.5 x 4.5 x 5.0 cm = volume: 124 mL. Normal echogenicity. Cortical thinning noted. No hydronephrosis or acute finding. No focal abnormality or mass by ultrasound. Left Kidney: Renal measurements: 10.2 x 5.1 x 4.7 cm = volume: 128 mL. Normal echogenicity. More preserved renal cortex. No acute finding or hydronephrosis. No focal abnormality. Bladder: Appears normal for degree of bladder distention. Ureteral jets not demonstrated. Other: No free fluid. IMPRESSION: No acute finding by renal ultrasound. Right kidney cortical thinning noted. Electronically Signed   By: Jerilynn Mages.  Shick M.D.   On: 07/06/2020 11:19   DG Chest Portable 1 View  Result Date: 07/05/2020 CLINICAL DATA:  Cough EXAM: PORTABLE CHEST 1 VIEW COMPARISON:  10/03/2015 FINDINGS: Limited low volume and rotated chest. Stable heart size and mediastinal contours. Interstitial prominence which is similar to before. There is no edema, consolidation, effusion, or pneumothorax. IMPRESSION: Stable compared to 2017.  No acute or focal finding. Electronically Signed   By: Monte Fantasia M.D.   On: 07/05/2020 04:42    Assessment:   [name] is a 87 y.o. female with a history of multiple medical problems including diastolic CHF, COPD, hypertension, A. fib and chronic lymphedema.  She is now admitted with fevers and chills and is found to have group B strep bacteremia as well as an E. coli urinary tract infection.  She was recently treated with cefuroxime for outpatient E. coli UTI.  On admission she was also found to have mild cellulitis on her left lower extremity. She does have a history of chronic lymphedema and had been seen by vascular in the past, had used in the wraps and has a lymphedema pump at home.  She will be  at continued risk for recurrent cellulitis is not bacteremia and will need to work as an outpatient on lymphedema control  Recommendations Given recent E. coli from outpatient culture was resistant to ampicillin will change back to ceftriaxone pending results. Check an echocardiogram both to evaluate her EF and to evaluate the valves given the bacteremia  - ordered At discharge she can likely be discharged home on oral antibiotics.   At the time of discharge I would also suggest bilateral inner wraps. I would suggest home health be arranged to change her Unna wraps twice weekly. I can see her in follow-up after several weeks of the wraps.  Thank you very much for allowing me to participate in the care of this patient. Please call with questions.   Cheral Marker. Ola Spurr, MD

## 2020-07-06 NOTE — TOC Initial Note (Signed)
Transition of Care [facility]) - Initial/Assessment Note    Patient Details  Name: [name] MRN: [id number] Date of Birth: [birth year]  Transition [facility] CM/SW Contact:    Christie Salen, RN Phone Number: 07/06/2020, 1:26 PM  Clinical Narrative: Spoke with patient, who says she lives alone in [name]. Daughter [name] assist with shopping, picking up acute meds at [name], chronic meds are delivered by [name]. Patient says she cooks and ambulates with rolling walker, 4 wheels. No home oxygen however do have a C-Pap that is not in use for over a year. PCP will order sleep study to assess need. [name] daughter assist with housework, no home health services. Son lives nearby and able to assist with transport to medical appointments if needed. PT/OT pending, will continue to track for [name] needs.                  Expected Discharge Plan: Home/Self Care Barriers to Discharge: Continued Medical Work up   Patient Goals and CMS Choice Patient states their goals for this hospitalization and ongoing recovery are:: To return home.   Choice offered to / list presented to : NA  Expected Discharge Plan and Services Expected Discharge Plan: Home/Self Care In-house Referral: Clinical Social Work     Living arrangements for the past 2 months: Apartment                                      Prior Living Arrangements/Services Living arrangements for the past 2 months: Apartment Lives with:: Self Patient language and need for interpreter reviewed:: Yes Do you feel safe going back to the place where you live?: Yes      Need for Family Participation in Patient Care: Yes (Comment) Care giver support system in place?: Yes (comment)   Criminal Activity/Legal Involvement Pertinent to Current Situation/Hospitalization: No - Comment as needed  Activities of Daily Living Home Assistive Devices/Equipment: Walker (specify type) (walker) ADL Screening (condition at time of  admission) Patient's cognitive ability adequate to safely complete daily activities?: Yes Is the patient deaf or have difficulty hearing?: No Does the patient have difficulty seeing, even when wearing glasses/contacts?: No Does the patient have difficulty concentrating, remembering, or making decisions?: No Patient able to express need for assistance with ADLs?: Yes Does the patient have difficulty dressing or bathing?: No Independently performs ADLs?: Yes (appropriate for developmental age) Does the patient have difficulty walking or climbing stairs?: Yes Weakness of Legs: None Weakness of Arms/Hands: None  Permission Sought/Granted                  Emotional Assessment Appearance:: Appears stated age Attitude/Demeanor/Rapport: Engaged Affect (typically observed): Stable Orientation: : Oriented to Self,Oriented to Place,Oriented to  Time,Oriented to Situation Alcohol / Substance Use: Not Applicable Psych Involvement: No (comment)  Admission diagnosis:  Hypomagnesemia [E83.42] Atrial fibrillation with RVR ([place]) [I48.91] Sepsis secondary to UTI ([place]) [A41.9, N39.0] Sepsis, due to unspecified organism, unspecified whether acute organ dysfunction present [facility]) [A41.9] Patient Active Problem List   Diagnosis Date Noted  . Sepsis secondary to UTI ([place]) 07/05/2020  . COPD (chronic obstructive pulmonary disease) ([place])   . Hypothyroidism   . Hypertension   . CKD (chronic kidney disease) stage 3, GFR 30-59 ml/min ([facility])   . CHF (congestive heart failure) ([place]) 03/27/2017  . Lower extremity ulceration, left, limited to breakdown of skin ([place]) 03/27/2017  .  CKD (chronic kidney disease) 02/12/2016  . Swelling of limb 02/12/2016  . Pain in toes of both feet 01/14/2016  . Varicose veins of both lower extremities with pain 01/14/2016  . Lymphedema 01/14/2016   PCP:  Ezequiel Kayser, MD Pharmacy:   [facility] [phone] [address], [place] [address] Phone: [phone] Fax: [phone]     Social Determinants of Health (SDOH) Interventions    Readmission Risk Interventions No flowsheet data found.

## 2020-07-07 ENCOUNTER — Inpatient Hospital Stay (HOSPITAL_COMMUNITY)
Admit: 2020-07-07 | Discharge: 2020-07-07 | Disposition: A | Discharge status: Home / Self Care | Payer: Medicare HMO | Attending: Infectious Diseases | Admitting: Infectious Diseases

## 2020-07-07 DIAGNOSIS — N182 Chronic kidney disease, stage 2 (mild): Secondary | ICD-10-CM

## 2020-07-07 DIAGNOSIS — R7881 Bacteremia: Secondary | ICD-10-CM | POA: Diagnosis not present

## 2020-07-07 LAB — CBC
HCT: 31.2 % — ABNORMAL LOW (ref 36.0–46.0)
Hemoglobin: 10.2 g/dL — ABNORMAL LOW (ref 12.0–15.0)
MCH: 29.2 pg (ref 26.0–34.0)
MCHC: 32.7 g/dL (ref 30.0–36.0)
MCV: 89.4 fL (ref 80.0–100.0)
Platelets: 178 10*3/uL (ref 150–400)
RBC: 3.49 MIL/uL — ABNORMAL LOW (ref 3.87–5.11)
RDW: 13.9 % (ref 11.5–15.5)
WBC: 10.5 10*3/uL (ref 4.0–10.5)
nRBC: 0 % (ref 0.0–0.2)

## 2020-07-07 LAB — CULTURE, BLOOD (ROUTINE X 2)
Special Requests: ADEQUATE
Special Requests: ADEQUATE

## 2020-07-07 LAB — BASIC METABOLIC PANEL
Anion gap: 7 (ref 5–15)
BUN: 28 mg/dL — ABNORMAL HIGH (ref 8–23)
CO2: 24 mmol/L (ref 22–32)
Calcium: 10.2 mg/dL (ref 8.9–10.3)
Chloride: 104 mmol/L (ref 98–111)
Creatinine, Ser: 0.9 mg/dL (ref 0.44–1.00)
GFR, Estimated: >60 mL/min (ref >60)
Glucose, Bld: 122 mg/dL — ABNORMAL HIGH (ref 70–99)
Potassium: 4.1 mmol/L (ref 3.5–5.1)
Sodium: 135 mmol/L (ref 135–145)

## 2020-07-07 LAB — ECHOCARDIOGRAM COMPLETE
AR max vel: 2.48 cm2
AV Peak grad: 4.8 mmHg
Ao pk vel: 1.09 m/s
Area-P 1/2: 4.49 cm2
Height: 64 in
S' Lateral: 2.69 cm
Weight: 4686.4 oz

## 2020-07-07 LAB — MAGNESIUM: Magnesium: 1.9 mg/dL (ref 1.7–2.4)

## 2020-07-07 LAB — URINE CULTURE: Culture: >=100000 — AB

## 2020-07-07 MED ORDER — FUROSEMIDE 20 MG PO TABS
20.0000 mg | ORAL_TABLET | Freq: Two times a day (BID) | ORAL | Status: DC
  Administered 2020-07-07 – 2020-07-08 (×2): 20 mg via ORAL
  Filled 2020-07-07 (×2): qty 1

## 2020-07-07 MED ORDER — CEFAZOLIN SODIUM-DEXTROSE 2-4 GM/100ML-% IV SOLN
2.0000 g | Freq: Three times a day (TID) | INTRAVENOUS | Status: DC
  Administered 2020-07-07 – 2020-07-10 (×9): 2 g via INTRAVENOUS
  Filled 2020-07-07 (×12): qty 100

## 2020-07-07 NOTE — Progress Notes (Signed)
Mobility Specialist - Progress Note   07/07/20 1400  Mobility  Activity Transferred:  Bed to chair  Level of Assistance Standby assist, set-up cues, supervision of patient - no hands on  Assistive Device Front wheel walker  Distance Ambulated (ft) 3 ft  Mobility Out of bed to chair with meals  Mobility Response Tolerated well  Mobility performed by Mobility specialist  $Mobility charge 1 Mobility    Pt transferred from bed-recliner with supervision. MinA to stand. Incontinent. No LOB. Meal tray placed in front of pt, alarm set.    Kathee Delton Mobility Specialist 07/07/20, 2:31 PM

## 2020-07-07 NOTE — Progress Notes (Signed)
Patient ID: [name], female   DOB: March 16, 1930, 87 y.o.   MRN: [id number] [facility] PROGRESS NOTE  [name] [id number] DOB: 12/14/1930 DOA: 07/05/2020 PCP: Ezequiel Kayser, MD  HPI/Subjective: Patient states that she was having hallucinations at home prior to coming in talking to her daughter and husband but nobody was there.  Patient does feel little bit better today.  Complains of cough.  Still feels a little bit weak.  Admitted with strep agalactiae sepsis.  Objective: Vitals:   07/07/20 0808 07/07/20 1151  BP: 113/68 117/71  Pulse: 85 96  Resp: 18 18  Temp: 98 F (36.7 C) 97.6 F (36.4 C)  SpO2: 97% 94%    Intake/Output Summary (Last 24 hours) at 07/07/2020 1430 Last data filed at 07/07/2020 1405 Gross per 24 hour  Intake 2817.14 ml  Output 3940 ml  Net -1122.86 ml   Filed Weights   07/05/20 0414 07/06/20 0450  Weight: 125.7 kg 132.9 kg    ROS: Review of Systems  Respiratory: Negative for shortness of breath.   Cardiovascular: Negative for chest pain.  Gastrointestinal: Negative for abdominal pain, nausea and vomiting.   Exam: Physical Exam HENT:     Head: Normocephalic.     Mouth/Throat:     Pharynx: No oropharyngeal exudate.  Eyes:     General: Lids are normal.     Conjunctiva/sclera: Conjunctivae normal.     Pupils: Pupils are equal, round, and reactive to light.  Cardiovascular:     Rate and Rhythm: Normal rate. Rhythm irregularly irregular.     Heart sounds: Normal heart sounds, S1 normal and S2 normal.  Pulmonary:     Breath sounds: Examination of the right-lower field reveals decreased breath sounds and rhonchi. Examination of the left-lower field reveals decreased breath sounds and rhonchi. Decreased breath sounds and rhonchi present. No wheezing or rales.  Abdominal:     Palpations: Abdomen is soft.     Tenderness: There is no abdominal tenderness.  Musculoskeletal:     Right lower leg: Swelling present.     Left lower leg:  Swelling present.  Skin:    General: Skin is warm.     Comments: Erythema left lower extremity starting to fade a little bit and a little less angry than yesterday. Chronic right lower extremity erythema Secondary excoriations over shoulders and arms.  Neurological:     Mental Status: She is alert and oriented to person, place, and time.       Data Reviewed: Basic Metabolic Panel: Recent Labs  Lab 07/05/20 0416 07/06/20 0420 07/07/20 0455  NA 134* 132* 135  K 3.9 3.8 4.1  CL 101 103 104  CO2 25 22 24   GLUCOSE 102* 119* 122*  BUN 33* 33* 28*  CREATININE 1.14* 1.15* 0.90  CALCIUM 10.6* 9.9 10.2  MG 1.5*  --  1.9   Liver Function Tests: Recent Labs  Lab 07/05/20 0416  AST 27  ALT 18  ALKPHOS 66  BILITOT 1.1  PROT 7.0  ALBUMIN 3.8   CBC: Recent Labs  Lab 07/05/20 0416 07/06/20 0420 07/07/20 0455  WBC 28.0* 17.0* 10.5  NEUTROABS 25.3*  --   --   HGB 12.0 9.8* 10.2*  HCT 35.3* 29.5* 31.2*  MCV 88.3 88.6 89.4  PLT 233 173 178     Recent Results (from the past 240 hour(s))  Blood culture (routine x 2)     Status: Abnormal   Collection Time: 07/05/20  4:10 AM   Specimen: BLOOD  Result Value Ref Range Status   Specimen Description   Final    BLOOD RIGHT ANTECUBITAL Performed at [facility], [address] [place], [place] [address]    Special Requests   Final    BOTTLES DRAWN AEROBIC AND ANAEROBIC Blood Culture adequate volume Performed at [facility], [address] [place], [place] [address]    Culture  Setup Time   Final    Organism ID to follow IN BOTH AEROBIC AND ANAEROBIC BOTTLES GRAM POSITIVE COCCI CRITICAL RESULT CALLED TO, READ BACK BY AND VERIFIED WITH: WALLD NAZARI @1544  ON 07/05/20.[place]elius Performed at [facility], [address] [place], [place] [address]    Culture GROUP B STREP(S.AGALACTIAE)ISOLATED (A)  Final   Report Status 07/07/2020 FINAL  Final   Organism ID, Bacteria GROUP B  STREP(S.AGALACTIAE)ISOLATED  Final      Susceptibility   Group b strep(s.agalactiae)isolated - MIC*    CLINDAMYCIN >=1 RESISTANT Resistant     AMPICILLIN <=0.25 SENSITIVE Sensitive     ERYTHROMYCIN >=8 RESISTANT Resistant     VANCOMYCIN 0.5 SENSITIVE Sensitive     CEFTRIAXONE <=0.12 SENSITIVE Sensitive     LEVOFLOXACIN 0.5 SENSITIVE Sensitive     PENICILLIN Value in next row Sensitive      SENSITIVEMIC =0.06    * GROUP B STREP(S.AGALACTIAE)ISOLATED  Blood Culture ID Panel (Reflexed)     Status: Abnormal   Collection Time: 07/05/20  4:10 AM  Result Value Ref Range Status   Enterococcus faecalis NOT DETECTED NOT DETECTED Final   Enterococcus Faecium NOT DETECTED NOT DETECTED Final   Listeria monocytogenes NOT DETECTED NOT DETECTED Final   Staphylococcus species NOT DETECTED NOT DETECTED Final   Staphylococcus aureus (BCID) NOT DETECTED NOT DETECTED Final   Staphylococcus epidermidis NOT DETECTED NOT DETECTED Final   Staphylococcus lugdunensis NOT DETECTED NOT DETECTED Final   Streptococcus species DETECTED (A) NOT DETECTED Final    Comment: CRITICAL RESULT CALLED TO, READ BACK BY AND VERIFIED WITH: WALLD NAZARI @1544  ON 07/05/20.SH    Streptococcus agalactiae DETECTED (A) NOT DETECTED Final    Comment: CRITICAL RESULT CALLED TO, READ BACK BY AND VERIFIED WITH: WALLD NAZARI @1544  ON 07/05/20.SH    Streptococcus pneumoniae NOT DETECTED NOT DETECTED Final   Streptococcus pyogenes NOT DETECTED NOT DETECTED Final   A.calcoaceticus-baumannii NOT DETECTED NOT DETECTED Final   Bacteroides fragilis NOT DETECTED NOT DETECTED Final   Enterobacterales NOT DETECTED NOT DETECTED Final   Enterobacter cloacae complex NOT DETECTED NOT DETECTED Final   Escherichia coli NOT DETECTED NOT DETECTED Final   Klebsiella aerogenes NOT DETECTED NOT DETECTED Final   Klebsiella oxytoca NOT DETECTED NOT DETECTED Final   Klebsiella pneumoniae NOT DETECTED NOT DETECTED Final   Proteus species NOT DETECTED NOT  DETECTED Final   Salmonella species NOT DETECTED NOT DETECTED Final   Serratia marcescens NOT DETECTED NOT DETECTED Final   Haemophilus influenzae NOT DETECTED NOT DETECTED Final   Neisseria meningitidis NOT DETECTED NOT DETECTED Final   Pseudomonas aeruginosa NOT DETECTED NOT DETECTED Final   Stenotrophomonas maltophilia NOT DETECTED NOT DETECTED Final   Candida albicans NOT DETECTED NOT DETECTED Final   Candida auris NOT DETECTED NOT DETECTED Final   Candida glabrata NOT DETECTED NOT DETECTED Final   Candida krusei NOT DETECTED NOT DETECTED Final   Candida parapsilosis NOT DETECTED NOT DETECTED Final   Candida tropicalis NOT DETECTED NOT DETECTED Final   Cryptococcus neoformans/gattii NOT DETECTED NOT DETECTED Final    Comment: Performed at [facility], [address] [place], [place] [address]  Blood culture (routine x 2)     Status: Abnormal   Collection Time: 07/05/20  4:16 AM   Specimen: BLOOD  Result Value Ref Range Status   Specimen Description   Final    BLOOD LEFT ANTECUBITAL Performed at [facility], [address] [place], [place] [address]    Special Requests   Final    BOTTLES DRAWN AEROBIC AND ANAEROBIC Blood Culture adequate volume Performed at [facility], [address] [place], [place] [address]    Culture  Setup Time   Final    GRAM POSITIVE COCCI IN BOTH AEROBIC AND ANAEROBIC BOTTLES CRITICAL VALUE NOTED.  VALUE IS CONSISTENT WITH PREVIOUSLY REPORTED AND CALLED VALUE.    Culture (A)  Final    GROUP B STREP(S.AGALACTIAE)ISOLATED SUSCEPTIBILITIES PERFORMED ON PREVIOUS CULTURE WITHIN THE LAST 5 DAYS. Performed at [facility], [place] [address] [place], [place] [address]    Report Status 07/07/2020 FINAL  Final  Resp Panel by RT-PCR (Flu A&B, Covid) Nasopharyngeal Swab     Status: None   Collection Time: 07/05/20  4:18 AM   Specimen: Nasopharyngeal Swab; Nasopharyngeal(NP) swabs in vial transport medium  Result Value  Ref Range Status   SARS Coronavirus 2 by RT PCR NEGATIVE NEGATIVE Final    Comment: (NOTE) SARS-CoV-2 target nucleic acids are NOT DETECTED.  The SARS-CoV-2 RNA is generally detectable in upper respiratory specimens during the acute phase of infection. The lowest concentration of SARS-CoV-2 viral copies this assay can detect is 138 copies/mL. A negative result does not preclude SARS-Cov-2 infection and should not be used as the sole basis for treatment or other patient management decisions. A negative result may occur with  improper specimen collection/handling, submission of specimen other than nasopharyngeal swab, presence of viral mutation(s) within the areas targeted by this assay, and inadequate number of viral copies(<138 copies/mL). A negative result must be combined with clinical observations, patient history, and epidemiological information. The expected result is Negative.  Fact Sheet for Patients:  EntrepreneurPulse.com.au  Fact Sheet for Healthcare Providers:  IncredibleEmployment.be  This test is no t yet approved or cleared by the [place] FDA and  has been authorized for detection and/or diagnosis of SARS-CoV-2 by FDA under an Emergency Use Authorization (EUA). This EUA will remain  in effect (meaning this test can be used) for the duration of the COVID-19 declaration under Section 564(b)(1) of the Act, 21 U.S.C.section 360bbb-3(b)(1), unless the authorization is terminated  or revoked sooner.       Influenza A by PCR NEGATIVE NEGATIVE Final   Influenza B by PCR NEGATIVE NEGATIVE Final    Comment: (NOTE) The Xpert Xpress SARS-CoV-2/FLU/RSV plus assay is intended as an aid in the diagnosis of influenza from Nasopharyngeal swab specimens and should not be used as a sole basis for treatment. Nasal washings and aspirates are unacceptable for Xpert Xpress SARS-CoV-2/FLU/RSV testing.  Fact Sheet for  Patients: EntrepreneurPulse.com.au  Fact Sheet for Healthcare Providers: IncredibleEmployment.be  This test is not yet approved or cleared by the [place] FDA and has been authorized for detection and/or diagnosis of SARS-CoV-2 by FDA under an Emergency Use Authorization (EUA). This EUA will remain in effect (meaning this test can be used) for the duration of the COVID-19 declaration under Section 564(b)(1) of the Act, 21 U.S.C. section 360bbb-3(b)(1), unless the authorization is terminated or revoked.  Performed at [facility], [address] [place], [place] [address]  Urine culture     Status: Abnormal   Collection Time: 07/05/20  5:24 AM   Specimen: Urine, Random  Result Value Ref Range Status   Specimen Description   Final    URINE, RANDOM Performed at [facility], [address] [place], [place] [address]    Special Requests   Final    NONE Performed at [facility], [address] [place], [place] [address]    Culture >=100,000 COLONIES/mL ESCHERICHIA COLI (A)  Final   Report Status 07/07/2020 FINAL  Final   Organism ID, Bacteria ESCHERICHIA COLI (A)  Final      Susceptibility   Escherichia coli - MIC*    AMPICILLIN >=32 RESISTANT Resistant     CEFAZOLIN <=4 SENSITIVE Sensitive     CEFEPIME <=0.12 SENSITIVE Sensitive     CEFTRIAXONE <=0.25 SENSITIVE Sensitive     CIPROFLOXACIN <=0.25 SENSITIVE Sensitive     GENTAMICIN <=1 SENSITIVE Sensitive     IMIPENEM <=0.25 SENSITIVE Sensitive     NITROFURANTOIN <=16 SENSITIVE Sensitive     TRIMETH/SULFA <=20 SENSITIVE Sensitive     AMPICILLIN/SULBACTAM >=32 RESISTANT Resistant     PIP/TAZO <=4 SENSITIVE Sensitive     * >=100,000 COLONIES/mL ESCHERICHIA COLI     Studies: US RENAL  Result Date: 07/06/2020 CLINICAL DATA:  Chronic kidney disease EXAM: RENAL / URINARY TRACT ULTRASOUND COMPLETE COMPARISON:  None. FINDINGS: Right Kidney: Renal  measurements: 10.5 x 4.5 x 5.0 cm = volume: 124 mL. Normal echogenicity. Cortical thinning noted. No hydronephrosis or acute finding. No focal abnormality or mass by ultrasound. Left Kidney: Renal measurements: 10.2 x 5.1 x 4.7 cm = volume: 128 mL. Normal echogenicity. More preserved renal cortex. No acute finding or hydronephrosis. No focal abnormality. Bladder: Appears normal for degree of bladder distention. Ureteral jets not demonstrated. Other: No free fluid. IMPRESSION: No acute finding by renal ultrasound. Right kidney cortical thinning noted. Electronically Signed   By: Jerilynn Mages.  Shick M.D.   On: 07/06/2020 11:19   ECHOCARDIOGRAM COMPLETE  Result Date: 07/07/2020    ECHOCARDIOGRAM REPORT   Patient Name:   [name] Date of Exam: 07/07/2020 Medical Rec #:  [id number]      Height:       64.0 in Accession #:    [id number]     Weight:       292.9 lb Date of Birth:  1930/02/29      BSA:          2.301 m Patient Age:    68 years       BP:           100/52 mmHg Patient Gender: F              HR:           88 bpm. Exam Location:  [facility] Procedure: 2D Echo, Cardiac Doppler and Color Doppler Indications:     Bacteremia R78.81  History:         Patient has no prior history of Echocardiogram examinations.                  CHF; Risk Factors:Hypertension.  Sonographer:     Alyse Low Roar Referring Phys:  [place] Diagnosing Phys: Ida Rogue MD IMPRESSIONS  1. Left ventricular ejection fraction, by estimation, is 60 to 65%. The left ventricle has normal function. The left ventricle has no regional wall motion abnormalities. Left ventricular diastolic parameters are indeterminate.  2. Right ventricular systolic function  is normal. The right ventricular size is normal. There is normal pulmonary artery systolic pressure. The estimated right ventricular systolic pressure is 53.9 mmHg.  3. Left atrial size was mildly dilated.  4. The mitral valve is normal in structure. Mild mitral valve regurgitation.  5. No  valve vegetation noted. FINDINGS  Left Ventricle: Left ventricular ejection fraction, by estimation, is 60 to 65%. The left ventricle has normal function. The left ventricle has no regional wall motion abnormalities. The left ventricular internal cavity size was normal in size. There is  no left ventricular hypertrophy. Left ventricular diastolic parameters are indeterminate. Right Ventricle: The right ventricular size is normal. No increase in right ventricular wall thickness. Right ventricular systolic function is normal. There is normal pulmonary artery systolic pressure. The tricuspid regurgitant velocity is 2.48 m/s, and  with an assumed right atrial pressure of 5 mmHg, the estimated right ventricular systolic pressure is 76.7 mmHg. Left Atrium: Left atrial size was mildly dilated. Right Atrium: Right atrial size was normal in size. Pericardium: There is no evidence of pericardial effusion. Mitral Valve: The mitral valve is normal in structure. Mild mitral valve regurgitation. No evidence of mitral valve stenosis. Tricuspid Valve: The tricuspid valve is normal in structure. Tricuspid valve regurgitation is mild . No evidence of tricuspid stenosis. Aortic Valve: The aortic valve is normal in structure. Aortic valve regurgitation is not visualized. No aortic stenosis is present. Aortic valve peak gradient measures 4.8 mmHg. Pulmonic Valve: The pulmonic valve was normal in structure. Pulmonic valve regurgitation is not visualized. No evidence of pulmonic stenosis. Aorta: The aortic root is normal in size and structure. Venous: The inferior vena cava is normal in size with greater than 50% respiratory variability, suggesting right atrial pressure of 3 mmHg. IAS/Shunts: No atrial level shunt detected by color flow Doppler.  LEFT VENTRICLE PLAX 2D LVIDd:         3.69 cm  Diastology LVIDs:         2.69 cm  LV e' medial:    10.40 cm/s LV PW:         1.06 cm  LV E/e' medial:  11.2 LV IVS:        1.21 cm  LV e' lateral:    10.40 cm/s LVOT diam:     1.90 cm  LV E/e' lateral: 11.2 LVOT Area:     2.84 cm  RIGHT VENTRICLE RV Mid diam:    2.65 cm RV S prime:     10.40 cm/s TAPSE (M-mode): 1.7 cm LEFT ATRIUM             Index       RIGHT ATRIUM           Index LA diam:        3.65 cm 1.59 cm/m  RA Area:     11.00 cm LA Vol (A2C):   59.8 ml 25.99 ml/m RA Volume:   18.10 ml  7.87 ml/m LA Vol (A4C):   69.0 ml 29.99 ml/m LA Biplane Vol: 64.4 ml 27.99 ml/m  AORTIC VALVE                PULMONIC VALVE AV Area (Vmax): 2.48 cm    PV Vmax:        0.88 m/s AV Vmax:        109.00 cm/s PV Peak grad:   3.1 mmHg AV Peak Grad:   4.8 mmHg    RVOT Peak grad: 3 mmHg LVOT Vmax:  95.20 cm/s  AORTA Ao Root diam: 2.60 cm MITRAL VALVE                TRICUSPID VALVE MV Area (PHT): 4.49 cm     TR Peak grad:   24.6 mmHg MV Decel Time: 169 msec     TR Vmax:        248.00 cm/s MV E velocity: 117.00 cm/s                             SHUNTS                             Systemic Diam: 1.90 cm Julien Nordmann MD Electronically signed by Julien Nordmann MD Signature Date/Time: 07/07/2020/1:49:16 PM    Final     Scheduled Meds: . apixaban  5 mg Oral BID  . Chlorhexidine Gluconate Cloth  6 each Topical Daily  . cholecalciferol  1,000 Units Oral Daily  . DULoxetine  60 mg Oral Daily  . furosemide  20 mg Oral BID  . ipratropium-albuterol  3 mL Nebulization TID  . levothyroxine  200 mcg Oral Q0600  . montelukast  10 mg Oral QHS  . multivitamin with minerals  1 tablet Oral Daily  . pneumococcal 23 valent vaccine  0.5 mL Intramuscular Tomorrow-1000  . vitamin B-12  500 mcg Oral Daily   Continuous Infusions: .  ceFAZolin (ANCEF) IV      Assessment/Plan:  1. Sepsis, present on admission.  Left lower extremity cellulitis.  Strep agalactiae growing out of blood cultures.  On presentation was tachycardic and elevated white blood cell count.  Antibiotics changed over to Keflex which will cover strep agalactiae and the E. coli in the urine.  Patient also  had lactic acidosis peaking at 2.6 2. Chronic lymphedema.  Patient uses lymphedema treatments at home.  ID recommending Unna boots upon disposition. 3. Chronic diastolic congestive heart failure.  Continue low-dose Lasix but increase dose to twice daily dosing.  4. Chronic atrial fibrillation.  Continue Eliquis for anticoagulation. 5. COPD.  Continue nebulizer treatments.  Better air entry today.  Patient does have a cough.  Hold off on steroids at this time with sepsis. 6. Hypothyroidism unspecified.  Continue levothyroxine 7. Depression.  Continue Cymbalta 8. Chronic kidney disease stage II 9. Hypomagnesemia.  Replaced yesterday. 10. Weakness.  Physical therapy recommending home with home health    Code Status:     Code Status Orders  (From admission, onward)         Start     Ordered   07/05/20 0821  Do not attempt resuscitation (DNR)  Continuous       Question Answer Comment  In the event of cardiac or respiratory ARREST Do not call a "code blue"   In the event of cardiac or respiratory ARREST Do not perform Intubation, CPR, defibrillation or ACLS   In the event of cardiac or respiratory ARREST Use medication by any route, position, wound care, and other measures to relive pain and suffering. May use oxygen, suction and manual treatment of airway obstruction as needed for comfort.   Comments Code status was discussed with patient and she wants to be a DNR      07/05/20 0824        Code Status History    This patient has a current code status but no historical code status.   Advance Care  Planning Activity     Family Communication: Spoke with patient's daughter on the phone Disposition Plan: Status is: Inpatient  Dispo: The patient is from: Home              Anticipated d/c is to: Home with home health.              Patient currently on IV antibiotics treating sepsis   Difficult to place patient.  No.  Consultants:  Infectious disease  Antibiotics:  Ancef  Time  spent: 28 minutes  [place]

## 2020-07-07 NOTE — Plan of Care (Signed)
  Problem: Clinical Measurements: Goal: Signs and symptoms of infection will decrease Outcome: Progressing   Problem: Education: Goal: Knowledge of General Education information will improve Description: Including pain rating scale, medication(s)/side effects and non-pharmacologic comfort measures Outcome: Progressing   Problem: Clinical Measurements: Goal: Cardiovascular complication will be avoided Outcome: Progressing   Problem: Safety: Goal: Ability to remain free from injury will improve Outcome: Progressing

## 2020-07-07 NOTE — Progress Notes (Signed)
CCMD called and reported that pt just have a 2.28 paused. Pt was stable on assessment and VSS. Pt did report that is short winded when eating. NP Ouma made aware but no new order was place. Will continue to monitor.

## 2020-07-07 NOTE — Plan of Care (Signed)
  Problem: Health Behavior/Discharge Planning: Goal: Ability to manage health-related needs will improve Outcome: Progressing   Problem: Clinical Measurements: Goal: Will remain free from infection Outcome: Progressing   Problem: Clinical Measurements: Goal: Respiratory complications will improve Outcome: Progressing   Problem: Clinical Measurements: Goal: Cardiovascular complication will be avoided Outcome: Progressing   Problem: Safety: Goal: Ability to remain free from injury will improve Outcome: Progressing   

## 2020-07-07 NOTE — Progress Notes (Signed)
Mobility Specialist - Progress Note   07/07/20 1100  Mobility  Activity Transferred:  Bed to chair;Transferred:  Chair to bed  Level of Assistance Standby assist, set-up cues, supervision of patient - no hands on  Assistive Device Front wheel walker  Distance Ambulated (ft) 6 ft  Mobility Out of bed to chair with meals  Mobility Response Tolerated well  Mobility performed by Mobility specialist  $Mobility charge 1 Mobility   Pt transferred bed-chair with supervision. MinA to stand to RW. After setting up pt in chair, pulmonology entered for ultrasound. Pt was transferred back to bed. Requests to return to chair for meal after ultrasound.    Kathee Delton Mobility Specialist 07/07/20, 11:53 AM

## 2020-07-07 NOTE — Progress Notes (Signed)
*  PRELIMINARY RESULTS* Echocardiogram 2D Echocardiogram has been performed.  [name] 07/07/2020, 12:46 PM

## 2020-07-07 NOTE — TOC Progression Note (Signed)
Transition of Care [facility]) - Progression Note    Patient Details  Name: [name] MRN: [id number] Date of Birth: [birth year]  [facility] of [facility] CM/SW Contact  Shelbie Hutching, RN Phone Number: 07/07/2020, 4:24 PM  Clinical Narrative:    [facility] accepted for [name] [facility] services.   Expected Discharge Plan: [place] Barriers to Discharge: Continued Medical Work up  Expected Discharge Plan and Services Expected Discharge Plan: [place] In-house Referral: Clinical Social Work Discharge Planning Services: CM Consult Post Acute Care Choice: [place] arrangements for the past 2 months: Apartment                           HH Arranged: RN,PT,OT,Social Work [employer] Agency: [facility] Date [place] Agency Contacted: 07/07/20 Time [place]: 1504 Representative spoke with at [place]: [place] ([place]) Interventions    Readmission Risk Interventions No flowsheet data found.

## 2020-07-07 NOTE — TOC Progression Note (Signed)
Transition of Care [facility]) - Progression Note    Patient Details  Name: [name] MRN: [id number] Date of Birth: 1930/06/21  Transition of Care [facility]) CM/SW Contact  Shelbie Hutching, RN Phone Number: 07/07/2020, 3:04 PM  Clinical Narrative:    PT is recommending home health services at discharge.  Patient will benefit from RN, PT, OT and SW.  Patient chooses [facility] which she has used in the past and liked.  [name] given referral.     Expected Discharge Plan: [place] Barriers to Discharge: Continued Medical Work up  Expected Discharge Plan and Services Expected Discharge Plan: [place] In-house Referral: Clinical Social Work Discharge Planning Services: CM Consult Post Acute Care Choice: [place] arrangements for the past 2 months: Apartment                           HH Arranged: RN,PT,OT,Social Work [employer] Agency: [facility] Date [place] Agency Contacted: 07/07/20 Time [place]: 1504 Representative spoke with at [place]: [place] ([place]) Interventions    Readmission Risk Interventions No flowsheet data found.

## 2020-07-08 DIAGNOSIS — I5033 Acute on chronic diastolic (congestive) heart failure: Secondary | ICD-10-CM

## 2020-07-08 DIAGNOSIS — J441 Chronic obstructive pulmonary disease with (acute) exacerbation: Secondary | ICD-10-CM

## 2020-07-08 LAB — BASIC METABOLIC PANEL
Anion gap: 9 (ref 5–15)
BUN: 32 mg/dL — ABNORMAL HIGH (ref 8–23)
CO2: 25 mmol/L (ref 22–32)
Calcium: 10.6 mg/dL — ABNORMAL HIGH (ref 8.9–10.3)
Chloride: 104 mmol/L (ref 98–111)
Creatinine, Ser: 1.04 mg/dL — ABNORMAL HIGH (ref 0.44–1.00)
GFR, Estimated: 52 mL/min — ABNORMAL LOW (ref >60)
Glucose, Bld: 103 mg/dL — ABNORMAL HIGH (ref 70–99)
Potassium: 4.1 mmol/L (ref 3.5–5.1)
Sodium: 138 mmol/L (ref 135–145)

## 2020-07-08 LAB — MAGNESIUM: Magnesium: 1.7 mg/dL (ref 1.7–2.4)

## 2020-07-08 MED ORDER — FUROSEMIDE 10 MG/ML IJ SOLN
40.0000 mg | Freq: Two times a day (BID) | INTRAMUSCULAR | Status: DC
  Administered 2020-07-08 – 2020-07-09 (×2): 40 mg via INTRAVENOUS
  Filled 2020-07-08 (×2): qty 4

## 2020-07-08 MED ORDER — MAGNESIUM SULFATE 2 GM/50ML IV SOLN
2.0000 g | Freq: Once | INTRAVENOUS | Status: AC
  Administered 2020-07-08: 2 g via INTRAVENOUS
  Filled 2020-07-08: qty 50

## 2020-07-08 MED ORDER — FUROSEMIDE 10 MG/ML IJ SOLN
20.0000 mg | Freq: Two times a day (BID) | INTRAMUSCULAR | Status: DC
  Administered 2020-07-08: 20 mg via INTRAVENOUS
  Filled 2020-07-08: qty 2

## 2020-07-08 MED ORDER — METHYLPREDNISOLONE SODIUM SUCC 40 MG IJ SOLR
40.0000 mg | Freq: Every day | INTRAMUSCULAR | Status: DC
  Administered 2020-07-08 – 2020-07-09 (×2): 40 mg via INTRAVENOUS
  Filled 2020-07-08 (×2): qty 1

## 2020-07-08 MED ORDER — METOPROLOL SUCCINATE ER 25 MG PO TB24
12.5000 mg | ORAL_TABLET | Freq: Every day | ORAL | Status: DC
  Administered 2020-07-08 – 2020-07-09 (×2): 12.5 mg via ORAL
  Filled 2020-07-08 (×2): qty 1

## 2020-07-08 NOTE — Progress Notes (Signed)
Patient ID: [name], female   DOB: May 06, 1930, 87 y.o.   MRN: [id number] [facility] PROGRESS NOTE  Shakilya Forgues [id number] DOB: 1930-07-06 DOA: 07/05/2020 PCP: Ezequiel Kayser, MD  HPI/Subjective: Patient today complains of more shortness of breath and wheezing than previous.  Some cough that is productive but could not tell me what color she coughed up.  Feels better with regards to her strength in her thinking.  Admitted with streptococcal agalactiae sepsis.  Objective: Vitals:   07/08/20 0811 07/08/20 1109  BP: 129/74 (!) 128/57  Pulse: 95 93  Resp: 18 18  Temp: 97.8 F (36.6 C) 98.2 F (36.8 C)  SpO2: 96% 94%    Intake/Output Summary (Last 24 hours) at 07/08/2020 1431 Last data filed at 07/08/2020 1300 Gross per 24 hour  Intake 1060 ml  Output 1950 ml  Net -890 ml   Filed Weights   07/05/20 0414 07/06/20 0450  Weight: 125.7 kg 132.9 kg    ROS: Review of Systems  Respiratory: Positive for cough, shortness of breath and wheezing.   Cardiovascular: Negative for chest pain.  Gastrointestinal: Negative for abdominal pain.  Musculoskeletal: Positive for joint pain.   Exam: Physical Exam HENT:     Head: Normocephalic.     Mouth/Throat:     Pharynx: No oropharyngeal exudate.  Eyes:     General: Lids are normal.     Conjunctiva/sclera: Conjunctivae normal.     Pupils: Pupils are equal, round, and reactive to light.  Cardiovascular:     Rate and Rhythm: Normal rate and regular rhythm.     Heart sounds: Normal heart sounds, S1 normal and S2 normal.  Pulmonary:     Breath sounds: Examination of the right-middle field reveals wheezing. Examination of the left-middle field reveals wheezing. Examination of the right-lower field reveals decreased breath sounds and rhonchi. Examination of the left-lower field reveals decreased breath sounds and rhonchi. Decreased breath sounds, wheezing and rhonchi present. No rales.  Abdominal:     Palpations: Abdomen is soft.      Tenderness: There is no abdominal tenderness.  Musculoskeletal:     Right lower leg: Swelling present.     Left lower leg: Swelling present.  Skin:    General: Skin is warm.     Comments: Fading erythema left lower extremity  Neurological:     Mental Status: She is alert and oriented to person, place, and time.       Data Reviewed: Basic Metabolic Panel: Recent Labs  Lab 07/05/20 0416 07/06/20 0420 07/07/20 0455 07/08/20 0450  NA 134* 132* 135 138  K 3.9 3.8 4.1 4.1  CL 101 103 104 104  CO2 25 22 24 25   GLUCOSE 102* 119* 122* 103*  BUN 33* 33* 28* 32*  CREATININE 1.14* 1.15* 0.90 1.04*  CALCIUM 10.6* 9.9 10.2 10.6*  MG 1.5*  --  1.9 1.7   Liver Function Tests: Recent Labs  Lab 07/05/20 0416  AST 27  ALT 18  ALKPHOS 66  BILITOT 1.1  PROT 7.0  ALBUMIN 3.8   CBC: Recent Labs  Lab 07/05/20 0416 07/06/20 0420 07/07/20 0455  WBC 28.0* 17.0* 10.5  NEUTROABS 25.3*  --   --   HGB 12.0 9.8* 10.2*  HCT 35.3* 29.5* 31.2*  MCV 88.3 88.6 89.4  PLT 233 173 178     Recent Results (from the past 240 hour(s))  Blood culture (routine x 2)     Status: Abnormal   Collection Time: 07/05/20  4:10  AM   Specimen: BLOOD  Result Value Ref Range Status   Specimen Description   Final    BLOOD RIGHT ANTECUBITAL Performed at [facility], [address] [place], [place] [address]    Special Requests   Final    BOTTLES DRAWN AEROBIC AND ANAEROBIC Blood Culture adequate volume Performed at [facility], [address] [place], [place] [address]    Culture  Setup Time   Final    Organism ID to follow IN BOTH AEROBIC AND ANAEROBIC BOTTLES GRAM POSITIVE COCCI CRITICAL RESULT CALLED TO, READ BACK BY AND VERIFIED WITH: WALLD NAZARI @1544  ON 07/05/20.[place] Performed at [facility], [address] [place], [place] [address]    Culture GROUP B STREP(S.AGALACTIAE)ISOLATED (A)  Final   Report Status 07/07/2020 FINAL  Final   Organism ID,  Bacteria GROUP B STREP(S.AGALACTIAE)ISOLATED  Final      Susceptibility   Group b strep(s.agalactiae)isolated - MIC*    CLINDAMYCIN >=1 RESISTANT Resistant     AMPICILLIN <=0.25 SENSITIVE Sensitive     ERYTHROMYCIN >=8 RESISTANT Resistant     VANCOMYCIN 0.5 SENSITIVE Sensitive     CEFTRIAXONE <=0.12 SENSITIVE Sensitive     LEVOFLOXACIN 0.5 SENSITIVE Sensitive     PENICILLIN Value in next row Sensitive      SENSITIVEMIC =0.06    * GROUP B STREP(S.AGALACTIAE)ISOLATED  Blood Culture ID Panel (Reflexed)     Status: Abnormal   Collection Time: 07/05/20  4:10 AM  Result Value Ref Range Status   Enterococcus faecalis NOT DETECTED NOT DETECTED Final   Enterococcus Faecium NOT DETECTED NOT DETECTED Final   Listeria monocytogenes NOT DETECTED NOT DETECTED Final   Staphylococcus species NOT DETECTED NOT DETECTED Final   Staphylococcus aureus (BCID) NOT DETECTED NOT DETECTED Final   Staphylococcus epidermidis NOT DETECTED NOT DETECTED Final   Staphylococcus lugdunensis NOT DETECTED NOT DETECTED Final   Streptococcus species DETECTED (A) NOT DETECTED Final    Comment: CRITICAL RESULT CALLED TO, READ BACK BY AND VERIFIED WITH: WALLD NAZARI @1544  ON 07/05/20.[place]    Streptococcus agalactiae DETECTED (A) NOT DETECTED Final    Comment: CRITICAL RESULT CALLED TO, READ BACK BY AND VERIFIED WITH: WALLD NAZARI @1544  ON 07/05/20.SH    Streptococcus pneumoniae NOT DETECTED NOT DETECTED Final   Streptococcus pyogenes NOT DETECTED NOT DETECTED Final   A.calcoaceticus-baumannii NOT DETECTED NOT DETECTED Final   Bacteroides fragilis NOT DETECTED NOT DETECTED Final   Enterobacterales NOT DETECTED NOT DETECTED Final   Enterobacter cloacae complex NOT DETECTED NOT DETECTED Final   Escherichia coli NOT DETECTED NOT DETECTED Final   Klebsiella aerogenes NOT DETECTED NOT DETECTED Final   Klebsiella oxytoca NOT DETECTED NOT DETECTED Final   Klebsiella pneumoniae NOT DETECTED NOT DETECTED Final   Proteus species  NOT DETECTED NOT DETECTED Final   Salmonella species NOT DETECTED NOT DETECTED Final   Serratia marcescens NOT DETECTED NOT DETECTED Final   Haemophilus influenzae NOT DETECTED NOT DETECTED Final   Neisseria meningitidis NOT DETECTED NOT DETECTED Final   Pseudomonas aeruginosa NOT DETECTED NOT DETECTED Final   Stenotrophomonas maltophilia NOT DETECTED NOT DETECTED Final   Candida albicans NOT DETECTED NOT DETECTED Final   Candida auris NOT DETECTED NOT DETECTED Final   Candida glabrata NOT DETECTED NOT DETECTED Final   Candida krusei NOT DETECTED NOT DETECTED Final   Candida parapsilosis NOT DETECTED NOT DETECTED Final   Candida tropicalis NOT DETECTED NOT DETECTED Final   Cryptococcus neoformans/gattii NOT DETECTED NOT DETECTED Final  Comment: Performed at [facility], [address] [place], [place] [address]  Blood culture (routine x 2)     Status: Abnormal   Collection Time: 07/05/20  4:16 AM   Specimen: BLOOD  Result Value Ref Range Status   Specimen Description   Final    BLOOD LEFT ANTECUBITAL Performed at [facility], [address] [place], [place] [address]    Special Requests   Final    BOTTLES DRAWN AEROBIC AND ANAEROBIC Blood Culture adequate volume Performed at [facility], [address], [place], [place] [address]    Culture  Setup Time   Final    GRAM POSITIVE COCCI IN BOTH AEROBIC AND ANAEROBIC BOTTLES CRITICAL VALUE NOTED.  VALUE IS CONSISTENT WITH PREVIOUSLY REPORTED AND CALLED VALUE.    Culture (A)  Final    GROUP B STREP(S.AGALACTIAE)ISOLATED SUSCEPTIBILITIES PERFORMED ON PREVIOUS CULTURE WITHIN THE LAST 5 DAYS. Performed at [facility], [place] [address], [place], [place] [address]    Report Status 07/07/2020 FINAL  Final  Resp Panel by RT-PCR (Flu A&B, Covid) Nasopharyngeal Swab     Status: None   Collection Time: 07/05/20  4:18 AM   Specimen: Nasopharyngeal Swab; Nasopharyngeal(NP) swabs in vial transport medium   Result Value Ref Range Status   SARS Coronavirus 2 by RT PCR NEGATIVE NEGATIVE Final    Comment: (NOTE) SARS-CoV-2 target nucleic acids are NOT DETECTED.  The SARS-CoV-2 RNA is generally detectable in upper respiratory specimens during the acute phase of infection. The lowest concentration of SARS-CoV-2 viral copies this assay can detect is 138 copies/mL. A negative result does not preclude SARS-Cov-2 infection and should not be used as the sole basis for treatment or other patient management decisions. A negative result may occur with  improper specimen collection/handling, submission of specimen other than nasopharyngeal swab, presence of viral mutation(s) within the areas targeted by this assay, and inadequate number of viral copies(<138 copies/mL). A negative result must be combined with clinical observations, patient history, and epidemiological information. The expected result is Negative.  Fact Sheet for Patients:  EntrepreneurPulse.com.au  Fact Sheet for Healthcare Providers:  IncredibleEmployment.be  This test is no t yet approved or cleared by the [place] FDA and  has been authorized for detection and/or diagnosis of SARS-CoV-2 by FDA under an Emergency Use Authorization (EUA). This EUA will remain  in effect (meaning this test can be used) for the duration of the COVID-19 declaration under Section 564(b)(1) of the Act, 21 U.S.C.section 360bbb-3(b)(1), unless the authorization is terminated  or revoked sooner.       Influenza A by PCR NEGATIVE NEGATIVE Final   Influenza B by PCR NEGATIVE NEGATIVE Final    Comment: (NOTE) The Xpert Xpress SARS-CoV-2/FLU/RSV plus assay is intended as an aid in the diagnosis of influenza from Nasopharyngeal swab specimens and should not be used as a sole basis for treatment. Nasal washings and aspirates are unacceptable for Xpert Xpress SARS-CoV-2/FLU/RSV testing.  Fact Sheet for  Patients: EntrepreneurPulse.com.au  Fact Sheet for Healthcare Providers: IncredibleEmployment.be  This test is not yet approved or cleared by the [place] FDA and has been authorized for detection and/or diagnosis of SARS-CoV-2 by FDA under an Emergency Use Authorization (EUA). This EUA will remain in effect (meaning this test can be used) for the duration of the COVID-19 declaration under Section 564(b)(1) of the Act, 21 U.S.C. section 360bbb-3(b)(1), unless the authorization is terminated or revoked.  Performed at [facility], [address] [place], [place] [address]   Urine culture     Status: Abnormal   Collection Time: 07/05/20  5:24 AM   Specimen: Urine, Random  Result Value Ref Range Status   Specimen Description   Final    URINE, RANDOM Performed at [facility], [address] [place], [place] [address]    Special Requests   Final    NONE Performed at [facility], [address] [place], [place] [address]    Culture >=100,000 COLONIES/mL ESCHERICHIA COLI (A)  Final   Report Status 07/07/2020 FINAL  Final   Organism ID, Bacteria ESCHERICHIA COLI (A)  Final      Susceptibility   Escherichia coli - MIC*    AMPICILLIN >=32 RESISTANT Resistant     CEFAZOLIN <=4 SENSITIVE Sensitive     CEFEPIME <=0.12 SENSITIVE Sensitive     CEFTRIAXONE <=0.25 SENSITIVE Sensitive     CIPROFLOXACIN <=0.25 SENSITIVE Sensitive     GENTAMICIN <=1 SENSITIVE Sensitive     IMIPENEM <=0.25 SENSITIVE Sensitive     NITROFURANTOIN <=16 SENSITIVE Sensitive     TRIMETH/SULFA <=20 SENSITIVE Sensitive     AMPICILLIN/SULBACTAM >=32 RESISTANT Resistant     PIP/TAZO <=4 SENSITIVE Sensitive     * >=100,000 COLONIES/mL ESCHERICHIA COLI     Studies: ECHOCARDIOGRAM COMPLETE  Result Date: 07/07/2020    ECHOCARDIOGRAM REPORT   Patient Name:   KELISHA DALL Date of Exam: 07/07/2020 Medical Rec #:  [id number]      Height:        64.0 in Accession #:    [id number]     Weight:       292.9 lb Date of Birth:  09/20/1930      BSA:          2.301 m Patient Age:    3 years       BP:           100/52 mmHg Patient Gender: F              HR:           88 bpm. Exam Location:  [facility] Procedure: 2D Echo, Cardiac Doppler and Color Doppler Indications:     Bacteremia R78.81  History:         Patient has no prior history of Echocardiogram examinations.                  CHF; Risk Factors:Hypertension.  Sonographer:     Alyse Low Roar Referring Phys:  [place] Diagnosing Phys: Ida Rogue MD IMPRESSIONS  1. Left ventricular ejection fraction, by estimation, is 60 to 65%. The left ventricle has normal function. The left ventricle has no regional wall motion abnormalities. Left ventricular diastolic parameters are indeterminate.  2. Right ventricular systolic function is normal. The right ventricular size is normal. There is normal pulmonary artery systolic pressure. The estimated right ventricular systolic pressure is 35.7 mmHg.  3. Left atrial size was mildly dilated.  4. The mitral valve is normal in structure. Mild mitral valve regurgitation.  5. No valve vegetation noted. FINDINGS  Left Ventricle: Left ventricular ejection fraction, by estimation, is 60 to 65%. The left ventricle has normal function. The left ventricle has no regional wall motion abnormalities. The left ventricular internal cavity size was normal in size. There is  no left ventricular hypertrophy. Left ventricular diastolic parameters are indeterminate. Right Ventricle: The right ventricular size is normal. No increase in right ventricular wall thickness. Right  ventricular systolic function is normal. There is normal pulmonary artery systolic pressure. The tricuspid regurgitant velocity is 2.48 m/s, and  with an assumed right atrial pressure of 5 mmHg, the estimated right ventricular systolic pressure is 85.8 mmHg. Left Atrium: Left atrial size was mildly dilated. Right  Atrium: Right atrial size was normal in size. Pericardium: There is no evidence of pericardial effusion. Mitral Valve: The mitral valve is normal in structure. Mild mitral valve regurgitation. No evidence of mitral valve stenosis. Tricuspid Valve: The tricuspid valve is normal in structure. Tricuspid valve regurgitation is mild . No evidence of tricuspid stenosis. Aortic Valve: The aortic valve is normal in structure. Aortic valve regurgitation is not visualized. No aortic stenosis is present. Aortic valve peak gradient measures 4.8 mmHg. Pulmonic Valve: The pulmonic valve was normal in structure. Pulmonic valve regurgitation is not visualized. No evidence of pulmonic stenosis. Aorta: The aortic root is normal in size and structure. Venous: The inferior vena cava is normal in size with greater than 50% respiratory variability, suggesting right atrial pressure of 3 mmHg. IAS/Shunts: No atrial level shunt detected by color flow Doppler.  LEFT VENTRICLE PLAX 2D LVIDd:         3.69 cm  Diastology LVIDs:         2.69 cm  LV e' medial:    10.40 cm/s LV PW:         1.06 cm  LV E/e' medial:  11.2 LV IVS:        1.21 cm  LV e' lateral:   10.40 cm/s LVOT diam:     1.90 cm  LV E/e' lateral: 11.2 LVOT Area:     2.84 cm  RIGHT VENTRICLE RV Mid diam:    2.65 cm RV S prime:     10.40 cm/s TAPSE (M-mode): 1.7 cm LEFT ATRIUM             Index       RIGHT ATRIUM           Index LA diam:        3.65 cm 1.59 cm/m  RA Area:     11.00 cm LA Vol (A2C):   59.8 ml 25.99 ml/m RA Volume:   18.10 ml  7.87 ml/m LA Vol (A4C):   69.0 ml 29.99 ml/m LA Biplane Vol: 64.4 ml 27.99 ml/m  AORTIC VALVE                PULMONIC VALVE AV Area (Vmax): 2.48 cm    PV Vmax:        0.88 m/s AV Vmax:        109.00 cm/s PV Peak grad:   3.1 mmHg AV Peak Grad:   4.8 mmHg    RVOT Peak grad: 3 mmHg LVOT Vmax:      95.20 cm/s  AORTA Ao Root diam: 2.60 cm MITRAL VALVE                TRICUSPID VALVE MV Area (PHT): 4.49 cm     TR Peak grad:   24.6 mmHg MV Decel  Time: 169 msec     TR Vmax:        248.00 cm/s MV E velocity: 117.00 cm/s                             SHUNTS  Systemic Diam: 1.90 cm Ida Rogue MD Electronically signed by Ida Rogue MD Signature Date/Time: 07/07/2020/1:49:16 PM    Final     Scheduled Meds: . apixaban  5 mg Oral BID  . Chlorhexidine Gluconate Cloth  6 each Topical Daily  . cholecalciferol  1,000 Units Oral Daily  . DULoxetine  60 mg Oral Daily  . furosemide  20 mg Intravenous BID  . ipratropium-albuterol  3 mL Nebulization TID  . levothyroxine  200 mcg Oral Q0600  . methylPREDNISolone (SOLU-MEDROL) injection  40 mg Intravenous Daily  . montelukast  10 mg Oral QHS  . multivitamin with minerals  1 tablet Oral Daily  . pneumococcal 23 valent vaccine  0.5 mL Intramuscular Tomorrow-1000  . vitamin B-12  500 mcg Oral Daily   Continuous Infusions: .  ceFAZolin (ANCEF) IV 2 g (07/08/20 1402)    Assessment/Plan:  1. Sepsis, present on admission.  Left lower extremity cellulitis.  Strep agalactiae growing out of blood cultures.  E. coli growing out of urine culture.  Patient on Keflex.  Patient did have lactic acidosis peaking at 2.6. 2. Acute on chronic diastolic congestive heart failure.  Change Lasix over to IV twice a day dosing.  Add low-dose Toprol at night. 3. Chronic lymphedema.  Patient uses lymphedema treatments at home.  ID recommending Unna boots upon disposition I will have the wound care team evaluate. 4. COPD exacerbation continue nebulizer treatments and steroids today.  Check chest x-ray tomorrow morning. 5. Chronic atrial fibrillation on Eliquis for anticoagulation.  Add Toprol-XL for heart rate control. 6. Hypothyroidism unspecified.  Continue levothyroxine 7. Depression.  Continue Cymbalta 8. Chronic kidney disease stage II 9. Hypomagnesemia.  Replace IV magnesium today 10. Weakness.  Continue working with physical therapy.       Code Status:     Code Status Orders   (From admission, onward)         Start     Ordered   07/05/20 0821  Do not attempt resuscitation (DNR)  Continuous       Question Answer Comment  In the event of cardiac or respiratory ARREST Do not call a "code blue"   In the event of cardiac or respiratory ARREST Do not perform Intubation, CPR, defibrillation or ACLS   In the event of cardiac or respiratory ARREST Use medication by any route, position, wound care, and other measures to relive pain and suffering. May use oxygen, suction and manual treatment of airway obstruction as needed for comfort.   Comments Code status was discussed with patient and she wants to be a DNR      07/05/20 0824        Code Status History    This patient has a current code status but no historical code status.   Advance Care Planning Activity     Family Communication: Spoke with daughter on the phone Disposition Plan: Status is: Inpatient  Dispo: The patient is from: Home              Anticipated d/c is to: Home with home health in the next day or so depending on progress              Patient currently with shortness of breath today switched Lasix over to IV and added IV Solu-Medrol.   Difficult to place patient.  No.  Consultants:  Infectious disease  Antibiotics:  Ancef  Time spent: 27 minutes  [place]

## 2020-07-08 NOTE — Plan of Care (Signed)
  Problem: Respiratory: Goal: Ability to maintain adequate ventilation will improve Outcome: Progressing   Problem: Clinical Measurements: Goal: Cardiovascular complication will be avoided Outcome: Progressing   Problem: Clinical Measurements: Goal: Signs and symptoms of infection will decrease Outcome: Progressing   Problem: Pain Managment: Goal: General experience of comfort will improve Outcome: Progressing   Problem: Safety: Goal: Ability to remain free from injury will improve Outcome: Progressing

## 2020-07-09 ENCOUNTER — Inpatient Hospital Stay: Payer: Medicare HMO

## 2020-07-09 DIAGNOSIS — R410 Disorientation, unspecified: Secondary | ICD-10-CM

## 2020-07-09 LAB — BASIC METABOLIC PANEL
Anion gap: 8 (ref 5–15)
BUN: 40 mg/dL — ABNORMAL HIGH (ref 8–23)
CO2: 29 mmol/L (ref 22–32)
Calcium: 10.5 mg/dL — ABNORMAL HIGH (ref 8.9–10.3)
Chloride: 98 mmol/L (ref 98–111)
Creatinine, Ser: 1.14 mg/dL — ABNORMAL HIGH (ref 0.44–1.00)
GFR, Estimated: 47 mL/min — ABNORMAL LOW (ref >60)
Glucose, Bld: 128 mg/dL — ABNORMAL HIGH (ref 70–99)
Potassium: 4.7 mmol/L (ref 3.5–5.1)
Sodium: 135 mmol/L (ref 135–145)

## 2020-07-09 LAB — MAGNESIUM: Magnesium: 1.8 mg/dL (ref 1.7–2.4)

## 2020-07-09 MED ORDER — TRAMADOL HCL 50 MG PO TABS
50.0000 mg | ORAL_TABLET | Freq: Once | ORAL | Status: DC
Start: 2020-07-09 — End: 2020-07-09

## 2020-07-09 MED ORDER — FUROSEMIDE 20 MG PO TABS
20.0000 mg | ORAL_TABLET | Freq: Two times a day (BID) | ORAL | Status: DC
  Administered 2020-07-10: 20 mg via ORAL
  Filled 2020-07-09: qty 1

## 2020-07-09 MED ORDER — TRAZODONE HCL 50 MG PO TABS
50.0000 mg | ORAL_TABLET | Freq: Every day | ORAL | Status: DC
  Administered 2020-07-09: 50 mg via ORAL
  Filled 2020-07-09: qty 1

## 2020-07-09 NOTE — Progress Notes (Signed)
Patient ID: [name], female   DOB: [birth year], 87 y.o.   MRN: [id number] [facility] PROGRESS NOTE  [name] [id number] DOB: 04/02/1930 DOA: 07/05/2020 PCP: Ezequiel Kayser, MD  HPI/Subjective:   Objective: Vitals:   07/09/20 1127 07/09/20 1323  BP: 126/73   Pulse: 72   Resp: 20   Temp: 97.9 F (36.6 C)   SpO2: 93% 93%    Intake/Output Summary (Last 24 hours) at 07/09/2020 1513 Last data filed at 07/09/2020 1330 Gross per 24 hour  Intake 1900 ml  Output 3550 ml  Net -1650 ml   Filed Weights   07/05/20 0414 07/06/20 0450 07/09/20 0405  Weight: 125.7 kg 132.9 kg 127.2 kg    ROS: Review of Systems  Respiratory: Positive for shortness of breath.   Cardiovascular: Negative for chest pain.  Gastrointestinal: Negative for abdominal pain, nausea and vomiting.   Exam: Physical Exam HENT:     Head: Normocephalic.     Mouth/Throat:     Pharynx: No oropharyngeal exudate.  Eyes:     General: Lids are normal.     Conjunctiva/sclera: Conjunctivae normal.  Cardiovascular:     Rate and Rhythm: Normal rate and regular rhythm.     Heart sounds: Normal heart sounds, S1 normal and S2 normal.  Pulmonary:     Breath sounds: Examination of the right-lower field reveals decreased breath sounds and wheezing. Examination of the left-lower field reveals decreased breath sounds and wheezing. Decreased breath sounds and wheezing present. No rhonchi or rales.  Abdominal:     Palpations: Abdomen is soft.     Tenderness: There is no abdominal tenderness.  Musculoskeletal:     Right lower leg: Swelling present.     Left lower leg: Swelling present.  Skin:    General: Skin is warm.     Comments: redness left lower extremity starting to fade.  Chronic right lower extremity discoloration.  Neurological:     Mental Status: She is alert.     Comments: Answers some questions.  Daughter thought she was more confused today.  States that she has been seeing things.  Did not sleep  well last night       Data Reviewed: Basic Metabolic Panel: Recent Labs  Lab 07/05/20 0416 07/06/20 0420 07/07/20 0455 07/08/20 0450 07/09/20 0350  NA 134* 132* 135 138 135  K 3.9 3.8 4.1 4.1 4.7  CL 101 103 104 104 98  CO2 25 22 24 25 29   GLUCOSE 102* 119* 122* 103* 128*  BUN 33* 33* 28* 32* 40*  CREATININE 1.14* 1.15* 0.90 1.04* 1.14*  CALCIUM 10.6* 9.9 10.2 10.6* 10.5*  MG 1.5*  --  1.9 1.7 1.8   Liver Function Tests: Recent Labs  Lab 07/05/20 0416  AST 27  ALT 18  ALKPHOS 66  BILITOT 1.1  PROT 7.0  ALBUMIN 3.8   CBC: Recent Labs  Lab 07/05/20 0416 07/06/20 0420 07/07/20 0455  WBC 28.0* 17.0* 10.5  NEUTROABS 25.3*  --   --   HGB 12.0 9.8* 10.2*  HCT 35.3* 29.5* 31.2*  MCV 88.3 88.6 89.4  PLT 233 173 178     Recent Results (from the past 240 hour(s))  Blood culture (routine x 2)     Status: Abnormal   Collection Time: 07/05/20  4:10 AM   Specimen: BLOOD  Result Value Ref Range Status   Specimen Description   Final    BLOOD RIGHT ANTECUBITAL Performed at [facility], [name]  [place], [place] [address]    Special Requests   Final    BOTTLES DRAWN AEROBIC AND ANAEROBIC Blood Culture adequate volume Performed at [facility], [address] [place], [place] [address]    Culture  Setup Time   Final    Organism ID to follow IN BOTH AEROBIC AND ANAEROBIC BOTTLES GRAM POSITIVE COCCI CRITICAL RESULT CALLED TO, READ BACK BY AND VERIFIED WITH: WALLD NAZARI @1544  ON 07/05/20.[place] Performed at [facility], [address] [place], [place] [address]    Culture GROUP B STREP(S.AGALACTIAE)ISOLATED (A)  Final   Report Status 07/07/2020 FINAL  Final   Organism ID, Bacteria GROUP B STREP(S.AGALACTIAE)ISOLATED  Final      Susceptibility   Group b strep(s.agalactiae)isolated - MIC*    CLINDAMYCIN >=1 RESISTANT Resistant     AMPICILLIN <=0.25 SENSITIVE Sensitive     ERYTHROMYCIN >=8 RESISTANT Resistant     VANCOMYCIN  0.5 SENSITIVE Sensitive     CEFTRIAXONE <=0.12 SENSITIVE Sensitive     LEVOFLOXACIN 0.5 SENSITIVE Sensitive     PENICILLIN Value in next row Sensitive      SENSITIVEMIC =0.06    * GROUP B STREP(S.AGALACTIAE)ISOLATED  Blood Culture ID Panel (Reflexed)     Status: Abnormal   Collection Time: 07/05/20  4:10 AM  Result Value Ref Range Status   Enterococcus faecalis NOT DETECTED NOT DETECTED Final   Enterococcus Faecium NOT DETECTED NOT DETECTED Final   Listeria monocytogenes NOT DETECTED NOT DETECTED Final   Staphylococcus species NOT DETECTED NOT DETECTED Final   Staphylococcus aureus (BCID) NOT DETECTED NOT DETECTED Final   Staphylococcus epidermidis NOT DETECTED NOT DETECTED Final   Staphylococcus lugdunensis NOT DETECTED NOT DETECTED Final   Streptococcus species DETECTED (A) NOT DETECTED Final    Comment: CRITICAL RESULT CALLED TO, READ BACK BY AND VERIFIED WITH: WALLD NAZARI @1544  ON 07/05/20.[place]    Streptococcus agalactiae DETECTED (A) NOT DETECTED Final    Comment: CRITICAL RESULT CALLED TO, READ BACK BY AND VERIFIED WITH: WALLD NAZARI @1544  ON 07/05/20.[place]    Streptococcus pneumoniae NOT DETECTED NOT DETECTED Final   Streptococcus pyogenes NOT DETECTED NOT DETECTED Final   A.calcoaceticus-baumannii NOT DETECTED NOT DETECTED Final   Bacteroides fragilis NOT DETECTED NOT DETECTED Final   Enterobacterales NOT DETECTED NOT DETECTED Final   Enterobacter cloacae complex NOT DETECTED NOT DETECTED Final   Escherichia coli NOT DETECTED NOT DETECTED Final   Klebsiella aerogenes NOT DETECTED NOT DETECTED Final   Klebsiella oxytoca NOT DETECTED NOT DETECTED Final   Klebsiella pneumoniae NOT DETECTED NOT DETECTED Final   Proteus species NOT DETECTED NOT DETECTED Final   Salmonella species NOT DETECTED NOT DETECTED Final   Serratia marcescens NOT DETECTED NOT DETECTED Final   Haemophilus influenzae NOT DETECTED NOT DETECTED Final   Neisseria meningitidis NOT DETECTED NOT DETECTED Final    Pseudomonas aeruginosa NOT DETECTED NOT DETECTED Final   Stenotrophomonas maltophilia NOT DETECTED NOT DETECTED Final   Candida albicans NOT DETECTED NOT DETECTED Final   Candida auris NOT DETECTED NOT DETECTED Final   Candida glabrata NOT DETECTED NOT DETECTED Final   Candida krusei NOT DETECTED NOT DETECTED Final   Candida parapsilosis NOT DETECTED NOT DETECTED Final   Candida tropicalis NOT DETECTED NOT DETECTED Final   Cryptococcus neoformans/gattii NOT DETECTED NOT DETECTED Final    Comment: Performed at [facility], [address] [place],  [address]  Blood culture (routine x 2)     Status: Abnormal   Collection Time: 07/05/20  4:16  AM   Specimen: BLOOD  Result Value Ref Range Status   Specimen Description   Final    BLOOD LEFT ANTECUBITAL Performed at [facility], [address], [place], [place] [address]    Special Requests   Final    BOTTLES DRAWN AEROBIC AND ANAEROBIC Blood Culture adequate volume Performed at [facility], [address] [place], [place] [address]    Culture  Setup Time   Final    GRAM POSITIVE COCCI IN BOTH AEROBIC AND ANAEROBIC BOTTLES CRITICAL VALUE NOTED.  VALUE IS CONSISTENT WITH PREVIOUSLY REPORTED AND CALLED VALUE.    Culture (A)  Final    GROUP B STREP(S.AGALACTIAE)ISOLATED SUSCEPTIBILITIES PERFORMED ON PREVIOUS CULTURE WITHIN THE LAST 5 DAYS. Performed at [facility], [place] [address] [place], [place] [address]    Report Status 07/07/2020 FINAL  Final  Resp Panel by RT-PCR (Flu A&B, Covid) Nasopharyngeal Swab     Status: None   Collection Time: 07/05/20  4:18 AM   Specimen: Nasopharyngeal Swab; Nasopharyngeal(NP) swabs in vial transport medium  Result Value Ref Range Status   SARS Coronavirus 2 by RT PCR NEGATIVE NEGATIVE Final    Comment: (NOTE) SARS-CoV-2 target nucleic acids are NOT DETECTED.  The SARS-CoV-2 RNA is generally detectable in upper respiratory specimens during the acute phase  of infection. The lowest concentration of SARS-CoV-2 viral copies this assay can detect is 138 copies/mL. A negative result does not preclude SARS-Cov-2 infection and should not be used as the sole basis for treatment or other patient management decisions. A negative result may occur with  improper specimen collection/handling, submission of specimen other than nasopharyngeal swab, presence of viral mutation(s) within the areas targeted by this assay, and inadequate number of viral copies(<138 copies/mL). A negative result must be combined with clinical observations, patient history, and epidemiological information. The expected result is Negative.  Fact Sheet for Patients:  EntrepreneurPulse.com.au  Fact Sheet for Healthcare Providers:  IncredibleEmployment.be  This test is no t yet approved or cleared by the [place] FDA and  has been authorized for detection and/or diagnosis of SARS-CoV-2 by FDA under an Emergency Use Authorization (EUA). This EUA will remain  in effect (meaning this test can be used) for the duration of the COVID-19 declaration under Section 564(b)(1) of the Act, 21 U.S.C.section 360bbb-3(b)(1), unless the authorization is terminated  or revoked sooner.       Influenza A by PCR NEGATIVE NEGATIVE Final   Influenza B by PCR NEGATIVE NEGATIVE Final    Comment: (NOTE) The Xpert Xpress SARS-CoV-2/FLU/RSV plus assay is intended as an aid in the diagnosis of influenza from Nasopharyngeal swab specimens and should not be used as a sole basis for treatment. Nasal washings and aspirates are unacceptable for Xpert Xpress SARS-CoV-2/FLU/RSV testing.  Fact Sheet for Patients: EntrepreneurPulse.com.au  Fact Sheet for Healthcare Providers: IncredibleEmployment.be  This test is not yet approved or cleared by the [place] FDA and has been authorized for detection and/or diagnosis of  SARS-CoV-2 by FDA under an Emergency Use Authorization (EUA). This EUA will remain in effect (meaning this test can be used) for the duration of the COVID-19 declaration under Section 564(b)(1) of the Act, 21 U.S.C. section 360bbb-3(b)(1), unless the authorization is terminated or revoked.  Performed at [facility], [address], [place],  [address]   Urine culture     Status: Abnormal   Collection Time: 07/05/20  5:24 AM   Specimen: Urine, Random  Result Value Ref  Range Status   Specimen Description   Final    URINE, RANDOM Performed at [facility], [address] [place], [place] [address]    Special Requests   Final    NONE Performed at [facility], [place], [place] [address]    Culture >=100,000 COLONIES/mL ESCHERICHIA COLI (A)  Final   Report Status 07/07/2020 FINAL  Final   Organism ID, Bacteria ESCHERICHIA COLI (A)  Final      Susceptibility   Escherichia coli - MIC*    AMPICILLIN >=32 RESISTANT Resistant     CEFAZOLIN <=4 SENSITIVE Sensitive     CEFEPIME <=0.12 SENSITIVE Sensitive     CEFTRIAXONE <=0.25 SENSITIVE Sensitive     CIPROFLOXACIN <=0.25 SENSITIVE Sensitive     GENTAMICIN <=1 SENSITIVE Sensitive     IMIPENEM <=0.25 SENSITIVE Sensitive     NITROFURANTOIN <=16 SENSITIVE Sensitive     TRIMETH/SULFA <=20 SENSITIVE Sensitive     AMPICILLIN/SULBACTAM >=32 RESISTANT Resistant     PIP/TAZO <=4 SENSITIVE Sensitive     * >=100,000 COLONIES/mL ESCHERICHIA COLI     Studies: DG Chest Port 1 View  Result Date: 07/09/2020 CLINICAL DATA:  Shortness of breath with productive cough. EXAM: PORTABLE CHEST 1 VIEW COMPARISON:  07/05/2020; 10/03/2015 FINDINGS: Grossly unchanged enlarged cardiac silhouette and mediastinal contours with tortuosity and potential ectasia the thoracic aorta. There is persistent rightward deviation of the tracheal air column at the level of the thoracic inlet as well as deviation of the  cardiomediastinal structures into the right hemithorax, unchanged. There is unchanged mild diffuse slightly nodular thickening of the pulmonary interstitium. Minimal perihilar and medial basilar opacities, unchanged. No new focal airspace opacities. No pleural effusion or pneumothorax. No evidence of edema. No acute osseous abnormalities. Degenerative change the right glenohumeral and acromioclavicular joints, incompletely evaluated. IMPRESSION: Similar findings of cardiomegaly and chronic bronchitic change without superimposed acute cardiopulmonary disease. Electronically Signed   By: Sandi Mariscal M.D.   On: 07/09/2020 08:36    Scheduled Meds: . apixaban  5 mg Oral BID  . Chlorhexidine Gluconate Cloth  6 each Topical Daily  . cholecalciferol  1,000 Units Oral Daily  . DULoxetine  60 mg Oral Daily  . [START ON 07/10/2020] furosemide  20 mg Oral BID  . ipratropium-albuterol  3 mL Nebulization TID  . levothyroxine  200 mcg Oral Q0600  . metoprolol succinate  12.5 mg Oral QHS  . montelukast  10 mg Oral QHS  . multivitamin with minerals  1 tablet Oral Daily  . pneumococcal 23 valent vaccine  0.5 mL Intramuscular Tomorrow-1000  . traMADol  50 mg Oral Once  . vitamin B-12  500 mcg Oral Daily   Continuous Infusions: .  ceFAZolin (ANCEF) IV 2 g (07/09/20 0519)    Assessment/Plan:   1. Acute delirium, did not sleep last night.  Discontinue steroids.  We will give trazodone at night.  Patient also having some hallucinations. 2. Sepsis, present on admission with left lower extremity cellulitis.  Strep agalactiae growing out of blood cultures.  E. coli growing out of the urine culture.  Patient on Ancef IV and will switch over to Keflex upon disposition.  Patient also had lactic acidosis on presentation peaking at 2.6. 3. Acute on chronic diastolic congestive heart failure.  Hold Lasix today with creatinine creeping up.  Low-dose Toprol at night.  Hopefully will be able to go back on oral Lasix  tomorrow. 4. Chronic lymphedema.  Wound care nurse placed the wraps  today we will have to have home health changed him twice a week. 5. COPD exacerbation.  Continue nebulizer treatments.  Get rid of steroids.  Chest x-ray negative for pneumonia. 6. Chronic atrial fibrillation on Eliquis for anticoagulation Toprol-XL for heart rate control. 7. Hypothyroidism unspecified on levothyroxine 8. Depression.  Continue Cymbalta 9. Chronic kidney disease stage II.  Creatinine creeped up a little bit with diuresis to 1.14 with a GFR of 47.  Holding Lasix today 10. Hypomagnesemia replaced yesterday 11. Weakness.  Continue working with physical therapy       Code Status:     Code Status Orders  (From admission, onward)         Start     Ordered   07/05/20 0821  Do not attempt resuscitation (DNR)  Continuous       Question Answer Comment  In the event of cardiac or respiratory ARREST Do not call a "code blue"   In the event of cardiac or respiratory ARREST Do not perform Intubation, CPR, defibrillation or ACLS   In the event of cardiac or respiratory ARREST Use medication by any route, position, wound care, and other measures to relive pain and suffering. May use oxygen, suction and manual treatment of airway obstruction as needed for comfort.   Comments Code status was discussed with patient and she wants to be a DNR      07/05/20 0824        Code Status History    This patient has a current code status but no historical code status.   Advance Care Planning Activity     Family Communication: Spoke with patient's daughter on the phone Disposition Plan: Status is: Inpatient  Dispo: The patient is from: Home              Anticipated d/c is to: Home with home health              Patient currently with acute delirium today likely from not sleeping last night.   Difficult to place patient.  No.  Consultants:  Infectious disease Antibiotics:  Ancef  Time spent: 28 minutes  Arnold Line

## 2020-07-09 NOTE — Progress Notes (Signed)
Physical Therapy Treatment Patient Details Name: [name] MRN: [id number] DOB: [birth year] Today's Date: 07/09/2020    History of Present Illness [name] is an 14yoF who [facility] on 5/12 from [facility] after continued weakness, fatigue, dizziness, HA after failed ABX for UTI as home. PMH: dCHF, GERD, COPD, HTN, hypoTSH, AF on eliquis, lymphadema.    PT Comments    Pt in bed upon entry, agreeable to PT session. Unfortunately pt denies any AMB in room over the weekend. Supervision to EOB, supervision for STS transfers, pt assists with donning brief for AMB. Pt progresses AMB to 129ft, but is very fatigued at end of walk. Pt performs multiple sets of STS exercise from elevated surface. BLE erythema appears more pronounced this date to author, as well as patient. Pt continues to show progress toward regaining strength, however remains very weak compared to baseline.    Follow Up Recommendations  Home health PT;Supervision for mobility/OOB     Equipment Recommendations  None recommended by PT    Recommendations for Other Services       Precautions / Restrictions Precautions Precautions: Fall Restrictions Weight Bearing Restrictions: No    Mobility  Bed Mobility Overal bed mobility: Needs Assistance Bed Mobility: Supine to Sit     Supine to sit: Supervision;Modified independent (Device/Increase time);HOB elevated          Transfers Overall transfer level: Needs assistance Equipment used: Rolling walker (2 wheeled) Transfers: Sit to/from Stand Sit to Stand: Min guard;Supervision;From elevated surface         General transfer comment: Needs elevated surface to perform sequential STS; pt assisted with depends donning once in standing.  Ambulation/Gait Ambulation/Gait assistance: Min guard Gait Distance (Feet): 150 Feet Assistive device: Rolling walker (2 wheeled) Gait Pattern/deviations: Step-to pattern Gait velocity: 0.3m/s Gait velocity  interpretation: <1.8 ft/sec, indicate of risk for recurrent falls General Gait Details: 2-point pattern, not particularly good at estimating tolerance, be cautious of advancing distance without chair follow. (leg tired prior to return to room)   Stairs             Wheelchair Mobility    Modified Rankin (Stroke Patients Only)       Balance                                            Cognition Arousal/Alertness: Awake/alert Behavior During Therapy: WFL for tasks assessed/performed Overall Cognitive Status: Within Functional Limits for tasks assessed                                        Exercises Other Exercises Other Exercises: 5xSTS from recliner+pillow; (2 sets with rest between)    General Comments        Pertinent Vitals/Pain Pain Assessment: No/denies pain    Home Living                      Prior Function            PT Goals (current goals can now be found in the care plan section) Acute Rehab PT Goals Patient Stated Goal: continue to regain her strength PT Goal Formulation: With patient Time For Goal Achievement: 07/20/20 Potential to Achieve Goals: Good Progress towards PT goals: Progressing toward goals  Frequency    Min 2X/week      PT Plan Current plan remains appropriate    Co-evaluation              AM-PAC PT "6 Clicks" Mobility   Outcome Measure  Help needed turning from your back to your side while in a flat bed without using bedrails?: A Little Help needed moving from lying on your back to sitting on the side of a flat bed without using bedrails?: A Little Help needed moving to and from a bed to a chair (including a wheelchair)?: A Little Help needed standing up from a chair using your arms (e.g., wheelchair or bedside chair)?: A Little Help needed to walk in hospital room?: A Little Help needed climbing 3-5 steps with a railing? : A Lot 6 Click Score: 17    End of Session  Equipment Utilized During Treatment: Gait belt Activity Tolerance: Patient tolerated treatment well;No increased pain;Patient limited by fatigue Patient left: in chair;with call bell/phone within reach;with chair alarm set Nurse Communication: Mobility status PT Visit Diagnosis: Difficulty in walking, not elsewhere classified (R26.2);Other abnormalities of gait and mobility (R26.89);Muscle weakness (generalized) (M62.81)     Time: 5643-3295 PT Time Calculation (min) (ACUTE ONLY): 25 min  Charges:  $Therapeutic Exercise: 23-37 mins                     10:39 AM, 07/09/20 Etta Grandchild, PT, DPT Physical Therapist - [facility]  [phone] ([place])    Faith C 07/09/2020, 10:37 AM

## 2020-07-09 NOTE — Plan of Care (Signed)

## 2020-07-09 NOTE — Progress Notes (Addendum)
Pt is complaining of headache 7/10. Pt tylenol was given at 2110. NP Ouma made aware. Will continue to monitor.  Update 0246: NP Ouma ordered tramadol tablet 50 oral once, but per pt no headache at this time. Will continue to monitor.

## 2020-07-09 NOTE — Consult Note (Signed)
WOC Nurse Consult Note: Reason for Consult: ID MD recommends Unna's boots for LE edema. Patient has dx of lymphadema and this is treated with compression pumps and manual massage in a Lymphadema clinic.  Will need to determine who she will follow up with care.  WOC is not training for lymphadema wraps and manual massage. Patient has compression stockings that she does not wear.      Dressing procedure/placement/frequency: applied bilateral LE unna's boots, will need follow up from [facility] to change Unna's boots 1-2x per week.   Discussed POC with patient and bedside nurse.  Re consult if needed, will not follow at this time. Thanks  Zacory Fiola [employer], RN,CWOCN, CNS, [place] [phone])

## 2020-07-09 NOTE — Care Management Important Message (Signed)
Important Message  Patient Details  Name: [name] MRN: [id number] Date of Birth: 1930/09/04   Medicare Important Message Given:  Yes     Dannette Barbara 07/09/2020, 12:07 PM

## 2020-07-09 NOTE — Progress Notes (Signed)
[facility] CLINIC INFECTIOUS DISEASE PROGRESS NOTE Date of Admission:  07/05/2020     ID: [name] is a 87 y.o. female with  Cellulitis, grp B strep bacteremia  Principal Problem:   Sepsis secondary to UTI [facility]) Active Problems:   Lymphedema   CKD (chronic kidney disease), stage II   CHF (congestive heart failure) (HCC)   COPD with acute exacerbation (HCC)   Hypothyroidism   Hypertension   CKD (chronic kidney disease) stage 3, GFR 30-59 ml/min (HCC)   Sepsis due to group B Streptococcus without acute organ dysfunction (HCC)   Acute on chronic diastolic CHF (congestive heart failure) (HCC)   Atrial fibrillation, chronic (HCC)   Depression   Hypomagnesemia   Subjective: Feels better, no fevers, leg improving  ROS  Eleven systems are reviewed and negative except per hpi  Medications:  Antibiotics Given (last 72 hours)    Date/Time Action Medication Dose Rate   07/06/20 1651 New Bag/Given   cefTRIAXone (ROCEPHIN) 2 g in sodium chloride 0.9 % 100 mL IVPB 2 g 200 mL/hr   07/07/20 1630 New Bag/Given   ceFAZolin (ANCEF) IVPB 2g/100 mL premix 2 g 200 mL/hr   07/07/20 2210 New Bag/Given   ceFAZolin (ANCEF) IVPB 2g/100 mL premix 2 g 200 mL/hr   07/08/20 0616 New Bag/Given   ceFAZolin (ANCEF) IVPB 2g/100 mL premix 2 g 200 mL/hr   07/08/20 1402 New Bag/Given   ceFAZolin (ANCEF) IVPB 2g/100 mL premix 2 g 200 mL/hr   07/08/20 2105 New Bag/Given   ceFAZolin (ANCEF) IVPB 2g/100 mL premix 2 g 200 mL/hr   07/09/20 0519 New Bag/Given   ceFAZolin (ANCEF) IVPB 2g/100 mL premix 2 g 200 mL/hr     . apixaban  5 mg Oral BID  . Chlorhexidine Gluconate Cloth  6 each Topical Daily  . cholecalciferol  1,000 Units Oral Daily  . DULoxetine  60 mg Oral Daily  . [START ON 07/10/2020] furosemide  20 mg Oral BID  . ipratropium-albuterol  3 mL Nebulization TID  . levothyroxine  200 mcg Oral Q0600  . metoprolol succinate  12.5 mg Oral QHS  . montelukast  10 mg Oral QHS  . multivitamin with  minerals  1 tablet Oral Daily  . pneumococcal 23 valent vaccine  0.5 mL Intramuscular Tomorrow-1000  . traMADol  50 mg Oral Once  . vitamin B-12  500 mcg Oral Daily    Objective: Vital signs in last 24 hours: Temp:  [97.8 F (36.6 C)-98.4 F (36.9 C)] 97.9 F (36.6 C) (05/16 1127) Pulse Rate:  [65-117] 72 (05/16 1127) Resp:  [18-20] 20 (05/16 1127) BP: (102-132)/(52-80) 126/73 (05/16 1127) SpO2:  [90 %-100 %] 93 % (05/16 1127) Weight:  [127.2 kg] 127.2 kg (05/16 0405) Constitutional:  oriented to person, place, and time. appears well-developed and well-nourished. No distress.  HENT: [place]/AT, PERRLA, no scleral icterus Mouth/Throat: Oropharynx is clear and moist. No oropharyngeal exudate.  Cardiovascular: Normal rate, regular rhythm and normal heart sounds. Exam reveals no gallop and no friction rub.  Pulmonary/Chest: Effort normal and breath sounds normal. No respiratory distress.  has no wheezes.  Neck = supple, no nuchal rigidity Abdominal: Soft. Bowel sounds are normal.  exhibits no distension. There is no tenderness.  Lymphadenopathy: no cervical adenopathy. No axillary adenopathy Ext 2+ bil le edema to upper shin Neurological: alert and oriented to person, place, and time.  Skin: bil legs in unnaboots Psychiatric: a normal mood and affect.  behavior is normal.  Lab Results Recent  Labs    07/07/20 0455 07/08/20 0450 07/09/20 0350  WBC 10.5  --   --   HGB 10.2*  --   --   HCT 31.2*  --   --   NA 135 138 135  K 4.1 4.1 4.7  CL 104 104 98  CO2 24 25 29   BUN 28* 32* 40*  CREATININE 0.90 1.04* 1.14*    Microbiology: Results for orders placed or performed during the hospital encounter of 07/05/20  Blood culture (routine x 2)     Status: Abnormal   Collection Time: 07/05/20  4:10 AM   Specimen: BLOOD  Result Value Ref Range Status   Specimen Description   Final    BLOOD RIGHT ANTECUBITAL Performed at [facility], [address] [place], [place]  [address]    Special Requests   Final    BOTTLES DRAWN AEROBIC AND ANAEROBIC Blood Culture adequate volume Performed at [facility], [address] [place], [place] [address]    Culture  Setup Time   Final    Organism ID to follow IN BOTH AEROBIC AND ANAEROBIC BOTTLES GRAM POSITIVE COCCI CRITICAL RESULT CALLED TO, READ BACK BY AND VERIFIED WITH: WALLD NAZARI @1544  ON 07/05/20.[place] Performed at [facility], [address] [place], [place] [address]    Culture GROUP B STREP(S.AGALACTIAE)ISOLATED (A)  Final   Report Status 07/07/2020 FINAL  Final   Organism ID, Bacteria GROUP B STREP(S.AGALACTIAE)ISOLATED  Final      Susceptibility   Group b strep(s.agalactiae)isolated - MIC*    CLINDAMYCIN >=1 RESISTANT Resistant     AMPICILLIN <=0.25 SENSITIVE Sensitive     ERYTHROMYCIN >=8 RESISTANT Resistant     VANCOMYCIN 0.5 SENSITIVE Sensitive     CEFTRIAXONE <=0.12 SENSITIVE Sensitive     LEVOFLOXACIN 0.5 SENSITIVE Sensitive     PENICILLIN Value in next row Sensitive      SENSITIVEMIC =0.06    * GROUP B STREP(S.AGALACTIAE)ISOLATED  Blood Culture ID Panel (Reflexed)     Status: Abnormal   Collection Time: 07/05/20  4:10 AM  Result Value Ref Range Status   Enterococcus faecalis NOT DETECTED NOT DETECTED Final   Enterococcus Faecium NOT DETECTED NOT DETECTED Final   Listeria monocytogenes NOT DETECTED NOT DETECTED Final   Staphylococcus species NOT DETECTED NOT DETECTED Final   Staphylococcus aureus (BCID) NOT DETECTED NOT DETECTED Final   Staphylococcus epidermidis NOT DETECTED NOT DETECTED Final   Staphylococcus lugdunensis NOT DETECTED NOT DETECTED Final   Streptococcus species DETECTED (A) NOT DETECTED Final    Comment: CRITICAL RESULT CALLED TO, READ BACK BY AND VERIFIED WITH: WALLD NAZARI @1544  ON 07/05/20.[place]    Streptococcus agalactiae DETECTED (A) NOT DETECTED Final    Comment: CRITICAL RESULT CALLED TO, READ BACK BY AND VERIFIED WITH: WALLD NAZARI @1544  ON  07/05/20.[place]    Streptococcus pneumoniae NOT DETECTED NOT DETECTED Final   Streptococcus pyogenes NOT DETECTED NOT DETECTED Final   A.calcoaceticus-baumannii NOT DETECTED NOT DETECTED Final   Bacteroides fragilis NOT DETECTED NOT DETECTED Final   Enterobacterales NOT DETECTED NOT DETECTED Final   Enterobacter cloacae complex NOT DETECTED NOT DETECTED Final   Escherichia coli NOT DETECTED NOT DETECTED Final   Klebsiella aerogenes NOT DETECTED NOT DETECTED Final   Klebsiella oxytoca NOT DETECTED NOT DETECTED Final   Klebsiella pneumoniae NOT DETECTED NOT DETECTED Final   Proteus species NOT DETECTED NOT DETECTED Final   Salmonella species NOT DETECTED NOT DETECTED Final   Serratia marcescens NOT DETECTED NOT DETECTED  Final   Haemophilus influenzae NOT DETECTED NOT DETECTED Final   Neisseria meningitidis NOT DETECTED NOT DETECTED Final   Pseudomonas aeruginosa NOT DETECTED NOT DETECTED Final   Stenotrophomonas maltophilia NOT DETECTED NOT DETECTED Final   Candida albicans NOT DETECTED NOT DETECTED Final   Candida auris NOT DETECTED NOT DETECTED Final   Candida glabrata NOT DETECTED NOT DETECTED Final   Candida krusei NOT DETECTED NOT DETECTED Final   Candida parapsilosis NOT DETECTED NOT DETECTED Final   Candida tropicalis NOT DETECTED NOT DETECTED Final   Cryptococcus neoformans/gattii NOT DETECTED NOT DETECTED Final    Comment: Performed at [facility], [address] [place], [place] [address]  Blood culture (routine x 2)     Status: Abnormal   Collection Time: 07/05/20  4:16 AM   Specimen: BLOOD  Result Value Ref Range Status   Specimen Description   Final    BLOOD LEFT ANTECUBITAL Performed at [facility], [address] [place], [place] [address]    Special Requests   Final    BOTTLES DRAWN AEROBIC AND ANAEROBIC Blood Culture adequate volume Performed at [facility], [address] [place], [place] [address]    Culture  Setup Time   Final     GRAM POSITIVE COCCI IN BOTH AEROBIC AND ANAEROBIC BOTTLES CRITICAL VALUE NOTED.  VALUE IS CONSISTENT WITH PREVIOUSLY REPORTED AND CALLED VALUE.    Culture (A)  Final    GROUP B STREP(S.AGALACTIAE)ISOLATED SUSCEPTIBILITIES PERFORMED ON PREVIOUS CULTURE WITHIN THE LAST 5 DAYS. Performed at [facility], [place] [address] [place], [place] [address]    Report Status 07/07/2020 FINAL  Final  Resp Panel by RT-PCR (Flu A&B, Covid) Nasopharyngeal Swab     Status: None   Collection Time: 07/05/20  4:18 AM   Specimen: Nasopharyngeal Swab; Nasopharyngeal(NP) swabs in vial transport medium  Result Value Ref Range Status   SARS Coronavirus 2 by RT PCR NEGATIVE NEGATIVE Final    Comment: (NOTE) SARS-CoV-2 target nucleic acids are NOT DETECTED.  The SARS-CoV-2 RNA is generally detectable in upper respiratory specimens during the acute phase of infection. The lowest concentration of SARS-CoV-2 viral copies this assay can detect is 138 copies/mL. A negative result does not preclude SARS-Cov-2 infection and should not be used as the sole basis for treatment or other patient management decisions. A negative result may occur with  improper specimen collection/handling, submission of specimen other than nasopharyngeal swab, presence of viral mutation(s) within the areas targeted by this assay, and inadequate number of viral copies(<138 copies/mL). A negative result must be combined with clinical observations, patient history, and epidemiological information. The expected result is Negative.  Fact Sheet for Patients:  EntrepreneurPulse.com.au  Fact Sheet for Healthcare Providers:  IncredibleEmployment.be  This test is no t yet approved or cleared by the [place] FDA and  has been authorized for detection and/or diagnosis of SARS-CoV-2 by FDA under an Emergency Use Authorization (EUA). This EUA will remain  in effect (meaning this test can be used) for  the duration of the COVID-19 declaration under Section 564(b)(1) of the Act, 21 U.S.C.section 360bbb-3(b)(1), unless the authorization is terminated  or revoked sooner.       Influenza A by PCR NEGATIVE NEGATIVE Final   Influenza B by PCR NEGATIVE NEGATIVE Final    Comment: (NOTE) The Xpert Xpress SARS-CoV-2/FLU/RSV plus assay is intended as an aid in the diagnosis of influenza from Nasopharyngeal swab specimens and should not be used as a sole  basis for treatment. Nasal washings and aspirates are unacceptable for Xpert Xpress SARS-CoV-2/FLU/RSV testing.  Fact Sheet for Patients: EntrepreneurPulse.com.au  Fact Sheet for Healthcare Providers: IncredibleEmployment.be  This test is not yet approved or cleared by the [place] FDA and has been authorized for detection and/or diagnosis of SARS-CoV-2 by FDA under an Emergency Use Authorization (EUA). This EUA will remain in effect (meaning this test can be used) for the duration of the COVID-19 declaration under Section 564(b)(1) of the Act, 21 U.S.C. section 360bbb-3(b)(1), unless the authorization is terminated or revoked.  Performed at [facility], [address] [place], [place] [address]   Urine culture     Status: Abnormal   Collection Time: 07/05/20  5:24 AM   Specimen: Urine, Random  Result Value Ref Range Status   Specimen Description   Final    URINE, RANDOM Performed at [facility], [address] [place], [place] [address]    Special Requests   Final    NONE Performed at [facility], [place], [place] [address]    Culture >=100,000 COLONIES/mL ESCHERICHIA COLI (A)  Final   Report Status 07/07/2020 FINAL  Final   Organism ID, Bacteria ESCHERICHIA COLI (A)  Final      Susceptibility   Escherichia coli - MIC*    AMPICILLIN >=32 RESISTANT Resistant     CEFAZOLIN <=4 SENSITIVE Sensitive     CEFEPIME <=0.12 SENSITIVE Sensitive      CEFTRIAXONE <=0.25 SENSITIVE Sensitive     CIPROFLOXACIN <=0.25 SENSITIVE Sensitive     GENTAMICIN <=1 SENSITIVE Sensitive     IMIPENEM <=0.25 SENSITIVE Sensitive     NITROFURANTOIN <=16 SENSITIVE Sensitive     TRIMETH/SULFA <=20 SENSITIVE Sensitive     AMPICILLIN/SULBACTAM >=32 RESISTANT Resistant     PIP/TAZO <=4 SENSITIVE Sensitive     * >=100,000 COLONIES/mL ESCHERICHIA COLI    Studies/Results: DG Chest Port 1 View  Result Date: 07/09/2020 CLINICAL DATA:  Shortness of breath with productive cough. EXAM: PORTABLE CHEST 1 VIEW COMPARISON:  07/05/2020; 10/03/2015 FINDINGS: Grossly unchanged enlarged cardiac silhouette and mediastinal contours with tortuosity and potential ectasia the thoracic aorta. There is persistent rightward deviation of the tracheal air column at the level of the thoracic inlet as well as deviation of the cardiomediastinal structures into the right hemithorax, unchanged. There is unchanged mild diffuse slightly nodular thickening of the pulmonary interstitium. Minimal perihilar and medial basilar opacities, unchanged. No new focal airspace opacities. No pleural effusion or pneumothorax. No evidence of edema. No acute osseous abnormalities. Degenerative change the right glenohumeral and acromioclavicular joints, incompletely evaluated. IMPRESSION: Similar findings of cardiomegaly and chronic bronchitic change without superimposed acute cardiopulmonary disease. Electronically Signed   By: Sandi Mariscal M.D.   On: 07/09/2020 08:36    Assessment/Plan: [name] is a 87 y.o. female with a history of multiple medical problems including diastolic CHF, COPD, hypertension, A. fib and chronic lymphedema.  She is now admitted with fevers and chills and is found to have group B strep bacteremia as well as an E. coli urinary tract infection.  She was recently treated with cefuroxime for outpatient E. coli UTI.  On admission she was also found to have mild cellulitis on her left  lower extremity. She does have a history of chronic lymphedema and had been seen by vascular in the past, had used in the wraps and has a lymphedema pump at home.  She will be at continued risk for recurrent cellulitis  is not bacteremia and will need to work as an outpatient on lymphedema  5/16- no fevers, wbc down to 10.  Day 6 abx. FU BCX neg.E coli non UCX R to amp and augmentin.   TTE neg veg  Recommendations Cont cefazolin Can dc when stable on keflex 500 mg qid for a total 14 day course of treatment. At the time of discharge I would also suggest bilateral unna wraps. With home health  to change her Unna wraps twice weekly. I can see her in follow-up after several weeks of the wraps.  Thank you very much for the consult. Will follow with you.  Leonel Ramsay   07/09/2020, 2:32 PM

## 2020-07-10 ENCOUNTER — Ambulatory Visit: Payer: Medicare HMO | Attending: Nephrology

## 2020-07-10 LAB — CBC
HCT: 33 % — ABNORMAL LOW (ref 36.0–46.0)
Hemoglobin: 10.9 g/dL — ABNORMAL LOW (ref 12.0–15.0)
MCH: 29 pg (ref 26.0–34.0)
MCHC: 33 g/dL (ref 30.0–36.0)
MCV: 87.8 fL (ref 80.0–100.0)
Platelets: 260 10*3/uL (ref 150–400)
RBC: 3.76 MIL/uL — ABNORMAL LOW (ref 3.87–5.11)
RDW: 13.2 % (ref 11.5–15.5)
WBC: 10.7 10*3/uL — ABNORMAL HIGH (ref 4.0–10.5)
nRBC: 0 % (ref 0.0–0.2)

## 2020-07-10 LAB — BASIC METABOLIC PANEL
Anion gap: 7 (ref 5–15)
BUN: 42 mg/dL — ABNORMAL HIGH (ref 8–23)
CO2: 29 mmol/L (ref 22–32)
Calcium: 10.6 mg/dL — ABNORMAL HIGH (ref 8.9–10.3)
Chloride: 100 mmol/L (ref 98–111)
Creatinine, Ser: 0.88 mg/dL (ref 0.44–1.00)
GFR, Estimated: >60 mL/min (ref >60)
Glucose, Bld: 112 mg/dL — ABNORMAL HIGH (ref 70–99)
Potassium: 4.3 mmol/L (ref 3.5–5.1)
Sodium: 136 mmol/L (ref 135–145)

## 2020-07-10 MED ORDER — METOPROLOL SUCCINATE ER 25 MG PO TB24: 12.5000 mg | ORAL_TABLET | Freq: Every day | ORAL | 0 refills | Status: DC

## 2020-07-10 MED ORDER — FUROSEMIDE 20 MG PO TABS: 20.0000 mg | ORAL_TABLET | Freq: Two times a day (BID) | ORAL | 0 refills | Status: DC

## 2020-07-10 MED ORDER — IPRATROPIUM-ALBUTEROL 0.5-2.5 (3) MG/3ML IN SOLN: 3.0000 mL | Freq: Four times a day (QID) | RESPIRATORY_TRACT | 0 refills | Status: DC | PRN

## 2020-07-10 MED ORDER — TRAZODONE HCL 50 MG PO TABS: 50.0000 mg | ORAL_TABLET | Freq: Every evening | ORAL | 0 refills | Status: DC | PRN

## 2020-07-10 MED ORDER — CEPHALEXIN 500 MG PO CAPS: 500.0000 mg | ORAL_CAPSULE | Freq: Three times a day (TID) | ORAL | 0 refills | Status: AC

## 2020-07-10 NOTE — Progress Notes (Signed)
Patient discharged to home. Tele and IV d/c'd.  Verbalizes understanding of discharge instructions. 

## 2020-07-10 NOTE — Discharge Summary (Signed)
[facility] NAME: [name]    MR#:  [id number]  DATE OF BIRTH:  11-25-1930  DATE OF ADMISSION:  07/05/2020 ADMITTING PHYSICIAN: Athena Masse, MD  DATE OF DISCHARGE: 07/10/2020  2:40 PM  PRIMARY CARE PHYSICIAN: Ezequiel Kayser, MD    ADMISSION DIAGNOSIS:  Hypomagnesemia [E83.42] Atrial fibrillation with RVR ([place]) [I48.91] Sepsis secondary to UTI ([place]) [A41.9, N39.0] Sepsis, due to unspecified organism, unspecified whether acute organ dysfunction present ([place]) [A41.9]  DISCHARGE DIAGNOSIS:  Principal Problem:   Sepsis secondary to UTI [facility]) Active Problems:   Lymphedema   CKD (chronic kidney disease), stage II   CHF (congestive heart failure) ([place])   COPD with acute exacerbation (HCC)   Hypothyroidism   Hypertension   CKD (chronic kidney disease) stage 3, GFR 30-59 ml/min (HCC)   Sepsis due to group B Streptococcus without acute organ dysfunction (HCC)   Acute on chronic diastolic CHF (congestive heart failure) (HCC)   Atrial fibrillation, chronic (HCC)   Depression   Hypomagnesemia   Acute delirium   SECONDARY DIAGNOSIS:   Past Medical History:  Diagnosis Date  . Arthritis   . Asthma   . CHF (congestive heart failure) ([place])   . COPD (chronic obstructive pulmonary disease) ([place])   . Cough   . GERD (gastroesophageal reflux disease)   . Gout   . Hiatal hernia   . Hypertension   . Hypothyroidism   . Neuropathic pain of right lower extremity   . Peripheral vascular disease ([place])    "poor circulation"  . Seasonal allergies   . Shortness of breath dyspnea   . Sleep apnea    uses CPAP (sometimes)    HOSPITAL COURSE:   1.  Sepsis, present on admission with left lower extremity cellulitis.  Streptococcus agalactiae growing out of the blood cultures.  E. coli growing out of the urine culture.  The patient was initially on Rocephin and switched to penicillin G and then switch back to Rocephin and then switched to  Ancef.  Patient will be discharged on Keflex to complete a total of 14 days of antibiotics. 2.  Acute delirium in the hospital.  This has resolved.  The patient did not have hallucinations prior to coming into the hospital and had some hallucinations here in the hospital.  These were visual hallucinations.  I did give trazodone last night.  I will make this as needed at home.  I think getting back into her familiar environment will help out. 3.  Acute on chronic diastolic congestive heart failure with fluid overload.  Low-dose Toprol-XL at night.  Patient was diuresed with IV Lasix twice daily and then switched over to oral Lasix 20 mg twice a day. 4.  Chronic lymphedema.  Wound care nurse placed Unna boots.  Will have home health change twice a week. 5.  COPD exacerbation.  Continue nebulizer treatments.  I did give 1 dose of steroids but then had acute delirium after that so I discontinued.  Chest x-ray negative for pneumonia. 6.  Chronic atrial fibrillation on Eliquis for anticoagulation and Toprol-XL for heart rate control 7.  Hypothyroidism unspecified on levothyroxine 8.  Depression on Cymbalta 9.  Chronic kidney disease stage II with IV diuresis the patient's creatinine went up to 1.14.  Creatinine upon discharge 0.88. 10.  Patient's calcium is slightly elevated at 10.6 with diuresis.  Recommend rechecking as outpatient. 11.  Hypomagnesemia replaced during the hospital course.  DISCHARGE CONDITIONS:  Satisfactory  CONSULTS OBTAINED:  Infectious disease  DRUG ALLERGIES:   Allergies  Allergen Reactions  . Aspirin Nausea Only  . Prednisone     Other reaction(s): Other (See Comments), Unknown  . Sulfa Antibiotics Rash  . Sulfacetamide Sodium Rash    DISCHARGE MEDICATIONS:   Allergies as of 07/10/2020      Reactions   Aspirin Nausea Only   Prednisone    Other reaction(s): Other (See Comments), Unknown   Sulfa Antibiotics Rash   Sulfacetamide Sodium Rash      Medication List     STOP taking these medications   losartan 50 MG tablet Commonly known as: COZAAR   spironolactone 25 MG tablet Commonly known as: ALDACTONE   triamcinolone ointment 0.1 % Commonly known as: KENALOG     TAKE these medications   acetaminophen 500 MG tablet Commonly known as: TYLENOL Take 500-1,000 mg by mouth every 6 (six) hours as needed for mild pain or moderate pain.   apixaban 5 MG Tabs tablet Commonly known as: ELIQUIS Take 5 mg by mouth 2 (two) times daily.   cephALEXin 500 MG capsule Commonly known as: KEFLEX Take 1 capsule (500 mg total) by mouth 3 (three) times daily for 9 days.   DULoxetine 60 MG capsule Commonly known as: CYMBALTA Take 60 mg by mouth daily.   furosemide 20 MG tablet Commonly known as: LASIX Take 1 tablet (20 mg total) by mouth 2 (two) times daily. What changed: when to take this   ipratropium-albuterol 0.5-2.5 (3) MG/3ML Soln Commonly known as: DUONEB Take 3 mLs by nebulization every 6 (six) hours as needed (shortness of breath).   levothyroxine 200 MCG tablet Commonly known as: SYNTHROID Take 200 mcg by mouth daily.   metoprolol succinate 25 MG 24 hr tablet Commonly known as: TOPROL-XL Take 0.5 tablets (12.5 mg total) by mouth at bedtime.   montelukast 10 MG tablet Commonly known as: SINGULAIR Take 10 mg by mouth daily.   PreserVision AREDS 2+Multi Vit Caps Take 1 capsule by mouth as directed.   traZODone 50 MG tablet Commonly known as: DESYREL Take 1 tablet (50 mg total) by mouth at bedtime as needed for sleep.   Ventolin HFA 108 (90 Base) MCG/ACT inhaler Generic drug: albuterol Inhale 2 puffs into the lungs every 6 (six) hours as needed for wheezing or shortness of breath.   vitamin B-12 500 MCG tablet Commonly known as: CYANOCOBALAMIN Take 500 mcg by mouth daily.        DISCHARGE INSTRUCTIONS:   Follow-up with PMD 5 days Home health set up  If you experience worsening of your admission symptoms, develop  shortness of breath, life threatening emergency, suicidal or homicidal thoughts you must seek medical attention immediately by calling 911 or calling your MD immediately  if symptoms less severe.  You Must read complete instructions/literature along with all the possible adverse reactions/side effects for all the Medicines you take and that have been prescribed to you. Take any new Medicines after you have completely understood and accept all the possible adverse reactions/side effects.   Please note  You were cared for by a hospitalist during your hospital stay. If you have any questions about your discharge medications or the care you received while you were in the hospital after you are discharged, you can call the unit and asked to speak with the hospitalist on call if the hospitalist that took care of you is not available. Once you are discharged, your primary care physician will handle  any further medical issues. Please note that NO REFILLS for any discharge medications will be authorized once you are discharged, as it is imperative that you return to your primary care physician (or establish a relationship with a primary care physician if you do not have one) for your aftercare needs so that they can reassess your need for medications and monitor your lab values.    Today   CHIEF COMPLAINT:   Chief Complaint  Patient presents with  . Dizziness  . Weakness    HISTORY OF PRESENT ILLNESS:  [name]  is a 87 y.o. female with a known history of came in with dizziness and weakness and found to have sepsis.   VITAL SIGNS:  Blood pressure (!) 142/64, pulse 68, temperature 97.9 F (36.6 C), temperature source Oral, resp. rate 18, height 5\' 4"  (1.626 m), weight 126.5 kg, SpO2 94 %.  I/O:    Intake/Output Summary (Last 24 hours) at 07/10/2020 2028 Last data filed at 07/10/2020 1340 Gross per 24 hour  Intake 720 ml  Output 1700 ml  Net -980 ml    PHYSICAL EXAMINATION:  GENERAL:  87  y.o.-year-old patient lying in the bed with no acute distress.  EYES: Pupils equal, round, reactive to light and accommodation. No scleral icterus.  HEENT: Head atraumatic, normocephalic. Oropharynx and nasopharynx clear.  LUNGS: Normal breath sounds bilaterally, no wheezing, rales,rhonchi or crepitation. No use of accessory muscles of respiration.  CARDIOVASCULAR: S1, S2 irregular irregular. No murmurs, rubs, or gallops.  ABDOMEN: Soft, non-tender, non-distended. Bowel sounds present. No organomegaly or mass.  EXTREMITIES: 2+ pedal edema.no cyanosis, or clubbing.  NEUROLOGIC: Cranial nerves II through XII are intact. Sensation intact. Gait not checked.  PSYCHIATRIC: The patient is alert and oriented x 3.  SKIN: No obvious rash, lesion, or ulcer.   DATA REVIEW:   CBC Recent Labs  Lab 07/10/20 0444  WBC 10.7*  HGB 10.9*  HCT 33.0*  PLT 260    Chemistries  Recent Labs  Lab 07/05/20 0416 07/06/20 0420 07/09/20 0350 07/10/20 0444  NA 134*   < > 135 136  K 3.9   < > 4.7 4.3  CL 101   < > 98 100  CO2 25   < > 29 29  GLUCOSE 102*   < > 128* 112*  BUN 33*   < > 40* 42*  CREATININE 1.14*   < > 1.14* 0.88  CALCIUM 10.6*   < > 10.5* 10.6*  MG 1.5*   < > 1.8  --   AST 27  --   --   --   ALT 18  --   --   --   ALKPHOS 66  --   --   --   BILITOT 1.1  --   --   --    < > = values in this interval not displayed.    Microbiology Results  Results for orders placed or performed during the hospital encounter of 07/05/20  Blood culture (routine x 2)     Status: Abnormal   Collection Time: 07/05/20  4:10 AM   Specimen: BLOOD  Result Value Ref Range Status   Specimen Description   Final    BLOOD RIGHT ANTECUBITAL Performed at [facility], [address], [place], [place] [address]    Special Requests   Final    BOTTLES DRAWN AEROBIC AND ANAEROBIC Blood Culture adequate volume Performed at [facility], [address] [place], [place] [address]  Culture   Setup Time   Final    Organism ID to follow IN BOTH AEROBIC AND ANAEROBIC BOTTLES GRAM POSITIVE COCCI CRITICAL RESULT CALLED TO, READ BACK BY AND VERIFIED WITH: WALLD NAZARI @1544  ON 07/05/20.[place] Performed at [facility], [address] [place], [place] [address]    Culture GROUP B STREP(S.AGALACTIAE)ISOLATED (A)  Final   Report Status 07/07/2020 FINAL  Final   Organism ID, Bacteria GROUP B STREP(S.AGALACTIAE)ISOLATED  Final      Susceptibility   Group b strep(s.agalactiae)isolated - MIC*    CLINDAMYCIN >=1 RESISTANT Resistant     AMPICILLIN <=0.25 SENSITIVE Sensitive     ERYTHROMYCIN >=8 RESISTANT Resistant     VANCOMYCIN 0.5 SENSITIVE Sensitive     CEFTRIAXONE <=0.12 SENSITIVE Sensitive     LEVOFLOXACIN 0.5 SENSITIVE Sensitive     PENICILLIN Value in next row Sensitive      SENSITIVEMIC =0.06    * GROUP B STREP(S.AGALACTIAE)ISOLATED  Blood Culture ID Panel (Reflexed)     Status: Abnormal   Collection Time: 07/05/20  4:10 AM  Result Value Ref Range Status   Enterococcus faecalis NOT DETECTED NOT DETECTED Final   Enterococcus Faecium NOT DETECTED NOT DETECTED Final   Listeria monocytogenes NOT DETECTED NOT DETECTED Final   Staphylococcus species NOT DETECTED NOT DETECTED Final   Staphylococcus aureus (BCID) NOT DETECTED NOT DETECTED Final   Staphylococcus epidermidis NOT DETECTED NOT DETECTED Final   Staphylococcus lugdunensis NOT DETECTED NOT DETECTED Final   Streptococcus species DETECTED (A) NOT DETECTED Final    Comment: CRITICAL RESULT CALLED TO, READ BACK BY AND VERIFIED WITH: WALLD NAZARI @1544  ON 07/05/20.[place]    Streptococcus agalactiae DETECTED (A) NOT DETECTED Final    Comment: CRITICAL RESULT CALLED TO, READ BACK BY AND VERIFIED WITH: WALLD NAZARI @1544  ON 07/05/20.SH    Streptococcus pneumoniae NOT DETECTED NOT DETECTED Final   Streptococcus pyogenes NOT DETECTED NOT DETECTED Final   A.calcoaceticus-baumannii NOT DETECTED NOT DETECTED Final    Bacteroides fragilis NOT DETECTED NOT DETECTED Final   Enterobacterales NOT DETECTED NOT DETECTED Final   Enterobacter cloacae complex NOT DETECTED NOT DETECTED Final   Escherichia coli NOT DETECTED NOT DETECTED Final   Klebsiella aerogenes NOT DETECTED NOT DETECTED Final   Klebsiella oxytoca NOT DETECTED NOT DETECTED Final   Klebsiella pneumoniae NOT DETECTED NOT DETECTED Final   Proteus species NOT DETECTED NOT DETECTED Final   Salmonella species NOT DETECTED NOT DETECTED Final   Serratia marcescens NOT DETECTED NOT DETECTED Final   Haemophilus influenzae NOT DETECTED NOT DETECTED Final   Neisseria meningitidis NOT DETECTED NOT DETECTED Final   Pseudomonas aeruginosa NOT DETECTED NOT DETECTED Final   Stenotrophomonas maltophilia NOT DETECTED NOT DETECTED Final   Candida albicans NOT DETECTED NOT DETECTED Final   Candida auris NOT DETECTED NOT DETECTED Final   Candida glabrata NOT DETECTED NOT DETECTED Final   Candida krusei NOT DETECTED NOT DETECTED Final   Candida parapsilosis NOT DETECTED NOT DETECTED Final   Candida tropicalis NOT DETECTED NOT DETECTED Final   Cryptococcus neoformans/gattii NOT DETECTED NOT DETECTED Final    Comment: Performed at [facility], [address] [place], [place] [address]  Blood culture (routine x 2)     Status: Abnormal   Collection Time: 07/05/20  4:16 AM   Specimen: BLOOD  Result Value Ref Range Status   Specimen Description   Final    BLOOD LEFT ANTECUBITAL Performed at [facility], [address] [place], [place] [address]  Special Requests   Final    BOTTLES DRAWN AEROBIC AND ANAEROBIC Blood Culture adequate volume Performed at [facility], [address] [place], [place] [address]    Culture  Setup Time   Final    GRAM POSITIVE COCCI IN BOTH AEROBIC AND ANAEROBIC BOTTLES CRITICAL VALUE NOTED.  VALUE IS CONSISTENT WITH PREVIOUSLY REPORTED AND CALLED VALUE.    Culture (A)  Final    GROUP B  STREP(S.AGALACTIAE)ISOLATED SUSCEPTIBILITIES PERFORMED ON PREVIOUS CULTURE WITHIN THE LAST 5 DAYS. Performed at [facility], [place] [address], [place], [place] [address]    Report Status 07/07/2020 FINAL  Final  Resp Panel by RT-PCR (Flu A&B, Covid) Nasopharyngeal Swab     Status: None   Collection Time: 07/05/20  4:18 AM   Specimen: Nasopharyngeal Swab; Nasopharyngeal(NP) swabs in vial transport medium  Result Value Ref Range Status   SARS Coronavirus 2 by RT PCR NEGATIVE NEGATIVE Final    Comment: (NOTE) SARS-CoV-2 target nucleic acids are NOT DETECTED.  The SARS-CoV-2 RNA is generally detectable in upper respiratory specimens during the acute phase of infection. The lowest concentration of SARS-CoV-2 viral copies this assay can detect is 138 copies/mL. A negative result does not preclude SARS-Cov-2 infection and should not be used as the sole basis for treatment or other patient management decisions. A negative result may occur with  improper specimen collection/handling, submission of specimen other than nasopharyngeal swab, presence of viral mutation(s) within the areas targeted by this assay, and inadequate number of viral copies(<138 copies/mL). A negative result must be combined with clinical observations, patient history, and epidemiological information. The expected result is Negative.  Fact Sheet for Patients:  EntrepreneurPulse.com.au  Fact Sheet for Healthcare Providers:  IncredibleEmployment.be  This test is no t yet approved or cleared by the [place] FDA and  has been authorized for detection and/or diagnosis of SARS-CoV-2 by FDA under an Emergency Use Authorization (EUA). This EUA will remain  in effect (meaning this test can be used) for the duration of the COVID-19 declaration under Section 564(b)(1) of the Act, 21 U.S.C.section 360bbb-3(b)(1), unless the authorization is terminated  or revoked sooner.        Influenza A by PCR NEGATIVE NEGATIVE Final   Influenza B by PCR NEGATIVE NEGATIVE Final    Comment: (NOTE) The Xpert Xpress SARS-CoV-2/FLU/RSV plus assay is intended as an aid in the diagnosis of influenza from Nasopharyngeal swab specimens and should not be used as a sole basis for treatment. Nasal washings and aspirates are unacceptable for Xpert Xpress SARS-CoV-2/FLU/RSV testing.  Fact Sheet for Patients: EntrepreneurPulse.com.au  Fact Sheet for Healthcare Providers: IncredibleEmployment.be  This test is not yet approved or cleared by the [place] FDA and has been authorized for detection and/or diagnosis of SARS-CoV-2 by FDA under an Emergency Use Authorization (EUA). This EUA will remain in effect (meaning this test can be used) for the duration of the COVID-19 declaration under Section 564(b)(1) of the Act, 21 U.S.C. section 360bbb-3(b)(1), unless the authorization is terminated or revoked.  Performed at [facility], [address], [place], [place] [address]   Urine culture     Status: Abnormal   Collection Time: 07/05/20  5:24 AM   Specimen: Urine, Random  Result Value Ref Range Status   Specimen Description   Final    URINE, RANDOM Performed at [facility], [address] [place], [place] [address]    Special Requests   Final    NONE Performed  at [facility], [address] [place], [place] [address]    Culture >=100,000 COLONIES/mL ESCHERICHIA COLI (A)  Final   Report Status 07/07/2020 FINAL  Final   Organism ID, Bacteria ESCHERICHIA COLI (A)  Final      Susceptibility   Escherichia coli - MIC*    AMPICILLIN >=32 RESISTANT Resistant     CEFAZOLIN <=4 SENSITIVE Sensitive     CEFEPIME <=0.12 SENSITIVE Sensitive     CEFTRIAXONE <=0.25 SENSITIVE Sensitive     CIPROFLOXACIN <=0.25 SENSITIVE Sensitive     GENTAMICIN <=1 SENSITIVE Sensitive     IMIPENEM <=0.25 SENSITIVE Sensitive      NITROFURANTOIN <=16 SENSITIVE Sensitive     TRIMETH/SULFA <=20 SENSITIVE Sensitive     AMPICILLIN/SULBACTAM >=32 RESISTANT Resistant     PIP/TAZO <=4 SENSITIVE Sensitive     * >=100,000 COLONIES/mL ESCHERICHIA COLI  Culture, blood (single) w Reflex to ID Panel     Status: None (Preliminary result)   Collection Time: 07/09/20  3:57 PM   Specimen: BLOOD  Result Value Ref Range Status   Specimen Description BLOOD BLOOD LEFT HAND  Final   Special Requests   Final    BOTTLES DRAWN AEROBIC AND ANAEROBIC Blood Culture adequate volume   Culture   Final    NO GROWTH < 24 HOURS Performed at [facility], [address] [place], [place] [address]    Report Status PENDING  Incomplete    RADIOLOGY:  DG Chest Port 1 View  Result Date: 07/09/2020 CLINICAL DATA:  Shortness of breath with productive cough. EXAM: PORTABLE CHEST 1 VIEW COMPARISON:  07/05/2020; 10/03/2015 FINDINGS: Grossly unchanged enlarged cardiac silhouette and mediastinal contours with tortuosity and potential ectasia the thoracic aorta. There is persistent rightward deviation of the tracheal air column at the level of the thoracic inlet as well as deviation of the cardiomediastinal structures into the right hemithorax, unchanged. There is unchanged mild diffuse slightly nodular thickening of the pulmonary interstitium. Minimal perihilar and medial basilar opacities, unchanged. No new focal airspace opacities. No pleural effusion or pneumothorax. No evidence of edema. No acute osseous abnormalities. Degenerative change the right glenohumeral and acromioclavicular joints, incompletely evaluated. IMPRESSION: Similar findings of cardiomegaly and chronic bronchitic change without superimposed acute cardiopulmonary disease. Electronically Signed   By: Sandi Mariscal M.D.   On: 07/09/2020 08:36     Management plans discussed with the patient, family and they are in agreement.  CODE STATUS:  Code Status History    Date Active Date  Inactive Code Status Order ID Comments User Context   07/05/2020 0824 07/10/2020 1947 DNR [id number]  Collier Bullock, MD ED   Advance Care Planning Activity    Questions for Most Recent Historical Code Status (Order [id number])    Question Answer   In the event of cardiac or respiratory ARREST Do not call a "code blue"   In the event of cardiac or respiratory ARREST Do not perform Intubation, CPR, defibrillation or ACLS   In the event of cardiac or respiratory ARREST Use medication by any route, position, wound care, and other measures to relive pain and suffering. May use oxygen, suction and manual treatment of airway obstruction as needed for comfort.   Comments Code status was discussed with patient and she wants to be a DNR      TOTAL TIME TAKING CARE OF THIS PATIENT: 35 minutes.    Loletha Grayer M.D on 07/10/2020 at 8:28 PM  Between 7am to 6pm - Pager - [phone]  After 6pm go to [email or web address] - password [email or web address]  [facility]  CC: Primary care physician; Ezequiel Kayser, MD

## 2020-07-10 NOTE — TOC Transition Note (Addendum)
Transition of Care [facility]) - CM/SW Discharge Note   Patient Details  Name: [name] MRN: [id number] Date of Birth: [birth year]  [facility] of [facility] CM/SW Contact:  Alberteen Sam, LCSW Phone Number: 07/10/2020, 9:45 AM   Clinical Narrative:     Update: [facility] cannot accommodate nursing, reached out to [facility] with [facility]mings they are able to accept patient for home health services and accommodate wound care needs.     Patient is set up with [facility] home health for PT, OT, RN and social work. CSW has informed [name] with [facility] of wound care needs per [place] nurse note, indicating Unna boots be changed 1-2x a week. Agency informed of patient's discharge today.   No other discharge needs identified at this time.     Final next level of care: [place] Barriers to Discharge: No Barriers Identified   Patient Goals and CMS Choice Patient states their goals for this hospitalization and ongoing recovery are:: to go home CMS Medicare.gov Compare Post Acute Care list provided to:: Patient Choice offered to / list presented to : Patient  Discharge Placement                       Discharge Plan and Services In-house Referral: Clinical Social Work Discharge Planning Services: CM Consult Post Acute Care Choice: [place]: PT,OT,RN,Social Work [place]: [facility] [place] Date [place] Agency Contacted: 07/10/20 Time [place]: 0945 Representative spoke with at [place]: [place] ([place]) Interventions     Readmission Risk Interventions No flowsheet data found.

## 2020-07-13 DIAGNOSIS — N390 Urinary tract infection, site not specified: Secondary | ICD-10-CM | POA: Diagnosis not present

## 2020-07-13 DIAGNOSIS — L97929 Non-pressure chronic ulcer of unspecified part of left lower leg with unspecified severity: Secondary | ICD-10-CM | POA: Diagnosis not present

## 2020-07-13 DIAGNOSIS — M1030 Gout due to renal impairment, unspecified site: Secondary | ICD-10-CM | POA: Diagnosis not present

## 2020-07-13 DIAGNOSIS — L97919 Non-pressure chronic ulcer of unspecified part of right lower leg with unspecified severity: Secondary | ICD-10-CM | POA: Diagnosis not present

## 2020-07-13 DIAGNOSIS — J449 Chronic obstructive pulmonary disease, unspecified: Secondary | ICD-10-CM | POA: Diagnosis not present

## 2020-07-13 DIAGNOSIS — N183 Chronic kidney disease, stage 3 unspecified: Secondary | ICD-10-CM | POA: Diagnosis not present

## 2020-07-13 DIAGNOSIS — A4151 Sepsis due to Escherichia coli [E. coli]: Secondary | ICD-10-CM | POA: Diagnosis not present

## 2020-07-13 DIAGNOSIS — I5032 Chronic diastolic (congestive) heart failure: Secondary | ICD-10-CM | POA: Diagnosis not present

## 2020-07-13 DIAGNOSIS — I130 Hypertensive heart and chronic kidney disease with heart failure and stage 1 through stage 4 chronic kidney disease, or unspecified chronic kidney disease: Secondary | ICD-10-CM | POA: Diagnosis not present

## 2020-07-13 DIAGNOSIS — Z8619 Personal history of other infectious and parasitic diseases: Secondary | ICD-10-CM | POA: Diagnosis not present

## 2020-07-13 DIAGNOSIS — I482 Chronic atrial fibrillation, unspecified: Secondary | ICD-10-CM | POA: Diagnosis not present

## 2020-07-13 DIAGNOSIS — I739 Peripheral vascular disease, unspecified: Secondary | ICD-10-CM | POA: Diagnosis not present

## 2020-07-13 DIAGNOSIS — Z87440 Personal history of urinary (tract) infections: Secondary | ICD-10-CM | POA: Diagnosis not present

## 2020-07-14 LAB — CULTURE, BLOOD (SINGLE)
Culture: NO GROWTH
Special Requests: ADEQUATE

## 2020-07-16 DIAGNOSIS — R600 Localized edema: Secondary | ICD-10-CM | POA: Diagnosis not present

## 2020-07-16 DIAGNOSIS — M1030 Gout due to renal impairment, unspecified site: Secondary | ICD-10-CM | POA: Diagnosis not present

## 2020-07-16 DIAGNOSIS — Z09 Encounter for follow-up examination after completed treatment for conditions other than malignant neoplasm: Secondary | ICD-10-CM | POA: Diagnosis not present

## 2020-07-16 DIAGNOSIS — J449 Chronic obstructive pulmonary disease, unspecified: Secondary | ICD-10-CM | POA: Diagnosis not present

## 2020-07-16 DIAGNOSIS — I130 Hypertensive heart and chronic kidney disease with heart failure and stage 1 through stage 4 chronic kidney disease, or unspecified chronic kidney disease: Secondary | ICD-10-CM | POA: Diagnosis not present

## 2020-07-16 DIAGNOSIS — I739 Peripheral vascular disease, unspecified: Secondary | ICD-10-CM | POA: Diagnosis not present

## 2020-07-16 DIAGNOSIS — J439 Emphysema, unspecified: Secondary | ICD-10-CM | POA: Diagnosis not present

## 2020-07-16 DIAGNOSIS — I4811 Longstanding persistent atrial fibrillation: Secondary | ICD-10-CM | POA: Diagnosis not present

## 2020-07-16 DIAGNOSIS — N183 Chronic kidney disease, stage 3 unspecified: Secondary | ICD-10-CM | POA: Diagnosis not present

## 2020-07-16 DIAGNOSIS — A4151 Sepsis due to Escherichia coli [E. coli]: Secondary | ICD-10-CM | POA: Diagnosis not present

## 2020-07-16 DIAGNOSIS — I5032 Chronic diastolic (congestive) heart failure: Secondary | ICD-10-CM | POA: Diagnosis not present

## 2020-07-16 DIAGNOSIS — I482 Chronic atrial fibrillation, unspecified: Secondary | ICD-10-CM | POA: Diagnosis not present

## 2020-07-16 DIAGNOSIS — I872 Venous insufficiency (chronic) (peripheral): Secondary | ICD-10-CM | POA: Diagnosis not present

## 2020-07-16 DIAGNOSIS — N390 Urinary tract infection, site not specified: Secondary | ICD-10-CM | POA: Diagnosis not present

## 2020-07-16 DIAGNOSIS — Z79899 Other long term (current) drug therapy: Secondary | ICD-10-CM | POA: Diagnosis not present

## 2020-07-16 DIAGNOSIS — Z7189 Other specified counseling: Secondary | ICD-10-CM | POA: Diagnosis not present

## 2020-07-17 DIAGNOSIS — I739 Peripheral vascular disease, unspecified: Secondary | ICD-10-CM | POA: Diagnosis not present

## 2020-07-17 DIAGNOSIS — J449 Chronic obstructive pulmonary disease, unspecified: Secondary | ICD-10-CM | POA: Diagnosis not present

## 2020-07-17 DIAGNOSIS — N390 Urinary tract infection, site not specified: Secondary | ICD-10-CM | POA: Diagnosis not present

## 2020-07-17 DIAGNOSIS — A4151 Sepsis due to Escherichia coli [E. coli]: Secondary | ICD-10-CM | POA: Diagnosis not present

## 2020-07-17 DIAGNOSIS — I5032 Chronic diastolic (congestive) heart failure: Secondary | ICD-10-CM | POA: Diagnosis not present

## 2020-07-17 DIAGNOSIS — I482 Chronic atrial fibrillation, unspecified: Secondary | ICD-10-CM | POA: Diagnosis not present

## 2020-07-17 DIAGNOSIS — I130 Hypertensive heart and chronic kidney disease with heart failure and stage 1 through stage 4 chronic kidney disease, or unspecified chronic kidney disease: Secondary | ICD-10-CM | POA: Diagnosis not present

## 2020-07-17 DIAGNOSIS — M1030 Gout due to renal impairment, unspecified site: Secondary | ICD-10-CM | POA: Diagnosis not present

## 2020-07-17 DIAGNOSIS — N183 Chronic kidney disease, stage 3 unspecified: Secondary | ICD-10-CM | POA: Diagnosis not present

## 2020-07-18 DIAGNOSIS — A4151 Sepsis due to Escherichia coli [E. coli]: Secondary | ICD-10-CM | POA: Diagnosis not present

## 2020-07-18 DIAGNOSIS — M1030 Gout due to renal impairment, unspecified site: Secondary | ICD-10-CM | POA: Diagnosis not present

## 2020-07-18 DIAGNOSIS — N390 Urinary tract infection, site not specified: Secondary | ICD-10-CM | POA: Diagnosis not present

## 2020-07-18 DIAGNOSIS — I482 Chronic atrial fibrillation, unspecified: Secondary | ICD-10-CM | POA: Diagnosis not present

## 2020-07-18 DIAGNOSIS — I130 Hypertensive heart and chronic kidney disease with heart failure and stage 1 through stage 4 chronic kidney disease, or unspecified chronic kidney disease: Secondary | ICD-10-CM | POA: Diagnosis not present

## 2020-07-18 DIAGNOSIS — J449 Chronic obstructive pulmonary disease, unspecified: Secondary | ICD-10-CM | POA: Diagnosis not present

## 2020-07-18 DIAGNOSIS — I739 Peripheral vascular disease, unspecified: Secondary | ICD-10-CM | POA: Diagnosis not present

## 2020-07-18 DIAGNOSIS — I5032 Chronic diastolic (congestive) heart failure: Secondary | ICD-10-CM | POA: Diagnosis not present

## 2020-07-18 DIAGNOSIS — N183 Chronic kidney disease, stage 3 unspecified: Secondary | ICD-10-CM | POA: Diagnosis not present

## 2020-07-19 DIAGNOSIS — M1030 Gout due to renal impairment, unspecified site: Secondary | ICD-10-CM | POA: Diagnosis not present

## 2020-07-19 DIAGNOSIS — I482 Chronic atrial fibrillation, unspecified: Secondary | ICD-10-CM | POA: Diagnosis not present

## 2020-07-19 DIAGNOSIS — J449 Chronic obstructive pulmonary disease, unspecified: Secondary | ICD-10-CM | POA: Diagnosis not present

## 2020-07-19 DIAGNOSIS — N183 Chronic kidney disease, stage 3 unspecified: Secondary | ICD-10-CM | POA: Diagnosis not present

## 2020-07-19 DIAGNOSIS — A4151 Sepsis due to Escherichia coli [E. coli]: Secondary | ICD-10-CM | POA: Diagnosis not present

## 2020-07-19 DIAGNOSIS — N390 Urinary tract infection, site not specified: Secondary | ICD-10-CM | POA: Diagnosis not present

## 2020-07-19 DIAGNOSIS — I739 Peripheral vascular disease, unspecified: Secondary | ICD-10-CM | POA: Diagnosis not present

## 2020-07-19 DIAGNOSIS — I5032 Chronic diastolic (congestive) heart failure: Secondary | ICD-10-CM | POA: Diagnosis not present

## 2020-07-19 DIAGNOSIS — I130 Hypertensive heart and chronic kidney disease with heart failure and stage 1 through stage 4 chronic kidney disease, or unspecified chronic kidney disease: Secondary | ICD-10-CM | POA: Diagnosis not present

## 2020-07-20 DIAGNOSIS — J449 Chronic obstructive pulmonary disease, unspecified: Secondary | ICD-10-CM | POA: Diagnosis not present

## 2020-07-20 DIAGNOSIS — I130 Hypertensive heart and chronic kidney disease with heart failure and stage 1 through stage 4 chronic kidney disease, or unspecified chronic kidney disease: Secondary | ICD-10-CM | POA: Diagnosis not present

## 2020-07-20 DIAGNOSIS — A4151 Sepsis due to Escherichia coli [E. coli]: Secondary | ICD-10-CM | POA: Diagnosis not present

## 2020-07-20 DIAGNOSIS — I5032 Chronic diastolic (congestive) heart failure: Secondary | ICD-10-CM | POA: Diagnosis not present

## 2020-07-20 DIAGNOSIS — N183 Chronic kidney disease, stage 3 unspecified: Secondary | ICD-10-CM | POA: Diagnosis not present

## 2020-07-20 DIAGNOSIS — M1030 Gout due to renal impairment, unspecified site: Secondary | ICD-10-CM | POA: Diagnosis not present

## 2020-07-20 DIAGNOSIS — N390 Urinary tract infection, site not specified: Secondary | ICD-10-CM | POA: Diagnosis not present

## 2020-07-20 DIAGNOSIS — I739 Peripheral vascular disease, unspecified: Secondary | ICD-10-CM | POA: Diagnosis not present

## 2020-07-20 DIAGNOSIS — I482 Chronic atrial fibrillation, unspecified: Secondary | ICD-10-CM | POA: Diagnosis not present

## 2020-07-24 DIAGNOSIS — I739 Peripheral vascular disease, unspecified: Secondary | ICD-10-CM | POA: Diagnosis not present

## 2020-07-24 DIAGNOSIS — A4151 Sepsis due to Escherichia coli [E. coli]: Secondary | ICD-10-CM | POA: Diagnosis not present

## 2020-07-24 DIAGNOSIS — I130 Hypertensive heart and chronic kidney disease with heart failure and stage 1 through stage 4 chronic kidney disease, or unspecified chronic kidney disease: Secondary | ICD-10-CM | POA: Diagnosis not present

## 2020-07-24 DIAGNOSIS — M1030 Gout due to renal impairment, unspecified site: Secondary | ICD-10-CM | POA: Diagnosis not present

## 2020-07-24 DIAGNOSIS — N390 Urinary tract infection, site not specified: Secondary | ICD-10-CM | POA: Diagnosis not present

## 2020-07-24 DIAGNOSIS — N183 Chronic kidney disease, stage 3 unspecified: Secondary | ICD-10-CM | POA: Diagnosis not present

## 2020-07-24 DIAGNOSIS — I5032 Chronic diastolic (congestive) heart failure: Secondary | ICD-10-CM | POA: Diagnosis not present

## 2020-07-24 DIAGNOSIS — J449 Chronic obstructive pulmonary disease, unspecified: Secondary | ICD-10-CM | POA: Diagnosis not present

## 2020-07-24 DIAGNOSIS — I482 Chronic atrial fibrillation, unspecified: Secondary | ICD-10-CM | POA: Diagnosis not present

## 2020-07-26 DIAGNOSIS — I739 Peripheral vascular disease, unspecified: Secondary | ICD-10-CM | POA: Diagnosis not present

## 2020-07-26 DIAGNOSIS — I130 Hypertensive heart and chronic kidney disease with heart failure and stage 1 through stage 4 chronic kidney disease, or unspecified chronic kidney disease: Secondary | ICD-10-CM | POA: Diagnosis not present

## 2020-07-26 DIAGNOSIS — N183 Chronic kidney disease, stage 3 unspecified: Secondary | ICD-10-CM | POA: Diagnosis not present

## 2020-07-26 DIAGNOSIS — A4151 Sepsis due to Escherichia coli [E. coli]: Secondary | ICD-10-CM | POA: Diagnosis not present

## 2020-07-26 DIAGNOSIS — N390 Urinary tract infection, site not specified: Secondary | ICD-10-CM | POA: Diagnosis not present

## 2020-07-26 DIAGNOSIS — I5032 Chronic diastolic (congestive) heart failure: Secondary | ICD-10-CM | POA: Diagnosis not present

## 2020-07-26 DIAGNOSIS — M1030 Gout due to renal impairment, unspecified site: Secondary | ICD-10-CM | POA: Diagnosis not present

## 2020-07-26 DIAGNOSIS — J449 Chronic obstructive pulmonary disease, unspecified: Secondary | ICD-10-CM | POA: Diagnosis not present

## 2020-07-26 DIAGNOSIS — I482 Chronic atrial fibrillation, unspecified: Secondary | ICD-10-CM | POA: Diagnosis not present

## 2020-07-27 DIAGNOSIS — I739 Peripheral vascular disease, unspecified: Secondary | ICD-10-CM | POA: Diagnosis not present

## 2020-07-27 DIAGNOSIS — M1030 Gout due to renal impairment, unspecified site: Secondary | ICD-10-CM | POA: Diagnosis not present

## 2020-07-27 DIAGNOSIS — I130 Hypertensive heart and chronic kidney disease with heart failure and stage 1 through stage 4 chronic kidney disease, or unspecified chronic kidney disease: Secondary | ICD-10-CM | POA: Diagnosis not present

## 2020-07-27 DIAGNOSIS — A4151 Sepsis due to Escherichia coli [E. coli]: Secondary | ICD-10-CM | POA: Diagnosis not present

## 2020-07-27 DIAGNOSIS — I5032 Chronic diastolic (congestive) heart failure: Secondary | ICD-10-CM | POA: Diagnosis not present

## 2020-07-27 DIAGNOSIS — I482 Chronic atrial fibrillation, unspecified: Secondary | ICD-10-CM | POA: Diagnosis not present

## 2020-07-27 DIAGNOSIS — J449 Chronic obstructive pulmonary disease, unspecified: Secondary | ICD-10-CM | POA: Diagnosis not present

## 2020-07-27 DIAGNOSIS — N183 Chronic kidney disease, stage 3 unspecified: Secondary | ICD-10-CM | POA: Diagnosis not present

## 2020-07-27 DIAGNOSIS — N390 Urinary tract infection, site not specified: Secondary | ICD-10-CM | POA: Diagnosis not present

## 2020-07-30 DIAGNOSIS — M1030 Gout due to renal impairment, unspecified site: Secondary | ICD-10-CM | POA: Diagnosis not present

## 2020-07-30 DIAGNOSIS — I890 Lymphedema, not elsewhere classified: Secondary | ICD-10-CM | POA: Diagnosis not present

## 2020-07-30 DIAGNOSIS — I739 Peripheral vascular disease, unspecified: Secondary | ICD-10-CM | POA: Diagnosis not present

## 2020-07-30 DIAGNOSIS — I5032 Chronic diastolic (congestive) heart failure: Secondary | ICD-10-CM | POA: Diagnosis not present

## 2020-07-30 DIAGNOSIS — A4151 Sepsis due to Escherichia coli [E. coli]: Secondary | ICD-10-CM | POA: Diagnosis not present

## 2020-07-30 DIAGNOSIS — Z6841 Body Mass Index (BMI) 40.0 and over, adult: Secondary | ICD-10-CM | POA: Diagnosis not present

## 2020-07-30 DIAGNOSIS — B951 Streptococcus, group B, as the cause of diseases classified elsewhere: Secondary | ICD-10-CM | POA: Diagnosis not present

## 2020-07-30 DIAGNOSIS — R7881 Bacteremia: Secondary | ICD-10-CM | POA: Diagnosis not present

## 2020-07-30 DIAGNOSIS — N390 Urinary tract infection, site not specified: Secondary | ICD-10-CM | POA: Diagnosis not present

## 2020-07-30 DIAGNOSIS — J449 Chronic obstructive pulmonary disease, unspecified: Secondary | ICD-10-CM | POA: Diagnosis not present

## 2020-07-30 DIAGNOSIS — I482 Chronic atrial fibrillation, unspecified: Secondary | ICD-10-CM | POA: Diagnosis not present

## 2020-07-30 DIAGNOSIS — I130 Hypertensive heart and chronic kidney disease with heart failure and stage 1 through stage 4 chronic kidney disease, or unspecified chronic kidney disease: Secondary | ICD-10-CM | POA: Diagnosis not present

## 2020-07-30 DIAGNOSIS — N183 Chronic kidney disease, stage 3 unspecified: Secondary | ICD-10-CM | POA: Diagnosis not present

## 2020-07-30 DIAGNOSIS — L03116 Cellulitis of left lower limb: Secondary | ICD-10-CM | POA: Diagnosis not present

## 2020-07-30 DIAGNOSIS — E669 Obesity, unspecified: Secondary | ICD-10-CM | POA: Diagnosis not present

## 2020-08-01 DIAGNOSIS — N390 Urinary tract infection, site not specified: Secondary | ICD-10-CM | POA: Diagnosis not present

## 2020-08-01 DIAGNOSIS — M1030 Gout due to renal impairment, unspecified site: Secondary | ICD-10-CM | POA: Diagnosis not present

## 2020-08-01 DIAGNOSIS — I130 Hypertensive heart and chronic kidney disease with heart failure and stage 1 through stage 4 chronic kidney disease, or unspecified chronic kidney disease: Secondary | ICD-10-CM | POA: Diagnosis not present

## 2020-08-01 DIAGNOSIS — J449 Chronic obstructive pulmonary disease, unspecified: Secondary | ICD-10-CM | POA: Diagnosis not present

## 2020-08-01 DIAGNOSIS — I739 Peripheral vascular disease, unspecified: Secondary | ICD-10-CM | POA: Diagnosis not present

## 2020-08-01 DIAGNOSIS — I482 Chronic atrial fibrillation, unspecified: Secondary | ICD-10-CM | POA: Diagnosis not present

## 2020-08-01 DIAGNOSIS — A4151 Sepsis due to Escherichia coli [E. coli]: Secondary | ICD-10-CM | POA: Diagnosis not present

## 2020-08-01 DIAGNOSIS — I5032 Chronic diastolic (congestive) heart failure: Secondary | ICD-10-CM | POA: Diagnosis not present

## 2020-08-01 DIAGNOSIS — N183 Chronic kidney disease, stage 3 unspecified: Secondary | ICD-10-CM | POA: Diagnosis not present

## 2020-08-02 DIAGNOSIS — N390 Urinary tract infection, site not specified: Secondary | ICD-10-CM | POA: Diagnosis not present

## 2020-08-02 DIAGNOSIS — J449 Chronic obstructive pulmonary disease, unspecified: Secondary | ICD-10-CM | POA: Diagnosis not present

## 2020-08-02 DIAGNOSIS — A4151 Sepsis due to Escherichia coli [E. coli]: Secondary | ICD-10-CM | POA: Diagnosis not present

## 2020-08-02 DIAGNOSIS — I482 Chronic atrial fibrillation, unspecified: Secondary | ICD-10-CM | POA: Diagnosis not present

## 2020-08-02 DIAGNOSIS — I739 Peripheral vascular disease, unspecified: Secondary | ICD-10-CM | POA: Diagnosis not present

## 2020-08-02 DIAGNOSIS — I130 Hypertensive heart and chronic kidney disease with heart failure and stage 1 through stage 4 chronic kidney disease, or unspecified chronic kidney disease: Secondary | ICD-10-CM | POA: Diagnosis not present

## 2020-08-02 DIAGNOSIS — N183 Chronic kidney disease, stage 3 unspecified: Secondary | ICD-10-CM | POA: Diagnosis not present

## 2020-08-02 DIAGNOSIS — I5032 Chronic diastolic (congestive) heart failure: Secondary | ICD-10-CM | POA: Diagnosis not present

## 2020-08-02 DIAGNOSIS — M1030 Gout due to renal impairment, unspecified site: Secondary | ICD-10-CM | POA: Diagnosis not present

## 2020-08-03 DIAGNOSIS — M1030 Gout due to renal impairment, unspecified site: Secondary | ICD-10-CM | POA: Diagnosis not present

## 2020-08-03 DIAGNOSIS — J449 Chronic obstructive pulmonary disease, unspecified: Secondary | ICD-10-CM | POA: Diagnosis not present

## 2020-08-03 DIAGNOSIS — N183 Chronic kidney disease, stage 3 unspecified: Secondary | ICD-10-CM | POA: Diagnosis not present

## 2020-08-03 DIAGNOSIS — N390 Urinary tract infection, site not specified: Secondary | ICD-10-CM | POA: Diagnosis not present

## 2020-08-03 DIAGNOSIS — I130 Hypertensive heart and chronic kidney disease with heart failure and stage 1 through stage 4 chronic kidney disease, or unspecified chronic kidney disease: Secondary | ICD-10-CM | POA: Diagnosis not present

## 2020-08-03 DIAGNOSIS — I482 Chronic atrial fibrillation, unspecified: Secondary | ICD-10-CM | POA: Diagnosis not present

## 2020-08-03 DIAGNOSIS — A4151 Sepsis due to Escherichia coli [E. coli]: Secondary | ICD-10-CM | POA: Diagnosis not present

## 2020-08-03 DIAGNOSIS — I5032 Chronic diastolic (congestive) heart failure: Secondary | ICD-10-CM | POA: Diagnosis not present

## 2020-08-03 DIAGNOSIS — I739 Peripheral vascular disease, unspecified: Secondary | ICD-10-CM | POA: Diagnosis not present

## 2020-08-06 DIAGNOSIS — M1030 Gout due to renal impairment, unspecified site: Secondary | ICD-10-CM | POA: Diagnosis not present

## 2020-08-06 DIAGNOSIS — I482 Chronic atrial fibrillation, unspecified: Secondary | ICD-10-CM | POA: Diagnosis not present

## 2020-08-06 DIAGNOSIS — J449 Chronic obstructive pulmonary disease, unspecified: Secondary | ICD-10-CM | POA: Diagnosis not present

## 2020-08-06 DIAGNOSIS — I739 Peripheral vascular disease, unspecified: Secondary | ICD-10-CM | POA: Diagnosis not present

## 2020-08-06 DIAGNOSIS — I130 Hypertensive heart and chronic kidney disease with heart failure and stage 1 through stage 4 chronic kidney disease, or unspecified chronic kidney disease: Secondary | ICD-10-CM | POA: Diagnosis not present

## 2020-08-06 DIAGNOSIS — N183 Chronic kidney disease, stage 3 unspecified: Secondary | ICD-10-CM | POA: Diagnosis not present

## 2020-08-06 DIAGNOSIS — I5032 Chronic diastolic (congestive) heart failure: Secondary | ICD-10-CM | POA: Diagnosis not present

## 2020-08-06 DIAGNOSIS — A4151 Sepsis due to Escherichia coli [E. coli]: Secondary | ICD-10-CM | POA: Diagnosis not present

## 2020-08-06 DIAGNOSIS — N390 Urinary tract infection, site not specified: Secondary | ICD-10-CM | POA: Diagnosis not present

## 2020-08-07 DIAGNOSIS — N183 Chronic kidney disease, stage 3 unspecified: Secondary | ICD-10-CM | POA: Diagnosis not present

## 2020-08-07 DIAGNOSIS — I130 Hypertensive heart and chronic kidney disease with heart failure and stage 1 through stage 4 chronic kidney disease, or unspecified chronic kidney disease: Secondary | ICD-10-CM | POA: Diagnosis not present

## 2020-08-07 DIAGNOSIS — M1030 Gout due to renal impairment, unspecified site: Secondary | ICD-10-CM | POA: Diagnosis not present

## 2020-08-07 DIAGNOSIS — N390 Urinary tract infection, site not specified: Secondary | ICD-10-CM | POA: Diagnosis not present

## 2020-08-07 DIAGNOSIS — I482 Chronic atrial fibrillation, unspecified: Secondary | ICD-10-CM | POA: Diagnosis not present

## 2020-08-07 DIAGNOSIS — I5032 Chronic diastolic (congestive) heart failure: Secondary | ICD-10-CM | POA: Diagnosis not present

## 2020-08-07 DIAGNOSIS — A4151 Sepsis due to Escherichia coli [E. coli]: Secondary | ICD-10-CM | POA: Diagnosis not present

## 2020-08-07 DIAGNOSIS — I739 Peripheral vascular disease, unspecified: Secondary | ICD-10-CM | POA: Diagnosis not present

## 2020-08-07 DIAGNOSIS — J449 Chronic obstructive pulmonary disease, unspecified: Secondary | ICD-10-CM | POA: Diagnosis not present

## 2020-08-08 DIAGNOSIS — M1030 Gout due to renal impairment, unspecified site: Secondary | ICD-10-CM | POA: Diagnosis not present

## 2020-08-08 DIAGNOSIS — J449 Chronic obstructive pulmonary disease, unspecified: Secondary | ICD-10-CM | POA: Diagnosis not present

## 2020-08-08 DIAGNOSIS — I739 Peripheral vascular disease, unspecified: Secondary | ICD-10-CM | POA: Diagnosis not present

## 2020-08-08 DIAGNOSIS — N183 Chronic kidney disease, stage 3 unspecified: Secondary | ICD-10-CM | POA: Diagnosis not present

## 2020-08-08 DIAGNOSIS — I5032 Chronic diastolic (congestive) heart failure: Secondary | ICD-10-CM | POA: Diagnosis not present

## 2020-08-08 DIAGNOSIS — N390 Urinary tract infection, site not specified: Secondary | ICD-10-CM | POA: Diagnosis not present

## 2020-08-08 DIAGNOSIS — I130 Hypertensive heart and chronic kidney disease with heart failure and stage 1 through stage 4 chronic kidney disease, or unspecified chronic kidney disease: Secondary | ICD-10-CM | POA: Diagnosis not present

## 2020-08-08 DIAGNOSIS — A4151 Sepsis due to Escherichia coli [E. coli]: Secondary | ICD-10-CM | POA: Diagnosis not present

## 2020-08-08 DIAGNOSIS — I482 Chronic atrial fibrillation, unspecified: Secondary | ICD-10-CM | POA: Diagnosis not present

## 2020-08-10 ENCOUNTER — Other Ambulatory Visit: Payer: Self-pay

## 2020-08-10 ENCOUNTER — Ambulatory Visit (INDEPENDENT_AMBULATORY_CARE_PROVIDER_SITE_OTHER): Payer: Medicare HMO | Admitting: Nurse Practitioner

## 2020-08-10 ENCOUNTER — Telehealth (INDEPENDENT_AMBULATORY_CARE_PROVIDER_SITE_OTHER): Payer: Self-pay | Admitting: Nurse Practitioner

## 2020-08-10 VITALS — BP 127/71 | HR 80 | Resp 16 | Ht 64.0 in | Wt 281.0 lb

## 2020-08-10 DIAGNOSIS — I890 Lymphedema, not elsewhere classified: Secondary | ICD-10-CM | POA: Diagnosis not present

## 2020-08-10 DIAGNOSIS — J309 Allergic rhinitis, unspecified: Secondary | ICD-10-CM | POA: Insufficient documentation

## 2020-08-10 DIAGNOSIS — G629 Polyneuropathy, unspecified: Secondary | ICD-10-CM | POA: Insufficient documentation

## 2020-08-10 DIAGNOSIS — I10 Essential (primary) hypertension: Secondary | ICD-10-CM | POA: Diagnosis not present

## 2020-08-10 DIAGNOSIS — I509 Heart failure, unspecified: Secondary | ICD-10-CM | POA: Diagnosis not present

## 2020-08-10 NOTE — Telephone Encounter (Signed)
Patient was seen in clinic today by FB. FB wanted to make [place] aware that patient will call on Wed 08/15/20 after seeing infectious disease doctor, if at the appt unna boot is cut off [place] desk is to bring patient in to be rewrapped (same day, if possible)  **We are also working on getting patient HomeHealth  This note is for documentation purposes only

## 2020-08-11 ENCOUNTER — Encounter (INDEPENDENT_AMBULATORY_CARE_PROVIDER_SITE_OTHER): Payer: Self-pay | Admitting: Nurse Practitioner

## 2020-08-11 DIAGNOSIS — A4151 Sepsis due to Escherichia coli [E. coli]: Secondary | ICD-10-CM | POA: Diagnosis not present

## 2020-08-11 DIAGNOSIS — I482 Chronic atrial fibrillation, unspecified: Secondary | ICD-10-CM | POA: Diagnosis not present

## 2020-08-11 DIAGNOSIS — I130 Hypertensive heart and chronic kidney disease with heart failure and stage 1 through stage 4 chronic kidney disease, or unspecified chronic kidney disease: Secondary | ICD-10-CM | POA: Diagnosis not present

## 2020-08-11 DIAGNOSIS — N390 Urinary tract infection, site not specified: Secondary | ICD-10-CM | POA: Diagnosis not present

## 2020-08-11 DIAGNOSIS — M1030 Gout due to renal impairment, unspecified site: Secondary | ICD-10-CM | POA: Diagnosis not present

## 2020-08-11 DIAGNOSIS — I5032 Chronic diastolic (congestive) heart failure: Secondary | ICD-10-CM | POA: Diagnosis not present

## 2020-08-11 DIAGNOSIS — J449 Chronic obstructive pulmonary disease, unspecified: Secondary | ICD-10-CM | POA: Diagnosis not present

## 2020-08-11 DIAGNOSIS — N183 Chronic kidney disease, stage 3 unspecified: Secondary | ICD-10-CM | POA: Diagnosis not present

## 2020-08-11 DIAGNOSIS — I739 Peripheral vascular disease, unspecified: Secondary | ICD-10-CM | POA: Diagnosis not present

## 2020-08-11 NOTE — Progress Notes (Addendum)
Subjective:    Patient ID: [name], female    DOB: 11-Oct-1930, 87 y.o.   MRN: [id number] Chief Complaint  Patient presents with   New Patient (Initial Visit)    Consult lymphema    [name] is an 87 year old woman who presents today with worsening of her lymphedema.  The patient was recently admitted to the hospital in May due to UTI but she also had noted cellulitis during that time as well.  Since that time the patient's leg swelling has gotten worse and she developed multiple blisters and ulcerations bilaterally, with weeping.  The patient does admit that she does not wear compression as she is not able to find any that fit.  The patient does have a lymphedema pump that she admits to utilizing regularly however this pump she notes does not fit properly on one of her legs and it is greater than about 85 years old.   She tries to elevate when possible.  She currently denies any fevers or chills and is still on Keflex.   Review of Systems  Cardiovascular:  Positive for leg swelling.  Skin:  Positive for wound.      Objective:   Physical Exam Vitals reviewed.  HENT:     Head: Normocephalic.  Cardiovascular:     Rate and Rhythm: Normal rate.  Pulmonary:     Effort: Pulmonary effort is normal.  Musculoskeletal:     Right lower leg: 2+ Pitting Edema present.     Left lower leg: 2+ Pitting Edema present.  Skin:    General: Skin is moist.     Findings: Wound present.  Neurological:     Mental Status: She is alert and oriented to person, place, and time.  Psychiatric:        Mood and Affect: Mood normal.        Behavior: Behavior normal.        Thought Content: Thought content normal.        Judgment: Judgment normal.    BP 127/71 (BP Location: Right Arm)   Pulse 80   Resp 16   Ht 5\' 4"  (1.626 m)   Wt 281 lb (127.5 kg)   BMI 48.23 kg/m   Past Medical History:  Diagnosis Date   Arthritis    Asthma    CHF (congestive heart failure) (HCC)    COPD (chronic  obstructive pulmonary disease) (HCC)    Cough    GERD (gastroesophageal reflux disease)    Gout    Hiatal hernia    Hypertension    Hypothyroidism    Neuropathic pain of right lower extremity    Peripheral vascular disease ([place])    "poor circulation"   Seasonal allergies    Shortness of breath dyspnea    Sleep apnea    uses CPAP (sometimes)    Social History   Socioeconomic History   Marital status: Widowed    Spouse name: Not on file   Number of children: Not on file   Years of education: Not on file   Highest education level: Not on file  Occupational History   Not on file  Tobacco Use   Smoking status: Passive Smoke Exposure - Never Smoker   Smokeless tobacco: Never  Substance and Sexual Activity   Alcohol use: No   Drug use: No   Sexual activity: Not on file  Other Topics Concern   Not on file  Social History Narrative   Not on file   Social  Determinants of Health   Financial Resource Strain: Not on file  Food Insecurity: Not on file  Transportation Needs: Not on file  Physical Activity: Not on file  Stress: Not on file  Social Connections: Not on file  Intimate Partner Violence: Not on file    Past Surgical History:  Procedure Laterality Date   ANKLE ARTHROSCOPY     BACK SURGERY     L4-L5 Decompression   CARDIAC CATHETERIZATION  1995   CATARACT EXTRACTION W/PHACO Right 07/19/2014   Procedure: CATARACT EXTRACTION PHACO AND INTRAOCULAR LENS PLACEMENT ([place]);  Surgeon: Leandrew Koyanagi, MD;  Location: [facility];  Service: Ophthalmology;  Laterality: Right;   CATARACT EXTRACTION W/PHACO Left 08/23/2014   Procedure: CATARACT EXTRACTION PHACO AND INTRAOCULAR LENS PLACEMENT ([facility]);  Surgeon: Leandrew Koyanagi, MD;  Location: [facility];  Service: Ophthalmology;  Laterality: Left;   CHOLECYSTECTOMY     COLONOSCOPY     PARATHYROIDECTOMY  2003   TONSILLECTOMY     UVULOPALATOPHARYNGOPLASTY      Family History  Problem Relation Age of  Onset   Cancer Mother    Hyperlipidemia Son    Diabetes Son    Cancer Maternal Grandmother     Allergies  Allergen Reactions   Aspirin Nausea Only   Prednisone     Other reaction(s): Other (See Comments), Unknown   Sulfa Antibiotics Rash   Sulfacetamide Sodium Rash    CBC Latest Ref Rng & Units 07/10/2020 07/07/2020 07/06/2020  WBC 4.0 - 10.5 K/uL 10.7(H) 10.5 17.0(H)  Hemoglobin 12.0 - 15.0 g/dL 10.9(L) 10.2(L) 9.8(L)  Hematocrit 36.0 - 46.0 % 33.0(L) 31.2(L) 29.5(L)  Platelets 150 - 400 K/uL 260 178 173      CMP     Component Value Date/Time   NA 136 07/10/2020 0444   NA 139 01/19/2013 0313   K 4.3 07/10/2020 0444   K 3.6 01/19/2013 0313   CL 100 07/10/2020 0444   CL 107 01/19/2013 0313   CO2 29 07/10/2020 0444   CO2 28 01/19/2013 0313   GLUCOSE 112 (H) 07/10/2020 0444   GLUCOSE 107 (H) 01/19/2013 0313   BUN 42 (H) 07/10/2020 0444   BUN 20 (H) 01/19/2013 0313   CREATININE 0.88 07/10/2020 0444   CREATININE 1.00 01/19/2013 0313   CALCIUM 10.6 (H) 07/10/2020 0444   CALCIUM 9.8 01/19/2013 0313   PROT 7.0 07/05/2020 0416   PROT 7.0 01/19/2013 0313   ALBUMIN 3.8 07/05/2020 0416   ALBUMIN 3.3 (L) 01/19/2013 0313   AST 27 07/05/2020 0416   AST 18 01/19/2013 0313   ALT 18 07/05/2020 0416   ALT 13 01/19/2013 0313   ALKPHOS 66 07/05/2020 0416   ALKPHOS 76 01/19/2013 0313   BILITOT 1.1 07/05/2020 0416   BILITOT 0.2 01/19/2013 0313   GFRNONAA >60 07/10/2020 0444   GFRNONAA 53 (L) 01/19/2013 0313   GFRAA 56 (L) 09/08/2019 1626   GFRAA >60 01/19/2013 0313     No results found.     Assessment & Plan:   1. Lymphedema of both lower extremities No surgery or intervention at this point in time.    I have had a long discussion with the patient regarding venous insufficiency and why it  causes symptoms, specifically venous ulceration . I have discussed with the patient the chronic skin changes that accompany venous insufficiency and the long term sequela such as  infection and recurring  ulceration.  Patient will be placed in [employer] which will be changed weekly drainage permitting.  In addition, behavioral modification including several periods of elevation of the lower extremities during the day will be continued. Achieving a position with the ankles at heart level was stressed to the patient  The patient is instructed to begin routine exercise, especially walking on a daily basis  She has been compliant with her [id number] pump since 2019, however she can no longer tolerate treatment due to pain and sensitivity which has exacerbated the condition.  She will require the program ability of an [id number] device to resume treatment and prevent further exacerbation.    Following the review of the ultrasound the patient will follow up in four weeks to reassess the degree of swelling and the control that [place] therapy is offering.    2. Essential hypertension Continue antihypertensive medications as already ordered, these medications have been reviewed and there are no changes at this time.   3. Chronic congestive heart failure, unspecified heart failure type [facility]) This is also likely contributing to her swelling.  Patient will continue with current medication regimen and will continue to follow cardiology.   Current Outpatient Medications on File Prior to Visit  Medication Sig Dispense Refill   acetaminophen (TYLENOL) 500 MG tablet Take 500-1,000 mg by mouth every 6 (six) hours as needed for mild pain or moderate pain.     apixaban (ELIQUIS) 5 MG TABS tablet Take 5 mg by mouth 2 (two) times daily.     DULoxetine (CYMBALTA) 60 MG capsule Take 60 mg by mouth daily.     furosemide (LASIX) 20 MG tablet Take 1 tablet (20 mg total) by mouth 2 (two) times daily. 60 tablet 0   ipratropium-albuterol (DUONEB) 0.5-2.5 (3) MG/3ML SOLN Take 3 mLs by nebulization every 6 (six) hours as needed (shortness of breath). 360 mL 0   levothyroxine (SYNTHROID) 200 MCG tablet Take 200  mcg by mouth daily.     metoprolol succinate (TOPROL-XL) 25 MG 24 hr tablet Take 0.5 tablets (12.5 mg total) by mouth at bedtime. 15 tablet 0   montelukast (SINGULAIR) 10 MG tablet Take 10 mg by mouth daily.     Multiple Vitamins-Minerals (PRESERVISION AREDS 2+MULTI VIT) CAPS Take 1 capsule by mouth as directed.     traZODone (DESYREL) 50 MG tablet Take 1 tablet (50 mg total) by mouth at bedtime as needed for sleep. 30 tablet 0   VENTOLIN HFA 108 (90 Base) MCG/ACT inhaler Inhale 2 puffs into the lungs every 6 (six) hours as needed for wheezing or shortness of breath.  12   vitamin B-12 (CYANOCOBALAMIN) 500 MCG tablet Take 500 mcg by mouth daily.     No current facility-administered medications on file prior to visit.    There are no Patient Instructions on file for this visit. No follow-ups on file.   Kris Hartmann, NP

## 2020-08-12 DIAGNOSIS — G4733 Obstructive sleep apnea (adult) (pediatric): Secondary | ICD-10-CM | POA: Diagnosis not present

## 2020-08-14 DIAGNOSIS — I130 Hypertensive heart and chronic kidney disease with heart failure and stage 1 through stage 4 chronic kidney disease, or unspecified chronic kidney disease: Secondary | ICD-10-CM | POA: Diagnosis not present

## 2020-08-14 DIAGNOSIS — M1030 Gout due to renal impairment, unspecified site: Secondary | ICD-10-CM | POA: Diagnosis not present

## 2020-08-14 DIAGNOSIS — N390 Urinary tract infection, site not specified: Secondary | ICD-10-CM | POA: Diagnosis not present

## 2020-08-14 DIAGNOSIS — I5032 Chronic diastolic (congestive) heart failure: Secondary | ICD-10-CM | POA: Diagnosis not present

## 2020-08-14 DIAGNOSIS — A4151 Sepsis due to Escherichia coli [E. coli]: Secondary | ICD-10-CM | POA: Diagnosis not present

## 2020-08-14 DIAGNOSIS — J449 Chronic obstructive pulmonary disease, unspecified: Secondary | ICD-10-CM | POA: Diagnosis not present

## 2020-08-14 DIAGNOSIS — I482 Chronic atrial fibrillation, unspecified: Secondary | ICD-10-CM | POA: Diagnosis not present

## 2020-08-14 DIAGNOSIS — I739 Peripheral vascular disease, unspecified: Secondary | ICD-10-CM | POA: Diagnosis not present

## 2020-08-14 DIAGNOSIS — N183 Chronic kidney disease, stage 3 unspecified: Secondary | ICD-10-CM | POA: Diagnosis not present

## 2020-08-15 DIAGNOSIS — I890 Lymphedema, not elsewhere classified: Secondary | ICD-10-CM | POA: Diagnosis not present

## 2020-08-15 DIAGNOSIS — Z8679 Personal history of other diseases of the circulatory system: Secondary | ICD-10-CM | POA: Diagnosis not present

## 2020-08-15 DIAGNOSIS — L03116 Cellulitis of left lower limb: Secondary | ICD-10-CM | POA: Diagnosis not present

## 2020-08-16 DIAGNOSIS — I130 Hypertensive heart and chronic kidney disease with heart failure and stage 1 through stage 4 chronic kidney disease, or unspecified chronic kidney disease: Secondary | ICD-10-CM | POA: Diagnosis not present

## 2020-08-16 DIAGNOSIS — J449 Chronic obstructive pulmonary disease, unspecified: Secondary | ICD-10-CM | POA: Diagnosis not present

## 2020-08-16 DIAGNOSIS — N390 Urinary tract infection, site not specified: Secondary | ICD-10-CM | POA: Diagnosis not present

## 2020-08-16 DIAGNOSIS — N183 Chronic kidney disease, stage 3 unspecified: Secondary | ICD-10-CM | POA: Diagnosis not present

## 2020-08-16 DIAGNOSIS — A4151 Sepsis due to Escherichia coli [E. coli]: Secondary | ICD-10-CM | POA: Diagnosis not present

## 2020-08-16 DIAGNOSIS — M1030 Gout due to renal impairment, unspecified site: Secondary | ICD-10-CM | POA: Diagnosis not present

## 2020-08-16 DIAGNOSIS — I5032 Chronic diastolic (congestive) heart failure: Secondary | ICD-10-CM | POA: Diagnosis not present

## 2020-08-16 DIAGNOSIS — I482 Chronic atrial fibrillation, unspecified: Secondary | ICD-10-CM | POA: Diagnosis not present

## 2020-08-16 DIAGNOSIS — I739 Peripheral vascular disease, unspecified: Secondary | ICD-10-CM | POA: Diagnosis not present

## 2020-08-17 DIAGNOSIS — I5032 Chronic diastolic (congestive) heart failure: Secondary | ICD-10-CM | POA: Diagnosis not present

## 2020-08-17 DIAGNOSIS — I482 Chronic atrial fibrillation, unspecified: Secondary | ICD-10-CM | POA: Diagnosis not present

## 2020-08-17 DIAGNOSIS — J449 Chronic obstructive pulmonary disease, unspecified: Secondary | ICD-10-CM | POA: Diagnosis not present

## 2020-08-17 DIAGNOSIS — M1030 Gout due to renal impairment, unspecified site: Secondary | ICD-10-CM | POA: Diagnosis not present

## 2020-08-17 DIAGNOSIS — I130 Hypertensive heart and chronic kidney disease with heart failure and stage 1 through stage 4 chronic kidney disease, or unspecified chronic kidney disease: Secondary | ICD-10-CM | POA: Diagnosis not present

## 2020-08-17 DIAGNOSIS — N390 Urinary tract infection, site not specified: Secondary | ICD-10-CM | POA: Diagnosis not present

## 2020-08-17 DIAGNOSIS — N183 Chronic kidney disease, stage 3 unspecified: Secondary | ICD-10-CM | POA: Diagnosis not present

## 2020-08-17 DIAGNOSIS — A4151 Sepsis due to Escherichia coli [E. coli]: Secondary | ICD-10-CM | POA: Diagnosis not present

## 2020-08-17 DIAGNOSIS — I739 Peripheral vascular disease, unspecified: Secondary | ICD-10-CM | POA: Diagnosis not present

## 2020-08-21 DIAGNOSIS — J449 Chronic obstructive pulmonary disease, unspecified: Secondary | ICD-10-CM | POA: Diagnosis not present

## 2020-08-21 DIAGNOSIS — I130 Hypertensive heart and chronic kidney disease with heart failure and stage 1 through stage 4 chronic kidney disease, or unspecified chronic kidney disease: Secondary | ICD-10-CM | POA: Diagnosis not present

## 2020-08-21 DIAGNOSIS — A4151 Sepsis due to Escherichia coli [E. coli]: Secondary | ICD-10-CM | POA: Diagnosis not present

## 2020-08-21 DIAGNOSIS — N183 Chronic kidney disease, stage 3 unspecified: Secondary | ICD-10-CM | POA: Diagnosis not present

## 2020-08-21 DIAGNOSIS — I5032 Chronic diastolic (congestive) heart failure: Secondary | ICD-10-CM | POA: Diagnosis not present

## 2020-08-21 DIAGNOSIS — I482 Chronic atrial fibrillation, unspecified: Secondary | ICD-10-CM | POA: Diagnosis not present

## 2020-08-21 DIAGNOSIS — N390 Urinary tract infection, site not specified: Secondary | ICD-10-CM | POA: Diagnosis not present

## 2020-08-21 DIAGNOSIS — I739 Peripheral vascular disease, unspecified: Secondary | ICD-10-CM | POA: Diagnosis not present

## 2020-08-21 DIAGNOSIS — M1030 Gout due to renal impairment, unspecified site: Secondary | ICD-10-CM | POA: Diagnosis not present

## 2020-08-22 DIAGNOSIS — M1030 Gout due to renal impairment, unspecified site: Secondary | ICD-10-CM | POA: Diagnosis not present

## 2020-08-22 DIAGNOSIS — N390 Urinary tract infection, site not specified: Secondary | ICD-10-CM | POA: Diagnosis not present

## 2020-08-22 DIAGNOSIS — J449 Chronic obstructive pulmonary disease, unspecified: Secondary | ICD-10-CM | POA: Diagnosis not present

## 2020-08-22 DIAGNOSIS — I130 Hypertensive heart and chronic kidney disease with heart failure and stage 1 through stage 4 chronic kidney disease, or unspecified chronic kidney disease: Secondary | ICD-10-CM | POA: Diagnosis not present

## 2020-08-22 DIAGNOSIS — A4151 Sepsis due to Escherichia coli [E. coli]: Secondary | ICD-10-CM | POA: Diagnosis not present

## 2020-08-22 DIAGNOSIS — I739 Peripheral vascular disease, unspecified: Secondary | ICD-10-CM | POA: Diagnosis not present

## 2020-08-22 DIAGNOSIS — N183 Chronic kidney disease, stage 3 unspecified: Secondary | ICD-10-CM | POA: Diagnosis not present

## 2020-08-22 DIAGNOSIS — I5032 Chronic diastolic (congestive) heart failure: Secondary | ICD-10-CM | POA: Diagnosis not present

## 2020-08-22 DIAGNOSIS — I482 Chronic atrial fibrillation, unspecified: Secondary | ICD-10-CM | POA: Diagnosis not present

## 2020-08-24 DIAGNOSIS — A4151 Sepsis due to Escherichia coli [E. coli]: Secondary | ICD-10-CM | POA: Diagnosis not present

## 2020-08-24 DIAGNOSIS — I5032 Chronic diastolic (congestive) heart failure: Secondary | ICD-10-CM | POA: Diagnosis not present

## 2020-08-24 DIAGNOSIS — I130 Hypertensive heart and chronic kidney disease with heart failure and stage 1 through stage 4 chronic kidney disease, or unspecified chronic kidney disease: Secondary | ICD-10-CM | POA: Diagnosis not present

## 2020-08-24 DIAGNOSIS — N183 Chronic kidney disease, stage 3 unspecified: Secondary | ICD-10-CM | POA: Diagnosis not present

## 2020-08-24 DIAGNOSIS — J449 Chronic obstructive pulmonary disease, unspecified: Secondary | ICD-10-CM | POA: Diagnosis not present

## 2020-08-24 DIAGNOSIS — N390 Urinary tract infection, site not specified: Secondary | ICD-10-CM | POA: Diagnosis not present

## 2020-08-24 DIAGNOSIS — I739 Peripheral vascular disease, unspecified: Secondary | ICD-10-CM | POA: Diagnosis not present

## 2020-08-24 DIAGNOSIS — I482 Chronic atrial fibrillation, unspecified: Secondary | ICD-10-CM | POA: Diagnosis not present

## 2020-08-24 DIAGNOSIS — M1030 Gout due to renal impairment, unspecified site: Secondary | ICD-10-CM | POA: Diagnosis not present

## 2020-08-28 DIAGNOSIS — I482 Chronic atrial fibrillation, unspecified: Secondary | ICD-10-CM | POA: Diagnosis not present

## 2020-08-28 DIAGNOSIS — I130 Hypertensive heart and chronic kidney disease with heart failure and stage 1 through stage 4 chronic kidney disease, or unspecified chronic kidney disease: Secondary | ICD-10-CM | POA: Diagnosis not present

## 2020-08-28 DIAGNOSIS — J449 Chronic obstructive pulmonary disease, unspecified: Secondary | ICD-10-CM | POA: Diagnosis not present

## 2020-08-28 DIAGNOSIS — N390 Urinary tract infection, site not specified: Secondary | ICD-10-CM | POA: Diagnosis not present

## 2020-08-28 DIAGNOSIS — M1030 Gout due to renal impairment, unspecified site: Secondary | ICD-10-CM | POA: Diagnosis not present

## 2020-08-28 DIAGNOSIS — I739 Peripheral vascular disease, unspecified: Secondary | ICD-10-CM | POA: Diagnosis not present

## 2020-08-28 DIAGNOSIS — N183 Chronic kidney disease, stage 3 unspecified: Secondary | ICD-10-CM | POA: Diagnosis not present

## 2020-08-28 DIAGNOSIS — I5032 Chronic diastolic (congestive) heart failure: Secondary | ICD-10-CM | POA: Diagnosis not present

## 2020-08-28 DIAGNOSIS — A4151 Sepsis due to Escherichia coli [E. coli]: Secondary | ICD-10-CM | POA: Diagnosis not present

## 2020-08-29 DIAGNOSIS — A4151 Sepsis due to Escherichia coli [E. coli]: Secondary | ICD-10-CM | POA: Diagnosis not present

## 2020-08-29 DIAGNOSIS — I5032 Chronic diastolic (congestive) heart failure: Secondary | ICD-10-CM | POA: Diagnosis not present

## 2020-08-29 DIAGNOSIS — J449 Chronic obstructive pulmonary disease, unspecified: Secondary | ICD-10-CM | POA: Diagnosis not present

## 2020-08-29 DIAGNOSIS — N183 Chronic kidney disease, stage 3 unspecified: Secondary | ICD-10-CM | POA: Diagnosis not present

## 2020-08-29 DIAGNOSIS — I739 Peripheral vascular disease, unspecified: Secondary | ICD-10-CM | POA: Diagnosis not present

## 2020-08-29 DIAGNOSIS — M1030 Gout due to renal impairment, unspecified site: Secondary | ICD-10-CM | POA: Diagnosis not present

## 2020-08-29 DIAGNOSIS — I130 Hypertensive heart and chronic kidney disease with heart failure and stage 1 through stage 4 chronic kidney disease, or unspecified chronic kidney disease: Secondary | ICD-10-CM | POA: Diagnosis not present

## 2020-08-29 DIAGNOSIS — N390 Urinary tract infection, site not specified: Secondary | ICD-10-CM | POA: Diagnosis not present

## 2020-08-29 DIAGNOSIS — I482 Chronic atrial fibrillation, unspecified: Secondary | ICD-10-CM | POA: Diagnosis not present

## 2020-08-30 DIAGNOSIS — I482 Chronic atrial fibrillation, unspecified: Secondary | ICD-10-CM | POA: Diagnosis not present

## 2020-08-30 DIAGNOSIS — A4151 Sepsis due to Escherichia coli [E. coli]: Secondary | ICD-10-CM | POA: Diagnosis not present

## 2020-08-30 DIAGNOSIS — J449 Chronic obstructive pulmonary disease, unspecified: Secondary | ICD-10-CM | POA: Diagnosis not present

## 2020-08-30 DIAGNOSIS — I5032 Chronic diastolic (congestive) heart failure: Secondary | ICD-10-CM | POA: Diagnosis not present

## 2020-08-30 DIAGNOSIS — N390 Urinary tract infection, site not specified: Secondary | ICD-10-CM | POA: Diagnosis not present

## 2020-08-30 DIAGNOSIS — I739 Peripheral vascular disease, unspecified: Secondary | ICD-10-CM | POA: Diagnosis not present

## 2020-08-30 DIAGNOSIS — M1030 Gout due to renal impairment, unspecified site: Secondary | ICD-10-CM | POA: Diagnosis not present

## 2020-08-30 DIAGNOSIS — I130 Hypertensive heart and chronic kidney disease with heart failure and stage 1 through stage 4 chronic kidney disease, or unspecified chronic kidney disease: Secondary | ICD-10-CM | POA: Diagnosis not present

## 2020-08-30 DIAGNOSIS — N183 Chronic kidney disease, stage 3 unspecified: Secondary | ICD-10-CM | POA: Diagnosis not present

## 2020-08-31 DIAGNOSIS — I5032 Chronic diastolic (congestive) heart failure: Secondary | ICD-10-CM | POA: Diagnosis not present

## 2020-08-31 DIAGNOSIS — J449 Chronic obstructive pulmonary disease, unspecified: Secondary | ICD-10-CM | POA: Diagnosis not present

## 2020-08-31 DIAGNOSIS — I482 Chronic atrial fibrillation, unspecified: Secondary | ICD-10-CM | POA: Diagnosis not present

## 2020-08-31 DIAGNOSIS — N183 Chronic kidney disease, stage 3 unspecified: Secondary | ICD-10-CM | POA: Diagnosis not present

## 2020-08-31 DIAGNOSIS — A4151 Sepsis due to Escherichia coli [E. coli]: Secondary | ICD-10-CM | POA: Diagnosis not present

## 2020-08-31 DIAGNOSIS — N390 Urinary tract infection, site not specified: Secondary | ICD-10-CM | POA: Diagnosis not present

## 2020-08-31 DIAGNOSIS — I130 Hypertensive heart and chronic kidney disease with heart failure and stage 1 through stage 4 chronic kidney disease, or unspecified chronic kidney disease: Secondary | ICD-10-CM | POA: Diagnosis not present

## 2020-08-31 DIAGNOSIS — I739 Peripheral vascular disease, unspecified: Secondary | ICD-10-CM | POA: Diagnosis not present

## 2020-08-31 DIAGNOSIS — M1030 Gout due to renal impairment, unspecified site: Secondary | ICD-10-CM | POA: Diagnosis not present

## 2020-09-04 DIAGNOSIS — A4151 Sepsis due to Escherichia coli [E. coli]: Secondary | ICD-10-CM | POA: Diagnosis not present

## 2020-09-04 DIAGNOSIS — I739 Peripheral vascular disease, unspecified: Secondary | ICD-10-CM | POA: Diagnosis not present

## 2020-09-04 DIAGNOSIS — J449 Chronic obstructive pulmonary disease, unspecified: Secondary | ICD-10-CM | POA: Diagnosis not present

## 2020-09-04 DIAGNOSIS — I130 Hypertensive heart and chronic kidney disease with heart failure and stage 1 through stage 4 chronic kidney disease, or unspecified chronic kidney disease: Secondary | ICD-10-CM | POA: Diagnosis not present

## 2020-09-04 DIAGNOSIS — I482 Chronic atrial fibrillation, unspecified: Secondary | ICD-10-CM | POA: Diagnosis not present

## 2020-09-04 DIAGNOSIS — I5032 Chronic diastolic (congestive) heart failure: Secondary | ICD-10-CM | POA: Diagnosis not present

## 2020-09-04 DIAGNOSIS — N390 Urinary tract infection, site not specified: Secondary | ICD-10-CM | POA: Diagnosis not present

## 2020-09-04 DIAGNOSIS — M1030 Gout due to renal impairment, unspecified site: Secondary | ICD-10-CM | POA: Diagnosis not present

## 2020-09-04 DIAGNOSIS — N183 Chronic kidney disease, stage 3 unspecified: Secondary | ICD-10-CM | POA: Diagnosis not present

## 2020-09-08 DIAGNOSIS — I482 Chronic atrial fibrillation, unspecified: Secondary | ICD-10-CM | POA: Diagnosis not present

## 2020-09-08 DIAGNOSIS — I130 Hypertensive heart and chronic kidney disease with heart failure and stage 1 through stage 4 chronic kidney disease, or unspecified chronic kidney disease: Secondary | ICD-10-CM | POA: Diagnosis not present

## 2020-09-08 DIAGNOSIS — J449 Chronic obstructive pulmonary disease, unspecified: Secondary | ICD-10-CM | POA: Diagnosis not present

## 2020-09-08 DIAGNOSIS — M1030 Gout due to renal impairment, unspecified site: Secondary | ICD-10-CM | POA: Diagnosis not present

## 2020-09-08 DIAGNOSIS — A4151 Sepsis due to Escherichia coli [E. coli]: Secondary | ICD-10-CM | POA: Diagnosis not present

## 2020-09-08 DIAGNOSIS — I5032 Chronic diastolic (congestive) heart failure: Secondary | ICD-10-CM | POA: Diagnosis not present

## 2020-09-08 DIAGNOSIS — I739 Peripheral vascular disease, unspecified: Secondary | ICD-10-CM | POA: Diagnosis not present

## 2020-09-08 DIAGNOSIS — N183 Chronic kidney disease, stage 3 unspecified: Secondary | ICD-10-CM | POA: Diagnosis not present

## 2020-09-08 DIAGNOSIS — N390 Urinary tract infection, site not specified: Secondary | ICD-10-CM | POA: Diagnosis not present

## 2020-09-13 DIAGNOSIS — I872 Venous insufficiency (chronic) (peripheral): Secondary | ICD-10-CM | POA: Diagnosis not present

## 2020-09-13 DIAGNOSIS — L97821 Non-pressure chronic ulcer of other part of left lower leg limited to breakdown of skin: Secondary | ICD-10-CM | POA: Diagnosis not present

## 2020-09-13 DIAGNOSIS — J449 Chronic obstructive pulmonary disease, unspecified: Secondary | ICD-10-CM | POA: Diagnosis not present

## 2020-09-13 DIAGNOSIS — I890 Lymphedema, not elsewhere classified: Secondary | ICD-10-CM | POA: Diagnosis not present

## 2020-09-13 DIAGNOSIS — L97811 Non-pressure chronic ulcer of other part of right lower leg limited to breakdown of skin: Secondary | ICD-10-CM | POA: Diagnosis not present

## 2020-09-13 DIAGNOSIS — I5032 Chronic diastolic (congestive) heart failure: Secondary | ICD-10-CM | POA: Diagnosis not present

## 2020-09-13 DIAGNOSIS — N183 Chronic kidney disease, stage 3 unspecified: Secondary | ICD-10-CM | POA: Diagnosis not present

## 2020-09-13 DIAGNOSIS — M1030 Gout due to renal impairment, unspecified site: Secondary | ICD-10-CM | POA: Diagnosis not present

## 2020-09-13 DIAGNOSIS — I130 Hypertensive heart and chronic kidney disease with heart failure and stage 1 through stage 4 chronic kidney disease, or unspecified chronic kidney disease: Secondary | ICD-10-CM | POA: Diagnosis not present

## 2020-09-17 DIAGNOSIS — M25551 Pain in right hip: Secondary | ICD-10-CM | POA: Diagnosis not present

## 2020-09-17 DIAGNOSIS — M5416 Radiculopathy, lumbar region: Secondary | ICD-10-CM | POA: Diagnosis not present

## 2020-09-18 DIAGNOSIS — Z6841 Body Mass Index (BMI) 40.0 and over, adult: Secondary | ICD-10-CM | POA: Diagnosis not present

## 2020-09-18 DIAGNOSIS — L03119 Cellulitis of unspecified part of limb: Secondary | ICD-10-CM | POA: Diagnosis not present

## 2020-09-18 DIAGNOSIS — I890 Lymphedema, not elsewhere classified: Secondary | ICD-10-CM | POA: Diagnosis not present

## 2020-09-21 DIAGNOSIS — I5032 Chronic diastolic (congestive) heart failure: Secondary | ICD-10-CM | POA: Diagnosis not present

## 2020-09-21 DIAGNOSIS — I890 Lymphedema, not elsewhere classified: Secondary | ICD-10-CM | POA: Diagnosis not present

## 2020-09-21 DIAGNOSIS — L97821 Non-pressure chronic ulcer of other part of left lower leg limited to breakdown of skin: Secondary | ICD-10-CM | POA: Diagnosis not present

## 2020-09-21 DIAGNOSIS — I130 Hypertensive heart and chronic kidney disease with heart failure and stage 1 through stage 4 chronic kidney disease, or unspecified chronic kidney disease: Secondary | ICD-10-CM | POA: Diagnosis not present

## 2020-09-21 DIAGNOSIS — N183 Chronic kidney disease, stage 3 unspecified: Secondary | ICD-10-CM | POA: Diagnosis not present

## 2020-09-21 DIAGNOSIS — I872 Venous insufficiency (chronic) (peripheral): Secondary | ICD-10-CM | POA: Diagnosis not present

## 2020-09-21 DIAGNOSIS — L97811 Non-pressure chronic ulcer of other part of right lower leg limited to breakdown of skin: Secondary | ICD-10-CM | POA: Diagnosis not present

## 2020-09-21 DIAGNOSIS — J449 Chronic obstructive pulmonary disease, unspecified: Secondary | ICD-10-CM | POA: Diagnosis not present

## 2020-09-21 DIAGNOSIS — M1030 Gout due to renal impairment, unspecified site: Secondary | ICD-10-CM | POA: Diagnosis not present

## 2020-09-24 DIAGNOSIS — L97811 Non-pressure chronic ulcer of other part of right lower leg limited to breakdown of skin: Secondary | ICD-10-CM | POA: Diagnosis not present

## 2020-09-24 DIAGNOSIS — M1030 Gout due to renal impairment, unspecified site: Secondary | ICD-10-CM | POA: Diagnosis not present

## 2020-09-24 DIAGNOSIS — L97821 Non-pressure chronic ulcer of other part of left lower leg limited to breakdown of skin: Secondary | ICD-10-CM | POA: Diagnosis not present

## 2020-09-24 DIAGNOSIS — I130 Hypertensive heart and chronic kidney disease with heart failure and stage 1 through stage 4 chronic kidney disease, or unspecified chronic kidney disease: Secondary | ICD-10-CM | POA: Diagnosis not present

## 2020-09-24 DIAGNOSIS — I872 Venous insufficiency (chronic) (peripheral): Secondary | ICD-10-CM | POA: Diagnosis not present

## 2020-09-24 DIAGNOSIS — I5032 Chronic diastolic (congestive) heart failure: Secondary | ICD-10-CM | POA: Diagnosis not present

## 2020-09-24 DIAGNOSIS — J449 Chronic obstructive pulmonary disease, unspecified: Secondary | ICD-10-CM | POA: Diagnosis not present

## 2020-09-24 DIAGNOSIS — N183 Chronic kidney disease, stage 3 unspecified: Secondary | ICD-10-CM | POA: Diagnosis not present

## 2020-09-24 DIAGNOSIS — I890 Lymphedema, not elsewhere classified: Secondary | ICD-10-CM | POA: Diagnosis not present

## 2020-09-28 DIAGNOSIS — I890 Lymphedema, not elsewhere classified: Secondary | ICD-10-CM | POA: Diagnosis not present

## 2020-09-28 DIAGNOSIS — I130 Hypertensive heart and chronic kidney disease with heart failure and stage 1 through stage 4 chronic kidney disease, or unspecified chronic kidney disease: Secondary | ICD-10-CM | POA: Diagnosis not present

## 2020-09-28 DIAGNOSIS — J449 Chronic obstructive pulmonary disease, unspecified: Secondary | ICD-10-CM | POA: Diagnosis not present

## 2020-09-28 DIAGNOSIS — L97821 Non-pressure chronic ulcer of other part of left lower leg limited to breakdown of skin: Secondary | ICD-10-CM | POA: Diagnosis not present

## 2020-09-28 DIAGNOSIS — I5032 Chronic diastolic (congestive) heart failure: Secondary | ICD-10-CM | POA: Diagnosis not present

## 2020-09-28 DIAGNOSIS — I872 Venous insufficiency (chronic) (peripheral): Secondary | ICD-10-CM | POA: Diagnosis not present

## 2020-09-28 DIAGNOSIS — L97811 Non-pressure chronic ulcer of other part of right lower leg limited to breakdown of skin: Secondary | ICD-10-CM | POA: Diagnosis not present

## 2020-09-28 DIAGNOSIS — M1030 Gout due to renal impairment, unspecified site: Secondary | ICD-10-CM | POA: Diagnosis not present

## 2020-09-28 DIAGNOSIS — N183 Chronic kidney disease, stage 3 unspecified: Secondary | ICD-10-CM | POA: Diagnosis not present

## 2020-10-02 DIAGNOSIS — I5032 Chronic diastolic (congestive) heart failure: Secondary | ICD-10-CM | POA: Diagnosis not present

## 2020-10-02 DIAGNOSIS — M1030 Gout due to renal impairment, unspecified site: Secondary | ICD-10-CM | POA: Diagnosis not present

## 2020-10-02 DIAGNOSIS — L97811 Non-pressure chronic ulcer of other part of right lower leg limited to breakdown of skin: Secondary | ICD-10-CM | POA: Diagnosis not present

## 2020-10-02 DIAGNOSIS — N183 Chronic kidney disease, stage 3 unspecified: Secondary | ICD-10-CM | POA: Diagnosis not present

## 2020-10-02 DIAGNOSIS — I130 Hypertensive heart and chronic kidney disease with heart failure and stage 1 through stage 4 chronic kidney disease, or unspecified chronic kidney disease: Secondary | ICD-10-CM | POA: Diagnosis not present

## 2020-10-02 DIAGNOSIS — I872 Venous insufficiency (chronic) (peripheral): Secondary | ICD-10-CM | POA: Diagnosis not present

## 2020-10-02 DIAGNOSIS — J449 Chronic obstructive pulmonary disease, unspecified: Secondary | ICD-10-CM | POA: Diagnosis not present

## 2020-10-02 DIAGNOSIS — I890 Lymphedema, not elsewhere classified: Secondary | ICD-10-CM | POA: Diagnosis not present

## 2020-10-02 DIAGNOSIS — L97821 Non-pressure chronic ulcer of other part of left lower leg limited to breakdown of skin: Secondary | ICD-10-CM | POA: Diagnosis not present

## 2020-10-03 DIAGNOSIS — N1831 Chronic kidney disease, stage 3a: Secondary | ICD-10-CM | POA: Diagnosis not present

## 2020-10-03 DIAGNOSIS — I10 Essential (primary) hypertension: Secondary | ICD-10-CM | POA: Diagnosis not present

## 2020-10-03 DIAGNOSIS — E210 Primary hyperparathyroidism: Secondary | ICD-10-CM | POA: Diagnosis not present

## 2020-10-03 DIAGNOSIS — R600 Localized edema: Secondary | ICD-10-CM | POA: Diagnosis not present

## 2020-10-06 DIAGNOSIS — L97821 Non-pressure chronic ulcer of other part of left lower leg limited to breakdown of skin: Secondary | ICD-10-CM | POA: Diagnosis not present

## 2020-10-06 DIAGNOSIS — M1030 Gout due to renal impairment, unspecified site: Secondary | ICD-10-CM | POA: Diagnosis not present

## 2020-10-06 DIAGNOSIS — N183 Chronic kidney disease, stage 3 unspecified: Secondary | ICD-10-CM | POA: Diagnosis not present

## 2020-10-06 DIAGNOSIS — I890 Lymphedema, not elsewhere classified: Secondary | ICD-10-CM | POA: Diagnosis not present

## 2020-10-06 DIAGNOSIS — L97811 Non-pressure chronic ulcer of other part of right lower leg limited to breakdown of skin: Secondary | ICD-10-CM | POA: Diagnosis not present

## 2020-10-06 DIAGNOSIS — I5032 Chronic diastolic (congestive) heart failure: Secondary | ICD-10-CM | POA: Diagnosis not present

## 2020-10-06 DIAGNOSIS — I130 Hypertensive heart and chronic kidney disease with heart failure and stage 1 through stage 4 chronic kidney disease, or unspecified chronic kidney disease: Secondary | ICD-10-CM | POA: Diagnosis not present

## 2020-10-06 DIAGNOSIS — J449 Chronic obstructive pulmonary disease, unspecified: Secondary | ICD-10-CM | POA: Diagnosis not present

## 2020-10-06 DIAGNOSIS — I872 Venous insufficiency (chronic) (peripheral): Secondary | ICD-10-CM | POA: Diagnosis not present

## 2020-10-08 DIAGNOSIS — M1030 Gout due to renal impairment, unspecified site: Secondary | ICD-10-CM | POA: Diagnosis not present

## 2020-10-08 DIAGNOSIS — I890 Lymphedema, not elsewhere classified: Secondary | ICD-10-CM | POA: Diagnosis not present

## 2020-10-08 DIAGNOSIS — I130 Hypertensive heart and chronic kidney disease with heart failure and stage 1 through stage 4 chronic kidney disease, or unspecified chronic kidney disease: Secondary | ICD-10-CM | POA: Diagnosis not present

## 2020-10-08 DIAGNOSIS — I872 Venous insufficiency (chronic) (peripheral): Secondary | ICD-10-CM | POA: Diagnosis not present

## 2020-10-08 DIAGNOSIS — J449 Chronic obstructive pulmonary disease, unspecified: Secondary | ICD-10-CM | POA: Diagnosis not present

## 2020-10-08 DIAGNOSIS — N183 Chronic kidney disease, stage 3 unspecified: Secondary | ICD-10-CM | POA: Diagnosis not present

## 2020-10-08 DIAGNOSIS — L97821 Non-pressure chronic ulcer of other part of left lower leg limited to breakdown of skin: Secondary | ICD-10-CM | POA: Diagnosis not present

## 2020-10-08 DIAGNOSIS — L97811 Non-pressure chronic ulcer of other part of right lower leg limited to breakdown of skin: Secondary | ICD-10-CM | POA: Diagnosis not present

## 2020-10-08 DIAGNOSIS — I5032 Chronic diastolic (congestive) heart failure: Secondary | ICD-10-CM | POA: Diagnosis not present

## 2020-10-09 DIAGNOSIS — J449 Chronic obstructive pulmonary disease, unspecified: Secondary | ICD-10-CM | POA: Diagnosis not present

## 2020-10-09 DIAGNOSIS — R0609 Other forms of dyspnea: Secondary | ICD-10-CM | POA: Diagnosis not present

## 2020-10-09 DIAGNOSIS — G4733 Obstructive sleep apnea (adult) (pediatric): Secondary | ICD-10-CM | POA: Diagnosis not present

## 2020-10-12 DIAGNOSIS — I130 Hypertensive heart and chronic kidney disease with heart failure and stage 1 through stage 4 chronic kidney disease, or unspecified chronic kidney disease: Secondary | ICD-10-CM | POA: Diagnosis not present

## 2020-10-12 DIAGNOSIS — J309 Allergic rhinitis, unspecified: Secondary | ICD-10-CM | POA: Diagnosis not present

## 2020-10-12 DIAGNOSIS — I4811 Longstanding persistent atrial fibrillation: Secondary | ICD-10-CM | POA: Diagnosis not present

## 2020-10-12 DIAGNOSIS — E892 Postprocedural hypoparathyroidism: Secondary | ICD-10-CM | POA: Diagnosis not present

## 2020-10-12 DIAGNOSIS — Z1389 Encounter for screening for other disorder: Secondary | ICD-10-CM | POA: Diagnosis not present

## 2020-10-12 DIAGNOSIS — I890 Lymphedema, not elsewhere classified: Secondary | ICD-10-CM | POA: Diagnosis not present

## 2020-10-12 DIAGNOSIS — G4733 Obstructive sleep apnea (adult) (pediatric): Secondary | ICD-10-CM | POA: Diagnosis not present

## 2020-10-12 DIAGNOSIS — N183 Chronic kidney disease, stage 3 unspecified: Secondary | ICD-10-CM | POA: Diagnosis not present

## 2020-10-12 DIAGNOSIS — Z0000 Encounter for general adult medical examination without abnormal findings: Secondary | ICD-10-CM | POA: Diagnosis not present

## 2020-10-12 DIAGNOSIS — J439 Emphysema, unspecified: Secondary | ICD-10-CM | POA: Diagnosis not present

## 2020-10-15 DIAGNOSIS — G4733 Obstructive sleep apnea (adult) (pediatric): Secondary | ICD-10-CM | POA: Diagnosis not present

## 2020-10-15 DIAGNOSIS — Z6841 Body Mass Index (BMI) 40.0 and over, adult: Secondary | ICD-10-CM | POA: Diagnosis not present

## 2020-10-15 DIAGNOSIS — I10 Essential (primary) hypertension: Secondary | ICD-10-CM | POA: Diagnosis not present

## 2020-10-15 DIAGNOSIS — J439 Emphysema, unspecified: Secondary | ICD-10-CM | POA: Diagnosis not present

## 2020-10-15 DIAGNOSIS — J4520 Mild intermittent asthma, uncomplicated: Secondary | ICD-10-CM | POA: Diagnosis not present

## 2020-10-15 DIAGNOSIS — I4811 Longstanding persistent atrial fibrillation: Secondary | ICD-10-CM | POA: Diagnosis not present

## 2020-10-15 DIAGNOSIS — E782 Mixed hyperlipidemia: Secondary | ICD-10-CM | POA: Diagnosis not present

## 2020-10-17 DIAGNOSIS — J449 Chronic obstructive pulmonary disease, unspecified: Secondary | ICD-10-CM | POA: Diagnosis not present

## 2020-10-17 DIAGNOSIS — L97821 Non-pressure chronic ulcer of other part of left lower leg limited to breakdown of skin: Secondary | ICD-10-CM | POA: Diagnosis not present

## 2020-10-17 DIAGNOSIS — I5032 Chronic diastolic (congestive) heart failure: Secondary | ICD-10-CM | POA: Diagnosis not present

## 2020-10-17 DIAGNOSIS — M1030 Gout due to renal impairment, unspecified site: Secondary | ICD-10-CM | POA: Diagnosis not present

## 2020-10-17 DIAGNOSIS — I890 Lymphedema, not elsewhere classified: Secondary | ICD-10-CM | POA: Diagnosis not present

## 2020-10-17 DIAGNOSIS — N183 Chronic kidney disease, stage 3 unspecified: Secondary | ICD-10-CM | POA: Diagnosis not present

## 2020-10-17 DIAGNOSIS — L97811 Non-pressure chronic ulcer of other part of right lower leg limited to breakdown of skin: Secondary | ICD-10-CM | POA: Diagnosis not present

## 2020-10-17 DIAGNOSIS — I130 Hypertensive heart and chronic kidney disease with heart failure and stage 1 through stage 4 chronic kidney disease, or unspecified chronic kidney disease: Secondary | ICD-10-CM | POA: Diagnosis not present

## 2020-10-17 DIAGNOSIS — I872 Venous insufficiency (chronic) (peripheral): Secondary | ICD-10-CM | POA: Diagnosis not present

## 2020-10-20 DIAGNOSIS — N183 Chronic kidney disease, stage 3 unspecified: Secondary | ICD-10-CM | POA: Diagnosis not present

## 2020-10-20 DIAGNOSIS — L97821 Non-pressure chronic ulcer of other part of left lower leg limited to breakdown of skin: Secondary | ICD-10-CM | POA: Diagnosis not present

## 2020-10-20 DIAGNOSIS — I872 Venous insufficiency (chronic) (peripheral): Secondary | ICD-10-CM | POA: Diagnosis not present

## 2020-10-20 DIAGNOSIS — I890 Lymphedema, not elsewhere classified: Secondary | ICD-10-CM | POA: Diagnosis not present

## 2020-10-20 DIAGNOSIS — I5032 Chronic diastolic (congestive) heart failure: Secondary | ICD-10-CM | POA: Diagnosis not present

## 2020-10-20 DIAGNOSIS — I130 Hypertensive heart and chronic kidney disease with heart failure and stage 1 through stage 4 chronic kidney disease, or unspecified chronic kidney disease: Secondary | ICD-10-CM | POA: Diagnosis not present

## 2020-10-20 DIAGNOSIS — M1030 Gout due to renal impairment, unspecified site: Secondary | ICD-10-CM | POA: Diagnosis not present

## 2020-10-20 DIAGNOSIS — L97811 Non-pressure chronic ulcer of other part of right lower leg limited to breakdown of skin: Secondary | ICD-10-CM | POA: Diagnosis not present

## 2020-10-20 DIAGNOSIS — J449 Chronic obstructive pulmonary disease, unspecified: Secondary | ICD-10-CM | POA: Diagnosis not present

## 2020-10-22 DIAGNOSIS — L97811 Non-pressure chronic ulcer of other part of right lower leg limited to breakdown of skin: Secondary | ICD-10-CM | POA: Diagnosis not present

## 2020-10-22 DIAGNOSIS — N183 Chronic kidney disease, stage 3 unspecified: Secondary | ICD-10-CM | POA: Diagnosis not present

## 2020-10-22 DIAGNOSIS — I5032 Chronic diastolic (congestive) heart failure: Secondary | ICD-10-CM | POA: Diagnosis not present

## 2020-10-22 DIAGNOSIS — L97821 Non-pressure chronic ulcer of other part of left lower leg limited to breakdown of skin: Secondary | ICD-10-CM | POA: Diagnosis not present

## 2020-10-22 DIAGNOSIS — M1030 Gout due to renal impairment, unspecified site: Secondary | ICD-10-CM | POA: Diagnosis not present

## 2020-10-22 DIAGNOSIS — I130 Hypertensive heart and chronic kidney disease with heart failure and stage 1 through stage 4 chronic kidney disease, or unspecified chronic kidney disease: Secondary | ICD-10-CM | POA: Diagnosis not present

## 2020-10-22 DIAGNOSIS — I890 Lymphedema, not elsewhere classified: Secondary | ICD-10-CM | POA: Diagnosis not present

## 2020-10-22 DIAGNOSIS — I872 Venous insufficiency (chronic) (peripheral): Secondary | ICD-10-CM | POA: Diagnosis not present

## 2020-10-22 DIAGNOSIS — J449 Chronic obstructive pulmonary disease, unspecified: Secondary | ICD-10-CM | POA: Diagnosis not present

## 2020-10-25 DIAGNOSIS — I890 Lymphedema, not elsewhere classified: Secondary | ICD-10-CM | POA: Diagnosis not present

## 2020-10-25 DIAGNOSIS — I872 Venous insufficiency (chronic) (peripheral): Secondary | ICD-10-CM | POA: Diagnosis not present

## 2020-10-25 DIAGNOSIS — M1030 Gout due to renal impairment, unspecified site: Secondary | ICD-10-CM | POA: Diagnosis not present

## 2020-10-25 DIAGNOSIS — I130 Hypertensive heart and chronic kidney disease with heart failure and stage 1 through stage 4 chronic kidney disease, or unspecified chronic kidney disease: Secondary | ICD-10-CM | POA: Diagnosis not present

## 2020-10-25 DIAGNOSIS — L97811 Non-pressure chronic ulcer of other part of right lower leg limited to breakdown of skin: Secondary | ICD-10-CM | POA: Diagnosis not present

## 2020-10-25 DIAGNOSIS — N183 Chronic kidney disease, stage 3 unspecified: Secondary | ICD-10-CM | POA: Diagnosis not present

## 2020-10-25 DIAGNOSIS — I5032 Chronic diastolic (congestive) heart failure: Secondary | ICD-10-CM | POA: Diagnosis not present

## 2020-10-25 DIAGNOSIS — L97821 Non-pressure chronic ulcer of other part of left lower leg limited to breakdown of skin: Secondary | ICD-10-CM | POA: Diagnosis not present

## 2020-10-25 DIAGNOSIS — J449 Chronic obstructive pulmonary disease, unspecified: Secondary | ICD-10-CM | POA: Diagnosis not present

## 2020-10-31 DIAGNOSIS — L97821 Non-pressure chronic ulcer of other part of left lower leg limited to breakdown of skin: Secondary | ICD-10-CM | POA: Diagnosis not present

## 2020-10-31 DIAGNOSIS — I5032 Chronic diastolic (congestive) heart failure: Secondary | ICD-10-CM | POA: Diagnosis not present

## 2020-10-31 DIAGNOSIS — I872 Venous insufficiency (chronic) (peripheral): Secondary | ICD-10-CM | POA: Diagnosis not present

## 2020-10-31 DIAGNOSIS — I890 Lymphedema, not elsewhere classified: Secondary | ICD-10-CM | POA: Diagnosis not present

## 2020-10-31 DIAGNOSIS — L97811 Non-pressure chronic ulcer of other part of right lower leg limited to breakdown of skin: Secondary | ICD-10-CM | POA: Diagnosis not present

## 2020-10-31 DIAGNOSIS — N183 Chronic kidney disease, stage 3 unspecified: Secondary | ICD-10-CM | POA: Diagnosis not present

## 2020-10-31 DIAGNOSIS — J449 Chronic obstructive pulmonary disease, unspecified: Secondary | ICD-10-CM | POA: Diagnosis not present

## 2020-10-31 DIAGNOSIS — M1030 Gout due to renal impairment, unspecified site: Secondary | ICD-10-CM | POA: Diagnosis not present

## 2020-10-31 DIAGNOSIS — I130 Hypertensive heart and chronic kidney disease with heart failure and stage 1 through stage 4 chronic kidney disease, or unspecified chronic kidney disease: Secondary | ICD-10-CM | POA: Diagnosis not present

## 2020-11-01 ENCOUNTER — Telehealth (INDEPENDENT_AMBULATORY_CARE_PROVIDER_SITE_OTHER): Payer: Self-pay | Admitting: Nurse Practitioner

## 2020-11-01 DIAGNOSIS — Z6841 Body Mass Index (BMI) 40.0 and over, adult: Secondary | ICD-10-CM | POA: Diagnosis not present

## 2020-11-01 DIAGNOSIS — R300 Dysuria: Secondary | ICD-10-CM | POA: Diagnosis not present

## 2020-11-01 DIAGNOSIS — I890 Lymphedema, not elsewhere classified: Secondary | ICD-10-CM | POA: Diagnosis not present

## 2020-11-01 DIAGNOSIS — L03119 Cellulitis of unspecified part of limb: Secondary | ICD-10-CM | POA: Diagnosis not present

## 2020-11-02 DIAGNOSIS — L97811 Non-pressure chronic ulcer of other part of right lower leg limited to breakdown of skin: Secondary | ICD-10-CM | POA: Diagnosis not present

## 2020-11-02 DIAGNOSIS — M1030 Gout due to renal impairment, unspecified site: Secondary | ICD-10-CM | POA: Diagnosis not present

## 2020-11-02 DIAGNOSIS — I890 Lymphedema, not elsewhere classified: Secondary | ICD-10-CM | POA: Diagnosis not present

## 2020-11-02 DIAGNOSIS — I872 Venous insufficiency (chronic) (peripheral): Secondary | ICD-10-CM | POA: Diagnosis not present

## 2020-11-02 DIAGNOSIS — L97821 Non-pressure chronic ulcer of other part of left lower leg limited to breakdown of skin: Secondary | ICD-10-CM | POA: Diagnosis not present

## 2020-11-02 DIAGNOSIS — I5032 Chronic diastolic (congestive) heart failure: Secondary | ICD-10-CM | POA: Diagnosis not present

## 2020-11-02 DIAGNOSIS — I130 Hypertensive heart and chronic kidney disease with heart failure and stage 1 through stage 4 chronic kidney disease, or unspecified chronic kidney disease: Secondary | ICD-10-CM | POA: Diagnosis not present

## 2020-11-02 DIAGNOSIS — J449 Chronic obstructive pulmonary disease, unspecified: Secondary | ICD-10-CM | POA: Diagnosis not present

## 2020-11-02 DIAGNOSIS — N183 Chronic kidney disease, stage 3 unspecified: Secondary | ICD-10-CM | POA: Diagnosis not present

## 2020-11-02 NOTE — Telephone Encounter (Signed)
I called and  spoke to the daughter and gave her the number to [employer] [facility]

## 2020-11-05 DIAGNOSIS — I5032 Chronic diastolic (congestive) heart failure: Secondary | ICD-10-CM | POA: Diagnosis not present

## 2020-11-05 DIAGNOSIS — L97821 Non-pressure chronic ulcer of other part of left lower leg limited to breakdown of skin: Secondary | ICD-10-CM | POA: Diagnosis not present

## 2020-11-05 DIAGNOSIS — E210 Primary hyperparathyroidism: Secondary | ICD-10-CM | POA: Diagnosis not present

## 2020-11-05 DIAGNOSIS — L97811 Non-pressure chronic ulcer of other part of right lower leg limited to breakdown of skin: Secondary | ICD-10-CM | POA: Diagnosis not present

## 2020-11-05 DIAGNOSIS — I10 Essential (primary) hypertension: Secondary | ICD-10-CM | POA: Diagnosis not present

## 2020-11-05 DIAGNOSIS — N183 Chronic kidney disease, stage 3 unspecified: Secondary | ICD-10-CM | POA: Diagnosis not present

## 2020-11-05 DIAGNOSIS — I890 Lymphedema, not elsewhere classified: Secondary | ICD-10-CM | POA: Diagnosis not present

## 2020-11-05 DIAGNOSIS — J449 Chronic obstructive pulmonary disease, unspecified: Secondary | ICD-10-CM | POA: Diagnosis not present

## 2020-11-05 DIAGNOSIS — I130 Hypertensive heart and chronic kidney disease with heart failure and stage 1 through stage 4 chronic kidney disease, or unspecified chronic kidney disease: Secondary | ICD-10-CM | POA: Diagnosis not present

## 2020-11-05 DIAGNOSIS — N1831 Chronic kidney disease, stage 3a: Secondary | ICD-10-CM | POA: Diagnosis not present

## 2020-11-05 DIAGNOSIS — R600 Localized edema: Secondary | ICD-10-CM | POA: Diagnosis not present

## 2020-11-05 DIAGNOSIS — M1030 Gout due to renal impairment, unspecified site: Secondary | ICD-10-CM | POA: Diagnosis not present

## 2020-11-05 DIAGNOSIS — N390 Urinary tract infection, site not specified: Secondary | ICD-10-CM | POA: Diagnosis not present

## 2020-11-05 DIAGNOSIS — I872 Venous insufficiency (chronic) (peripheral): Secondary | ICD-10-CM | POA: Diagnosis not present

## 2020-11-09 DIAGNOSIS — M1030 Gout due to renal impairment, unspecified site: Secondary | ICD-10-CM | POA: Diagnosis not present

## 2020-11-09 DIAGNOSIS — I890 Lymphedema, not elsewhere classified: Secondary | ICD-10-CM | POA: Diagnosis not present

## 2020-11-09 DIAGNOSIS — N183 Chronic kidney disease, stage 3 unspecified: Secondary | ICD-10-CM | POA: Diagnosis not present

## 2020-11-09 DIAGNOSIS — L97811 Non-pressure chronic ulcer of other part of right lower leg limited to breakdown of skin: Secondary | ICD-10-CM | POA: Diagnosis not present

## 2020-11-09 DIAGNOSIS — I5032 Chronic diastolic (congestive) heart failure: Secondary | ICD-10-CM | POA: Diagnosis not present

## 2020-11-09 DIAGNOSIS — I130 Hypertensive heart and chronic kidney disease with heart failure and stage 1 through stage 4 chronic kidney disease, or unspecified chronic kidney disease: Secondary | ICD-10-CM | POA: Diagnosis not present

## 2020-11-09 DIAGNOSIS — L97821 Non-pressure chronic ulcer of other part of left lower leg limited to breakdown of skin: Secondary | ICD-10-CM | POA: Diagnosis not present

## 2020-11-09 DIAGNOSIS — I872 Venous insufficiency (chronic) (peripheral): Secondary | ICD-10-CM | POA: Diagnosis not present

## 2020-11-09 DIAGNOSIS — J449 Chronic obstructive pulmonary disease, unspecified: Secondary | ICD-10-CM | POA: Diagnosis not present

## 2020-11-13 DIAGNOSIS — L97812 Non-pressure chronic ulcer of other part of right lower leg with fat layer exposed: Secondary | ICD-10-CM | POA: Diagnosis not present

## 2020-11-13 DIAGNOSIS — L97822 Non-pressure chronic ulcer of other part of left lower leg with fat layer exposed: Secondary | ICD-10-CM | POA: Diagnosis not present

## 2020-11-13 DIAGNOSIS — I5032 Chronic diastolic (congestive) heart failure: Secondary | ICD-10-CM | POA: Diagnosis not present

## 2020-11-13 DIAGNOSIS — M1030 Gout due to renal impairment, unspecified site: Secondary | ICD-10-CM | POA: Diagnosis not present

## 2020-11-13 DIAGNOSIS — N183 Chronic kidney disease, stage 3 unspecified: Secondary | ICD-10-CM | POA: Diagnosis not present

## 2020-11-13 DIAGNOSIS — I872 Venous insufficiency (chronic) (peripheral): Secondary | ICD-10-CM | POA: Diagnosis not present

## 2020-11-13 DIAGNOSIS — J449 Chronic obstructive pulmonary disease, unspecified: Secondary | ICD-10-CM | POA: Diagnosis not present

## 2020-11-13 DIAGNOSIS — I890 Lymphedema, not elsewhere classified: Secondary | ICD-10-CM | POA: Diagnosis not present

## 2020-11-13 DIAGNOSIS — I130 Hypertensive heart and chronic kidney disease with heart failure and stage 1 through stage 4 chronic kidney disease, or unspecified chronic kidney disease: Secondary | ICD-10-CM | POA: Diagnosis not present

## 2020-11-15 DIAGNOSIS — L03119 Cellulitis of unspecified part of limb: Secondary | ICD-10-CM | POA: Diagnosis not present

## 2020-11-15 DIAGNOSIS — I890 Lymphedema, not elsewhere classified: Secondary | ICD-10-CM | POA: Diagnosis not present

## 2020-11-16 ENCOUNTER — Telehealth (INDEPENDENT_AMBULATORY_CARE_PROVIDER_SITE_OTHER): Payer: Self-pay

## 2020-11-16 DIAGNOSIS — L97812 Non-pressure chronic ulcer of other part of right lower leg with fat layer exposed: Secondary | ICD-10-CM | POA: Diagnosis not present

## 2020-11-16 DIAGNOSIS — I890 Lymphedema, not elsewhere classified: Secondary | ICD-10-CM | POA: Diagnosis not present

## 2020-11-16 DIAGNOSIS — N183 Chronic kidney disease, stage 3 unspecified: Secondary | ICD-10-CM | POA: Diagnosis not present

## 2020-11-16 DIAGNOSIS — M1030 Gout due to renal impairment, unspecified site: Secondary | ICD-10-CM | POA: Diagnosis not present

## 2020-11-16 DIAGNOSIS — I5032 Chronic diastolic (congestive) heart failure: Secondary | ICD-10-CM | POA: Diagnosis not present

## 2020-11-16 DIAGNOSIS — L97822 Non-pressure chronic ulcer of other part of left lower leg with fat layer exposed: Secondary | ICD-10-CM | POA: Diagnosis not present

## 2020-11-16 DIAGNOSIS — I130 Hypertensive heart and chronic kidney disease with heart failure and stage 1 through stage 4 chronic kidney disease, or unspecified chronic kidney disease: Secondary | ICD-10-CM | POA: Diagnosis not present

## 2020-11-16 DIAGNOSIS — J449 Chronic obstructive pulmonary disease, unspecified: Secondary | ICD-10-CM | POA: Diagnosis not present

## 2020-11-16 DIAGNOSIS — I872 Venous insufficiency (chronic) (peripheral): Secondary | ICD-10-CM | POA: Diagnosis not present

## 2020-11-16 NOTE — Telephone Encounter (Signed)
Pt called and want to know about her mom getting new compression pumps . I spoke with her daughter an she need to come in for an appointment to update the records to send to [facility] for  New Compression pumps please schedule pt to be seen .

## 2020-11-19 DIAGNOSIS — I890 Lymphedema, not elsewhere classified: Secondary | ICD-10-CM | POA: Diagnosis not present

## 2020-11-19 DIAGNOSIS — I130 Hypertensive heart and chronic kidney disease with heart failure and stage 1 through stage 4 chronic kidney disease, or unspecified chronic kidney disease: Secondary | ICD-10-CM | POA: Diagnosis not present

## 2020-11-19 DIAGNOSIS — N183 Chronic kidney disease, stage 3 unspecified: Secondary | ICD-10-CM | POA: Diagnosis not present

## 2020-11-19 DIAGNOSIS — I872 Venous insufficiency (chronic) (peripheral): Secondary | ICD-10-CM | POA: Diagnosis not present

## 2020-11-19 DIAGNOSIS — L97822 Non-pressure chronic ulcer of other part of left lower leg with fat layer exposed: Secondary | ICD-10-CM | POA: Diagnosis not present

## 2020-11-19 DIAGNOSIS — I5032 Chronic diastolic (congestive) heart failure: Secondary | ICD-10-CM | POA: Diagnosis not present

## 2020-11-19 DIAGNOSIS — J449 Chronic obstructive pulmonary disease, unspecified: Secondary | ICD-10-CM | POA: Diagnosis not present

## 2020-11-19 DIAGNOSIS — L97812 Non-pressure chronic ulcer of other part of right lower leg with fat layer exposed: Secondary | ICD-10-CM | POA: Diagnosis not present

## 2020-11-19 DIAGNOSIS — M1030 Gout due to renal impairment, unspecified site: Secondary | ICD-10-CM | POA: Diagnosis not present

## 2020-11-19 NOTE — Telephone Encounter (Signed)
Called patients daughter to schedule

## 2020-11-20 ENCOUNTER — Ambulatory Visit (INDEPENDENT_AMBULATORY_CARE_PROVIDER_SITE_OTHER): Payer: Medicare HMO | Admitting: Nurse Practitioner

## 2020-11-22 DIAGNOSIS — L97822 Non-pressure chronic ulcer of other part of left lower leg with fat layer exposed: Secondary | ICD-10-CM | POA: Diagnosis not present

## 2020-11-22 DIAGNOSIS — L97812 Non-pressure chronic ulcer of other part of right lower leg with fat layer exposed: Secondary | ICD-10-CM | POA: Diagnosis not present

## 2020-11-22 DIAGNOSIS — Z6841 Body Mass Index (BMI) 40.0 and over, adult: Secondary | ICD-10-CM | POA: Diagnosis not present

## 2020-11-22 DIAGNOSIS — B951 Streptococcus, group B, as the cause of diseases classified elsewhere: Secondary | ICD-10-CM | POA: Diagnosis not present

## 2020-11-22 DIAGNOSIS — I130 Hypertensive heart and chronic kidney disease with heart failure and stage 1 through stage 4 chronic kidney disease, or unspecified chronic kidney disease: Secondary | ICD-10-CM | POA: Diagnosis not present

## 2020-11-22 DIAGNOSIS — L03115 Cellulitis of right lower limb: Secondary | ICD-10-CM | POA: Diagnosis not present

## 2020-11-22 DIAGNOSIS — N183 Chronic kidney disease, stage 3 unspecified: Secondary | ICD-10-CM | POA: Diagnosis not present

## 2020-11-22 DIAGNOSIS — I5032 Chronic diastolic (congestive) heart failure: Secondary | ICD-10-CM | POA: Diagnosis not present

## 2020-11-22 DIAGNOSIS — R7881 Bacteremia: Secondary | ICD-10-CM | POA: Diagnosis not present

## 2020-11-22 DIAGNOSIS — I872 Venous insufficiency (chronic) (peripheral): Secondary | ICD-10-CM | POA: Diagnosis not present

## 2020-11-22 DIAGNOSIS — M1030 Gout due to renal impairment, unspecified site: Secondary | ICD-10-CM | POA: Diagnosis not present

## 2020-11-22 DIAGNOSIS — I890 Lymphedema, not elsewhere classified: Secondary | ICD-10-CM | POA: Diagnosis not present

## 2020-11-22 DIAGNOSIS — J449 Chronic obstructive pulmonary disease, unspecified: Secondary | ICD-10-CM | POA: Diagnosis not present

## 2020-11-22 DIAGNOSIS — L03116 Cellulitis of left lower limb: Secondary | ICD-10-CM | POA: Diagnosis not present

## 2020-11-27 DIAGNOSIS — N183 Chronic kidney disease, stage 3 unspecified: Secondary | ICD-10-CM | POA: Diagnosis not present

## 2020-11-27 DIAGNOSIS — I890 Lymphedema, not elsewhere classified: Secondary | ICD-10-CM | POA: Diagnosis not present

## 2020-11-27 DIAGNOSIS — L97822 Non-pressure chronic ulcer of other part of left lower leg with fat layer exposed: Secondary | ICD-10-CM | POA: Diagnosis not present

## 2020-11-27 DIAGNOSIS — I872 Venous insufficiency (chronic) (peripheral): Secondary | ICD-10-CM | POA: Diagnosis not present

## 2020-11-27 DIAGNOSIS — L97812 Non-pressure chronic ulcer of other part of right lower leg with fat layer exposed: Secondary | ICD-10-CM | POA: Diagnosis not present

## 2020-11-27 DIAGNOSIS — J449 Chronic obstructive pulmonary disease, unspecified: Secondary | ICD-10-CM | POA: Diagnosis not present

## 2020-11-27 DIAGNOSIS — I5032 Chronic diastolic (congestive) heart failure: Secondary | ICD-10-CM | POA: Diagnosis not present

## 2020-11-27 DIAGNOSIS — M1030 Gout due to renal impairment, unspecified site: Secondary | ICD-10-CM | POA: Diagnosis not present

## 2020-11-27 DIAGNOSIS — I130 Hypertensive heart and chronic kidney disease with heart failure and stage 1 through stage 4 chronic kidney disease, or unspecified chronic kidney disease: Secondary | ICD-10-CM | POA: Diagnosis not present

## 2020-12-01 DIAGNOSIS — I872 Venous insufficiency (chronic) (peripheral): Secondary | ICD-10-CM | POA: Diagnosis not present

## 2020-12-01 DIAGNOSIS — I130 Hypertensive heart and chronic kidney disease with heart failure and stage 1 through stage 4 chronic kidney disease, or unspecified chronic kidney disease: Secondary | ICD-10-CM | POA: Diagnosis not present

## 2020-12-01 DIAGNOSIS — M1030 Gout due to renal impairment, unspecified site: Secondary | ICD-10-CM | POA: Diagnosis not present

## 2020-12-01 DIAGNOSIS — N183 Chronic kidney disease, stage 3 unspecified: Secondary | ICD-10-CM | POA: Diagnosis not present

## 2020-12-01 DIAGNOSIS — L97822 Non-pressure chronic ulcer of other part of left lower leg with fat layer exposed: Secondary | ICD-10-CM | POA: Diagnosis not present

## 2020-12-01 DIAGNOSIS — J449 Chronic obstructive pulmonary disease, unspecified: Secondary | ICD-10-CM | POA: Diagnosis not present

## 2020-12-01 DIAGNOSIS — L97812 Non-pressure chronic ulcer of other part of right lower leg with fat layer exposed: Secondary | ICD-10-CM | POA: Diagnosis not present

## 2020-12-01 DIAGNOSIS — I5032 Chronic diastolic (congestive) heart failure: Secondary | ICD-10-CM | POA: Diagnosis not present

## 2020-12-01 DIAGNOSIS — I890 Lymphedema, not elsewhere classified: Secondary | ICD-10-CM | POA: Diagnosis not present

## 2020-12-05 DIAGNOSIS — E892 Postprocedural hypoparathyroidism: Secondary | ICD-10-CM | POA: Diagnosis not present

## 2020-12-05 DIAGNOSIS — L03119 Cellulitis of unspecified part of limb: Secondary | ICD-10-CM | POA: Diagnosis not present

## 2020-12-05 DIAGNOSIS — A4902 Methicillin resistant Staphylococcus aureus infection, unspecified site: Secondary | ICD-10-CM | POA: Diagnosis not present

## 2020-12-05 DIAGNOSIS — I890 Lymphedema, not elsewhere classified: Secondary | ICD-10-CM | POA: Diagnosis not present

## 2020-12-08 DIAGNOSIS — I872 Venous insufficiency (chronic) (peripheral): Secondary | ICD-10-CM | POA: Diagnosis not present

## 2020-12-08 DIAGNOSIS — L97822 Non-pressure chronic ulcer of other part of left lower leg with fat layer exposed: Secondary | ICD-10-CM | POA: Diagnosis not present

## 2020-12-08 DIAGNOSIS — M1030 Gout due to renal impairment, unspecified site: Secondary | ICD-10-CM | POA: Diagnosis not present

## 2020-12-08 DIAGNOSIS — L97812 Non-pressure chronic ulcer of other part of right lower leg with fat layer exposed: Secondary | ICD-10-CM | POA: Diagnosis not present

## 2020-12-08 DIAGNOSIS — I890 Lymphedema, not elsewhere classified: Secondary | ICD-10-CM | POA: Diagnosis not present

## 2020-12-08 DIAGNOSIS — J449 Chronic obstructive pulmonary disease, unspecified: Secondary | ICD-10-CM | POA: Diagnosis not present

## 2020-12-08 DIAGNOSIS — I130 Hypertensive heart and chronic kidney disease with heart failure and stage 1 through stage 4 chronic kidney disease, or unspecified chronic kidney disease: Secondary | ICD-10-CM | POA: Diagnosis not present

## 2020-12-08 DIAGNOSIS — N183 Chronic kidney disease, stage 3 unspecified: Secondary | ICD-10-CM | POA: Diagnosis not present

## 2020-12-08 DIAGNOSIS — I5032 Chronic diastolic (congestive) heart failure: Secondary | ICD-10-CM | POA: Diagnosis not present

## 2020-12-10 DIAGNOSIS — I130 Hypertensive heart and chronic kidney disease with heart failure and stage 1 through stage 4 chronic kidney disease, or unspecified chronic kidney disease: Secondary | ICD-10-CM | POA: Diagnosis not present

## 2020-12-10 DIAGNOSIS — L97822 Non-pressure chronic ulcer of other part of left lower leg with fat layer exposed: Secondary | ICD-10-CM | POA: Diagnosis not present

## 2020-12-10 DIAGNOSIS — I872 Venous insufficiency (chronic) (peripheral): Secondary | ICD-10-CM | POA: Diagnosis not present

## 2020-12-10 DIAGNOSIS — L97812 Non-pressure chronic ulcer of other part of right lower leg with fat layer exposed: Secondary | ICD-10-CM | POA: Diagnosis not present

## 2020-12-10 DIAGNOSIS — I5032 Chronic diastolic (congestive) heart failure: Secondary | ICD-10-CM | POA: Diagnosis not present

## 2020-12-10 DIAGNOSIS — I890 Lymphedema, not elsewhere classified: Secondary | ICD-10-CM | POA: Diagnosis not present

## 2020-12-10 DIAGNOSIS — M1030 Gout due to renal impairment, unspecified site: Secondary | ICD-10-CM | POA: Diagnosis not present

## 2020-12-10 DIAGNOSIS — N183 Chronic kidney disease, stage 3 unspecified: Secondary | ICD-10-CM | POA: Diagnosis not present

## 2020-12-10 DIAGNOSIS — J449 Chronic obstructive pulmonary disease, unspecified: Secondary | ICD-10-CM | POA: Diagnosis not present

## 2020-12-12 ENCOUNTER — Emergency Department
Admission: EM | Admit: 2020-12-12 | Discharge: 2020-12-12 | Disposition: A | Discharge status: Home / Self Care | Payer: Medicare HMO | Attending: Emergency Medicine | Admitting: Emergency Medicine

## 2020-12-12 ENCOUNTER — Other Ambulatory Visit: Payer: Self-pay

## 2020-12-12 DIAGNOSIS — R0902 Hypoxemia: Secondary | ICD-10-CM | POA: Diagnosis not present

## 2020-12-12 DIAGNOSIS — M79605 Pain in left leg: Secondary | ICD-10-CM | POA: Diagnosis not present

## 2020-12-12 DIAGNOSIS — I4891 Unspecified atrial fibrillation: Secondary | ICD-10-CM | POA: Diagnosis not present

## 2020-12-12 DIAGNOSIS — I130 Hypertensive heart and chronic kidney disease with heart failure and stage 1 through stage 4 chronic kidney disease, or unspecified chronic kidney disease: Secondary | ICD-10-CM | POA: Insufficient documentation

## 2020-12-12 DIAGNOSIS — J449 Chronic obstructive pulmonary disease, unspecified: Secondary | ICD-10-CM | POA: Insufficient documentation

## 2020-12-12 DIAGNOSIS — I5032 Chronic diastolic (congestive) heart failure: Secondary | ICD-10-CM | POA: Diagnosis not present

## 2020-12-12 DIAGNOSIS — Z79899 Other long term (current) drug therapy: Secondary | ICD-10-CM | POA: Diagnosis not present

## 2020-12-12 DIAGNOSIS — L97822 Non-pressure chronic ulcer of other part of left lower leg with fat layer exposed: Secondary | ICD-10-CM | POA: Diagnosis not present

## 2020-12-12 DIAGNOSIS — E1122 Type 2 diabetes mellitus with diabetic chronic kidney disease: Secondary | ICD-10-CM | POA: Diagnosis not present

## 2020-12-12 DIAGNOSIS — L03116 Cellulitis of left lower limb: Secondary | ICD-10-CM | POA: Diagnosis not present

## 2020-12-12 DIAGNOSIS — Z7901 Long term (current) use of anticoagulants: Secondary | ICD-10-CM | POA: Insufficient documentation

## 2020-12-12 DIAGNOSIS — I5033 Acute on chronic diastolic (congestive) heart failure: Secondary | ICD-10-CM | POA: Insufficient documentation

## 2020-12-12 DIAGNOSIS — L97812 Non-pressure chronic ulcer of other part of right lower leg with fat layer exposed: Secondary | ICD-10-CM | POA: Diagnosis not present

## 2020-12-12 DIAGNOSIS — Z7722 Contact with and (suspected) exposure to environmental tobacco smoke (acute) (chronic): Secondary | ICD-10-CM | POA: Insufficient documentation

## 2020-12-12 DIAGNOSIS — R000 Tachycardia, unspecified: Secondary | ICD-10-CM | POA: Diagnosis not present

## 2020-12-12 DIAGNOSIS — R5381 Other malaise: Secondary | ICD-10-CM | POA: Diagnosis not present

## 2020-12-12 DIAGNOSIS — N183 Chronic kidney disease, stage 3 unspecified: Secondary | ICD-10-CM | POA: Diagnosis not present

## 2020-12-12 DIAGNOSIS — E039 Hypothyroidism, unspecified: Secondary | ICD-10-CM | POA: Diagnosis not present

## 2020-12-12 DIAGNOSIS — J45909 Unspecified asthma, uncomplicated: Secondary | ICD-10-CM | POA: Insufficient documentation

## 2020-12-12 DIAGNOSIS — I10 Essential (primary) hypertension: Secondary | ICD-10-CM | POA: Diagnosis not present

## 2020-12-12 DIAGNOSIS — I872 Venous insufficiency (chronic) (peripheral): Secondary | ICD-10-CM | POA: Diagnosis not present

## 2020-12-12 DIAGNOSIS — M1030 Gout due to renal impairment, unspecified site: Secondary | ICD-10-CM | POA: Diagnosis not present

## 2020-12-12 DIAGNOSIS — I890 Lymphedema, not elsewhere classified: Secondary | ICD-10-CM | POA: Diagnosis not present

## 2020-12-12 NOTE — ED Provider Notes (Signed)
Emergency Medicine Provider Triage Evaluation Note  [name] , a 87 y.o. female  was evaluated in triage.  Pt complains of bilateral leg pain.  Patient reports a current open weeping wound to the left medial ankle that was cultured a few weeks ago and confirmed to be MRSA.  Patient was a started on cephalexin which she is taken as directed.  She denies any fevers, chills, or sweats.  She does also endorse chronic pain to the thighs bilaterally.  She denies any benefit with antibiotic that she is been taking for the last month.  Review of Systems  Positive: LLE cellulitis Negative: FCS  Physical Exam  BP (!) 138/44 (BP Location: Right Arm)   Pulse 61   Resp 18   Ht 5\' 5"  (1.651 m)   Wt 124.7 kg   SpO2 97%   BMI 45.76 kg/m  Gen:   Awake, no distress  NAD Resp:  Normal effort CTA MSK:   Moves extremities without difficulty. BLE edema.  Weeping wound to the distal left leg. Other:  CVS: RRR  Medical Decision Making  Medically screening exam initiated at 4:30 PM.  Appropriate orders placed.  [name] was informed that the remainder of the evaluation will be completed by another provider, this initial triage assessment does not replace that evaluation, and the importance of remaining in the ED until their evaluation is complete.  Patient presented to the ED for evaluation of left leg pain and swelling with weeping wound confirmed being MRSA.  Patient denies any benefit with antibiotics currently taken.   Melvenia Needles, PA-C 12/12/20 1637    Nena Polio, MD 12/12/20 [phone]

## 2020-12-12 NOTE — ED Triage Notes (Signed)
Pt arrives via ems from home, pt has a wound to her left ankle and states that it was cultured a few weeks ago and told that it was mrsa, pt states that she also has it in her privates, pt states that she is taking an antibiotic that starts with a C

## 2020-12-12 NOTE — ED Triage Notes (Signed)
First nurse note: pt comes ems with recent MRSA infection in leg. States it's not getting better. Wrapped. Has finished a whole month of antibiotics. Pt c/o pain today.

## 2020-12-12 NOTE — ED Provider Notes (Signed)
[facility] Emergency Department Provider Note   ____________________________________________    I have reviewed the triage vital signs and the nursing notes.   HISTORY  Chief Complaint Leg Pain and Wound Infection     HPI [name] is a 87 y.o. female who presents for evaluation for possible leg cellulitis.  Patient reports she sees Dr. Ola Spurr of infectious disease, she is chronic issues with her legs bilaterally related to edema.  Had recent swab which demonstrated MRSA, was started on doxycycline by Dr. Ola Spurr, she took her first dose this morning.  Home nurse was concerned and referred her to the emergency department today.  She denies fevers or chills.  Past Medical History:  Diagnosis Date   Arthritis    Asthma    CHF (congestive heart failure) (HCC)    COPD (chronic obstructive pulmonary disease) (HCC)    Cough    GERD (gastroesophageal reflux disease)    Gout    Hiatal hernia    Hypertension    Hypothyroidism    Neuropathic pain of right lower extremity    Peripheral vascular disease ([place])    "poor circulation"   Seasonal allergies    Shortness of breath dyspnea    Sleep apnea    uses CPAP (sometimes)    Patient Active Problem List   Diagnosis Date Noted   Allergic rhinitis 08/10/2020   Peripheral neuropathy 08/10/2020   Acute delirium    Hypomagnesemia    Sepsis due to group B Streptococcus without acute organ dysfunction (HCC)    Acute on chronic diastolic CHF (congestive heart failure) (HCC)    Atrial fibrillation, chronic ([place])    Depression    Sepsis secondary to UTI ([place]) 07/05/2020   COPD with acute exacerbation (HCC)    Hypothyroidism    Hypertension    CKD (chronic kidney disease) stage 3, GFR 30-59 ml/min (HCC)    Chronic kidney disease 06/21/2020   Secondary hyperaldosteronism ([place]) 06/16/2020   Stasis dermatitis of both legs 07/28/2017   Pruritic rash 07/03/2017   Gait instability 05/27/2017    CHF (congestive heart failure) ([place]) 03/27/2017   Lower extremity ulceration, left, limited to breakdown of skin ([place]) 03/27/2017   High risk medication use 03/12/2017   Primary hyperparathyroidism ([place]) 03/12/2017   Impaired glucose tolerance 03/12/2017   Noncompliance 03/12/2017   Parathyroid adenoma 03/12/2017   Chronic renal insufficiency, stage 3 (moderate) ([place]) 03/12/2017   Mild reactive airways disease 03/08/2016   Influenza 03/06/2016   Atrial fibrillation ([place]) 03/06/2016   Morbid obesity with BMI of 45.0-49.9, adult ([place]) 02/18/2016   OSA (obstructive sleep apnea) 02/18/2016   CKD (chronic kidney disease), stage II 02/12/2016   Swelling of limb 02/12/2016   Pain in toes of both feet 01/14/2016   Varicose veins of both lower extremities with pain 01/14/2016   Lymphedema 01/14/2016   Longstanding persistent atrial fibrillation ([place]) 10/04/2015   Atrophic vaginitis 12/13/2014   Hypercalcemia 12/13/2014   Hyperparathyroidism ([place]) 12/13/2014   Chronic UTI (urinary tract infection) 11/29/2014   Hyperlipidemia 01/03/2014   Lymphedema of both lower extremities 12/08/2013   SOB (shortness of breath) 12/08/2013   Anxiety 06/15/2013   GERD (gastroesophageal reflux disease) 06/15/2013   Osteoarthritis 06/15/2013   COPD (chronic obstructive pulmonary disease) ([place]) 06/15/2013   Essential hypertension 06/15/2013   Incomplete emptying of bladder 05/25/2013   Mixed incontinence 62/13/0865   Renal colic 78/46/9629   Renal cyst, acquired 05/25/2013   Sciatica 05/25/2013   Chronic cystitis  05/25/2013    Past Surgical History:  Procedure Laterality Date   ANKLE ARTHROSCOPY     BACK SURGERY     L4-L5 Decompression   CARDIAC CATHETERIZATION  1995   CATARACT EXTRACTION W/PHACO Right 07/19/2014   Procedure: CATARACT EXTRACTION PHACO AND INTRAOCULAR LENS PLACEMENT ([facility]);  Surgeon: Leandrew Koyanagi, MD;  Location: [facility];  Service: Ophthalmology;  Laterality: Right;    CATARACT EXTRACTION W/PHACO Left 08/23/2014   Procedure: CATARACT EXTRACTION PHACO AND INTRAOCULAR LENS PLACEMENT ([facility]);  Surgeon: Leandrew Koyanagi, MD;  Location: [facility];  Service: Ophthalmology;  Laterality: Left;   CHOLECYSTECTOMY     COLONOSCOPY     PARATHYROIDECTOMY  2003   TONSILLECTOMY     UVULOPALATOPHARYNGOPLASTY      Prior to Admission medications   Medication Sig Start Date End Date Taking? Authorizing Provider  acetaminophen (TYLENOL) 500 MG tablet Take 500-1,000 mg by mouth every 6 (six) hours as needed for mild pain or moderate pain.    [provider]  apixaban (ELIQUIS) 5 MG TABS tablet Take 5 mg by mouth 2 (two) times daily.    [provider]  DULoxetine (CYMBALTA) 60 MG capsule Take 60 mg by mouth daily. 05/04/20   [provider]  furosemide (LASIX) 20 MG tablet Take 1 tablet (20 mg total) by mouth 2 (two) times daily. 07/10/20   Loletha Grayer, MD  ipratropium-albuterol (DUONEB) 0.5-2.5 (3) MG/3ML SOLN Take 3 mLs by nebulization every 6 (six) hours as needed (shortness of breath). 07/10/20   Loletha Grayer, MD  levothyroxine (SYNTHROID) 200 MCG tablet Take 200 mcg by mouth daily. 06/19/20   [provider]  metoprolol succinate (TOPROL-XL) 25 MG 24 hr tablet Take 0.5 tablets (12.5 mg total) by mouth at bedtime. 07/10/20   Loletha Grayer, MD  montelukast (SINGULAIR) 10 MG tablet Take 10 mg by mouth daily.    [provider]  Multiple Vitamins-Minerals (PRESERVISION AREDS 2+MULTI VIT) CAPS Take 1 capsule by mouth as directed.    [provider]  traZODone (DESYREL) 50 MG tablet Take 1 tablet (50 mg total) by mouth at bedtime as needed for sleep. 07/10/20   Loletha Grayer, MD  VENTOLIN HFA 108 (90 Base) MCG/ACT inhaler Inhale 2 puffs into the lungs every 6 (six) hours as needed for wheezing or shortness of breath.    [provider]  vitamin B-12 (CYANOCOBALAMIN) 500 MCG tablet Take 500 mcg by  mouth daily.    [provider]     Allergies Aspirin, Prednisone, Sulfa antibiotics, and Sulfacetamide sodium  Family History  Problem Relation Age of Onset   Cancer Mother    Hyperlipidemia Son    Diabetes Son    Cancer Maternal Grandmother     Social History Social History   Tobacco Use   Smoking status: Passive Smoke Exposure - Never Smoker   Smokeless tobacco: Never  Substance Use Topics   Alcohol use: No   Drug use: No    Review of Systems  Constitutional: No fever/chills Eyes: No visual changes.  ENT: No sore throat. Cardiovascular: Denies chest pain. Respiratory: Denies shortness of breath. Gastrointestinal: No abdominal pain.  No nausea, no vomiting.   Genitourinary: Negative for dysuria. Musculoskeletal: As above Skin: As above Neurological: Negative for headaches or weakness   ____________________________________________   PHYSICAL EXAM:  VITAL SIGNS: ED Triage Vitals  Enc Vitals Group     BP 12/12/20 1618 (!) 138/44     Pulse Rate 12/12/20 1618 61  Resp 12/12/20 1618 18     Temp --      Temp src --      SpO2 12/12/20 1618 97 %     Weight 12/12/20 1619 124.7 kg (275 lb)     Height 12/12/20 1619 1.651 m (5\' 5" )     Head Circumference --      Peak Flow --      Pain Score 12/12/20 1618 6     Pain Loc --      Pain Edu? --      Excl. in [place]? --     Constitutional: Alert and oriented. No acute distress. Pleasant and interactive  Nose: No congestion/rhinnorhea.  Cardiovascular: Normal rate, regular rhythm. Grossly normal heart sounds.  Good peripheral circulation. Respiratory: Normal respiratory effort.  No retractions. Lungs CTAB. Gastrointestinal: Soft and nontender. No distention.  No CVA tenderness. Genitourinary: deferred Musculoskeletal: Lower extremities with bilateral 2+ edema, suspect venous insufficiency on medial aspect of both legs she has some chronic discoloration, on the medial aspect of her left leg she does have a  skin break, very mild erythema noted, no fluctuance Neurologic:  Normal speech and language. No gross focal neurologic deficits are appreciated.  Skin:  Skin is warm, dry, see above Psychiatric: Mood and affect are normal. Speech and behavior are normal.  ____________________________________________   LABS (all labs ordered are listed, but only abnormal results are displayed)  Labs Reviewed - No data to display ____________________________________________  EKG   ____________________________________________  RADIOLOGY   ____________________________________________   PROCEDURES  Procedure(s) performed: No  Procedures   Critical Care performed: No ____________________________________________   INITIAL IMPRESSION / ASSESSMENT AND PLAN / ED COURSE  Pertinent labs & imaging results that were available during my care of the patient were reviewed by me and considered in my medical decision making (see chart for details).   Patient well-appearing and in no acute distress, she has chronic edema of the lower extremities which is led to weeping and frequent infections in the past.  She reports that her symptoms are not particularly bad today but her home nurse was concerned.  Her exam overall is consistent with a very early cellulitis, doxycycline is the appropriate treatment no indication for IV antibiotics or admission at this time.  She will follow-up closely with her infectious disease doctor     ____________________________________________   FINAL CLINICAL IMPRESSION(S) / ED DIAGNOSES  Final diagnoses:  Cellulitis of left lower extremity        Note:  This document was prepared using Dragon voice recognition software and may include unintentional dictation errors.    Lavonia Drafts, MD 12/12/20 2007

## 2020-12-18 DIAGNOSIS — N183 Chronic kidney disease, stage 3 unspecified: Secondary | ICD-10-CM | POA: Diagnosis not present

## 2020-12-18 DIAGNOSIS — I130 Hypertensive heart and chronic kidney disease with heart failure and stage 1 through stage 4 chronic kidney disease, or unspecified chronic kidney disease: Secondary | ICD-10-CM | POA: Diagnosis not present

## 2020-12-18 DIAGNOSIS — I5032 Chronic diastolic (congestive) heart failure: Secondary | ICD-10-CM | POA: Diagnosis not present

## 2020-12-18 DIAGNOSIS — I890 Lymphedema, not elsewhere classified: Secondary | ICD-10-CM | POA: Diagnosis not present

## 2020-12-18 DIAGNOSIS — L97812 Non-pressure chronic ulcer of other part of right lower leg with fat layer exposed: Secondary | ICD-10-CM | POA: Diagnosis not present

## 2020-12-18 DIAGNOSIS — I872 Venous insufficiency (chronic) (peripheral): Secondary | ICD-10-CM | POA: Diagnosis not present

## 2020-12-18 DIAGNOSIS — L97822 Non-pressure chronic ulcer of other part of left lower leg with fat layer exposed: Secondary | ICD-10-CM | POA: Diagnosis not present

## 2020-12-18 DIAGNOSIS — M1030 Gout due to renal impairment, unspecified site: Secondary | ICD-10-CM | POA: Diagnosis not present

## 2020-12-18 DIAGNOSIS — J449 Chronic obstructive pulmonary disease, unspecified: Secondary | ICD-10-CM | POA: Diagnosis not present

## 2020-12-22 DIAGNOSIS — M1030 Gout due to renal impairment, unspecified site: Secondary | ICD-10-CM | POA: Diagnosis not present

## 2020-12-22 DIAGNOSIS — L97822 Non-pressure chronic ulcer of other part of left lower leg with fat layer exposed: Secondary | ICD-10-CM | POA: Diagnosis not present

## 2020-12-22 DIAGNOSIS — J449 Chronic obstructive pulmonary disease, unspecified: Secondary | ICD-10-CM | POA: Diagnosis not present

## 2020-12-22 DIAGNOSIS — I872 Venous insufficiency (chronic) (peripheral): Secondary | ICD-10-CM | POA: Diagnosis not present

## 2020-12-22 DIAGNOSIS — I890 Lymphedema, not elsewhere classified: Secondary | ICD-10-CM | POA: Diagnosis not present

## 2020-12-22 DIAGNOSIS — I5032 Chronic diastolic (congestive) heart failure: Secondary | ICD-10-CM | POA: Diagnosis not present

## 2020-12-22 DIAGNOSIS — I130 Hypertensive heart and chronic kidney disease with heart failure and stage 1 through stage 4 chronic kidney disease, or unspecified chronic kidney disease: Secondary | ICD-10-CM | POA: Diagnosis not present

## 2020-12-22 DIAGNOSIS — L97812 Non-pressure chronic ulcer of other part of right lower leg with fat layer exposed: Secondary | ICD-10-CM | POA: Diagnosis not present

## 2020-12-22 DIAGNOSIS — N183 Chronic kidney disease, stage 3 unspecified: Secondary | ICD-10-CM | POA: Diagnosis not present

## 2020-12-24 DIAGNOSIS — Z792 Long term (current) use of antibiotics: Secondary | ICD-10-CM | POA: Diagnosis not present

## 2020-12-24 DIAGNOSIS — I890 Lymphedema, not elsewhere classified: Secondary | ICD-10-CM | POA: Diagnosis not present

## 2020-12-24 DIAGNOSIS — A4902 Methicillin resistant Staphylococcus aureus infection, unspecified site: Secondary | ICD-10-CM | POA: Diagnosis not present

## 2020-12-24 DIAGNOSIS — L03115 Cellulitis of right lower limb: Secondary | ICD-10-CM | POA: Diagnosis not present

## 2020-12-24 DIAGNOSIS — Z6841 Body Mass Index (BMI) 40.0 and over, adult: Secondary | ICD-10-CM | POA: Diagnosis not present

## 2020-12-24 DIAGNOSIS — L03119 Cellulitis of unspecified part of limb: Secondary | ICD-10-CM | POA: Diagnosis not present

## 2020-12-25 DIAGNOSIS — I130 Hypertensive heart and chronic kidney disease with heart failure and stage 1 through stage 4 chronic kidney disease, or unspecified chronic kidney disease: Secondary | ICD-10-CM | POA: Diagnosis not present

## 2020-12-25 DIAGNOSIS — J449 Chronic obstructive pulmonary disease, unspecified: Secondary | ICD-10-CM | POA: Diagnosis not present

## 2020-12-25 DIAGNOSIS — N183 Chronic kidney disease, stage 3 unspecified: Secondary | ICD-10-CM | POA: Diagnosis not present

## 2020-12-25 DIAGNOSIS — M1030 Gout due to renal impairment, unspecified site: Secondary | ICD-10-CM | POA: Diagnosis not present

## 2020-12-25 DIAGNOSIS — I5032 Chronic diastolic (congestive) heart failure: Secondary | ICD-10-CM | POA: Diagnosis not present

## 2020-12-25 DIAGNOSIS — I872 Venous insufficiency (chronic) (peripheral): Secondary | ICD-10-CM | POA: Diagnosis not present

## 2020-12-25 DIAGNOSIS — L97812 Non-pressure chronic ulcer of other part of right lower leg with fat layer exposed: Secondary | ICD-10-CM | POA: Diagnosis not present

## 2020-12-25 DIAGNOSIS — I890 Lymphedema, not elsewhere classified: Secondary | ICD-10-CM | POA: Diagnosis not present

## 2020-12-25 DIAGNOSIS — L97822 Non-pressure chronic ulcer of other part of left lower leg with fat layer exposed: Secondary | ICD-10-CM | POA: Diagnosis not present

## 2020-12-29 DIAGNOSIS — I890 Lymphedema, not elsewhere classified: Secondary | ICD-10-CM | POA: Diagnosis not present

## 2020-12-29 DIAGNOSIS — L97812 Non-pressure chronic ulcer of other part of right lower leg with fat layer exposed: Secondary | ICD-10-CM | POA: Diagnosis not present

## 2020-12-29 DIAGNOSIS — J449 Chronic obstructive pulmonary disease, unspecified: Secondary | ICD-10-CM | POA: Diagnosis not present

## 2020-12-29 DIAGNOSIS — I5032 Chronic diastolic (congestive) heart failure: Secondary | ICD-10-CM | POA: Diagnosis not present

## 2020-12-29 DIAGNOSIS — L97822 Non-pressure chronic ulcer of other part of left lower leg with fat layer exposed: Secondary | ICD-10-CM | POA: Diagnosis not present

## 2020-12-29 DIAGNOSIS — I872 Venous insufficiency (chronic) (peripheral): Secondary | ICD-10-CM | POA: Diagnosis not present

## 2020-12-29 DIAGNOSIS — I130 Hypertensive heart and chronic kidney disease with heart failure and stage 1 through stage 4 chronic kidney disease, or unspecified chronic kidney disease: Secondary | ICD-10-CM | POA: Diagnosis not present

## 2020-12-29 DIAGNOSIS — M1030 Gout due to renal impairment, unspecified site: Secondary | ICD-10-CM | POA: Diagnosis not present

## 2020-12-29 DIAGNOSIS — N183 Chronic kidney disease, stage 3 unspecified: Secondary | ICD-10-CM | POA: Diagnosis not present

## 2021-01-01 DIAGNOSIS — I130 Hypertensive heart and chronic kidney disease with heart failure and stage 1 through stage 4 chronic kidney disease, or unspecified chronic kidney disease: Secondary | ICD-10-CM | POA: Diagnosis not present

## 2021-01-01 DIAGNOSIS — I872 Venous insufficiency (chronic) (peripheral): Secondary | ICD-10-CM | POA: Diagnosis not present

## 2021-01-01 DIAGNOSIS — L97822 Non-pressure chronic ulcer of other part of left lower leg with fat layer exposed: Secondary | ICD-10-CM | POA: Diagnosis not present

## 2021-01-01 DIAGNOSIS — J449 Chronic obstructive pulmonary disease, unspecified: Secondary | ICD-10-CM | POA: Diagnosis not present

## 2021-01-01 DIAGNOSIS — I890 Lymphedema, not elsewhere classified: Secondary | ICD-10-CM | POA: Diagnosis not present

## 2021-01-01 DIAGNOSIS — L97812 Non-pressure chronic ulcer of other part of right lower leg with fat layer exposed: Secondary | ICD-10-CM | POA: Diagnosis not present

## 2021-01-01 DIAGNOSIS — M1030 Gout due to renal impairment, unspecified site: Secondary | ICD-10-CM | POA: Diagnosis not present

## 2021-01-01 DIAGNOSIS — N183 Chronic kidney disease, stage 3 unspecified: Secondary | ICD-10-CM | POA: Diagnosis not present

## 2021-01-01 DIAGNOSIS — I5032 Chronic diastolic (congestive) heart failure: Secondary | ICD-10-CM | POA: Diagnosis not present

## 2021-01-05 DIAGNOSIS — I5032 Chronic diastolic (congestive) heart failure: Secondary | ICD-10-CM | POA: Diagnosis not present

## 2021-01-05 DIAGNOSIS — I130 Hypertensive heart and chronic kidney disease with heart failure and stage 1 through stage 4 chronic kidney disease, or unspecified chronic kidney disease: Secondary | ICD-10-CM | POA: Diagnosis not present

## 2021-01-05 DIAGNOSIS — I890 Lymphedema, not elsewhere classified: Secondary | ICD-10-CM | POA: Diagnosis not present

## 2021-01-05 DIAGNOSIS — J449 Chronic obstructive pulmonary disease, unspecified: Secondary | ICD-10-CM | POA: Diagnosis not present

## 2021-01-05 DIAGNOSIS — I872 Venous insufficiency (chronic) (peripheral): Secondary | ICD-10-CM | POA: Diagnosis not present

## 2021-01-05 DIAGNOSIS — L97822 Non-pressure chronic ulcer of other part of left lower leg with fat layer exposed: Secondary | ICD-10-CM | POA: Diagnosis not present

## 2021-01-05 DIAGNOSIS — M1030 Gout due to renal impairment, unspecified site: Secondary | ICD-10-CM | POA: Diagnosis not present

## 2021-01-05 DIAGNOSIS — L97812 Non-pressure chronic ulcer of other part of right lower leg with fat layer exposed: Secondary | ICD-10-CM | POA: Diagnosis not present

## 2021-01-05 DIAGNOSIS — N183 Chronic kidney disease, stage 3 unspecified: Secondary | ICD-10-CM | POA: Diagnosis not present

## 2021-01-08 DIAGNOSIS — I890 Lymphedema, not elsewhere classified: Secondary | ICD-10-CM | POA: Diagnosis not present

## 2021-01-08 DIAGNOSIS — L97812 Non-pressure chronic ulcer of other part of right lower leg with fat layer exposed: Secondary | ICD-10-CM | POA: Diagnosis not present

## 2021-01-08 DIAGNOSIS — L97822 Non-pressure chronic ulcer of other part of left lower leg with fat layer exposed: Secondary | ICD-10-CM | POA: Diagnosis not present

## 2021-01-08 DIAGNOSIS — I5032 Chronic diastolic (congestive) heart failure: Secondary | ICD-10-CM | POA: Diagnosis not present

## 2021-01-08 DIAGNOSIS — I872 Venous insufficiency (chronic) (peripheral): Secondary | ICD-10-CM | POA: Diagnosis not present

## 2021-01-08 DIAGNOSIS — J449 Chronic obstructive pulmonary disease, unspecified: Secondary | ICD-10-CM | POA: Diagnosis not present

## 2021-01-08 DIAGNOSIS — N183 Chronic kidney disease, stage 3 unspecified: Secondary | ICD-10-CM | POA: Diagnosis not present

## 2021-01-08 DIAGNOSIS — I130 Hypertensive heart and chronic kidney disease with heart failure and stage 1 through stage 4 chronic kidney disease, or unspecified chronic kidney disease: Secondary | ICD-10-CM | POA: Diagnosis not present

## 2021-01-08 DIAGNOSIS — M1030 Gout due to renal impairment, unspecified site: Secondary | ICD-10-CM | POA: Diagnosis not present

## 2021-01-09 DIAGNOSIS — G4733 Obstructive sleep apnea (adult) (pediatric): Secondary | ICD-10-CM | POA: Diagnosis not present

## 2021-01-09 DIAGNOSIS — R0609 Other forms of dyspnea: Secondary | ICD-10-CM | POA: Diagnosis not present

## 2021-01-09 DIAGNOSIS — J439 Emphysema, unspecified: Secondary | ICD-10-CM | POA: Diagnosis not present

## 2021-01-12 DIAGNOSIS — I5032 Chronic diastolic (congestive) heart failure: Secondary | ICD-10-CM | POA: Diagnosis not present

## 2021-01-12 DIAGNOSIS — J449 Chronic obstructive pulmonary disease, unspecified: Secondary | ICD-10-CM | POA: Diagnosis not present

## 2021-01-12 DIAGNOSIS — N183 Chronic kidney disease, stage 3 unspecified: Secondary | ICD-10-CM | POA: Diagnosis not present

## 2021-01-12 DIAGNOSIS — I872 Venous insufficiency (chronic) (peripheral): Secondary | ICD-10-CM | POA: Diagnosis not present

## 2021-01-12 DIAGNOSIS — L97821 Non-pressure chronic ulcer of other part of left lower leg limited to breakdown of skin: Secondary | ICD-10-CM | POA: Diagnosis not present

## 2021-01-12 DIAGNOSIS — M1030 Gout due to renal impairment, unspecified site: Secondary | ICD-10-CM | POA: Diagnosis not present

## 2021-01-12 DIAGNOSIS — I890 Lymphedema, not elsewhere classified: Secondary | ICD-10-CM | POA: Diagnosis not present

## 2021-01-12 DIAGNOSIS — L97811 Non-pressure chronic ulcer of other part of right lower leg limited to breakdown of skin: Secondary | ICD-10-CM | POA: Diagnosis not present

## 2021-01-12 DIAGNOSIS — I130 Hypertensive heart and chronic kidney disease with heart failure and stage 1 through stage 4 chronic kidney disease, or unspecified chronic kidney disease: Secondary | ICD-10-CM | POA: Diagnosis not present

## 2021-01-15 DIAGNOSIS — N183 Chronic kidney disease, stage 3 unspecified: Secondary | ICD-10-CM | POA: Diagnosis not present

## 2021-01-15 DIAGNOSIS — L97821 Non-pressure chronic ulcer of other part of left lower leg limited to breakdown of skin: Secondary | ICD-10-CM | POA: Diagnosis not present

## 2021-01-15 DIAGNOSIS — L97811 Non-pressure chronic ulcer of other part of right lower leg limited to breakdown of skin: Secondary | ICD-10-CM | POA: Diagnosis not present

## 2021-01-15 DIAGNOSIS — I890 Lymphedema, not elsewhere classified: Secondary | ICD-10-CM | POA: Diagnosis not present

## 2021-01-15 DIAGNOSIS — M1030 Gout due to renal impairment, unspecified site: Secondary | ICD-10-CM | POA: Diagnosis not present

## 2021-01-15 DIAGNOSIS — I130 Hypertensive heart and chronic kidney disease with heart failure and stage 1 through stage 4 chronic kidney disease, or unspecified chronic kidney disease: Secondary | ICD-10-CM | POA: Diagnosis not present

## 2021-01-15 DIAGNOSIS — I5032 Chronic diastolic (congestive) heart failure: Secondary | ICD-10-CM | POA: Diagnosis not present

## 2021-01-15 DIAGNOSIS — J449 Chronic obstructive pulmonary disease, unspecified: Secondary | ICD-10-CM | POA: Diagnosis not present

## 2021-01-15 DIAGNOSIS — I872 Venous insufficiency (chronic) (peripheral): Secondary | ICD-10-CM | POA: Diagnosis not present

## 2021-01-19 DIAGNOSIS — J449 Chronic obstructive pulmonary disease, unspecified: Secondary | ICD-10-CM | POA: Diagnosis not present

## 2021-01-19 DIAGNOSIS — I872 Venous insufficiency (chronic) (peripheral): Secondary | ICD-10-CM | POA: Diagnosis not present

## 2021-01-19 DIAGNOSIS — I890 Lymphedema, not elsewhere classified: Secondary | ICD-10-CM | POA: Diagnosis not present

## 2021-01-19 DIAGNOSIS — N183 Chronic kidney disease, stage 3 unspecified: Secondary | ICD-10-CM | POA: Diagnosis not present

## 2021-01-19 DIAGNOSIS — L97811 Non-pressure chronic ulcer of other part of right lower leg limited to breakdown of skin: Secondary | ICD-10-CM | POA: Diagnosis not present

## 2021-01-19 DIAGNOSIS — I5032 Chronic diastolic (congestive) heart failure: Secondary | ICD-10-CM | POA: Diagnosis not present

## 2021-01-19 DIAGNOSIS — M1030 Gout due to renal impairment, unspecified site: Secondary | ICD-10-CM | POA: Diagnosis not present

## 2021-01-19 DIAGNOSIS — I130 Hypertensive heart and chronic kidney disease with heart failure and stage 1 through stage 4 chronic kidney disease, or unspecified chronic kidney disease: Secondary | ICD-10-CM | POA: Diagnosis not present

## 2021-01-19 DIAGNOSIS — L97821 Non-pressure chronic ulcer of other part of left lower leg limited to breakdown of skin: Secondary | ICD-10-CM | POA: Diagnosis not present

## 2021-01-21 DIAGNOSIS — I890 Lymphedema, not elsewhere classified: Secondary | ICD-10-CM | POA: Diagnosis not present

## 2021-01-21 DIAGNOSIS — Z6841 Body Mass Index (BMI) 40.0 and over, adult: Secondary | ICD-10-CM | POA: Diagnosis not present

## 2021-01-21 DIAGNOSIS — A4902 Methicillin resistant Staphylococcus aureus infection, unspecified site: Secondary | ICD-10-CM | POA: Diagnosis not present

## 2021-01-22 DIAGNOSIS — N183 Chronic kidney disease, stage 3 unspecified: Secondary | ICD-10-CM | POA: Diagnosis not present

## 2021-01-22 DIAGNOSIS — I130 Hypertensive heart and chronic kidney disease with heart failure and stage 1 through stage 4 chronic kidney disease, or unspecified chronic kidney disease: Secondary | ICD-10-CM | POA: Diagnosis not present

## 2021-01-22 DIAGNOSIS — M1030 Gout due to renal impairment, unspecified site: Secondary | ICD-10-CM | POA: Diagnosis not present

## 2021-01-22 DIAGNOSIS — I5032 Chronic diastolic (congestive) heart failure: Secondary | ICD-10-CM | POA: Diagnosis not present

## 2021-01-22 DIAGNOSIS — J449 Chronic obstructive pulmonary disease, unspecified: Secondary | ICD-10-CM | POA: Diagnosis not present

## 2021-01-22 DIAGNOSIS — I890 Lymphedema, not elsewhere classified: Secondary | ICD-10-CM | POA: Diagnosis not present

## 2021-01-22 DIAGNOSIS — L97821 Non-pressure chronic ulcer of other part of left lower leg limited to breakdown of skin: Secondary | ICD-10-CM | POA: Diagnosis not present

## 2021-01-22 DIAGNOSIS — L97811 Non-pressure chronic ulcer of other part of right lower leg limited to breakdown of skin: Secondary | ICD-10-CM | POA: Diagnosis not present

## 2021-01-22 DIAGNOSIS — I872 Venous insufficiency (chronic) (peripheral): Secondary | ICD-10-CM | POA: Diagnosis not present

## 2021-01-25 DIAGNOSIS — L97821 Non-pressure chronic ulcer of other part of left lower leg limited to breakdown of skin: Secondary | ICD-10-CM | POA: Diagnosis not present

## 2021-01-25 DIAGNOSIS — L97811 Non-pressure chronic ulcer of other part of right lower leg limited to breakdown of skin: Secondary | ICD-10-CM | POA: Diagnosis not present

## 2021-01-25 DIAGNOSIS — I130 Hypertensive heart and chronic kidney disease with heart failure and stage 1 through stage 4 chronic kidney disease, or unspecified chronic kidney disease: Secondary | ICD-10-CM | POA: Diagnosis not present

## 2021-01-25 DIAGNOSIS — I872 Venous insufficiency (chronic) (peripheral): Secondary | ICD-10-CM | POA: Diagnosis not present

## 2021-01-25 DIAGNOSIS — J449 Chronic obstructive pulmonary disease, unspecified: Secondary | ICD-10-CM | POA: Diagnosis not present

## 2021-01-25 DIAGNOSIS — I890 Lymphedema, not elsewhere classified: Secondary | ICD-10-CM | POA: Diagnosis not present

## 2021-01-25 DIAGNOSIS — M1030 Gout due to renal impairment, unspecified site: Secondary | ICD-10-CM | POA: Diagnosis not present

## 2021-01-25 DIAGNOSIS — I5032 Chronic diastolic (congestive) heart failure: Secondary | ICD-10-CM | POA: Diagnosis not present

## 2021-01-25 DIAGNOSIS — N183 Chronic kidney disease, stage 3 unspecified: Secondary | ICD-10-CM | POA: Diagnosis not present

## 2021-01-29 DIAGNOSIS — I890 Lymphedema, not elsewhere classified: Secondary | ICD-10-CM | POA: Diagnosis not present

## 2021-01-29 DIAGNOSIS — J449 Chronic obstructive pulmonary disease, unspecified: Secondary | ICD-10-CM | POA: Diagnosis not present

## 2021-01-29 DIAGNOSIS — I130 Hypertensive heart and chronic kidney disease with heart failure and stage 1 through stage 4 chronic kidney disease, or unspecified chronic kidney disease: Secondary | ICD-10-CM | POA: Diagnosis not present

## 2021-01-29 DIAGNOSIS — I872 Venous insufficiency (chronic) (peripheral): Secondary | ICD-10-CM | POA: Diagnosis not present

## 2021-01-29 DIAGNOSIS — I5032 Chronic diastolic (congestive) heart failure: Secondary | ICD-10-CM | POA: Diagnosis not present

## 2021-01-29 DIAGNOSIS — L97811 Non-pressure chronic ulcer of other part of right lower leg limited to breakdown of skin: Secondary | ICD-10-CM | POA: Diagnosis not present

## 2021-01-29 DIAGNOSIS — M1030 Gout due to renal impairment, unspecified site: Secondary | ICD-10-CM | POA: Diagnosis not present

## 2021-01-29 DIAGNOSIS — N183 Chronic kidney disease, stage 3 unspecified: Secondary | ICD-10-CM | POA: Diagnosis not present

## 2021-01-29 DIAGNOSIS — L97821 Non-pressure chronic ulcer of other part of left lower leg limited to breakdown of skin: Secondary | ICD-10-CM | POA: Diagnosis not present

## 2021-01-31 ENCOUNTER — Emergency Department: Payer: Medicare HMO

## 2021-01-31 ENCOUNTER — Other Ambulatory Visit: Payer: Self-pay

## 2021-01-31 ENCOUNTER — Emergency Department
Admission: EM | Admit: 2021-01-31 | Discharge: 2021-02-01 | Disposition: A | Discharge status: Home / Self Care | Payer: Medicare HMO | Attending: Emergency Medicine | Admitting: Emergency Medicine

## 2021-01-31 ENCOUNTER — Encounter: Payer: Self-pay | Admitting: Emergency Medicine

## 2021-01-31 DIAGNOSIS — R52 Pain, unspecified: Secondary | ICD-10-CM | POA: Diagnosis not present

## 2021-01-31 DIAGNOSIS — Z7722 Contact with and (suspected) exposure to environmental tobacco smoke (acute) (chronic): Secondary | ICD-10-CM | POA: Insufficient documentation

## 2021-01-31 DIAGNOSIS — R600 Localized edema: Secondary | ICD-10-CM | POA: Insufficient documentation

## 2021-01-31 DIAGNOSIS — N183 Chronic kidney disease, stage 3 unspecified: Secondary | ICD-10-CM | POA: Diagnosis not present

## 2021-01-31 DIAGNOSIS — J449 Chronic obstructive pulmonary disease, unspecified: Secondary | ICD-10-CM | POA: Insufficient documentation

## 2021-01-31 DIAGNOSIS — I130 Hypertensive heart and chronic kidney disease with heart failure and stage 1 through stage 4 chronic kidney disease, or unspecified chronic kidney disease: Secondary | ICD-10-CM | POA: Insufficient documentation

## 2021-01-31 DIAGNOSIS — W19XXXA Unspecified fall, initial encounter: Secondary | ICD-10-CM

## 2021-01-31 DIAGNOSIS — M4316 Spondylolisthesis, lumbar region: Secondary | ICD-10-CM | POA: Diagnosis not present

## 2021-01-31 DIAGNOSIS — I5033 Acute on chronic diastolic (congestive) heart failure: Secondary | ICD-10-CM | POA: Insufficient documentation

## 2021-01-31 DIAGNOSIS — E039 Hypothyroidism, unspecified: Secondary | ICD-10-CM | POA: Insufficient documentation

## 2021-01-31 DIAGNOSIS — J45909 Unspecified asthma, uncomplicated: Secondary | ICD-10-CM | POA: Insufficient documentation

## 2021-01-31 DIAGNOSIS — R296 Repeated falls: Secondary | ICD-10-CM | POA: Insufficient documentation

## 2021-01-31 DIAGNOSIS — S3210XA Unspecified fracture of sacrum, initial encounter for closed fracture: Secondary | ICD-10-CM | POA: Diagnosis not present

## 2021-01-31 DIAGNOSIS — S3992XA Unspecified injury of lower back, initial encounter: Secondary | ICD-10-CM | POA: Diagnosis not present

## 2021-01-31 DIAGNOSIS — R000 Tachycardia, unspecified: Secondary | ICD-10-CM | POA: Diagnosis not present

## 2021-01-31 DIAGNOSIS — Z20822 Contact with and (suspected) exposure to covid-19: Secondary | ICD-10-CM | POA: Insufficient documentation

## 2021-01-31 DIAGNOSIS — Z043 Encounter for examination and observation following other accident: Secondary | ICD-10-CM | POA: Diagnosis not present

## 2021-01-31 DIAGNOSIS — Y92009 Unspecified place in unspecified non-institutional (private) residence as the place of occurrence of the external cause: Secondary | ICD-10-CM | POA: Insufficient documentation

## 2021-01-31 DIAGNOSIS — W010XXA Fall on same level from slipping, tripping and stumbling without subsequent striking against object, initial encounter: Secondary | ICD-10-CM | POA: Insufficient documentation

## 2021-01-31 DIAGNOSIS — Z7901 Long term (current) use of anticoagulants: Secondary | ICD-10-CM | POA: Insufficient documentation

## 2021-01-31 DIAGNOSIS — Z79899 Other long term (current) drug therapy: Secondary | ICD-10-CM | POA: Insufficient documentation

## 2021-01-31 DIAGNOSIS — R609 Edema, unspecified: Secondary | ICD-10-CM | POA: Diagnosis not present

## 2021-01-31 DIAGNOSIS — I700 Atherosclerosis of aorta: Secondary | ICD-10-CM | POA: Diagnosis not present

## 2021-01-31 DIAGNOSIS — M47816 Spondylosis without myelopathy or radiculopathy, lumbar region: Secondary | ICD-10-CM | POA: Diagnosis not present

## 2021-01-31 HISTORY — DX: Carrier or suspected carrier of methicillin resistant Staphylococcus aureus: Z22.322

## 2021-01-31 LAB — CBC
HCT: 36.5 % (ref 36.0–46.0)
Hemoglobin: 11.8 g/dL — ABNORMAL LOW (ref 12.0–15.0)
MCH: 28.4 pg (ref 26.0–34.0)
MCHC: 32.3 g/dL (ref 30.0–36.0)
MCV: 87.7 fL (ref 80.0–100.0)
Platelets: 266 10*3/uL (ref 150–400)
RBC: 4.16 MIL/uL (ref 3.87–5.11)
RDW: 15.9 % — ABNORMAL HIGH (ref 11.5–15.5)
WBC: 11.4 10*3/uL — ABNORMAL HIGH (ref 4.0–10.5)
nRBC: 0 % (ref 0.0–0.2)

## 2021-01-31 LAB — URINALYSIS, COMPLETE (UACMP) WITH MICROSCOPIC
Bacteria, UA: NONE SEEN
Bilirubin Urine: NEGATIVE
Glucose, UA: NEGATIVE mg/dL
Hgb urine dipstick: NEGATIVE
Ketones, ur: NEGATIVE mg/dL
Leukocytes,Ua: NEGATIVE
Nitrite: NEGATIVE
Protein, ur: NEGATIVE mg/dL
Specific Gravity, Urine: 1.01 (ref 1.005–1.030)
pH: 5.5 (ref 5.0–8.0)

## 2021-01-31 LAB — BASIC METABOLIC PANEL
Anion gap: 7 (ref 5–15)
BUN: 43 mg/dL — ABNORMAL HIGH (ref 8–23)
CO2: 25 mmol/L (ref 22–32)
Calcium: 10.6 mg/dL — ABNORMAL HIGH (ref 8.9–10.3)
Chloride: 103 mmol/L (ref 98–111)
Creatinine, Ser: 1.12 mg/dL — ABNORMAL HIGH (ref 0.44–1.00)
GFR, Estimated: 47 mL/min — ABNORMAL LOW (ref >60)
Glucose, Bld: 93 mg/dL (ref 70–99)
Potassium: 4.4 mmol/L (ref 3.5–5.1)
Sodium: 135 mmol/L (ref 135–145)

## 2021-01-31 NOTE — ED Triage Notes (Addendum)
Pt comes into the ED via [facility] from home c/o mechanical fall today while trying to answer the door and let go of her walker and fell.  Pt fell backwards, but denies any LOD or hitting of her head.  Pt has chronic back pain and states the back pain from the fall is no different from her normal.  EMS noted that the patient has significant peripheral edema with open sores on her legs and weeping from the legs.  PT denies any problems at this time.  Pt admits that she has been diagnosed with MRSA and cellulitis and she is actively being treated for it.  Pt is actively on antibiotics and per the patient, the wounds are getting better now that she has started the antibiotics. Pt has DNR in hand.

## 2021-01-31 NOTE — ED Triage Notes (Signed)
Pt to ED via [facility] from home for a fall. Pt lost her balance and not able to get up. Pt has chronic back pain but not having pain the fall. Pt wanted to come and get her legs checked out. Pt has MRSA, both legs are wrapped and weeping. Pt is in NAD.

## 2021-01-31 NOTE — ED Provider Notes (Signed)
[facility] Emergency Department Provider Note   ____________________________________________   I have reviewed the triage vital signs and the nursing notes.   HISTORY  Chief Complaint Fall   History limited by: Not Limited   HPI [name] is a 87 y.o. female who presents to the emergency department today after a fall.  The patient states that she was getting a delivery when she took her hands off her walker and fell backwards.  She did hit her head although did not lose consciousness.  Additionally the patient landed on her buttocks and is complaining of some back pain.  She has chronic back pain does not necessarily state it is any worse today.  Additionally patient has concerns for bilateral lower extremity swelling and possible cellulitis.  She has history of MRSA.  She is currently on prophylactic antibiotics for cellulitis.   Records reviewed. Per medical record review patient has a history of COPD, CHF.  Past Medical History:  Diagnosis Date   Arthritis    Asthma    CHF (congestive heart failure) (HCC)    COPD (chronic obstructive pulmonary disease) (HCC)    Cough    GERD (gastroesophageal reflux disease)    Gout    Hiatal hernia    Hypertension    Hypothyroidism    MRSA (methicillin resistant staph aureus) culture positive    Neuropathic pain of right lower extremity    Peripheral vascular disease ([place])    "poor circulation"   Seasonal allergies    Shortness of breath dyspnea    Sleep apnea    uses CPAP (sometimes)    Patient Active Problem List   Diagnosis Date Noted   Allergic rhinitis 08/10/2020   Peripheral neuropathy 08/10/2020   Acute delirium    Hypomagnesemia    Sepsis due to group B Streptococcus without acute organ dysfunction (HCC)    Acute on chronic diastolic CHF (congestive heart failure) (HCC)    Atrial fibrillation, chronic ([place])    Depression    Sepsis secondary to UTI ([place]) 07/05/2020   COPD with acute  exacerbation (HCC)    Hypothyroidism    Hypertension    CKD (chronic kidney disease) stage 3, GFR 30-59 ml/min (HCC)    Chronic kidney disease 06/21/2020   Secondary hyperaldosteronism ([place]) 06/16/2020   Stasis dermatitis of both legs 07/28/2017   Pruritic rash 07/03/2017   Gait instability 05/27/2017   CHF (congestive heart failure) ([place]) 03/27/2017   Lower extremity ulceration, left, limited to breakdown of skin ([place]) 03/27/2017   High risk medication use 03/12/2017   Primary hyperparathyroidism ([place]) 03/12/2017   Impaired glucose tolerance 03/12/2017   Noncompliance 03/12/2017   Parathyroid adenoma 03/12/2017   Chronic renal insufficiency, stage 3 (moderate) ([place]) 03/12/2017   Mild reactive airways disease 03/08/2016   Influenza 03/06/2016   Atrial fibrillation ([place]) 03/06/2016   Morbid obesity with BMI of 45.0-49.9, adult ([place]) 02/18/2016   OSA (obstructive sleep apnea) 02/18/2016   CKD (chronic kidney disease), stage II 02/12/2016   Swelling of limb 02/12/2016   Pain in toes of both feet 01/14/2016   Varicose veins of both lower extremities with pain 01/14/2016   Lymphedema 01/14/2016   Longstanding persistent atrial fibrillation ([place]) 10/04/2015   Atrophic vaginitis 12/13/2014   Hypercalcemia 12/13/2014   Hyperparathyroidism ([place]) 12/13/2014   Chronic UTI (urinary tract infection) 11/29/2014   Hyperlipidemia 01/03/2014   Lymphedema of both lower extremities 12/08/2013   SOB (shortness of breath) 12/08/2013   Anxiety 06/15/2013  GERD (gastroesophageal reflux disease) 06/15/2013   Osteoarthritis 06/15/2013   COPD (chronic obstructive pulmonary disease) ([place]) 06/15/2013   Essential hypertension 06/15/2013   Incomplete emptying of bladder 05/25/2013   Mixed incontinence 81/44/8185   Renal colic 63/14/9702   Renal cyst, acquired 05/25/2013   Sciatica 05/25/2013   Chronic cystitis 05/25/2013    Past Surgical History:  Procedure Laterality Date   ANKLE ARTHROSCOPY      BACK SURGERY     L4-L5 Decompression   CARDIAC CATHETERIZATION  1995   CATARACT EXTRACTION W/PHACO Right 07/19/2014   Procedure: CATARACT EXTRACTION PHACO AND INTRAOCULAR LENS PLACEMENT ([place]);  Surgeon: Leandrew Koyanagi, MD;  Location: [place];  Service: Ophthalmology;  Laterality: Right;   CATARACT EXTRACTION W/PHACO Left 08/23/2014   Procedure: CATARACT EXTRACTION PHACO AND INTRAOCULAR LENS PLACEMENT (IOC);  Surgeon: Leandrew Koyanagi, MD;  Location: [place];  Service: Ophthalmology;  Laterality: Left;   CHOLECYSTECTOMY     COLONOSCOPY     PARATHYROIDECTOMY  2003   TONSILLECTOMY     UVULOPALATOPHARYNGOPLASTY      Prior to Admission medications   Medication Sig Start Date End Date Taking? Authorizing Provider  acetaminophen (TYLENOL) 500 MG tablet Take 500-1,000 mg by mouth every 6 (six) hours as needed for mild pain or moderate pain.    [provider]  apixaban (ELIQUIS) 5 MG TABS tablet Take 5 mg by mouth 2 (two) times daily.    [provider]  DULoxetine (CYMBALTA) 60 MG capsule Take 60 mg by mouth daily. 05/04/20   [provider]  furosemide (LASIX) 20 MG tablet Take 1 tablet (20 mg total) by mouth 2 (two) times daily. 07/10/20   Loletha Grayer, MD  ipratropium-albuterol (DUONEB) 0.5-2.5 (3) MG/3ML SOLN Take 3 mLs by nebulization every 6 (six) hours as needed (shortness of breath). 07/10/20   Loletha Grayer, MD  levothyroxine (SYNTHROID) 200 MCG tablet Take 200 mcg by mouth daily. 06/19/20   [provider]  metoprolol succinate (TOPROL-XL) 25 MG 24 hr tablet Take 0.5 tablets (12.5 mg total) by mouth at bedtime. 07/10/20   Loletha Grayer, MD  montelukast (SINGULAIR) 10 MG tablet Take 10 mg by mouth daily.    [provider]  Multiple Vitamins-Minerals (PRESERVISION AREDS 2+MULTI VIT) CAPS Take 1 capsule by mouth as directed.    [provider]  traZODone (DESYREL) 50 MG tablet Take 1 tablet (50 mg  total) by mouth at bedtime as needed for sleep. 07/10/20   Loletha Grayer, MD  VENTOLIN HFA 108 (90 Base) MCG/ACT inhaler Inhale 2 puffs into the lungs every 6 (six) hours as needed for wheezing or shortness of breath.    [provider]  vitamin B-12 (CYANOCOBALAMIN) 500 MCG tablet Take 500 mcg by mouth daily.    [provider]    Allergies Aspirin, Prednisone, Sulfa antibiotics, and Sulfacetamide sodium  Family History  Problem Relation Age of Onset   Cancer Mother    Hyperlipidemia Son    Diabetes Son    Cancer Maternal Grandmother     Social History Social History   Tobacco Use   Smoking status: Never    Passive exposure: Yes   Smokeless tobacco: Never  Substance Use Topics   Alcohol use: No   Drug use: No    Review of Systems Constitutional: No fever/chills Eyes: No visual changes. ENT: No sore throat. Cardiovascular: Denies chest pain. Respiratory: Denies shortness of breath. Gastrointestinal: No abdominal pain.  No nausea, no vomiting.  No diarrhea.  Genitourinary: Negative for dysuria. Musculoskeletal: Positive for leg swelling, low back pain. Skin: Positive for wounds to legs. Neurological: Negative for headaches, focal weakness or numbness.  ____________________________________________   PHYSICAL EXAM:  VITAL SIGNS: ED Triage Vitals  Enc Vitals Group     BP 01/31/21 1752 122/86     Pulse Rate 01/31/21 1752 83     Resp 01/31/21 1752 19     Temp 01/31/21 1752 97.8 F (36.6 C)     Temp Source 01/31/21 1752 Oral     SpO2 01/31/21 1752 98 %     Weight 01/31/21 1753 274 lb 14.6 oz (124.7 kg)     Height 01/31/21 1753 5\' 5"  (1.651 m)     Head Circumference --      Peak Flow --      Pain Score 01/31/21 1752 7   Constitutional: Alert and oriented.  Eyes: Conjunctivae are normal.  ENT      Head: Normocephalic and atraumatic.      Nose: No congestion/rhinnorhea.      Mouth/Throat: Mucous membranes are moist.      Neck: No  stridor. Hematological/Lymphatic/Immunilogical: No cervical lymphadenopathy. Cardiovascular: Normal rate, regular rhythm.  No murmurs, rubs, or gallops.  Respiratory: Normal respiratory effort without tachypnea nor retractions. Breath sounds are clear and equal bilaterally. No wheezes/rales/rhonchi. Gastrointestinal: Soft and non tender. No rebound. No guarding.  Genitourinary: Deferred Musculoskeletal: Positive for lower spinal tenderness. Bilateral lower extremity edema.  Neurologic:  Normal speech and language. No gross focal neurologic deficits are appreciated.  Skin:  Skin is warm, dry and intact. No rash noted. Psychiatric: Mood and affect are normal. Speech and behavior are normal. Patient exhibits appropriate insight and judgment.  ____________________________________________    LABS (pertinent positives/negatives)  CBC wbc 11.4, hgb 11.8, plt 266 BMP na 135, k 4.4, glu 93, cr 1.12   ____________________________________________    RADIOLOGY  Lumbar spine No acute osseous abnormality identified  ____________________________________________   PROCEDURES  Procedures  ____________________________________________   INITIAL IMPRESSION / ASSESSMENT AND PLAN / ED COURSE  Pertinent labs & imaging results that were available during my care of the patient were reviewed by me and considered in my medical decision making (see chart for details).   Patient presents to the emergency department today after a fall.  Patient complaining primarily of some low back pain.  Lumbar spine x-ray without any acute fracture.  After my initial exam of the patient family did talk to me outside of the room.  They are concerned about patient's frequent falls.  They are having a hard time caring for patient.  Apparently the patient's daughter whom I did not speak to his power of attorney.  Awaiting imaging at time of signout.  Also awaiting  urine. ____________________________________________   FINAL CLINICAL IMPRESSION(S) / ED DIAGNOSES  Final diagnoses:  Fall, initial encounter  Lower extremity edema     Note: This dictation was prepared with Dragon dictation. Any transcriptional errors that result from this process are unintentional     Nance Pear, MD 01/31/21 2306

## 2021-02-01 DIAGNOSIS — L97811 Non-pressure chronic ulcer of other part of right lower leg limited to breakdown of skin: Secondary | ICD-10-CM | POA: Diagnosis not present

## 2021-02-01 DIAGNOSIS — I5032 Chronic diastolic (congestive) heart failure: Secondary | ICD-10-CM | POA: Diagnosis not present

## 2021-02-01 DIAGNOSIS — I872 Venous insufficiency (chronic) (peripheral): Secondary | ICD-10-CM | POA: Diagnosis not present

## 2021-02-01 DIAGNOSIS — I130 Hypertensive heart and chronic kidney disease with heart failure and stage 1 through stage 4 chronic kidney disease, or unspecified chronic kidney disease: Secondary | ICD-10-CM | POA: Diagnosis not present

## 2021-02-01 DIAGNOSIS — L97821 Non-pressure chronic ulcer of other part of left lower leg limited to breakdown of skin: Secondary | ICD-10-CM | POA: Diagnosis not present

## 2021-02-01 DIAGNOSIS — I890 Lymphedema, not elsewhere classified: Secondary | ICD-10-CM | POA: Diagnosis not present

## 2021-02-01 DIAGNOSIS — M1030 Gout due to renal impairment, unspecified site: Secondary | ICD-10-CM | POA: Diagnosis not present

## 2021-02-01 DIAGNOSIS — N183 Chronic kidney disease, stage 3 unspecified: Secondary | ICD-10-CM | POA: Diagnosis not present

## 2021-02-01 DIAGNOSIS — J449 Chronic obstructive pulmonary disease, unspecified: Secondary | ICD-10-CM | POA: Diagnosis not present

## 2021-02-01 LAB — RESP PANEL BY RT-PCR (FLU A&B, COVID) ARPGX2
Influenza A by PCR: NEGATIVE
Influenza B by PCR: NEGATIVE
SARS Coronavirus 2 by RT PCR: NEGATIVE

## 2021-02-01 NOTE — Discharge Instructions (Signed)
Call your primary doctor to request At-Home Physical Therapy  I'd recommend the following: - Start an over-the-counter PROBIOTIC. GENERIC is fine. - Use DESITIN/DIAPER BARRIER CREAM to help with your wounds stinging. Dry the area then apply, before covering.

## 2021-02-01 NOTE — ED Provider Notes (Signed)
Patient seen by Dr. Archie Balboa, see his note for full details. Briefly, pleasant 87 yo F here with fall. H/o recurrent falls 2/2 generalized weakness, also leg edema from lymphedema/stasis. Pt is HDS and well appearing. Labs reviewed and unremarkable, mild leukocytosis is nonspecific and near baseline. No fevers or infectious sx. No cough, SOB, abd pain. Pt was offered SNF placement but would like to return home. Family at bedside and will work out care at home. Plan to f/u CT imaging, d/c if negative.  1:06 AM CT imaging reviewed and is negative. Discussed with pt - she is back to baseline, feels fine, and would like to go home. I discussed that she would likely benefit from PT referral, and will contact her PCP for this. Otherwise, encouraged hydration, supportive care, and good return precautions. She is well appearing, satting well on RA. Abdomen soft, NT, ND. Wounds c/d/I and improving.   Duffy Bruce, MD 02/01/21 [phone]

## 2021-02-01 NOTE — ED Notes (Addendum)
Pts leg dressings changed with coband and gauze on bilateral lower extremities

## 2021-02-06 DIAGNOSIS — J449 Chronic obstructive pulmonary disease, unspecified: Secondary | ICD-10-CM | POA: Diagnosis not present

## 2021-02-06 DIAGNOSIS — L97821 Non-pressure chronic ulcer of other part of left lower leg limited to breakdown of skin: Secondary | ICD-10-CM | POA: Diagnosis not present

## 2021-02-06 DIAGNOSIS — I130 Hypertensive heart and chronic kidney disease with heart failure and stage 1 through stage 4 chronic kidney disease, or unspecified chronic kidney disease: Secondary | ICD-10-CM | POA: Diagnosis not present

## 2021-02-06 DIAGNOSIS — L97811 Non-pressure chronic ulcer of other part of right lower leg limited to breakdown of skin: Secondary | ICD-10-CM | POA: Diagnosis not present

## 2021-02-06 DIAGNOSIS — I872 Venous insufficiency (chronic) (peripheral): Secondary | ICD-10-CM | POA: Diagnosis not present

## 2021-02-06 DIAGNOSIS — I890 Lymphedema, not elsewhere classified: Secondary | ICD-10-CM | POA: Diagnosis not present

## 2021-02-06 DIAGNOSIS — N183 Chronic kidney disease, stage 3 unspecified: Secondary | ICD-10-CM | POA: Diagnosis not present

## 2021-02-06 DIAGNOSIS — M1030 Gout due to renal impairment, unspecified site: Secondary | ICD-10-CM | POA: Diagnosis not present

## 2021-02-06 DIAGNOSIS — I5032 Chronic diastolic (congestive) heart failure: Secondary | ICD-10-CM | POA: Diagnosis not present

## 2021-02-09 DIAGNOSIS — J449 Chronic obstructive pulmonary disease, unspecified: Secondary | ICD-10-CM | POA: Diagnosis not present

## 2021-02-09 DIAGNOSIS — I5032 Chronic diastolic (congestive) heart failure: Secondary | ICD-10-CM | POA: Diagnosis not present

## 2021-02-09 DIAGNOSIS — N183 Chronic kidney disease, stage 3 unspecified: Secondary | ICD-10-CM | POA: Diagnosis not present

## 2021-02-09 DIAGNOSIS — I872 Venous insufficiency (chronic) (peripheral): Secondary | ICD-10-CM | POA: Diagnosis not present

## 2021-02-09 DIAGNOSIS — M1030 Gout due to renal impairment, unspecified site: Secondary | ICD-10-CM | POA: Diagnosis not present

## 2021-02-09 DIAGNOSIS — I890 Lymphedema, not elsewhere classified: Secondary | ICD-10-CM | POA: Diagnosis not present

## 2021-02-09 DIAGNOSIS — L97821 Non-pressure chronic ulcer of other part of left lower leg limited to breakdown of skin: Secondary | ICD-10-CM | POA: Diagnosis not present

## 2021-02-09 DIAGNOSIS — I130 Hypertensive heart and chronic kidney disease with heart failure and stage 1 through stage 4 chronic kidney disease, or unspecified chronic kidney disease: Secondary | ICD-10-CM | POA: Diagnosis not present

## 2021-02-09 DIAGNOSIS — L97811 Non-pressure chronic ulcer of other part of right lower leg limited to breakdown of skin: Secondary | ICD-10-CM | POA: Diagnosis not present

## 2021-02-13 DIAGNOSIS — J449 Chronic obstructive pulmonary disease, unspecified: Secondary | ICD-10-CM | POA: Diagnosis not present

## 2021-02-13 DIAGNOSIS — I5032 Chronic diastolic (congestive) heart failure: Secondary | ICD-10-CM | POA: Diagnosis not present

## 2021-02-13 DIAGNOSIS — I872 Venous insufficiency (chronic) (peripheral): Secondary | ICD-10-CM | POA: Diagnosis not present

## 2021-02-13 DIAGNOSIS — N183 Chronic kidney disease, stage 3 unspecified: Secondary | ICD-10-CM | POA: Diagnosis not present

## 2021-02-13 DIAGNOSIS — L97821 Non-pressure chronic ulcer of other part of left lower leg limited to breakdown of skin: Secondary | ICD-10-CM | POA: Diagnosis not present

## 2021-02-13 DIAGNOSIS — I130 Hypertensive heart and chronic kidney disease with heart failure and stage 1 through stage 4 chronic kidney disease, or unspecified chronic kidney disease: Secondary | ICD-10-CM | POA: Diagnosis not present

## 2021-02-13 DIAGNOSIS — M1030 Gout due to renal impairment, unspecified site: Secondary | ICD-10-CM | POA: Diagnosis not present

## 2021-02-13 DIAGNOSIS — L97811 Non-pressure chronic ulcer of other part of right lower leg limited to breakdown of skin: Secondary | ICD-10-CM | POA: Diagnosis not present

## 2021-02-13 DIAGNOSIS — I890 Lymphedema, not elsewhere classified: Secondary | ICD-10-CM | POA: Diagnosis not present

## 2021-02-15 DIAGNOSIS — J449 Chronic obstructive pulmonary disease, unspecified: Secondary | ICD-10-CM | POA: Diagnosis not present

## 2021-02-15 DIAGNOSIS — I890 Lymphedema, not elsewhere classified: Secondary | ICD-10-CM | POA: Diagnosis not present

## 2021-02-15 DIAGNOSIS — M1030 Gout due to renal impairment, unspecified site: Secondary | ICD-10-CM | POA: Diagnosis not present

## 2021-02-15 DIAGNOSIS — N183 Chronic kidney disease, stage 3 unspecified: Secondary | ICD-10-CM | POA: Diagnosis not present

## 2021-02-15 DIAGNOSIS — L97811 Non-pressure chronic ulcer of other part of right lower leg limited to breakdown of skin: Secondary | ICD-10-CM | POA: Diagnosis not present

## 2021-02-15 DIAGNOSIS — I130 Hypertensive heart and chronic kidney disease with heart failure and stage 1 through stage 4 chronic kidney disease, or unspecified chronic kidney disease: Secondary | ICD-10-CM | POA: Diagnosis not present

## 2021-02-15 DIAGNOSIS — I5032 Chronic diastolic (congestive) heart failure: Secondary | ICD-10-CM | POA: Diagnosis not present

## 2021-02-15 DIAGNOSIS — I872 Venous insufficiency (chronic) (peripheral): Secondary | ICD-10-CM | POA: Diagnosis not present

## 2021-02-15 DIAGNOSIS — L97821 Non-pressure chronic ulcer of other part of left lower leg limited to breakdown of skin: Secondary | ICD-10-CM | POA: Diagnosis not present

## 2021-02-19 DIAGNOSIS — L97821 Non-pressure chronic ulcer of other part of left lower leg limited to breakdown of skin: Secondary | ICD-10-CM | POA: Diagnosis not present

## 2021-02-19 DIAGNOSIS — I130 Hypertensive heart and chronic kidney disease with heart failure and stage 1 through stage 4 chronic kidney disease, or unspecified chronic kidney disease: Secondary | ICD-10-CM | POA: Diagnosis not present

## 2021-02-19 DIAGNOSIS — N183 Chronic kidney disease, stage 3 unspecified: Secondary | ICD-10-CM | POA: Diagnosis not present

## 2021-02-19 DIAGNOSIS — M1030 Gout due to renal impairment, unspecified site: Secondary | ICD-10-CM | POA: Diagnosis not present

## 2021-02-19 DIAGNOSIS — I890 Lymphedema, not elsewhere classified: Secondary | ICD-10-CM | POA: Diagnosis not present

## 2021-02-19 DIAGNOSIS — J449 Chronic obstructive pulmonary disease, unspecified: Secondary | ICD-10-CM | POA: Diagnosis not present

## 2021-02-19 DIAGNOSIS — I872 Venous insufficiency (chronic) (peripheral): Secondary | ICD-10-CM | POA: Diagnosis not present

## 2021-02-19 DIAGNOSIS — L97811 Non-pressure chronic ulcer of other part of right lower leg limited to breakdown of skin: Secondary | ICD-10-CM | POA: Diagnosis not present

## 2021-02-19 DIAGNOSIS — I5032 Chronic diastolic (congestive) heart failure: Secondary | ICD-10-CM | POA: Diagnosis not present

## 2021-02-23 DIAGNOSIS — I5032 Chronic diastolic (congestive) heart failure: Secondary | ICD-10-CM | POA: Diagnosis not present

## 2021-02-23 DIAGNOSIS — I890 Lymphedema, not elsewhere classified: Secondary | ICD-10-CM | POA: Diagnosis not present

## 2021-02-23 DIAGNOSIS — I872 Venous insufficiency (chronic) (peripheral): Secondary | ICD-10-CM | POA: Diagnosis not present

## 2021-02-23 DIAGNOSIS — I130 Hypertensive heart and chronic kidney disease with heart failure and stage 1 through stage 4 chronic kidney disease, or unspecified chronic kidney disease: Secondary | ICD-10-CM | POA: Diagnosis not present

## 2021-02-23 DIAGNOSIS — L97821 Non-pressure chronic ulcer of other part of left lower leg limited to breakdown of skin: Secondary | ICD-10-CM | POA: Diagnosis not present

## 2021-02-23 DIAGNOSIS — L97811 Non-pressure chronic ulcer of other part of right lower leg limited to breakdown of skin: Secondary | ICD-10-CM | POA: Diagnosis not present

## 2021-02-23 DIAGNOSIS — M1030 Gout due to renal impairment, unspecified site: Secondary | ICD-10-CM | POA: Diagnosis not present

## 2021-02-23 DIAGNOSIS — N183 Chronic kidney disease, stage 3 unspecified: Secondary | ICD-10-CM | POA: Diagnosis not present

## 2021-02-23 DIAGNOSIS — J449 Chronic obstructive pulmonary disease, unspecified: Secondary | ICD-10-CM | POA: Diagnosis not present

## 2021-02-25 ENCOUNTER — Inpatient Hospital Stay
Admission: EM | Admit: 2021-02-25 | Discharge: 2021-02-28 | DRG: 872 | Disposition: A | Discharge status: Skilled Nursing Facility | Payer: Medicare HMO | Attending: Student | Admitting: Student

## 2021-02-25 ENCOUNTER — Emergency Department: Payer: Medicare HMO

## 2021-02-25 ENCOUNTER — Other Ambulatory Visit: Payer: Self-pay

## 2021-02-25 DIAGNOSIS — Z833 Family history of diabetes mellitus: Secondary | ICD-10-CM

## 2021-02-25 DIAGNOSIS — Z20822 Contact with and (suspected) exposure to covid-19: Secondary | ICD-10-CM | POA: Diagnosis not present

## 2021-02-25 DIAGNOSIS — K529 Noninfective gastroenteritis and colitis, unspecified: Secondary | ICD-10-CM | POA: Diagnosis present

## 2021-02-25 DIAGNOSIS — F32A Depression, unspecified: Secondary | ICD-10-CM | POA: Diagnosis present

## 2021-02-25 DIAGNOSIS — E039 Hypothyroidism, unspecified: Secondary | ICD-10-CM | POA: Diagnosis present

## 2021-02-25 DIAGNOSIS — I10 Essential (primary) hypertension: Secondary | ICD-10-CM

## 2021-02-25 DIAGNOSIS — K219 Gastro-esophageal reflux disease without esophagitis: Secondary | ICD-10-CM | POA: Diagnosis present

## 2021-02-25 DIAGNOSIS — R197 Diarrhea, unspecified: Secondary | ICD-10-CM | POA: Diagnosis not present

## 2021-02-25 DIAGNOSIS — R6520 Severe sepsis without septic shock: Secondary | ICD-10-CM | POA: Diagnosis present

## 2021-02-25 DIAGNOSIS — I130 Hypertensive heart and chronic kidney disease with heart failure and stage 1 through stage 4 chronic kidney disease, or unspecified chronic kidney disease: Secondary | ICD-10-CM | POA: Diagnosis not present

## 2021-02-25 DIAGNOSIS — Z6841 Body Mass Index (BMI) 40.0 and over, adult: Secondary | ICD-10-CM | POA: Diagnosis not present

## 2021-02-25 DIAGNOSIS — L03116 Cellulitis of left lower limb: Secondary | ICD-10-CM | POA: Diagnosis not present

## 2021-02-25 DIAGNOSIS — N1831 Chronic kidney disease, stage 3a: Secondary | ICD-10-CM | POA: Diagnosis present

## 2021-02-25 DIAGNOSIS — J449 Chronic obstructive pulmonary disease, unspecified: Secondary | ICD-10-CM | POA: Diagnosis present

## 2021-02-25 DIAGNOSIS — N179 Acute kidney failure, unspecified: Secondary | ICD-10-CM | POA: Diagnosis present

## 2021-02-25 DIAGNOSIS — I5032 Chronic diastolic (congestive) heart failure: Secondary | ICD-10-CM | POA: Diagnosis present

## 2021-02-25 DIAGNOSIS — R609 Edema, unspecified: Secondary | ICD-10-CM | POA: Diagnosis not present

## 2021-02-25 DIAGNOSIS — E213 Hyperparathyroidism, unspecified: Secondary | ICD-10-CM | POA: Diagnosis not present

## 2021-02-25 DIAGNOSIS — Z881 Allergy status to other antibiotic agents status: Secondary | ICD-10-CM

## 2021-02-25 DIAGNOSIS — E860 Dehydration: Secondary | ICD-10-CM | POA: Diagnosis present

## 2021-02-25 DIAGNOSIS — A084 Viral intestinal infection, unspecified: Secondary | ICD-10-CM | POA: Diagnosis not present

## 2021-02-25 DIAGNOSIS — Z961 Presence of intraocular lens: Secondary | ICD-10-CM | POA: Diagnosis present

## 2021-02-25 DIAGNOSIS — G4733 Obstructive sleep apnea (adult) (pediatric): Secondary | ICD-10-CM | POA: Diagnosis present

## 2021-02-25 DIAGNOSIS — I4891 Unspecified atrial fibrillation: Secondary | ICD-10-CM | POA: Diagnosis not present

## 2021-02-25 DIAGNOSIS — Z79899 Other long term (current) drug therapy: Secondary | ICD-10-CM

## 2021-02-25 DIAGNOSIS — R000 Tachycardia, unspecified: Secondary | ICD-10-CM | POA: Diagnosis not present

## 2021-02-25 DIAGNOSIS — A419 Sepsis, unspecified organism: Principal | ICD-10-CM | POA: Diagnosis present

## 2021-02-25 DIAGNOSIS — Z9841 Cataract extraction status, right eye: Secondary | ICD-10-CM

## 2021-02-25 DIAGNOSIS — Z79890 Hormone replacement therapy (postmenopausal): Secondary | ICD-10-CM

## 2021-02-25 DIAGNOSIS — R0902 Hypoxemia: Secondary | ICD-10-CM | POA: Diagnosis not present

## 2021-02-25 DIAGNOSIS — Z66 Do not resuscitate: Secondary | ICD-10-CM | POA: Diagnosis present

## 2021-02-25 DIAGNOSIS — Z7901 Long term (current) use of anticoagulants: Secondary | ICD-10-CM

## 2021-02-25 DIAGNOSIS — I739 Peripheral vascular disease, unspecified: Secondary | ICD-10-CM | POA: Diagnosis present

## 2021-02-25 DIAGNOSIS — R109 Unspecified abdominal pain: Secondary | ICD-10-CM | POA: Diagnosis not present

## 2021-02-25 DIAGNOSIS — R112 Nausea with vomiting, unspecified: Secondary | ICD-10-CM | POA: Diagnosis not present

## 2021-02-25 DIAGNOSIS — Z8614 Personal history of Methicillin resistant Staphylococcus aureus infection: Secondary | ICD-10-CM | POA: Diagnosis not present

## 2021-02-25 DIAGNOSIS — L03115 Cellulitis of right lower limb: Secondary | ICD-10-CM | POA: Diagnosis not present

## 2021-02-25 DIAGNOSIS — R6510 Systemic inflammatory response syndrome (SIRS) of non-infectious origin without acute organ dysfunction: Secondary | ICD-10-CM | POA: Diagnosis not present

## 2021-02-25 DIAGNOSIS — L03119 Cellulitis of unspecified part of limb: Secondary | ICD-10-CM | POA: Diagnosis present

## 2021-02-25 DIAGNOSIS — Z9049 Acquired absence of other specified parts of digestive tract: Secondary | ICD-10-CM

## 2021-02-25 DIAGNOSIS — R110 Nausea: Secondary | ICD-10-CM | POA: Diagnosis not present

## 2021-02-25 DIAGNOSIS — R1111 Vomiting without nausea: Secondary | ICD-10-CM | POA: Diagnosis not present

## 2021-02-25 DIAGNOSIS — Z0389 Encounter for observation for other suspected diseases and conditions ruled out: Secondary | ICD-10-CM | POA: Diagnosis not present

## 2021-02-25 DIAGNOSIS — Z888 Allergy status to other drugs, medicaments and biological substances status: Secondary | ICD-10-CM

## 2021-02-25 DIAGNOSIS — Z9842 Cataract extraction status, left eye: Secondary | ICD-10-CM

## 2021-02-25 DIAGNOSIS — Z886 Allergy status to analgesic agent status: Secondary | ICD-10-CM

## 2021-02-25 LAB — CBC
HCT: 35.6 % — ABNORMAL LOW (ref 36.0–46.0)
Hemoglobin: 11.5 g/dL — ABNORMAL LOW (ref 12.0–15.0)
MCH: 28.7 pg (ref 26.0–34.0)
MCHC: 32.3 g/dL (ref 30.0–36.0)
MCV: 88.8 fL (ref 80.0–100.0)
Platelets: 266 10*3/uL (ref 150–400)
RBC: 4.01 MIL/uL (ref 3.87–5.11)
RDW: 16 % — ABNORMAL HIGH (ref 11.5–15.5)
WBC: 10.4 10*3/uL (ref 4.0–10.5)
nRBC: 0 % (ref 0.0–0.2)

## 2021-02-25 LAB — CBC WITH DIFFERENTIAL/PLATELET
Abs Immature Granulocytes: 0.06 10*3/uL (ref 0.00–0.07)
Basophils Absolute: 0 10*3/uL (ref 0.0–0.1)
Basophils Relative: 0 %
Eosinophils Absolute: 0.2 10*3/uL (ref 0.0–0.5)
Eosinophils Relative: 2 %
HCT: 37.5 % (ref 36.0–46.0)
Hemoglobin: 12.1 g/dL (ref 12.0–15.0)
Immature Granulocytes: 1 %
Lymphocytes Relative: 27 %
Lymphs Abs: 3.4 10*3/uL (ref 0.7–4.0)
MCH: 28.6 pg (ref 26.0–34.0)
MCHC: 32.3 g/dL (ref 30.0–36.0)
MCV: 88.7 fL (ref 80.0–100.0)
Monocytes Absolute: 1 10*3/uL (ref 0.1–1.0)
Monocytes Relative: 8 %
Neutro Abs: 7.7 10*3/uL (ref 1.7–7.7)
Neutrophils Relative %: 62 %
Platelets: 294 10*3/uL (ref 150–400)
RBC: 4.23 MIL/uL (ref 3.87–5.11)
RDW: 16 % — ABNORMAL HIGH (ref 11.5–15.5)
WBC: 12.4 10*3/uL — ABNORMAL HIGH (ref 4.0–10.5)
nRBC: 0 % (ref 0.0–0.2)

## 2021-02-25 LAB — APTT: aPTT: 38 seconds — ABNORMAL HIGH (ref 24–36)

## 2021-02-25 LAB — PROTIME-INR
INR: 1.7 — ABNORMAL HIGH (ref 0.8–1.2)
Prothrombin Time: 20.1 seconds — ABNORMAL HIGH (ref 11.4–15.2)

## 2021-02-25 LAB — BRAIN NATRIURETIC PEPTIDE: B Natriuretic Peptide: 152.4 pg/mL — ABNORMAL HIGH (ref 0.0–100.0)

## 2021-02-25 LAB — COMPREHENSIVE METABOLIC PANEL
ALT: 22 U/L (ref 0–44)
AST: 30 U/L (ref 15–41)
Albumin: 3 g/dL — ABNORMAL LOW (ref 3.5–5.0)
Alkaline Phosphatase: 102 U/L (ref 38–126)
Anion gap: 7 (ref 5–15)
BUN: 40 mg/dL — ABNORMAL HIGH (ref 8–23)
CO2: 24 mmol/L (ref 22–32)
Calcium: 10.8 mg/dL — ABNORMAL HIGH (ref 8.9–10.3)
Chloride: 102 mmol/L (ref 98–111)
Creatinine, Ser: 1.47 mg/dL — ABNORMAL HIGH (ref 0.44–1.00)
GFR, Estimated: 34 mL/min — ABNORMAL LOW (ref >60)
Glucose, Bld: 139 mg/dL — ABNORMAL HIGH (ref 70–99)
Potassium: 3.8 mmol/L (ref 3.5–5.1)
Sodium: 133 mmol/L — ABNORMAL LOW (ref 135–145)
Total Bilirubin: 0.9 mg/dL (ref 0.3–1.2)
Total Protein: 6.6 g/dL (ref 6.5–8.1)

## 2021-02-25 LAB — URINALYSIS, COMPLETE (UACMP) WITH MICROSCOPIC
Bilirubin Urine: NEGATIVE
Glucose, UA: NEGATIVE mg/dL
Hgb urine dipstick: NEGATIVE
Ketones, ur: NEGATIVE mg/dL
Leukocytes,Ua: NEGATIVE
Nitrite: NEGATIVE
Protein, ur: NEGATIVE mg/dL
Specific Gravity, Urine: 1.021 (ref 1.005–1.030)
pH: 5 (ref 5.0–8.0)

## 2021-02-25 LAB — RESP PANEL BY RT-PCR (FLU A&B, COVID) ARPGX2
Influenza A by PCR: NEGATIVE
Influenza B by PCR: NEGATIVE
SARS Coronavirus 2 by RT PCR: NEGATIVE

## 2021-02-25 LAB — LACTIC ACID, PLASMA
Lactic Acid, Venous: 3 mmol/L (ref 0.5–1.9)
Lactic Acid, Venous: 1.5 mmol/L (ref 0.5–1.9)
Lactic Acid, Venous: 3.4 mmol/L (ref 0.5–1.9)

## 2021-02-25 LAB — TROPONIN I (HIGH SENSITIVITY): Troponin I (High Sensitivity): 15 ng/L (ref <18)

## 2021-02-25 LAB — PROCALCITONIN: Procalcitonin: <0.1 ng/mL

## 2021-02-25 LAB — TYPE AND SCREEN
ABO/RH(D): O POS
Antibody Screen: NEGATIVE

## 2021-02-25 LAB — SEDIMENTATION RATE: Sed Rate: 22 mm/hr (ref 0–30)

## 2021-02-25 MED ORDER — SODIUM CHLORIDE 0.9 % IV SOLN: 2.0000 g | INTRAVENOUS | Status: DC

## 2021-02-25 MED ORDER — VITAMIN B-12 1000 MCG PO TABS
500.0000 ug | ORAL_TABLET | Freq: Every day | ORAL | Status: DC
  Administered 2021-02-25 – 2021-02-28 (×4): 500 ug via ORAL
  Filled 2021-02-25 (×5): qty 1

## 2021-02-25 MED ORDER — DM-GUAIFENESIN ER 30-600 MG PO TB12
1.0000 | ORAL_TABLET | Freq: Two times a day (BID) | ORAL | Status: DC | PRN
  Administered 2021-02-27: 1 via ORAL
  Filled 2021-02-25 (×2): qty 1

## 2021-02-25 MED ORDER — HYDRALAZINE HCL 20 MG/ML IJ SOLN: 5.0000 mg | INTRAMUSCULAR | Status: DC | PRN

## 2021-02-25 MED ORDER — VANCOMYCIN HCL 2000 MG/400ML IV SOLN
2000.0000 mg | Freq: Once | INTRAVENOUS | Status: AC
  Administered 2021-02-25: 2000 mg via INTRAVENOUS
  Filled 2021-02-25: qty 400

## 2021-02-25 MED ORDER — METOPROLOL SUCCINATE ER 25 MG PO TB24
12.5000 mg | ORAL_TABLET | Freq: Every day | ORAL | Status: DC
  Administered 2021-02-25 – 2021-02-26 (×2): 12.5 mg via ORAL
  Filled 2021-02-25: qty 1
  Filled 2021-02-25: qty 0.5

## 2021-02-25 MED ORDER — MONTELUKAST SODIUM 10 MG PO TABS
10.0000 mg | ORAL_TABLET | Freq: Every day | ORAL | Status: DC
Start: 2021-02-25 — End: 2021-02-28
  Administered 2021-02-25 – 2021-02-28 (×4): 10 mg via ORAL
  Filled 2021-02-25 (×4): qty 1

## 2021-02-25 MED ORDER — SODIUM CHLORIDE 0.9 % IV BOLUS
1000.0000 mL | Freq: Once | INTRAVENOUS | Status: AC
  Administered 2021-02-25: 1000 mL via INTRAVENOUS

## 2021-02-25 MED ORDER — ACETAMINOPHEN 325 MG PO TABS
650.0000 mg | ORAL_TABLET | Freq: Four times a day (QID) | ORAL | Status: DC | PRN
  Administered 2021-02-25 – 2021-02-26 (×3): 650 mg via ORAL
  Filled 2021-02-25 (×3): qty 2

## 2021-02-25 MED ORDER — IOHEXOL 300 MG/ML  SOLN
100.0000 mL | Freq: Once | INTRAMUSCULAR | Status: AC | PRN
  Administered 2021-02-25: 100 mL via INTRAVENOUS

## 2021-02-25 MED ORDER — SODIUM CHLORIDE 0.9 % IV SOLN
2.0000 g | INTRAVENOUS | Status: DC
  Administered 2021-02-25 – 2021-02-27 (×3): 2 g via INTRAVENOUS
  Filled 2021-02-25: qty 2
  Filled 2021-02-25 (×3): qty 20

## 2021-02-25 MED ORDER — ONDANSETRON HCL 4 MG/2ML IJ SOLN
4.0000 mg | Freq: Three times a day (TID) | INTRAMUSCULAR | Status: DC | PRN
  Administered 2021-02-27: 4 mg via INTRAVENOUS
  Filled 2021-02-25: qty 2

## 2021-02-25 MED ORDER — OXYCODONE-ACETAMINOPHEN 5-325 MG PO TABS
1.0000 | ORAL_TABLET | ORAL | Status: DC | PRN
  Administered 2021-02-27 – 2021-02-28 (×2): 1 via ORAL
  Filled 2021-02-25 (×3): qty 1

## 2021-02-25 MED ORDER — DOXYCYCLINE MONOHYDRATE 100 MG PO CAPS: 100.0000 mg | ORAL_CAPSULE | Freq: Two times a day (BID) | ORAL | Status: DC

## 2021-02-25 MED ORDER — SODIUM CHLORIDE 0.9 % IV BOLUS
500.0000 mL | Freq: Once | INTRAVENOUS | Status: AC
  Administered 2021-02-25: 500 mL via INTRAVENOUS

## 2021-02-25 MED ORDER — DULOXETINE HCL 60 MG PO CPEP
60.0000 mg | ORAL_CAPSULE | Freq: Every day | ORAL | Status: DC
  Administered 2021-02-25 – 2021-02-28 (×4): 60 mg via ORAL
  Filled 2021-02-25 (×4): qty 1

## 2021-02-25 MED ORDER — SODIUM CHLORIDE 0.9 % IV SOLN
2.0000 g | Freq: Once | INTRAVENOUS | Status: AC
  Administered 2021-02-25: 2 g via INTRAVENOUS
  Filled 2021-02-25: qty 2

## 2021-02-25 MED ORDER — TRAZODONE HCL 50 MG PO TABS
50.0000 mg | ORAL_TABLET | Freq: Every evening | ORAL | Status: DC | PRN
  Filled 2021-02-25: qty 1

## 2021-02-25 MED ORDER — IPRATROPIUM-ALBUTEROL 0.5-2.5 (3) MG/3ML IN SOLN
3.0000 mL | Freq: Four times a day (QID) | RESPIRATORY_TRACT | Status: DC
  Administered 2021-02-25 – 2021-02-26 (×4): 3 mL via RESPIRATORY_TRACT
  Filled 2021-02-25 (×4): qty 3

## 2021-02-25 MED ORDER — LEVOTHYROXINE SODIUM 50 MCG PO TABS
200.0000 ug | ORAL_TABLET | Freq: Every day | ORAL | Status: DC
  Administered 2021-02-26 – 2021-02-28 (×3): 200 ug via ORAL
  Filled 2021-02-25 (×3): qty 4

## 2021-02-25 MED ORDER — LOSARTAN POTASSIUM 50 MG PO TABS
50.0000 mg | ORAL_TABLET | Freq: Every day | ORAL | Status: DC
  Administered 2021-02-25 – 2021-02-26 (×2): 50 mg via ORAL
  Filled 2021-02-25 (×2): qty 1

## 2021-02-25 MED ORDER — OCUVITE-LUTEIN PO CAPS
1.0000 | ORAL_CAPSULE | Freq: Every day | ORAL | Status: DC
  Administered 2021-02-25 – 2021-02-28 (×4): 1 via ORAL
  Filled 2021-02-25 (×4): qty 1

## 2021-02-25 MED ORDER — ALBUTEROL SULFATE (2.5 MG/3ML) 0.083% IN NEBU: 3.0000 mL | INHALATION_SOLUTION | RESPIRATORY_TRACT | Status: DC | PRN

## 2021-02-25 NOTE — ED Provider Notes (Signed)
[facility] Provider Note    Event Date/Time   First MD Initiated Contact with Patient 02/25/21 [phone]     (approximate)   History   No chief complaint on file.   HPI  [name] is a 88 y.o. female   with past medical history of COPD, CHF, hypertension who presents with diarrhea.  Patient notes that for the past week she has had multiple episodes of diarrhea.  Cannot quantify exactly how many episodes.  Denies blood in her stool.  She endorses right-sided flank pain and right-sided abdominal pain.  Denies urinary symptoms.  Patient has also had some cough and nausea.  She says that she is vomiting blood but on further questioning she notes that she has been coughing/gagging in the will be several drops of blood in what she is bringing up.  Unclear if this is truly vomiting.  Has had some cough and congestion.  Also has had COVID exposure.     Past Medical History:  Diagnosis Date   Arthritis    Asthma    CHF (congestive heart failure) (HCC)    COPD (chronic obstructive pulmonary disease) (HCC)    Cough    GERD (gastroesophageal reflux disease)    Gout    Hiatal hernia    Hypertension    Hypothyroidism    MRSA (methicillin resistant staph aureus) culture positive    Neuropathic pain of right lower extremity    Peripheral vascular disease ([place])    "poor circulation"   Seasonal allergies    Shortness of breath dyspnea    Sleep apnea    uses CPAP (sometimes)    Patient Active Problem List   Diagnosis Date Noted   Allergic rhinitis 08/10/2020   Peripheral neuropathy 08/10/2020   Acute delirium    Hypomagnesemia    Sepsis due to group B Streptococcus without acute organ dysfunction (HCC)    Acute on chronic diastolic CHF (congestive heart failure) (HCC)    Atrial fibrillation, chronic ([place])    Depression    Sepsis secondary to UTI ([place]) 07/05/2020   COPD with acute exacerbation (HCC)    Hypothyroidism    Hypertension    CKD (chronic kidney  disease) stage 3, GFR 30-59 ml/min (HCC)    Chronic kidney disease 06/21/2020   Secondary hyperaldosteronism ([place]) 06/16/2020   Stasis dermatitis of both legs 07/28/2017   Pruritic rash 07/03/2017   Gait instability 05/27/2017   CHF (congestive heart failure) ([place]) 03/27/2017   Lower extremity ulceration, left, limited to breakdown of skin ([place]) 03/27/2017   High risk medication use 03/12/2017   Primary hyperparathyroidism ([place]) 03/12/2017   Impaired glucose tolerance 03/12/2017   Noncompliance 03/12/2017   Parathyroid adenoma 03/12/2017   Chronic renal insufficiency, stage 3 (moderate) ([place]) 03/12/2017   Mild reactive airways disease 03/08/2016   Influenza 03/06/2016   Atrial fibrillation ([place]) 03/06/2016   Morbid obesity with BMI of 45.0-49.9, adult ([place]) 02/18/2016   OSA (obstructive sleep apnea) 02/18/2016   CKD (chronic kidney disease), stage II 02/12/2016   Swelling of limb 02/12/2016   Pain in toes of both feet 01/14/2016   Varicose veins of both lower extremities with pain 01/14/2016   Lymphedema 01/14/2016   Longstanding persistent atrial fibrillation ([place]) 10/04/2015   Atrophic vaginitis 12/13/2014   Hypercalcemia 12/13/2014   Hyperparathyroidism ([place]) 12/13/2014   Chronic UTI (urinary tract infection) 11/29/2014   Hyperlipidemia 01/03/2014   Lymphedema of both lower extremities 12/08/2013   SOB (shortness of breath) 12/08/2013  Anxiety 06/15/2013   GERD (gastroesophageal reflux disease) 06/15/2013   Osteoarthritis 06/15/2013   COPD (chronic obstructive pulmonary disease) ([place]) 06/15/2013   Essential hypertension 06/15/2013   Incomplete emptying of bladder 05/25/2013   Mixed incontinence 69/45/0388   Renal colic 82/80/0349   Renal cyst, acquired 05/25/2013   Sciatica 05/25/2013   Chronic cystitis 05/25/2013     Physical Exam  Triage Vital Signs: ED Triage Vitals  Enc Vitals Group     BP 02/25/21 0335 (!) 141/99     Pulse Rate 02/25/21 0335 (!) 103      Resp 02/25/21 0335 (!) 25     Temp 02/25/21 0335 97.8 F (36.6 C)     Temp src --      SpO2 02/25/21 0333 99 %     Weight 02/25/21 0339 275 lb 12.7 oz (125.1 kg)     Height 02/25/21 0339 5\' 5"  (1.651 m)     Head Circumference --      Peak Flow --      Pain Score 02/25/21 0336 0     Pain Loc --      Pain Edu? --      Excl. in [place]? --     Most recent vital signs: Vitals:   02/25/21 0335 02/25/21 0600  BP: (!) 141/99 122/76  Pulse: (!) 103 74  Resp: (!) 25 17  Temp: 97.8 F (36.6 C)   SpO2: 98% 100%     General: Awake, no distress.  CV:  Good peripheral perfusion.  Significant bilateral lower extreme edema with chronic venous stasis changes, and multiple excoriations and scabs, symmetric Resp:  Normal effort.  Abd:  No distention.  Newness to palpation in the right upper and right lower quadrants Neuro:             Awake, Alert, Oriented x 3  Other:  Dry mucous membranes   ED Results / Procedures / Treatments  Labs (all labs ordered are listed, but only abnormal results are displayed) Labs Reviewed  COMPREHENSIVE METABOLIC PANEL - Abnormal; Notable for the following components:      Result Value   Sodium 133 (*)    Glucose, Bld 139 (*)    BUN 40 (*)    Creatinine, Ser 1.47 (*)    Calcium 10.8 (*)    Albumin 3.0 (*)    GFR, Estimated 34 (*)    All other components within normal limits  LACTIC ACID, PLASMA - Abnormal; Notable for the following components:   Lactic Acid, Venous 3.0 (*)    All other components within normal limits  CBC WITH DIFFERENTIAL/PLATELET - Abnormal; Notable for the following components:   WBC 12.4 (*)    RDW 16.0 (*)    All other components within normal limits  BRAIN NATRIURETIC PEPTIDE - Abnormal; Notable for the following components:   B Natriuretic Peptide 152.4 (*)    All other components within normal limits  RESP PANEL BY RT-PCR (FLU A&B, COVID) ARPGX2  GASTROINTESTINAL PANEL BY PCR, STOOL (REPLACES STOOL CULTURE)  C DIFFICILE QUICK  SCREEN W PCR REFLEX    CULTURE, BLOOD (ROUTINE X 2)  CULTURE, BLOOD (ROUTINE X 2)  LACTIC ACID, PLASMA  URINALYSIS, COMPLETE (UACMP) WITH MICROSCOPIC  PROCALCITONIN  TROPONIN I (HIGH SENSITIVITY)  TROPONIN I (HIGH SENSITIVITY)     EKG  Left axis deviation, A. fib with RVR, diffuse ST depression   RADIOLOGY I reviewed the CXR which does not show any acute cardiopulmonary process; agree with radiology report  PROCEDURES:  Critical Care performed: Yes, see critical care procedure note(s)  .Critical Care Performed by: Rada Hay, MD Authorized by: Rada Hay, MD   Critical care provider statement:    Critical care time (minutes):  30   Critical care was time spent personally by me on the following activities:  Development of treatment plan with patient or surrogate, discussions with consultants, evaluation of patient's response to treatment, examination of patient, ordering and review of laboratory studies, ordering and review of radiographic studies, ordering and performing treatments and interventions, pulse oximetry, re-evaluation of patient's condition and review of old charts .1-3 Lead EKG Interpretation Performed by: Rada Hay, MD Authorized by: Rada Hay, MD     Interpretation: abnormal     ECG rate assessment: tachycardic     Rhythm: atrial fibrillation     Ectopy: none     Conduction: normal    The patient is on the cardiac monitor to evaluate for evidence of arrhythmia and/or significant heart rate changes.   MEDICATIONS ORDERED IN ED: Medications  ceFEPIme (MAXIPIME) 2 g in sodium chloride 0.9 % 100 mL IVPB (has no administration in time range)  vancomycin (VANCOREADY) IVPB 2000 mg/400 mL (has no administration in time range)  sodium chloride 0.9 % bolus 1,000 mL (0 mLs Intravenous Stopped 02/25/21 0529)  sodium chloride 0.9 % bolus 1,000 mL (0 mLs Intravenous Stopped 02/25/21 0719)  iohexol (OMNIPAQUE) 300 MG/ML solution 100  mL (100 mLs Intravenous Contrast Given 02/25/21 4098)     IMPRESSION / MDM / ASSESSMENT AND PLAN / ED COURSE  I reviewed the triage vital signs and the nursing notes.                              Differential diagnosis includes, but is not limited to, viral syndrome, sepsis, colitis, infectious diarrhea, dehydration, metabolic abnormality  The patient is an 88 year old female presents with diarrhea and potentially hematemesis/hemoptysis.  She has had diarrhea for about a week that is nonbloody as well as cough fatigue and decreased p.o. intake.  Difficult to discern whether she has had some streaks of blood in emesis versus cough but does not sound like large volume of either.  She is in A. fib with RVR on arrival heart rate in the 130s.  She has dry mucous membranes and lower extremity edema.  Does have some right upper and lower quadrant tenderness on exam.  Overall appears well.  I suspect that her A. fib might be in the setting of underlying illness so will fluid resuscitate and hold on any rate control agents.  Patient does have a COVID exposure so I question whether this is COVID.  Will obtain labs and CT abdomen pelvis and chest x-ray and give a 2 L bolus.  On review of patient's most recent echocardiogram from 07/07/2020 she had normal systolic function so can likely handle the fluid.  Patient's labs are notable for a lactate of 3, white blood count of 12 mildly elevated BNP.  Chest x-ray does not have any focal findings.  Heart rate improved with fluids.  In the 1 teens currently.  Patient CT abdomen does not have any acute findings.  Heart rate has improved after 2 L of fluids.  Initial lactate elevated at 3 will repeat.  She also has a leukocytosis and slight AKI.  Suspect viral syndrome but will cover empirically with bank cefepime given she is meeting criteria.  Blood cultures sent.  UA and urine culture still pending.  Will admit for further monitoring.  Clinical Course as of 02/25/21  0723  Mon Feb 25, 2021  0411 Lactic Acid, Venous(!!): 3.0 [KM]    Clinical Course User Index [KM] Rada Hay, MD     FINAL CLINICAL IMPRESSION(S) / ED DIAGNOSES   Final diagnoses:  Diarrhea, unspecified type     Rx / DC Orders   ED Discharge Orders     None        Note:  This document was prepared using Dragon voice recognition software and may include unintentional dictation errors.   Rada Hay, MD 02/25/21 [phone]

## 2021-02-25 NOTE — Progress Notes (Signed)
°   02/25/21 1005  Clinical Encounter Type  Visited With Patient  Visit Type Initial;Spiritual support;Social support  Spiritual Encounters  Spiritual Needs Prayer;Other (Comment) (social support)   [place] provided hospitality and compassionate presence. Chaplain Burris provided active listening and social support but also grief support upon learning Pt has lost son only about a year ago. Chaplain Burris offered prayer at [employer] request.

## 2021-02-25 NOTE — ED Notes (Signed)
McHugh MD made aware of pts lactic acid of 3.0

## 2021-02-25 NOTE — ED Notes (Signed)
Pt denies nausea at this time.

## 2021-02-25 NOTE — ED Triage Notes (Signed)
Pt comes to ED via [facility] from home. Pt complains of diarrhea for a week and states that about 30 mins ago had an episode of vomiting with blood. Pt has hx of afib and hx of edema

## 2021-02-25 NOTE — ED Notes (Signed)
Critical lactic of 3.4 called from lab.  IPMD notified.

## 2021-02-25 NOTE — ED Notes (Signed)
Patient transported to CT 

## 2021-02-25 NOTE — H&P (Signed)
History and Physical    [name] ZYY:[id number] DOB: [birth year] DOA: 02/25/2021  Referring MD/NP/PA:   PCP: Ezequiel Kayser, MD (Inactive)   Patient coming from:  [facility]  Chief Complaint: Nausea, vomiting, diarrhea and abdominal pain  HPI: [name] is a 89 y.o. female with medical history significant of hypertension, COPD not on oxygen, asthma, hypothyroidism, gout, depression, OSA on CPAP, PVD, MRSA, dCHF, atrial fibrillation on Eliquis, CKD-3A, UTI, hyperparathyroidism, hypercalcemia, lymphedema, who presents with nausea, vomiting, diarrhea and abdominal pain.  Patient states that her symptoms has been going on for almost a week, including nausea, vomiting, diarrhea and abdominal pain.  She states that she has nonbilious nonbloody vomiting, each time only has small amount of vomitus, few times each day.  Patient also has several times of diarrhea each day.  Her abdominal pain is located in right upper quadrant, mild to moderate, aching, nonradiating.  Patient states that she cannot keep anything down.  Patient does not have fever or chills.  She has dry cough, no shortness breath or chest pain.  No symptoms of UTI. Of note, patient states that she has been coughing and gagging, then bringing up few drops of blood. She is not sure if the blood is from her stomach or lung. Pt has mild erythema, swelling and tenderness in both legs, consistent with mild cellulitis.  Patient initially had A. fib with RVR, EKG showed heart rate 142, which improved to 103, then 74 in ED.  ED Course: pt was found to have WBC 12.4, hemoglobin 12.1, normal liver function, troponin level 15, lactic acid 3.0, BNP 152, negative COVID PCR, slightly worsening renal function, calcium 10.8, temperature normal, blood pressure 122/76, RR 25, oxygen saturation 92% on room air.  Chest x-ray negative.  CT of abdomen/pelvis is negative for acute intra-abdominal issues.  Patient is  admitted to [facility] bed as inpatient.   Review of Systems:   General: no fevers, chills, no body weight gain, has poor appetite, has fatigue HEENT: no blurry vision, hearing changes or sore throat Respiratory: no dyspnea, has coughing, no wheezing CV: no chest pain, no palpitations GI: has nausea, vomiting, abdominal pain, diarrhea, no constipation GU: no dysuria, burning on urination, increased urinary frequency, hematuria  Ext: has leg edema Neuro: no unilateral weakness, numbness, or tingling, no vision change or hearing loss Skin: Pt has mild erythema, swelling and tenderness in both legs MSK: No muscle spasm, no deformity, no limitation of range of movement in spin Heme: No easy bruising.  Travel history: No recent long distant travel.  Allergy:  Allergies  Allergen Reactions   Aspirin Nausea Only   Prednisone     Other reaction(s): Other (See Comments), Unknown   Sulfa Antibiotics Rash   Sulfacetamide Sodium Rash    Past Medical History:  Diagnosis Date   Arthritis    Asthma    CHF (congestive heart failure) (HCC)    COPD (chronic obstructive pulmonary disease) (HCC)    Cough    GERD (gastroesophageal reflux disease)    Gout    Hiatal hernia    Hypertension    Hypothyroidism    MRSA (methicillin resistant staph aureus) culture positive    Neuropathic pain of right lower extremity    Peripheral vascular disease ([place])    "poor circulation"   Seasonal allergies    Shortness of breath dyspnea    Sleep apnea    uses CPAP (sometimes)    Past Surgical  History:  Procedure Laterality Date   ANKLE ARTHROSCOPY     BACK SURGERY     L4-L5 Decompression   CARDIAC CATHETERIZATION  1995   CATARACT EXTRACTION W/PHACO Right 07/19/2014   Procedure: CATARACT EXTRACTION PHACO AND INTRAOCULAR LENS PLACEMENT (IOC);  Surgeon: Leandrew Koyanagi, MD;  Location: [facility];  Service: Ophthalmology;  Laterality: Right;   CATARACT EXTRACTION W/PHACO Left 08/23/2014    Procedure: CATARACT EXTRACTION PHACO AND INTRAOCULAR LENS PLACEMENT (IOC);  Surgeon: Leandrew Koyanagi, MD;  Location: [facility];  Service: Ophthalmology;  Laterality: Left;   CHOLECYSTECTOMY     COLONOSCOPY     PARATHYROIDECTOMY  2003   TONSILLECTOMY     UVULOPALATOPHARYNGOPLASTY      Social History:  reports that she has never smoked. She has been exposed to tobacco smoke. She has never used smokeless tobacco. She reports that she does not drink alcohol and does not use drugs.  Family History:  Family History  Problem Relation Age of Onset   Cancer Mother    Hyperlipidemia Son    Diabetes Son    Cancer Maternal Grandmother      Prior to Admission medications   Medication Sig Start Date End Date Taking? Authorizing Provider  acetaminophen (TYLENOL) 500 MG tablet Take 500-1,000 mg by mouth every 6 (six) hours as needed for mild pain or moderate pain.    [provider]  apixaban (ELIQUIS) 5 MG TABS tablet Take 5 mg by mouth 2 (two) times daily.    [provider]  DULoxetine (CYMBALTA) 60 MG capsule Take 60 mg by mouth daily. 05/04/20   [provider]  furosemide (LASIX) 20 MG tablet Take 1 tablet (20 mg total) by mouth 2 (two) times daily. 07/10/20   Loletha Grayer, MD  ipratropium-albuterol (DUONEB) 0.5-2.5 (3) MG/3ML SOLN Take 3 mLs by nebulization every 6 (six) hours as needed (shortness of breath). 07/10/20   Loletha Grayer, MD  levothyroxine (SYNTHROID) 200 MCG tablet Take 200 mcg by mouth daily. 06/19/20   [provider]  metoprolol succinate (TOPROL-XL) 25 MG 24 hr tablet Take 0.5 tablets (12.5 mg total) by mouth at bedtime. 07/10/20   Loletha Grayer, MD  montelukast (SINGULAIR) 10 MG tablet Take 10 mg by mouth daily.    [provider]  Multiple Vitamins-Minerals (PRESERVISION AREDS 2+MULTI VIT) CAPS Take 1 capsule by mouth as directed.    [provider]  traZODone (DESYREL) 50 MG tablet Take 1 tablet (50 mg  total) by mouth at bedtime as needed for sleep. 07/10/20   Loletha Grayer, MD  VENTOLIN HFA 108 (90 Base) MCG/ACT inhaler Inhale 2 puffs into the lungs every 6 (six) hours as needed for wheezing or shortness of breath.    [provider]  vitamin B-12 (CYANOCOBALAMIN) 500 MCG tablet Take 500 mcg by mouth daily.    [provider]    Physical Exam: Vitals:   02/25/21 0600 02/25/21 0800 02/25/21 1000 02/25/21 1400  BP: 122/76 (!) 121/51 (!) 136/112 133/85  Pulse: 74 88 77 82  Resp: '17 11 16 ' (!) 21  Temp:  98.6 F (37 C) 98.1 F (36.7 C) 98.7 F (37.1 C)  TempSrc:  Oral Oral Oral  SpO2: 100% 100% 100% 98%  Weight:      Height:       General: Not in acute distress HEENT:       Eyes: PERRL, EOMI, no scleral icterus.       ENT: No discharge from the ears and  nose, no pharynx injection, no tonsillar enlargement.        Neck: No JVD, no bruit, no mass felt. Heme: No neck lymph node enlargement. Cardiac: S1/S2, RRR, No murmurs, No gallops or rubs. Respiratory: No rales, wheezing, rhonchi or rubs. GI: Soft, nondistended, has tenderness in RUQ, no rebound pain, no organomegaly, BS present. GU: No hematuria Ext: has trace leg edema bilaterally. 1+DP/PT pulse bilaterally. Musculoskeletal: No joint deformities, No joint redness or warmth, no limitation of ROM in spin. Skin: No rashes. Pt has mild erythema, swelling and tenderness in both legs, consistent with mild cellulitis. Neuro: Alert, oriented X3, cranial nerves II-XII grossly intact, moves all extremities normally. Psych: Patient is not psychotic, no suicidal or hemocidal ideation.  Labs on Admission: I have personally reviewed following labs and imaging studies  CBC: Recent Labs  Lab 02/25/21 0334  WBC 12.4*  NEUTROABS 7.7  HGB 12.1  HCT 37.5  MCV 88.7  PLT 132   Basic Metabolic Panel: Recent Labs  Lab 02/25/21 0334  NA 133*  K 3.8  CL 102  CO2 24  GLUCOSE 139*  BUN 40*  CREATININE 1.47*   CALCIUM 10.8*   GFR: Estimated Creatinine Clearance: 35.2 mL/min (A) (by C-G formula based on SCr of 1.47 mg/dL (H)). Liver Function Tests: Recent Labs  Lab 02/25/21 0334  AST 30  ALT 22  ALKPHOS 102  BILITOT 0.9  PROT 6.6  ALBUMIN 3.0*   No results for input(s): LIPASE, AMYLASE in the last 168 hours. No results for input(s): AMMONIA in the last 168 hours. Coagulation Profile: Recent Labs  Lab 02/25/21 0334  INR 1.7*   Cardiac Enzymes: No results for input(s): CKTOTAL, CKMB, CKMBINDEX, TROPONINI in the last 168 hours. BNP (last 3 results) No results for input(s): PROBNP in the last 8760 hours. HbA1C: No results for input(s): HGBA1C in the last 72 hours. CBG: No results for input(s): GLUCAP in the last 168 hours. Lipid Profile: No results for input(s): CHOL, HDL, LDLCALC, TRIG, CHOLHDL, LDLDIRECT in the last 72 hours. Thyroid Function Tests: No results for input(s): TSH, T4TOTAL, FREET4, T3FREE, THYROIDAB in the last 72 hours. Anemia Panel: No results for input(s): VITAMINB12, FOLATE, FERRITIN, TIBC, IRON, RETICCTPCT in the last 72 hours. Urine analysis:    Component Value Date/Time   COLORURINE YELLOW (A) 02/25/2021 0340   APPEARANCEUR CLEAR (A) 02/25/2021 0340   APPEARANCEUR Clear 01/19/2013 1248   LABSPEC 1.021 02/25/2021 0340   LABSPEC 1.004 01/19/2013 1248   PHURINE 5.0 02/25/2021 0340   GLUCOSEU NEGATIVE 02/25/2021 0340   GLUCOSEU Negative 01/19/2013 1248   HGBUR NEGATIVE 02/25/2021 0340   BILIRUBINUR NEGATIVE 02/25/2021 0340   BILIRUBINUR Negative 01/19/2013 [place] 02/25/2021 0340   PROTEINUR NEGATIVE 02/25/2021 0340   NITRITE NEGATIVE 02/25/2021 0340   LEUKOCYTESUR NEGATIVE 02/25/2021 0340   LEUKOCYTESUR Negative 01/19/2013 1248   Sepsis Labs: '@LABRCNTIP' (procalcitonin:4,lacticidven:4) ) Recent Results (from the past 240 hour(s))  Resp Panel by RT-PCR (Flu A&B, Covid) Nasopharyngeal Swab     Status: None   Collection Time:  02/25/21  3:41 AM   Specimen: Nasopharyngeal Swab; Nasopharyngeal(NP) swabs in vial transport medium  Result Value Ref Range Status   SARS Coronavirus 2 by RT PCR NEGATIVE NEGATIVE Final    Comment: (NOTE) SARS-CoV-2 target nucleic acids are NOT DETECTED.  The SARS-CoV-2 RNA is generally detectable in upper respiratory specimens during the acute phase of infection. The lowest concentration of SARS-CoV-2 viral copies this assay can detect is 138 copies/mL. A  negative result does not preclude SARS-Cov-2 infection and should not be used as the sole basis for treatment or other patient management decisions. A negative result may occur with  improper specimen collection/handling, submission of specimen other than nasopharyngeal swab, presence of viral mutation(s) within the areas targeted by this assay, and inadequate number of viral copies(<138 copies/mL). A negative result must be combined with clinical observations, patient history, and epidemiological information. The expected result is Negative.  Fact Sheet for Patients:  EntrepreneurPulse.com.au  Fact Sheet for Healthcare Providers:  IncredibleEmployment.be  This test is no t yet approved or cleared by the [place] FDA and  has been authorized for detection and/or diagnosis of SARS-CoV-2 by FDA under an Emergency Use Authorization (EUA). This EUA will remain  in effect (meaning this test can be used) for the duration of the COVID-19 declaration under Section 564(b)(1) of the Act, 21 U.S.C.section 360bbb-3(b)(1), unless the authorization is terminated  or revoked sooner.       Influenza A by PCR NEGATIVE NEGATIVE Final   Influenza B by PCR NEGATIVE NEGATIVE Final    Comment: (NOTE) The Xpert Xpress SARS-CoV-2/FLU/RSV plus assay is intended as an aid in the diagnosis of influenza from Nasopharyngeal swab specimens and should not be used as a sole basis for treatment. Nasal washings  and aspirates are unacceptable for Xpert Xpress SARS-CoV-2/FLU/RSV testing.  Fact Sheet for Patients: EntrepreneurPulse.com.au  Fact Sheet for Healthcare Providers: IncredibleEmployment.be  This test is not yet approved or cleared by the [place] FDA and has been authorized for detection and/or diagnosis of SARS-CoV-2 by FDA under an Emergency Use Authorization (EUA). This EUA will remain in effect (meaning this test can be used) for the duration of the COVID-19 declaration under Section 564(b)(1) of the Act, 21 U.S.C. section 360bbb-3(b)(1), unless the authorization is terminated or revoked.  Performed at [facility], [address] [place],  [address]      Radiological Exams on Admission: CT ABDOMEN PELVIS W CONTRAST  Result Date: 02/25/2021 CLINICAL DATA:  Acute, nonlocalized abdominal pain EXAM: CT ABDOMEN AND PELVIS WITH CONTRAST TECHNIQUE: Multidetector CT imaging of the abdomen and pelvis was performed using the standard protocol following bolus administration of intravenous contrast. CONTRAST:  16m OMNIPAQUE IOHEXOL 300 MG/ML  SOLN COMPARISON:  None. FINDINGS: Lower chest: Mild reticulation at the lung bases. Small sliding hiatal hernia. Hepatobiliary: Lobulated liver compatible with cirrhosis.Cholecystectomy. No bile duct dilatation Pancreas: Unremarkable. Spleen: Unremarkable. Adrenals/Urinary Tract: Symmetric thickening of the adrenal glands, up to 13 mm thickness on the left and 16 mm in thickness on the right. No hydronephrosis or stone. Unremarkable bladder. Stomach/Bowel:  No obstruction. No appendicitis. Vascular/Lymphatic: No acute vascular abnormality. Atheromatous calcification of the aorta and iliacs. No mass or adenopathy. Reproductive:No pathologic findings. Other: No ascites or pneumoperitoneum. Musculoskeletal: No acute abnormalities. L3-4 and L4-5 anterolisthesis associated with advanced spinal  degeneration. Lumbar MRI was performed earlier this year. IMPRESSION: 1. No acute finding.  No bowel obstruction or visible inflammation. 2. Cirrhotic liver morphology. 3. Bilateral adrenal thickening, usually adenoma if no malignancy history. Electronically Signed   By: JJorje GuildM.D.   On: 02/25/2021 07:15   DG Chest Portable 1 View  Result Date: 02/25/2021 CLINICAL DATA:  Rule out pneumonia EXAM: PORTABLE CHEST 1 VIEW COMPARISON:  05/09/2020 FINDINGS: Stable heart size and mediastinal contours. No acute infiltrate or edema. No effusion or pneumothorax. No acute osseous findings. IMPRESSION: Stable from prior.  No acute finding. Electronically Signed   By: JRoderic Palau  Watts M.D.   On: 02/25/2021 04:13     EKG: I have personally reviewed.  Atrial fibrillation, QTC 477, heart rate 142, LAD.  Assessment/Plan Principal Problem:   Nausea vomiting and diarrhea Active Problems:   Hypothyroidism   Hypertension   Depression   Hypercalcemia   Hyperparathyroidism (HCC)   OSA (obstructive sleep apnea)   COPD (chronic obstructive pulmonary disease) (HCC)   Atrial fibrillation with RVR (HCC)   Acute renal failure superimposed on stage 3a chronic kidney disease (HCC)   Chronic diastolic CHF (congestive heart failure) (HCC)   Severe sepsis (HCC)   Lower extremity cellulitis   Nausea vomiting and diarrhea and AP: Possibly due to viral gastroenteritis.  CT scan of abdomen/pelvis negative for acute intra-abdominal abnormalities.  -Admitted to MedS[facility] bed as inpatient -Supportive care with IV fluid and as needed Zofran -prn percocet for pain -IV fluid: 2 L normal saline was given in ED -Follow-up C. difficile and GI pathogen panel  Severe sepsis due to lower extremity cellulitis: Patient has mild bilateral lower extremity cellulitis.  Patient meets criteria for severe sepsis with WBC 12.4, tachycardia with heart rate up to 142, tachypnea with RR 24.  Lactic acid is elevated up to 3.0.   Hemodynamically stable.  Patient is taking oral doxycycline at home since 11/29. Pt is on Eliquis, low suspicion for DVT -Switch to IV Rocephin (patient received 1 dose of vancomycin and cefepime by ED) - PRN Zofran for nausea, tylenol and Percocet for pain - Blood cultures x 2  - ESR and CRP - IVF: 2.0 L of NS bolus in ED (patient has congestive heart failure, limiting aggressive IV fluids treatment).  Hypothyroidism -Synthroid  Hypertension -IV hydralazine as needed -Cozaar, metoprolol  Depression -Continue home medications  Hypercalcemia and Hyperparathyroidism ([place]): Calcium 10.8. -Follow-up with BMP for calcium level  OSA (obstructive sleep apnea) -CPAP  COPD (chronic obstructive pulmonary disease) ([place]): Stable -Bronchodilators, Singulair  Atrial fibrillation with RVR (HCC) -Metoprolol -Hold Eliquis since patient has bleeding from her mouth (not sure the source)  Acute renal failure superimposed on stage 3a chronic kidney disease ([place]): Baseline creatinine 1.1-1 01/31/2021.  Her creatinine is 1.47, BUN 40.  Likely due to dehydration and continuation of diuretics. -Hold Lasix -IV fluid as above  Chronic diastolic CHF (congestive heart failure) ([place]): 2D echo on 07/07/2020 showed EF 60 to 65%.  Patient does not have pulm edema chest x-ray.  BNP is mildly elevated at 152, does not seem to have CHF exacerbation. -Hold Lasix due to severe sepsis and worsening renal function      DVT ppx: SCD Code Status: DNR (patient has DNR with her) Family Communication:   Yes, patient's  daughter by phone Disposition Plan:  Anticipate discharge back to previous environment Consults called:  none Admission status and Level of care: Telemetry Medical:   as inpt       Status is: Inpatient  Remains inpatient appropriate because: Patient has multiple comorbidities, now presents with acute gastroenteritis, has worsening renal function, SIRS, A. fib with RVR.  Her presentation is highly  complicated.  Given her older age, patient is at high risk of deteriorating.  Patient will need to be treated in hospital for at least 2 days           Date of Service 02/25/2021    [facility]   If 7PM-7AM, please contact night-coverage [email or web address] 02/25/2021, 3:33 PM

## 2021-02-26 ENCOUNTER — Encounter: Payer: Self-pay | Admitting: Internal Medicine

## 2021-02-26 LAB — CBC
HCT: 31.4 % — ABNORMAL LOW (ref 36.0–46.0)
HCT: 31.4 % — ABNORMAL LOW (ref 36.0–46.0)
HCT: 34.3 % — ABNORMAL LOW (ref 36.0–46.0)
HCT: 34.6 % — ABNORMAL LOW (ref 36.0–46.0)
Hemoglobin: 10.2 g/dL — ABNORMAL LOW (ref 12.0–15.0)
Hemoglobin: 10.3 g/dL — ABNORMAL LOW (ref 12.0–15.0)
Hemoglobin: 11.2 g/dL — ABNORMAL LOW (ref 12.0–15.0)
Hemoglobin: 11.2 g/dL — ABNORMAL LOW (ref 12.0–15.0)
MCH: 28.5 pg (ref 26.0–34.0)
MCH: 28.8 pg (ref 26.0–34.0)
MCH: 28.8 pg (ref 26.0–34.0)
MCH: 28.9 pg (ref 26.0–34.0)
MCHC: 32.4 g/dL (ref 30.0–36.0)
MCHC: 32.5 g/dL (ref 30.0–36.0)
MCHC: 32.7 g/dL (ref 30.0–36.0)
MCHC: 32.8 g/dL (ref 30.0–36.0)
MCV: 87.7 fL (ref 80.0–100.0)
MCV: 88 fL (ref 80.0–100.0)
MCV: 88.4 fL (ref 80.0–100.0)
MCV: 88.7 fL (ref 80.0–100.0)
Platelets: 231 10*3/uL (ref 150–400)
Platelets: 242 10*3/uL (ref 150–400)
Platelets: 247 10*3/uL (ref 150–400)
Platelets: 251 10*3/uL (ref 150–400)
RBC: 3.54 MIL/uL — ABNORMAL LOW (ref 3.87–5.11)
RBC: 3.58 MIL/uL — ABNORMAL LOW (ref 3.87–5.11)
RBC: 3.88 MIL/uL (ref 3.87–5.11)
RBC: 3.93 MIL/uL (ref 3.87–5.11)
RDW: 15.9 % — ABNORMAL HIGH (ref 11.5–15.5)
RDW: 15.9 % — ABNORMAL HIGH (ref 11.5–15.5)
RDW: 16 % — ABNORMAL HIGH (ref 11.5–15.5)
RDW: 16 % — ABNORMAL HIGH (ref 11.5–15.5)
WBC: 8.6 10*3/uL (ref 4.0–10.5)
WBC: 9 10*3/uL (ref 4.0–10.5)
WBC: 9.2 10*3/uL (ref 4.0–10.5)
WBC: 9.9 10*3/uL (ref 4.0–10.5)
nRBC: 0 % (ref 0.0–0.2)
nRBC: 0 % (ref 0.0–0.2)
nRBC: 0 % (ref 0.0–0.2)
nRBC: 0 % (ref 0.0–0.2)

## 2021-02-26 LAB — BASIC METABOLIC PANEL
Anion gap: 4 — ABNORMAL LOW (ref 5–15)
BUN: 30 mg/dL — ABNORMAL HIGH (ref 8–23)
CO2: 23 mmol/L (ref 22–32)
Calcium: 10 mg/dL (ref 8.9–10.3)
Chloride: 107 mmol/L (ref 98–111)
Creatinine, Ser: 1.05 mg/dL — ABNORMAL HIGH (ref 0.44–1.00)
GFR, Estimated: 51 mL/min — ABNORMAL LOW (ref >60)
Glucose, Bld: 94 mg/dL (ref 70–99)
Potassium: 3.7 mmol/L (ref 3.5–5.1)
Sodium: 134 mmol/L — ABNORMAL LOW (ref 135–145)

## 2021-02-26 LAB — PHOSPHORUS: Phosphorus: 2.9 mg/dL (ref 2.5–4.6)

## 2021-02-26 LAB — MAGNESIUM: Magnesium: 1.7 mg/dL (ref 1.7–2.4)

## 2021-02-26 LAB — C-REACTIVE PROTEIN: CRP: 0.9 mg/dL (ref <1.0)

## 2021-02-26 MED ORDER — APIXABAN 5 MG PO TABS
5.0000 mg | ORAL_TABLET | Freq: Two times a day (BID) | ORAL | Status: DC
  Administered 2021-02-26 – 2021-02-28 (×5): 5 mg via ORAL
  Filled 2021-02-26 (×5): qty 1

## 2021-02-26 MED ORDER — IPRATROPIUM-ALBUTEROL 0.5-2.5 (3) MG/3ML IN SOLN
3.0000 mL | Freq: Two times a day (BID) | RESPIRATORY_TRACT | Status: DC
  Administered 2021-02-26 – 2021-02-27 (×3): 3 mL via RESPIRATORY_TRACT
  Filled 2021-02-26 (×4): qty 3

## 2021-02-26 MED ORDER — BOOST / RESOURCE BREEZE PO LIQD CUSTOM
1.0000 | Freq: Three times a day (TID) | ORAL | Status: DC
  Administered 2021-02-26 – 2021-02-28 (×4): 1 via ORAL

## 2021-02-26 NOTE — NC FL2 (Signed)
[place] LEVEL OF CARE SCREENING TOOL     IDENTIFICATION  Patient Name: [name] Birthdate: 1930-05-07 Sex: female Admission Date (Current Location): 02/25/2021  [facility] and [place] Number:  Engineering geologist and Address:  [facility], [address] [place], [place] [address]      Provider Number: [id number]  Attending Physician Name and Address:  Val Riles, MD  Relative Name and Phone Number:  Lisabeth Register [phone]    Current Level of Care: Hospital Recommended Level of Care: [place] Prior Approval Number:    Date Approved/Denied:   PASRR Number: [id number]  Discharge Plan: SNF    Current Diagnoses: Patient Active Problem List   Diagnosis Date Noted   Nausea vomiting and diarrhea 02/25/2021   Atrial fibrillation with RVR ([place]) 02/25/2021   Acute renal failure superimposed on stage 3a chronic kidney disease ([place]) 02/25/2021   Chronic diastolic CHF (congestive heart failure) ([place]) 02/25/2021   Severe sepsis ([place]) 02/25/2021   Lower extremity cellulitis 02/25/2021   Allergic rhinitis 08/10/2020   Peripheral neuropathy 08/10/2020   Acute delirium    Hypomagnesemia    Sepsis due to group B Streptococcus without acute organ dysfunction (HCC)    Acute on chronic diastolic CHF (congestive heart failure) (HCC)    Atrial fibrillation, chronic ([place])    Depression    Sepsis secondary to UTI ([place]) 07/05/2020   COPD with acute exacerbation ([place])    Hypothyroidism    Hypertension    CKD (chronic kidney disease) stage 3, GFR 30-59 ml/min (HCC)    Chronic kidney disease 06/21/2020   Secondary hyperaldosteronism ([place]) 06/16/2020   Stasis dermatitis of both legs 07/28/2017   Pruritic rash 07/03/2017   Gait instability 05/27/2017   CHF (congestive heart failure) ([place]) 03/27/2017   Lower extremity ulceration, left, limited to breakdown of skin ([place]) 03/27/2017   High risk medication use 03/12/2017    Primary hyperparathyroidism ([place]) 03/12/2017   Impaired glucose tolerance 03/12/2017   Noncompliance 03/12/2017   Parathyroid adenoma 03/12/2017   Chronic renal insufficiency, stage 3 (moderate) ([place]) 03/12/2017   Mild reactive airways disease 03/08/2016   Influenza 03/06/2016   Atrial fibrillation ([place]) 03/06/2016   Morbid obesity with BMI of 45.0-49.9, adult ([place]) 02/18/2016   OSA (obstructive sleep apnea) 02/18/2016   CKD (chronic kidney disease), stage II 02/12/2016   Swelling of limb 02/12/2016   Pain in toes of both feet 01/14/2016   Varicose veins of both lower extremities with pain 01/14/2016   Lymphedema 01/14/2016   Longstanding persistent atrial fibrillation ([place]) 10/04/2015   Atrophic vaginitis 12/13/2014   Hypercalcemia 12/13/2014   Hyperparathyroidism ([place]) 12/13/2014   Chronic UTI (urinary tract infection) 11/29/2014   Hyperlipidemia 01/03/2014   Lymphedema of both lower extremities 12/08/2013   SOB (shortness of breath) 12/08/2013   Anxiety 06/15/2013   GERD (gastroesophageal reflux disease) 06/15/2013   Osteoarthritis 06/15/2013   COPD (chronic obstructive pulmonary disease) ([place]) 06/15/2013   Essential hypertension 06/15/2013   Incomplete emptying of bladder 05/25/2013   Mixed incontinence 95/28/4132   Renal colic 44/02/270   Renal cyst, acquired 05/25/2013   Sciatica 05/25/2013   Chronic cystitis 05/25/2013    Orientation RESPIRATION BLADDER Height & Weight     Self, Time, Situation, Place  Normal Continent Weight: 122.1 kg Height:  5\' 5"  (165.1 cm)  BEHAVIORAL SYMPTOMS/MOOD NEUROLOGICAL BOWEL NUTRITION STATUS      Continent Diet (regular)  AMBULATORY STATUS COMMUNICATION OF NEEDS Skin   Extensive Assist  Verbally Normal                       Personal Care Assistance Level of Assistance  Bathing, Feeding, Dressing Bathing Assistance: Limited assistance Feeding assistance: Independent Dressing Assistance: Limited assistance      Functional Limitations Info             SPECIAL CARE FACTORS FREQUENCY  PT (By licensed PT), OT (By licensed OT)     PT Frequency: 5 times per week OT Frequency: 5 times per week            Contractures Contractures Info: Not present    Additional Factors Info  Code Status, Allergies Code Status Info: DNR Allergies Info: Aspirin, Prednisone, Sulfa Antibiotics, Sulfacetamide Sodium           Current Medications (02/26/2021):  This is the current hospital active medication list Current Facility-Administered Medications  Medication Dose Route Frequency Provider Last Rate Last Admin   acetaminophen (TYLENOL) tablet 650 mg  650 mg Oral Q6H PRN Ivor Costa, MD   650 mg at 02/26/21 0929   albuterol (PROVENTIL) (2.5 MG/3ML) 0.083% nebulizer solution 3 mL  3 mL Inhalation Q4H PRN Ivor Costa, MD       cefTRIAXone (ROCEPHIN) 2 g in sodium chloride 0.9 % 100 mL IVPB  2 g Intravenous Q24H Ivor Costa, MD   Stopped at 02/25/21 2001   dextromethorphan-guaiFENesin ([place] DM) 30-600 MG per 12 hr tablet 1 tablet  1 tablet Oral BID PRN Ivor Costa, MD       DULoxetine (CYMBALTA) DR capsule 60 mg  60 mg Oral Daily Ivor Costa, MD   60 mg at 02/26/21 0931   hydrALAZINE (APRESOLINE) injection 5 mg  5 mg Intravenous Q2H PRN Ivor Costa, MD       ipratropium-albuterol (DUONEB) 0.5-2.5 (3) MG/3ML nebulizer solution 3 mL  3 mL Nebulization BID Val Riles, MD       levothyroxine (SYNTHROID) tablet 200 mcg  200 mcg Oral Q0600 Ivor Costa, MD   200 mcg at 02/26/21 4680   losartan (COZAAR) tablet 50 mg  50 mg Oral Daily Ivor Costa, MD   50 mg at 02/26/21 0930   metoprolol succinate (TOPROL-XL) 24 hr tablet 12.5 mg  12.5 mg Oral QHS Ivor Costa, MD   12.5 mg at 02/25/21 2218   montelukast (SINGULAIR) tablet 10 mg  10 mg Oral Daily Ivor Costa, MD   10 mg at 02/26/21 0930   multivitamin-lutein (OCUVITE-LUTEIN) capsule 1 capsule  1 capsule Oral Daily Ivor Costa, MD   1 capsule at 02/26/21 0931   ondansetron  (ZOFRAN) injection 4 mg  4 mg Intravenous Q8H PRN Ivor Costa, MD       oxyCODONE-acetaminophen (PERCOCET/ROXICET) 5-325 MG per tablet 1 tablet  1 tablet Oral Q4H PRN Ivor Costa, MD       traZODone (DESYREL) tablet 50 mg  50 mg Oral QHS PRN Ivor Costa, MD       vitamin B-12 (CYANOCOBALAMIN) tablet 500 mcg  500 mcg Oral Daily Ivor Costa, MD   500 mcg at 02/26/21 0930     Discharge Medications: Please see discharge summary for a list of discharge medications.  Relevant Imaging Results:  Relevant Lab Results:   Additional Information SS# [id number]  Conception Oms, RN

## 2021-02-26 NOTE — Evaluation (Signed)
Physical Therapy Evaluation Patient Details Name: [name] MRN: [id number] DOB: Jan 07, 1931 Today's Date: 02/26/2021  History of Present Illness  88 y.o. female with medical history significant of hypertension, COPD not on oxygen, asthma, hypothyroidism, gout, depression, OSA on CPAP, PVD, MRSA, dCHF, atrial fibrillation on Eliquis, CKD-3A, UTI, hyperparathyroidism, hypercalcemia, lymphedema, who presents with nausea, vomiting, diarrhea and abdominal pain lasting >1 week.   Clinical Impression  Pt received supine in bed, agreeable to therapy. Right away, she states she is not ready to go to a skilled nursing facility. Pt lives at home alone. Her granddaughter stays with her 3 nights/week. Per pt, her apartment management will not allow her family to stay with her more than 3 nights/week; she will have to hire a "trained caregiver" in order to have caregiver assistance 24/7. Pt does admit that she has had a more difficult time managing at home alone recently. She sleeps in her lift chair and ambulates using Rollator.  Pt performed bed mobility with SUP, STS and ambulation with CGA for steadying while using RW. STS required multiple attempts; she was unable to perform until surface was elevated. She required increased time and had difficulty pulling her brief on; incontinent in standing. Pt ambulated 90ft with fatigue and SOB upon completion. Encouragement to complete final 5 feet of ambulation. PT concerned about pt returning home alone due to risk of falling (significant history of falls). Pt requires either 24-hour assist of caregiver or SNF to address functional limitations and practice safe mobility prior to returning home. Would benefit from skilled PT to address above deficits and promote optimal return to PLOF.      Recommendations for follow up therapy are one component of a multi-disciplinary discharge planning process, led by the attending physician.  Recommendations may be updated based  on patient status, additional functional criteria and insurance authorization.  Follow Up Recommendations Skilled nursing-short term rehab (<3 hours/day)    Assistance Recommended at Discharge Frequent or constant Supervision/Assistance  Patient can return home with the following  A little help with walking and/or transfers;A little help with bathing/dressing/bathroom;Assist for transportation;Assistance with cooking/housework;Other (comment) (if able to get 24-hour caregiver to stay with her)    Equipment Recommendations None recommended by PT  Recommendations for Other Services       Functional Status Assessment Patient has had a recent decline in their functional status and demonstrates the ability to make significant improvements in function in a reasonable and predictable amount of time.     Precautions / Restrictions Precautions Precautions: Fall Restrictions Weight Bearing Restrictions: No      Mobility  Bed Mobility Overal bed mobility: Needs Assistance Bed Mobility: Supine to Sit     Supine to sit: Supervision     General bed mobility comments: increased time and safety    Transfers Overall transfer level: Needs assistance Equipment used: Rolling walker (2 wheels) Transfers: Sit to/from Stand Sit to Stand: Min guard;From elevated surface           General transfer comment: CGA for safety. Multiple attempts to stand from EOB - did require surface to be elevated.    Ambulation/Gait Ambulation/Gait assistance: Min guard Gait Distance (Feet): 15 Feet Assistive device: Rolling walker (2 wheels) Gait Pattern/deviations: Step-through pattern;Trunk flexed Gait velocity: decreased     General Gait Details: Decreased foot clearance bilaterally, reliant on support of RW. Decreased cadence and gait velocity. Fatigue after 15 feet.  Stairs            Wheelchair  Mobility    Modified Rankin (Stroke Patients Only)       Balance Overall balance  assessment: Needs assistance Sitting-balance support: No upper extremity supported;Feet supported Sitting balance-Leahy Scale: Fair Sitting balance - Comments: able to don disposable brief sitting EOB   Standing balance support: Bilateral upper extremity supported;During functional activity;Reliant on assistive device for balance Standing balance-Leahy Scale: Poor Standing balance comment: reliant on UE support during standing and ambulation. CGA at all time for safety and mild steadying to complete pulling up brief.                             Pertinent Vitals/Pain Pain Assessment: No/denies pain    Home Living Family/patient expects to be discharged to:: Private residence Living Arrangements: Alone Available Help at Discharge: Family;Available PRN/intermittently Type of Home: Apartment Home Access: Level entry       Home Layout: One level Home Equipment: Rollator (4 wheels);BSC/3in1;Hospital bed;Other (comment);Grab bars - toilet;Grab bars - tub/shower (lift recliner)      Prior Function Prior Level of Function : Independent/Modified Independent             Mobility Comments: uses Rollator for all ambulation ADLs Comments: Indepeent with ADLs and most IADLs. Recieves 1 meal/day from [employer]; daughter/granddaughter does grocery shopping.     Hand Dominance        Extremity/Trunk Assessment   Upper Extremity Assessment Upper Extremity Assessment: Generalized weakness    Lower Extremity Assessment Lower Extremity Assessment: Generalized weakness       Communication   Communication: No difficulties  Cognition Arousal/Alertness: Awake/alert Behavior During Therapy: WFL for tasks assessed/performed Overall Cognitive Status: Within Functional Limits for tasks assessed                                 General Comments: A&Ox4        General Comments      Exercises     Assessment/Plan    PT Assessment Patient needs  continued PT services  PT Problem List Decreased strength;Decreased mobility;Decreased safety awareness;Decreased activity tolerance;Decreased balance       PT Treatment Interventions DME instruction;Therapeutic activities;Gait training;Therapeutic exercise;Balance training;Functional mobility training;Neuromuscular re-education;Patient/family education    PT Goals (Current goals can be found in the Care Plan section)  Acute Rehab PT Goals Patient Stated Goal: to go home PT Goal Formulation: With patient Time For Goal Achievement: 03/12/21 Potential to Achieve Goals: Fair    Frequency Min 2X/week     Co-evaluation               AM-PAC PT "6 Clicks" Mobility  Outcome Measure Help needed turning from your back to your side while in a flat bed without using bedrails?: A Little Help needed moving from lying on your back to sitting on the side of a flat bed without using bedrails?: A Little Help needed moving to and from a bed to a chair (including a wheelchair)?: A Little Help needed standing up from a chair using your arms (e.g., wheelchair or bedside chair)?: A Little Help needed to walk in hospital room?: A Little Help needed climbing 3-5 steps with a railing? : Total 6 Click Score: 16    End of Session Equipment Utilized During Treatment: Gait belt Activity Tolerance: Patient tolerated treatment well;Patient limited by fatigue Patient left: in chair;with call bell/phone within reach;with chair alarm set Nurse Communication:  Mobility status;Precautions PT Visit Diagnosis: Unsteadiness on feet (R26.81);History of falling (Z91.81);Muscle weakness (generalized) (M62.81);Other abnormalities of gait and mobility (R26.89);Difficulty in walking, not elsewhere classified (R26.2)    Time: 8381-8403 PT Time Calculation (min) (ACUTE ONLY): 46 min   Charges:   PT Evaluation $PT Eval Moderate Complexity: 1 Mod PT Treatments $Gait Training: 8-22 mins $Therapeutic Activity: 23-37  mins        Patrina Levering PT, DPT 02/26/21 12:16 PM [phone]

## 2021-02-26 NOTE — Evaluation (Signed)
Occupational Therapy Evaluation Patient Details Name: [name] MRN: [id number] DOB: November 18, 1930 Today's Date: 02/26/2021   History of Present Illness 88 y.o. female with medical history significant of hypertension, COPD not on oxygen, asthma, hypothyroidism, gout, depression, OSA on CPAP, PVD, MRSA, dCHF, atrial fibrillation on Eliquis, CKD-3A, UTI, hyperparathyroidism, hypercalcemia, lymphedema, who presents with nausea, vomiting, diarrhea and abdominal pain lasting >1 week.   Clinical Impression   Pt was seen for OT evaluation this date. Prior to hospital admission, pt was living by herself in a senior living apartment, and grossly independent with ADLs and most IADLs. She receives 1 meal/day from [employer] and her daughter/granddaughter handles grocery shopping and transportation needs. She only takes a shower using walk-in when someone else is there for safety. Generally independent with med mgt and financial mgt (PRN assist from dtr). Pt received seated on [facility] with nurse tech upon arrival. Pt provided with set up for pericare using lateral lean and SBA. She transfers with RW requiring VC for hand placement and CGA for safety/balance. Pt generally weak and HR up to high 120's to low 130's with minimal exertion. Pt requires PRN MIN A for LB ADL and additional time/effort for all ADL/mobility tasks. Currently pt demonstrates impairments as described below (See OT problem list) which functionally limit her ability to perform ADL/self-care tasks. Pt educated in DME for toileting to increase height of toilets and improve safety with toilet transfers and educated in ECS and falls prevention strategies to [employer] safety. Pt verbalized understanding and would benefit from skilled OT services to address noted impairments and functional limitations (see below for any additional details) in order to maximize safety and independence while minimizing falls risk and caregiver burden. Upon hospital  discharge, recommend STR to maximize pt safety and return to PLOF.     Recommendations for follow up therapy are one component of a multi-disciplinary discharge planning process, led by the attending physician.  Recommendations may be updated based on patient status, additional functional criteria and insurance authorization.   Follow Up Recommendations  Skilled nursing-short term rehab (<3 hours/day)    Assistance Recommended at Discharge Frequent or constant Supervision/Assistance  Patient can return home with the following A little help with walking and/or transfers;A little help with bathing/dressing/bathroom;Assistance with cooking/housework;Assist for transportation    Functional Status Assessment  Patient has had a recent decline in their functional status and demonstrates the ability to make significant improvements in function in a reasonable and predictable amount of time.  Equipment Recommendations  None recommended by OT    Recommendations for Other Services       Precautions / Restrictions Precautions Precautions: Fall Restrictions Weight Bearing Restrictions: No      Mobility Bed Mobility Overal bed mobility: Needs Assistance Bed Mobility: Sit to Supine     Supine to sit: Supervision Sit to supine: Min guard   General bed mobility comments: CGA briefly for LLE mgt back to bed    Transfers Overall transfer level: Needs assistance Equipment used: Rolling walker (2 wheels) Transfers: Sit to/from Stand Sit to Stand: Min guard;From elevated surface           General transfer comment: increased time/effort, CGA for safety, VC for hand placement      Balance Overall balance assessment: Needs assistance Sitting-balance support: No upper extremity supported;Feet supported Sitting balance-Leahy Scale: Fair Sitting balance - Comments: uses figure 4 for LB dressing without LOB EOB   Standing balance support: Bilateral upper extremity supported;No upper  extremity  supported;During functional activity;Reliant on assistive device for balance Standing balance-Leahy Scale: Poor Standing balance comment: briefly able to let go of RW while completing donning of brief over her hips in standing standing, unsteady                           ADL either performed or assessed with clinical judgement   ADL Overall ADL's : Needs assistance/impaired                                       General ADL Comments: Pt requires PRN MIN A for LB ADL tasks, CGA for ADL transfers + VC for RW mgt     Vision         Perception     Praxis      Pertinent Vitals/Pain Pain Assessment: No/denies pain     Hand Dominance     Extremity/Trunk Assessment Upper Extremity Assessment Upper Extremity Assessment: Generalized weakness   Lower Extremity Assessment Lower Extremity Assessment: Generalized weakness       Communication Communication Communication: No difficulties   Cognition Arousal/Alertness: Awake/alert Behavior During Therapy: WFL for tasks assessed/performed Overall Cognitive Status: Within Functional Limits for tasks assessed                                 General Comments: A&Ox4     General Comments       Exercises Other Exercises Other Exercises: pt educated in DME for toileting to increase height of toilets and improve safety with toilet transfers Other Exercises: Pt educated in ECS and falls prevention strategies to maximzie safety   Shoulder Instructions      Home Living Family/patient expects to be discharged to:: Private residence Living Arrangements: Alone Available Help at Discharge: Family;Available PRN/intermittently Type of Home: Apartment Home Access: Level entry     Home Layout: One level     Bathroom Shower/Tub: Occupational psychologist: Standard     Home Equipment: Rollator (4 wheels);BSC/3in1;Hospital bed;Other (comment);Grab bars - toilet;Grab bars -  tub/shower;Shower seat (Risk analyst)   Additional Comments: shower chair is missing grips on the feet - unsafe as is      Prior Functioning/Environment Prior Level of Function : Independent/Modified Independent             Mobility Comments: uses Rollator for all ambulation ADLs Comments: Independent with ADLs and most IADLs. Recieves 1 meal/day from [employer]; daughter/granddaughter does grocery shopping, transportation. Only takes a shower using walk-in when someone else is there for safety. Generally independent with med mgt and financial mgt (PRN assist from dtr)        OT Problem List: Decreased strength;Decreased activity tolerance;Impaired balance (sitting and/or standing);Decreased knowledge of use of DME or AE;Obesity;Cardiopulmonary status limiting activity      OT Treatment/Interventions: Self-care/ADL training;Therapeutic exercise;Therapeutic activities;Energy conservation;DME and/or AE instruction;Patient/family education;Balance training    OT Goals(Current goals can be found in the care plan section) Acute Rehab OT Goals Patient Stated Goal: get better and go home OT Goal Formulation: With patient Time For Goal Achievement: 03/12/21 Potential to Achieve Goals: Good ADL Goals Pt Will Transfer to Toilet: with modified independence;ambulating (LRAD PRN, elevated commode w/ rails) Additional ADL Goal #1: Pt will verbalize plan to implement at least 2 learned ECS to support  ADL/IADL participation and minimize falls risk. Additional ADL Goal #2: Pt will perform seated bath with set up and remote supervision for safety.  OT Frequency: Min 2X/week    Co-evaluation              AM-PAC OT "6 Clicks" Daily Activity     Outcome Measure Help from another person eating meals?: None Help from another person taking care of personal grooming?: A Little Help from another person toileting, which includes using toliet, bedpan, or urinal?: A Little Help from another  person bathing (including washing, rinsing, drying)?: A Little Help from another person to put on and taking off regular upper body clothing?: None Help from another person to put on and taking off regular lower body clothing?: A Little 6 Click Score: 20   End of Session Equipment Utilized During Treatment: Rolling walker (2 wheels)  Activity Tolerance: Patient tolerated treatment well Patient left: in bed;with call bell/phone within reach;with bed alarm set;with SCD's reapplied  OT Visit Diagnosis: Other abnormalities of gait and mobility (R26.89);Repeated falls (R29.6);Muscle weakness (generalized) (M62.81)                Time: 9163-8466 OT Time Calculation (min): 29 min Charges:  OT General Charges $OT Visit: 1 Visit OT Evaluation $OT Eval Moderate Complexity: 1 Mod OT Treatments $Self Care/Home Management : 8-22 mins  Ardeth Perfect., MPH, MS, OTR/L ascom [phone] 02/26/21, 3:44 PM

## 2021-02-26 NOTE — Progress Notes (Signed)
[facility] Progress Note  Patient: [name]    RJJ:[id number]  DOA: 02/25/2021     Date of Service: the patient was seen and examined on 02/26/2021  No chief complaint on file.  Brief hospital course: [name] is a 88 y.o. female with medical history significant of hypertension, COPD not on oxygen, asthma, hypothyroidism, gout, depression, OSA on CPAP, PVD, MRSA, dCHF, atrial fibrillation on Eliquis, CKD-3A, UTI, hyperparathyroidism, hypercalcemia, lymphedema, who presents with nausea, vomiting, diarrhea and abdominal pain. Patient initially had A. fib with RVR, EKG showed heart rate 142, which improved to 103, then 74 in ED.   ED Course: pt was found to have WBC 12.4, hemoglobin 12.1, normal liver function, troponin level 15, lactic acid 3.0, BNP 152, negative COVID PCR, slightly worsening renal function, calcium 10.8, temperature normal, blood pressure 122/76, RR 25, oxygen saturation 92% on room air.  Chest x-ray negative.  CT of abdomen/pelvis is negative for acute intra-abdominal issues.  Patient is admitted to [facility] bed as inpatient    Assessment and Plan: Principal Problem:   Nausea vomiting and diarrhea Active Problems:   Hypothyroidism   Hypertension   Depression   Hypercalcemia   Hyperparathyroidism (HCC)   OSA (obstructive sleep apnea)   COPD (chronic obstructive pulmonary disease) (HCC)   Atrial fibrillation with RVR (HCC)   Acute renal failure superimposed on stage 3a chronic kidney disease (HCC)   Chronic diastolic CHF (congestive heart failure) (HCC)   Severe sepsis (HCC)   Lower extremity cellulitis     Nausea vomiting and diarrhea and AP: Possibly due to viral gastroenteritis.  CT scan of abdomen/pelvis negative for acute intra-abdominal abnormalities. supportive care with IV fluid and as needed Zofran -prn percocet for pain -IV fluid: 2 L normal saline was given in ED 1/3 no diarrhea or vomiting since Sunday, patient has no any GI symptoms,  diet advanced from clear liquid to heart healthy.   Severe sepsis due to lower extremity cellulitis: Patient has mild bilateral lower extremity cellulitis.  Patient meets criteria for severe sepsis with WBC 12.4, tachycardia with heart rate up to 142, tachypnea with RR 24.  Lactic acid is elevated up to 3.0.  Hemodynamically stable.  Patient is taking oral doxycycline at home since 11/29. Pt is on Eliquis, low suspicion for DVT -Switch to IV Rocephin (patient received 1 dose of vancomycin and cefepime by ED) - PRN Zofran for nausea, tylenol and Percocet for pain - Blood cultures x 2  - ESR and CRP - IVF: 2.0 L of NS bolus in ED (patient has congestive heart failure, limiting aggressive IV fluids treatment).   Hypothyroidism -Synthroid   Hypertension -IV hydralazine as needed -Cozaar, metoprolol   Depression -Continue home medications   Hypercalcemia and Hyperparathyroidism ([place]): Calcium 10.8. -Follow-up with BMP for calcium level   OSA (obstructive sleep apnea) -CPAP   COPD (chronic obstructive pulmonary disease) ([place]): Stable -Bronchodilators, Singulair   Atrial fibrillation with RVR (HCC) -Metoprolol -resumed Eliquis as H/H stable, it was held since patient has bleeding from her mouth (not sure the source)   Acute renal failure superimposed on stage 3a chronic kidney disease ([place]): Baseline creatinine 1.1-1 01/31/2021.  Her creatinine is 1.47, BUN 40.  Likely due to dehydration and continuation of diuretics. -Hold Lasix -IV fluid as above   Chronic diastolic CHF (congestive heart failure) ([place]): 2D echo on 07/07/2020 showed EF 60 to 65%.  Patient does not have pulm edema chest x-ray.  BNP is mildly elevated at  152, does not seem to have CHF exacerbation. -Hold Lasix due to severe sepsis and worsening renal function   Body mass index is 44.79 kg/m.  Nutrition Problem: Inadequate oral intake Etiology: altered GI function Interventions: Interventions: Boost Breeze,  MVI  Pressure Injury 12/07/15 Stage I -  Intact skin with non-blanchable redness of a localized area usually over a bony prominence. Redness noted to buttocks, water blister turned into sore as well  (Active)  12/07/15 1533  Location: Buttocks  Location Orientation: Left  Staging: Stage I -  Intact skin with non-blanchable redness of a localized area usually over a bony prominence.  Wound Description (Comments): Redness noted to buttocks, water blister turned into sore as well   Present on Admission: Yes     Diet: Heart healthy DVT Prophylaxis: Therapeutic Anticoagulation with Eliquis    Advance goals of care discussion: DNR  Family Communication: family was NOT present at bedside, at the time of interview.  The pt provided permission to discuss medical plan with the family. Opportunity was given to ask question and all questions were answered satisfactorily.   Disposition:  [facility], admitted with nausea vomiting diarrhea and A. fib with RVR, improving, heart rate under control now.  Continue monitor on telemetry overnight, seen by PT and OT, recommended SNF placement Discharge to SNF, when bed available most likely in 1 to 2 days.  Subjective: No significant overnight issues  Physical Exam: General:  alert oriented to time, place, and person.  Appear in no distress, affect appropriate Eyes: PERRLA ENT: Oral Mucosa Clear, moist  Neck: no JVD,  Cardiovascular: S1 and S2 Present, no Murmur,  Respiratory: good respiratory effort, Bilateral Air entry equal and Decreased, no Crackles, no wheezes Abdomen: Bowel Sound present, Soft and no tenderness,  Skin: no rashes Extremities: mild Pedal edema, no calf tenderness Neurologic: without any new focal findings Gait not checked due to patient safety concerns  Vitals:   02/26/21 0226 02/26/21 0308 02/26/21 0725 02/26/21 1151  BP: 118/70  94/72 122/67  Pulse: 69  77 80  Resp: '20  15 16  ' Temp: 98.2 F (36.8  C)  (!) 97.5 F (36.4 C) 97.7 F (36.5 C)  TempSrc:      SpO2: 100% 98% 95% 100%  Weight:      Height:        Intake/Output Summary (Last 24 hours) at 02/26/2021 1443 Last data filed at 02/26/2021 0600 Gross per 24 hour  Intake --  Output 750 ml  Net -750 ml   Filed Weights   02/25/21 0339 02/26/21 0223  Weight: 125.1 kg 122.1 kg    Data Reviewed: I have personally reviewed and interpreted daily labs, tele strips, imagings as discussed above. I reviewed all nursing notes, pharmacy notes, vitals, pertinent old records I have discussed plan of care as described above with RN and patient/family.  CBC: Recent Labs  Lab 02/25/21 0334 02/25/21 1826 02/26/21 0221 02/26/21 0814  WBC 12.4* 10.4 9.9 9.2  NEUTROABS 7.7  --   --   --   HGB 12.1 11.5* 11.2* 10.3*  HCT 37.5 35.6* 34.6* 31.4*  MCV 88.7 88.8 88.0 87.7  PLT 294 266 251 161   Basic Metabolic Panel: Recent Labs  Lab 02/25/21 0334 02/26/21 0221 02/26/21 0819  NA 133* 134*  --   K 3.8 3.7  --   CL 102 107  --   CO2 24 23  --   GLUCOSE 139* 94  --  BUN 40* 30*  --   CREATININE 1.47* 1.05*  --   CALCIUM 10.8* 10.0  --   MG  --   --  1.7  PHOS  --   --  2.9    Studies: No results found.  Scheduled Meds:  apixaban  5 mg Oral BID   DULoxetine  60 mg Oral Daily   feeding supplement  1 Container Oral TID BM   ipratropium-albuterol  3 mL Nebulization BID   levothyroxine  200 mcg Oral Q0600   losartan  50 mg Oral Daily   metoprolol succinate  12.5 mg Oral QHS   montelukast  10 mg Oral Daily   multivitamin-lutein  1 capsule Oral Daily   vitamin B-12  500 mcg Oral Daily   Continuous Infusions:  cefTRIAXone (ROCEPHIN)  IV Stopped (02/25/21 2001)   PRN Meds: acetaminophen, albuterol, dextromethorphan-guaiFENesin, hydrALAZINE, ondansetron (ZOFRAN) IV, oxyCODONE-acetaminophen, traZODone  Time spent: 35 minutes  Author: Val Riles. MD [facility] 02/26/2021 2:43 PM  To reach On-call, see care teams  to locate the attending and reach out to them via [email or web address]. If 7PM-7AM, please contact night-coverage If you still have difficulty reaching the attending provider, please page the [facility] (Director on Call) for [facility] on amion for assistance.

## 2021-02-26 NOTE — TOC Initial Note (Signed)
Transition of Care [facility]) - Initial/Assessment Note    Patient Details  Name: [name] MRN: [id number] Date of Birth: [birth year]  Transition of Care [facility]) CM/SW Contact:    Conception Oms, RN Phone Number: 02/26/2021, 1:42 PM  Clinical Narrative:                 Met with the patient to discuss DC plan and needs, She is agreeable to a Bedsearch top go to STR SNF,Bedsearch sent, PASSR obtained, FL2 completed        Patient Goals and CMS Choice        Expected Discharge Plan and Services                                                Prior Living Arrangements/Services                       Activities of Daily Living Home Assistive Devices/Equipment: Walker (specify type), CPAP, Eyeglasses ADL Screening (condition at time of admission) Patient's cognitive ability adequate to safely complete daily activities?: Yes Is the patient deaf or have difficulty hearing?: No Does the patient have difficulty seeing, even when wearing glasses/contacts?: No Does the patient have difficulty concentrating, remembering, or making decisions?: No Patient able to express need for assistance with ADLs?: Yes Does the patient have difficulty dressing or bathing?: Yes Independently performs ADLs?: Yes (appropriate for developmental age) Does the patient have difficulty walking or climbing stairs?: Yes Weakness of Legs: Both Weakness of Arms/Hands: None  Permission Sought/Granted                  Emotional Assessment              Admission diagnosis:  Nausea vomiting and diarrhea [R11.2, R19.7] Diarrhea, unspecified type [R19.7] Patient Active Problem List   Diagnosis Date Noted   Nausea vomiting and diarrhea 02/25/2021   Atrial fibrillation with RVR ([place]) 02/25/2021   Acute renal failure superimposed on stage 3a chronic kidney disease ([place]) 02/25/2021   Chronic diastolic CHF (congestive heart failure) ([place]) 02/25/2021   Severe sepsis ([place]) 02/25/2021    Lower extremity cellulitis 02/25/2021   Allergic rhinitis 08/10/2020   Peripheral neuropathy 08/10/2020   Acute delirium    Hypomagnesemia    Sepsis due to group B Streptococcus without acute organ dysfunction ([place])    Acute on chronic diastolic CHF (congestive heart failure) ([place])    Atrial fibrillation, chronic ([place])    Depression    Sepsis secondary to UTI ([place]) 07/05/2020   COPD with acute exacerbation ([place])    Hypothyroidism    Hypertension    CKD (chronic kidney disease) stage 3, GFR 30-59 ml/min (HCC)    Chronic kidney disease 06/21/2020   Secondary hyperaldosteronism ([place]) 06/16/2020   Stasis dermatitis of both legs 07/28/2017   Pruritic rash 07/03/2017   Gait instability 05/27/2017   CHF (congestive heart failure) ([place]) 03/27/2017   Lower extremity ulceration, left, limited to breakdown of skin ([place]) 03/27/2017   High risk medication use 03/12/2017   Primary hyperparathyroidism ([place]) 03/12/2017   Impaired glucose tolerance 03/12/2017   Noncompliance 03/12/2017   Parathyroid adenoma 03/12/2017   Chronic renal insufficiency, stage 3 (moderate) ([place]) 03/12/2017   Mild reactive airways disease 03/08/2016   Influenza 03/06/2016   Atrial fibrillation ([place]) 03/06/2016   Morbid obesity  with BMI of 45.0-49.9, adult ([place]) 02/18/2016   OSA (obstructive sleep apnea) 02/18/2016   CKD (chronic kidney disease), stage II 02/12/2016   Swelling of limb 02/12/2016   Pain in toes of both feet 01/14/2016   Varicose veins of both lower extremities with pain 01/14/2016   Lymphedema 01/14/2016   Longstanding persistent atrial fibrillation ([place]) 10/04/2015   Atrophic vaginitis 12/13/2014   Hypercalcemia 12/13/2014   Hyperparathyroidism ([place]) 12/13/2014   Chronic UTI (urinary tract infection) 11/29/2014   Hyperlipidemia 01/03/2014   Lymphedema of both lower extremities 12/08/2013   SOB (shortness of breath) 12/08/2013   Anxiety 06/15/2013   GERD (gastroesophageal reflux disease)  06/15/2013   Osteoarthritis 06/15/2013   COPD (chronic obstructive pulmonary disease) ([place]) 06/15/2013   Essential hypertension 06/15/2013   Incomplete emptying of bladder 05/25/2013   Mixed incontinence 16/11/9602   Renal colic 54/10/8117   Renal cyst, acquired 05/25/2013   Sciatica 05/25/2013   Chronic cystitis 05/25/2013   PCP:  Ezequiel Kayser, MD (Inactive) Pharmacy:   [id number] [address], [place] AT [address] [place] [address] Phone: [phone] Fax: [phone]     Social Determinants of Health (SDOH) Interventions    Readmission Risk Interventions No flowsheet data found.

## 2021-02-26 NOTE — Plan of Care (Signed)
Patient arrived to room 135 in stable condition. Aox4. No complaints of pain or nausea/vomiting. Scattered scratch marks noted on body. Plan of care reviewed with patient. Call bell usage explained.   PLAN OF CARE ONGOING  Problem: Education: Goal: Knowledge of General Education information will improve Description: Including pain rating scale, medication(s)/side effects and non-pharmacologic comfort measures Outcome: Progressing   Problem: Health Behavior/Discharge Planning: Goal: Ability to manage health-related needs will improve Outcome: Progressing   Problem: Clinical Measurements: Goal: Ability to maintain clinical measurements within normal limits will improve Outcome: Progressing Goal: Will remain free from infection Outcome: Progressing Goal: Diagnostic test results will improve Outcome: Progressing Goal: Respiratory complications will improve Outcome: Progressing Goal: Cardiovascular complication will be avoided Outcome: Progressing   Problem: Activity: Goal: Risk for activity intolerance will decrease Outcome: Progressing   Problem: Nutrition: Goal: Adequate nutrition will be maintained Outcome: Progressing   Problem: Coping: Goal: Level of anxiety will decrease Outcome: Progressing   Problem: Elimination: Goal: Will not experience complications related to bowel motility Outcome: Progressing Goal: Will not experience complications related to urinary retention Outcome: Progressing   Problem: Pain Managment: Goal: General experience of comfort will improve Outcome: Progressing   Problem: Safety: Goal: Ability to remain free from injury will improve Outcome: Progressing   Problem: Skin Integrity: Goal: Risk for impaired skin integrity will decrease Outcome: Progressing

## 2021-02-26 NOTE — Progress Notes (Signed)
Initial Nutrition Assessment  DOCUMENTATION CODES:   Obesity unspecified  INTERVENTION:   -MVI with minerals daily -Boost Breeze po TID, each supplement provides 250 kcal and 9 grams of protein  -RD will follow for diet advancement and adjust supplement regimen as appropriate  NUTRITION DIAGNOSIS:   Inadequate oral intake related to altered GI function as evidenced by per patient/family report.  GOAL:   Patient will meet greater than or equal to 90% of their needs  MONITOR:   PO intake, Supplement acceptance, Diet advancement, Labs, Weight trends, Skin, I & O's  REASON FOR ASSESSMENT:   Malnutrition Screening Tool    ASSESSMENT:   [name] is a 88 y.o. female with medical history significant of hypertension, COPD not on oxygen, asthma, hypothyroidism, gout, depression, OSA on CPAP, PVD, MRSA, dCHF, atrial fibrillation on Eliquis, CKD-3A, UTI, hyperparathyroidism, hypercalcemia, lymphedema, who presents with nausea, vomiting, diarrhea and abdominal pain.  Pt admitted with nausea, vomiting, and diarrhea.   Reviewed I/O's: +350 ml x 24 hours  UOP: 750 ml x 24 hours  Pt sleeping soundly at time of visit. She did not arouse to voice or touch.   Pt just advanced to clear liquid diet. No meal completion data to assess at this time. Per H&P, pt with decreased oral intake for 2-3 weeks PTA secondary to GI illness.   Reviewed wt hx; pt has experienced a 4.2% wt loss over the past 6 months, which is significant for time frame.   Medications reviewed and include vitamin B-12.   Labs reviewed: Na: 134.   NUTRITION - FOCUSED PHYSICAL EXAM:  Flowsheet Row Most Recent Value  Orbital Region No depletion  Upper Arm Region No depletion  Thoracic and Lumbar Region No depletion  Buccal Region No depletion  Temple Region No depletion  Clavicle Bone Region No depletion  Clavicle and Acromion Bone Region No depletion  Scapular Bone Region No depletion  Dorsal Hand No  depletion  Patellar Region No depletion  Anterior Thigh Region No depletion  Posterior Calf Region No depletion  Edema (RD Assessment) Mild  Hair Reviewed  Eyes Reviewed  Mouth Reviewed  Skin Reviewed  Nails Reviewed       Diet Order:   Diet Order             Diet clear liquid Room service appropriate? Yes; Fluid consistency: Thin  Diet effective now                   EDUCATION NEEDS:   No education needs have been identified at this time  Skin:  Skin Assessment: Skin Integrity Issues: Skin Integrity Issues:: Other (Comment) Other: ITD bilateral groin  Last BM:  02/24/21  Height:   Ht Readings from Last 1 Encounters:  02/25/21 5\' 5"  (1.651 m)    Weight:   Wt Readings from Last 1 Encounters:  02/26/21 122.1 kg    Ideal Body Weight:  56.8 kg  BMI:  Body mass index is 44.79 kg/m.  Estimated Nutritional Needs:   Kcal:  1500-1700  Protein:  85-100 grams  Fluid:  > 1.5 L    Loistine Chance, RD, LDN, [place] Registered Dietitian II Certified Diabetes Care and Education Specialist Please refer to [facility] for RD and/or RD on-call/weekend/after hours pager

## 2021-02-26 NOTE — Plan of Care (Signed)

## 2021-02-26 NOTE — ED Notes (Signed)
This RN changed pt d/t purewick leakage, provided clean brief, peri care and barrier cream.

## 2021-02-26 NOTE — TOC Initial Note (Signed)
Transition of Care [facility]) - Initial/Assessment Note    Patient Details  Name: [name] MRN: [id number] Date of Birth: [birth year]  Transition of Care [facility]) CM/SW Contact:    Conception Oms, RN Phone Number: 02/26/2021, 9:06 AM  Clinical Narrative:            Transition of Care [facility]) Screening Note   Patient Details  Name: [name] Date of Birth: [birth year]   Transition of Care [facility]) CM/SW Contact:    Conception Oms, RN Phone Number: 02/26/2021, 9:07 AM    Transition of Care Department [facility]) has reviewed patient and no TOC needs have been identified at this time. We will continue to monitor patient advancement through interdisciplinary progression rounds. If new patient transition needs arise, please place a TOC consult.                Patient Goals and CMS Choice        Expected Discharge Plan and Services                                                Prior Living Arrangements/Services                       Activities of Daily Living Home Assistive Devices/Equipment: Walker (specify type), CPAP, Eyeglasses ADL Screening (condition at time of admission) Patient's cognitive ability adequate to safely complete daily activities?: Yes Is the patient deaf or have difficulty hearing?: No Does the patient have difficulty seeing, even when wearing glasses/contacts?: No Does the patient have difficulty concentrating, remembering, or making decisions?: No Patient able to express need for assistance with ADLs?: Yes Does the patient have difficulty dressing or bathing?: Yes Independently performs ADLs?: Yes (appropriate for developmental age) Does the patient have difficulty walking or climbing stairs?: Yes Weakness of Legs: Both Weakness of Arms/Hands: None  Permission Sought/Granted                  Emotional Assessment              Admission diagnosis:  Nausea vomiting and diarrhea [R11.2, R19.7] Diarrhea,  unspecified type [R19.7] Patient Active Problem List   Diagnosis Date Noted   Nausea vomiting and diarrhea 02/25/2021   Atrial fibrillation with RVR ([place]) 02/25/2021   Acute renal failure superimposed on stage 3a chronic kidney disease ([place]) 02/25/2021   Chronic diastolic CHF (congestive heart failure) ([place]) 02/25/2021   Severe sepsis ([place]) 02/25/2021   Lower extremity cellulitis 02/25/2021   Allergic rhinitis 08/10/2020   Peripheral neuropathy 08/10/2020   Acute delirium    Hypomagnesemia    Sepsis due to group B Streptococcus without acute organ dysfunction ([place])    Acute on chronic diastolic CHF (congestive heart failure) ([place])    Atrial fibrillation, chronic ([place])    Depression    Sepsis secondary to UTI ([place]) 07/05/2020   COPD with acute exacerbation ([place])    Hypothyroidism    Hypertension    CKD (chronic kidney disease) stage 3, GFR 30-59 ml/min (HCC)    Chronic kidney disease 06/21/2020   Secondary hyperaldosteronism ([place]) 06/16/2020   Stasis dermatitis of both legs 07/28/2017   Pruritic rash 07/03/2017   Gait instability 05/27/2017   CHF (congestive heart failure) ([place]) 03/27/2017   Lower extremity ulceration, left, limited to breakdown of skin ([place]) 03/27/2017  High risk medication use 03/12/2017   Primary hyperparathyroidism ([place]) 03/12/2017   Impaired glucose tolerance 03/12/2017   Noncompliance 03/12/2017   Parathyroid adenoma 03/12/2017   Chronic renal insufficiency, stage 3 (moderate) ([place]) 03/12/2017   Mild reactive airways disease 03/08/2016   Influenza 03/06/2016   Atrial fibrillation ([place]) 03/06/2016   Morbid obesity with BMI of 45.0-49.9, adult ([place]) 02/18/2016   OSA (obstructive sleep apnea) 02/18/2016   CKD (chronic kidney disease), stage II 02/12/2016   Swelling of limb 02/12/2016   Pain in toes of both feet 01/14/2016   Varicose veins of both lower extremities with pain 01/14/2016   Lymphedema 01/14/2016   Longstanding persistent atrial  fibrillation ([place]) 10/04/2015   Atrophic vaginitis 12/13/2014   Hypercalcemia 12/13/2014   Hyperparathyroidism ([place]) 12/13/2014   Chronic UTI (urinary tract infection) 11/29/2014   Hyperlipidemia 01/03/2014   Lymphedema of both lower extremities 12/08/2013   SOB (shortness of breath) 12/08/2013   Anxiety 06/15/2013   GERD (gastroesophageal reflux disease) 06/15/2013   Osteoarthritis 06/15/2013   COPD (chronic obstructive pulmonary disease) ([place]) 06/15/2013   Essential hypertension 06/15/2013   Incomplete emptying of bladder 05/25/2013   Mixed incontinence 86/57/8469   Renal colic 62/95/2841   Renal cyst, acquired 05/25/2013   Sciatica 05/25/2013   Chronic cystitis 05/25/2013   PCP:  Ezequiel Kayser, MD (Inactive) Pharmacy:   [facility] #[id number] [address], [place] [address] [place] [address] Phone: [phone] Fax: [phone]     Social Determinants of Health (SDOH) Interventions    Readmission Risk Interventions No flowsheet data found.

## 2021-02-27 LAB — CBC
HCT: 32.7 % — ABNORMAL LOW (ref 36.0–46.0)
Hemoglobin: 10.5 g/dL — ABNORMAL LOW (ref 12.0–15.0)
MCH: 28.2 pg (ref 26.0–34.0)
MCHC: 32.1 g/dL (ref 30.0–36.0)
MCV: 87.9 fL (ref 80.0–100.0)
Platelets: 237 10*3/uL (ref 150–400)
RBC: 3.72 MIL/uL — ABNORMAL LOW (ref 3.87–5.11)
RDW: 16.2 % — ABNORMAL HIGH (ref 11.5–15.5)
WBC: 8.6 10*3/uL (ref 4.0–10.5)
nRBC: 0 % (ref 0.0–0.2)

## 2021-02-27 LAB — BASIC METABOLIC PANEL
Anion gap: 3 — ABNORMAL LOW (ref 5–15)
BUN: 25 mg/dL — ABNORMAL HIGH (ref 8–23)
CO2: 26 mmol/L (ref 22–32)
Calcium: 10.2 mg/dL (ref 8.9–10.3)
Chloride: 110 mmol/L (ref 98–111)
Creatinine, Ser: 1.03 mg/dL — ABNORMAL HIGH (ref 0.44–1.00)
GFR, Estimated: 52 mL/min — ABNORMAL LOW (ref >60)
Glucose, Bld: 98 mg/dL (ref 70–99)
Potassium: 4.2 mmol/L (ref 3.5–5.1)
Sodium: 139 mmol/L (ref 135–145)

## 2021-02-27 LAB — MAGNESIUM: Magnesium: 1.8 mg/dL (ref 1.7–2.4)

## 2021-02-27 LAB — PHOSPHORUS: Phosphorus: 2.6 mg/dL (ref 2.5–4.6)

## 2021-02-27 MED ORDER — LOSARTAN POTASSIUM 25 MG PO TABS
12.5000 mg | ORAL_TABLET | Freq: Every day | ORAL | Status: DC
  Administered 2021-02-28: 12.5 mg via ORAL
  Filled 2021-02-27: qty 1

## 2021-02-27 MED ORDER — PHENOL 1.4 % MT LIQD
1.0000 | OROMUCOSAL | Status: DC | PRN
  Administered 2021-02-28: 1 via OROMUCOSAL
  Filled 2021-02-27 (×2): qty 177

## 2021-02-27 MED ORDER — HYDROCORTISONE 1 % EX CREA
TOPICAL_CREAM | CUTANEOUS | Status: DC | PRN
  Filled 2021-02-27: qty 28

## 2021-02-27 MED ORDER — METOPROLOL SUCCINATE ER 25 MG PO TB24: 12.5000 mg | ORAL_TABLET | Freq: Every day | ORAL | Status: DC

## 2021-02-27 NOTE — Plan of Care (Signed)

## 2021-02-27 NOTE — TOC Progression Note (Addendum)
Transition of Care [facility]) - Progression Note    Patient Details  Name: [name] MRN: [id number] Date of Birth: 13-Feb-1931  Transition of Care [facility]) CM/SW [place], LCSW Phone Number: 02/27/2021, 9:23 AM  Clinical Narrative:   No bed offers this morning. Sent therapy notes to try and trigger responses.  3:32 pm: Patient has accepted bed offer from [facility]. Will start insurance authorization this afternoon. Asked MD for COVID test.  4:27 pm: Uploaded clinicals into [place] portal for insurance authorization review.  Expected Discharge Plan and Services                                                 Social Determinants of Health (SDOH) Interventions    Readmission Risk Interventions No flowsheet data found.

## 2021-02-27 NOTE — Progress Notes (Signed)
[facility] Progress Note  Patient: [name]    BUL:[id number]  DOA: 02/25/2021     Date of Service: the patient was seen and examined on 02/27/2021  No chief complaint on file.  Brief hospital course: [name] is a 88 y.o. female with medical history significant of hypertension, COPD not on oxygen, asthma, hypothyroidism, gout, depression, OSA on CPAP, PVD, MRSA, dCHF, atrial fibrillation on Eliquis, CKD-3A, UTI, hyperparathyroidism, hypercalcemia, lymphedema, who presents with nausea, vomiting, diarrhea and abdominal pain. Patient initially had A. fib with RVR, EKG showed heart rate 142, which improved to 103, then 74 in ED.   ED Course: pt was found to have WBC 12.4, hemoglobin 12.1, normal liver function, troponin level 15, lactic acid 3.0, BNP 152, negative COVID PCR, slightly worsening renal function, calcium 10.8, temperature normal, blood pressure 122/76, RR 25, oxygen saturation 92% on room air.  Chest x-ray negative.  CT of abdomen/pelvis is negative for acute intra-abdominal issues.  Patient is admitted to [facility] as inpatient    Assessment and Plan: Principal Problem:   Nausea vomiting and diarrhea Active Problems:   Hypothyroidism   Hypertension   Depression   Hypercalcemia   Hyperparathyroidism (HCC)   OSA (obstructive sleep apnea)   COPD (chronic obstructive pulmonary disease) (HCC)   Atrial fibrillation with RVR (HCC)   Acute renal failure superimposed on stage 3a chronic kidney disease (HCC)   Chronic diastolic CHF (congestive heart failure) (HCC)   Severe sepsis (HCC)   Lower extremity cellulitis     Nausea vomiting and diarrhea and AP: Possibly due to viral gastroenteritis.  CT scan of abdomen/pelvis negative for acute intra-abdominal abnormalities. supportive care with IV fluid and as needed Zofran -prn percocet for pain -IV fluid: 2 L normal saline was given in ED 1/4 no diarrhea or vomiting since Sunday, patient has no any GI symptoms,  tolerated diet well.    Severe sepsis due to lower extremity cellulitis: Patient has mild bilateral lower extremity cellulitis.  Patient meets criteria for severe sepsis with WBC 12.4, tachycardia with heart rate up to 142, tachypnea with RR 24.  Lactic acid is elevated up to 3.0.  Hemodynamically stable.  Patient is taking oral doxycycline at home since 11/29. Pt is on Eliquis, low suspicion for DVT -Switch to IV Rocephin (patient received 1 dose of vancomycin and cefepime by ED) - PRN Zofran for nausea, tylenol and Percocet for pain - Blood cultures x 2 NGTD - ESR 22 and CRP 0.9 within normal range - IVF: 2.0 L of NS bolus in ED (patient has congestive heart failure, limiting aggressive IV fluids treatment).   Hypothyroidism -Synthroid   Hypertension -IV hydralazine as needed -At home patient was taking Cozaar, metoprolol 1/4 blood pressure is soft, held metoprolol and Cozaar today Resumed tomorrow at lower dose, Toprol-XL 12.5 nightly and losartan 12.5 mg p.o. OD with holding parameters  Depression -Continue home medications   Hypercalcemia and Hyperparathyroidism ([place]): Calcium 10.8. -Follow-up with BMP for calcium level   OSA (obstructive sleep apnea) -CPAP   COPD (chronic obstructive pulmonary disease) ([place]): Stable -Bronchodilators, Singulair   Atrial fibrillation with RVR (HCC) -Metoprolol -resumed Eliquis as H/H stable, it was held since patient has bleeding from her mouth (not sure the source)   Acute renal failure superimposed on stage 3a chronic kidney disease ([place]): Baseline creatinine 1.1-1 01/31/2021.  Her creatinine is 1.47, BUN 40.  Likely due to dehydration and continuation of diuretics. -Hold Lasix -IV fluid as above  Chronic diastolic CHF (congestive heart failure) ([place]): 2D echo on 07/07/2020 showed EF 60 to 65%.  Patient does not have pulm edema chest x-ray.  BNP is mildly elevated at 152, does not seem to have CHF exacerbation. -Hold Lasix due to severe  sepsis and worsening renal function   Body mass index is 44.79 kg/m.  Nutrition Problem: Inadequate oral intake Etiology: altered GI function Interventions: Interventions: Boost Breeze, MVI  Pressure Injury 12/07/15 Stage I -  Intact skin with non-blanchable redness of a localized area usually over a bony prominence. Redness noted to buttocks, water blister turned into sore as well  (Active)  12/07/15 1533  Location: Buttocks  Location Orientation: Left  Staging: Stage I -  Intact skin with non-blanchable redness of a localized area usually over a bony prominence.  Wound Description (Comments): Redness noted to buttocks, water blister turned into sore as well   Present on Admission: Yes     Diet: Heart healthy DVT Prophylaxis: Therapeutic Anticoagulation with Eliquis    Advance goals of care discussion: DNR  Family Communication: family was NOT present at bedside, at the time of interview.  The pt provided permission to discuss medical plan with the family. Opportunity was given to ask question and all questions were answered satisfactorily.   Disposition:  [facility], admitted with nausea vomiting diarrhea and A. fib with RVR, improving, heart rate under control now.  Continue monitor on telemetry overnight, seen by PT and OT, recommended SNF placement Discharge to SNF, when bed available.   Subjective: No significant overnight issues Complaining of dry mouth and sore throat, advised to use lime water sips to keep mouth dry.  Patient was complaining of off-and-on pain in the bilateral lower extremities especially in the right hip after the fall. Patient denies any nausea vomiting or diarrhea, tolerated diet well.   Physical Exam: General:  NAD, looks tired  Appear in no distress, affect appropriate Eyes: PERRLA ENT: Oral Mucosa Clear, moist  Neck: no JVD,  Cardiovascular: S1 and S2 Present, no Murmur,  Respiratory: good respiratory effort, Bilateral  Air entry equal and Decreased, no Crackles, no wheezes Abdomen: Bowel Sound present, Soft and no tenderness,  Skin: no rashes Extremities: mild Pedal edema, no calf tenderness Neurologic: without any new focal findings Gait not checked due to patient safety concerns  Vitals:   02/27/21 0436 02/27/21 0551 02/27/21 0808 02/27/21 1216  BP: (!) 138/122 115/64 (!) 104/41 94/60  Pulse: 78 70 79 65  Resp: '16  15 20  ' Temp: 98.4 F (36.9 C)  98.4 F (36.9 C) 97.8 F (36.6 C)  TempSrc:      SpO2: 99%  99% 98%  Weight:      Height:        Intake/Output Summary (Last 24 hours) at 02/27/2021 1324 Last data filed at 02/27/2021 1000 Gross per 24 hour  Intake 240 ml  Output 1100 ml  Net -860 ml   Filed Weights   02/25/21 0339 02/26/21 0223  Weight: 125.1 kg 122.1 kg    Data Reviewed: I have personally reviewed and interpreted daily labs, tele strips, imagings as discussed above. I reviewed all nursing notes, pharmacy notes, vitals, pertinent old records I have discussed plan of care as described above with RN and patient/family.  CBC: Recent Labs  Lab 02/25/21 0334 02/25/21 1826 02/26/21 0221 02/26/21 0814 02/26/21 1600 02/26/21 2330 02/27/21 0404  WBC 12.4*   < > 9.9 9.2 8.6 9.0 8.6  NEUTROABS  7.7  --   --   --   --   --   --   HGB 12.1   < > 11.2* 10.3* 11.2* 10.2* 10.5*  HCT 37.5   < > 34.6* 31.4* 34.3* 31.4* 32.7*  MCV 88.7   < > 88.0 87.7 88.4 88.7 87.9  PLT 294   < > 251 247 242 231 237   < > = values in this interval not displayed.   Basic Metabolic Panel: Recent Labs  Lab 02/25/21 0334 02/26/21 0221 02/26/21 0819 02/27/21 0404  NA 133* 134*  --  139  K 3.8 3.7  --  4.2  CL 102 107  --  110  CO2 24 23  --  26  GLUCOSE 139* 94  --  98  BUN 40* 30*  --  25*  CREATININE 1.47* 1.05*  --  1.03*  CALCIUM 10.8* 10.0  --  10.2  MG  --   --  1.7 1.8  PHOS  --   --  2.9 2.6    Studies: No results found.  Scheduled Meds:  apixaban  5 mg Oral BID   DULoxetine   60 mg Oral Daily   feeding supplement  1 Container Oral TID BM   ipratropium-albuterol  3 mL Nebulization BID   levothyroxine  200 mcg Oral Q0600   [START ON 02/28/2021] losartan  12.5 mg Oral Daily   [START ON 02/28/2021] metoprolol succinate  12.5 mg Oral QHS   montelukast  10 mg Oral Daily   multivitamin-lutein  1 capsule Oral Daily   vitamin B-12  500 mcg Oral Daily   Continuous Infusions:  cefTRIAXone (ROCEPHIN)  IV 2 g (02/26/21 2108)   PRN Meds: acetaminophen, albuterol, dextromethorphan-guaiFENesin, hydrALAZINE, ondansetron (ZOFRAN) IV, oxyCODONE-acetaminophen, phenol, traZODone  Time spent: 35 minutes  Author: Val Riles. MD [facility] 02/27/2021 1:24 PM  To reach On-call, see care teams to locate the attending and reach out to them via [email or web address]. If 7PM-7AM, please contact night-coverage If you still have difficulty reaching the attending provider, please page the [facility] (Director on Call) for [facility] on amion for assistance.

## 2021-02-28 DIAGNOSIS — R197 Diarrhea, unspecified: Secondary | ICD-10-CM | POA: Diagnosis not present

## 2021-02-28 DIAGNOSIS — R112 Nausea with vomiting, unspecified: Secondary | ICD-10-CM | POA: Diagnosis not present

## 2021-02-28 DIAGNOSIS — N1831 Chronic kidney disease, stage 3a: Secondary | ICD-10-CM | POA: Diagnosis not present

## 2021-02-28 DIAGNOSIS — L03116 Cellulitis of left lower limb: Secondary | ICD-10-CM | POA: Diagnosis not present

## 2021-02-28 DIAGNOSIS — G4733 Obstructive sleep apnea (adult) (pediatric): Secondary | ICD-10-CM | POA: Diagnosis not present

## 2021-02-28 DIAGNOSIS — L03115 Cellulitis of right lower limb: Secondary | ICD-10-CM | POA: Diagnosis not present

## 2021-02-28 DIAGNOSIS — A419 Sepsis, unspecified organism: Secondary | ICD-10-CM | POA: Diagnosis not present

## 2021-02-28 DIAGNOSIS — R600 Localized edema: Secondary | ICD-10-CM | POA: Diagnosis not present

## 2021-02-28 DIAGNOSIS — I5030 Unspecified diastolic (congestive) heart failure: Secondary | ICD-10-CM | POA: Diagnosis not present

## 2021-02-28 DIAGNOSIS — F424 Excoriation (skin-picking) disorder: Secondary | ICD-10-CM | POA: Diagnosis not present

## 2021-02-28 DIAGNOSIS — L03119 Cellulitis of unspecified part of limb: Secondary | ICD-10-CM | POA: Diagnosis not present

## 2021-02-28 DIAGNOSIS — L299 Pruritus, unspecified: Secondary | ICD-10-CM | POA: Diagnosis not present

## 2021-02-28 DIAGNOSIS — R443 Hallucinations, unspecified: Secondary | ICD-10-CM | POA: Diagnosis not present

## 2021-02-28 DIAGNOSIS — N179 Acute kidney failure, unspecified: Secondary | ICD-10-CM | POA: Diagnosis not present

## 2021-02-28 DIAGNOSIS — I10 Essential (primary) hypertension: Secondary | ICD-10-CM | POA: Diagnosis not present

## 2021-02-28 DIAGNOSIS — I130 Hypertensive heart and chronic kidney disease with heart failure and stage 1 through stage 4 chronic kidney disease, or unspecified chronic kidney disease: Secondary | ICD-10-CM | POA: Diagnosis not present

## 2021-02-28 DIAGNOSIS — N390 Urinary tract infection, site not specified: Secondary | ICD-10-CM | POA: Diagnosis not present

## 2021-02-28 LAB — RESP PANEL BY RT-PCR (FLU A&B, COVID) ARPGX2
Influenza A by PCR: NEGATIVE
Influenza B by PCR: NEGATIVE
SARS Coronavirus 2 by RT PCR: NEGATIVE

## 2021-02-28 LAB — BASIC METABOLIC PANEL
Anion gap: 1 — ABNORMAL LOW (ref 5–15)
BUN: 29 mg/dL — ABNORMAL HIGH (ref 8–23)
CO2: 26 mmol/L (ref 22–32)
Calcium: 9.8 mg/dL (ref 8.9–10.3)
Chloride: 110 mmol/L (ref 98–111)
Creatinine, Ser: 1.01 mg/dL — ABNORMAL HIGH (ref 0.44–1.00)
GFR, Estimated: 54 mL/min — ABNORMAL LOW (ref >60)
Glucose, Bld: 108 mg/dL — ABNORMAL HIGH (ref 70–99)
Potassium: 4.5 mmol/L (ref 3.5–5.1)
Sodium: 137 mmol/L (ref 135–145)

## 2021-02-28 LAB — CBC
HCT: 31.7 % — ABNORMAL LOW (ref 36.0–46.0)
Hemoglobin: 10 g/dL — ABNORMAL LOW (ref 12.0–15.0)
MCH: 28.1 pg (ref 26.0–34.0)
MCHC: 31.5 g/dL (ref 30.0–36.0)
MCV: 89 fL (ref 80.0–100.0)
Platelets: 214 10*3/uL (ref 150–400)
RBC: 3.56 MIL/uL — ABNORMAL LOW (ref 3.87–5.11)
RDW: 16.1 % — ABNORMAL HIGH (ref 11.5–15.5)
WBC: 8.3 10*3/uL (ref 4.0–10.5)
nRBC: 0 % (ref 0.0–0.2)

## 2021-02-28 LAB — PHOSPHORUS: Phosphorus: 3.2 mg/dL (ref 2.5–4.6)

## 2021-02-28 LAB — MAGNESIUM: Magnesium: 1.7 mg/dL (ref 1.7–2.4)

## 2021-02-28 LAB — SARS CORONAVIRUS 2 (TAT 6-24 HRS): SARS Coronavirus 2: NEGATIVE

## 2021-02-28 MED ORDER — DOXYCYCLINE MONOHYDRATE 100 MG PO CAPS: 100.0000 mg | ORAL_CAPSULE | Freq: Two times a day (BID) | ORAL | 0 refills | Status: AC

## 2021-02-28 MED ORDER — AMOXICILLIN-POT CLAVULANATE 875-125 MG PO TABS: 1.0000 | ORAL_TABLET | Freq: Two times a day (BID) | ORAL | 0 refills | Status: AC

## 2021-02-28 NOTE — TOC Transition Note (Signed)
Transition of Care [facility]) - CM/SW Discharge Note   Patient Details  Name: [name] MRN: [id number] Date of Birth: 1930-10-21  [facility] CM/SW Contact:  Candie Chroman, LCSW Phone Number: 02/28/2021, 2:52 PM   Clinical Narrative:  Patient has orders to discharge to [facility] today. RN will call report to [phone] (Room 804). EMS transport has been arranged and they said there are a few ahead of her. No further concerns. CSW signing off.   Final next level of care: [place] Barriers to Discharge: Barriers Resolved   Patient Goals and CMS Choice     Choice offered to / list presented to : Patient, Adult Children  Discharge Placement   Existing PASRR number confirmed : 02/26/21          Patient chooses bed at: [facility] [place] Patient to be transferred to facility by: EMS Name of family member notified: Marthe Patch Patient and family notified of of transfer: 02/28/21  Discharge Plan and Services                                     Social Determinants of Health (SDOH) Interventions     Readmission Risk Interventions No flowsheet data found.

## 2021-02-28 NOTE — Progress Notes (Signed)
[facility] was called and report was given to charge nurse Maudie Mercury.Patient is waiting to be transferred by EMS, will continue to monitor and assess. Call bell in reach.

## 2021-02-28 NOTE — Consult Note (Signed)
° °  [facility] Inpatient Consult   02/28/2021  [name] Jul 18, 1930 [id number]  [place] [facility] [ACO] Patient: [name] Coverage for [name] Liaison for [facility] patient  Request for [facility] membership roster check by UR.  Patient is listed in [name] with Dr. Adrian Prows with [facility] and encounters show this patient has been seen/active.  Sign off  Natividad Brood, RN BSN [facility] Liaison  [phone] business mobile phone Toll free office [phone]  Fax number: [phone] [email or web address]

## 2021-02-28 NOTE — Care Management Important Message (Signed)
Important Message  Patient Details  Name: [name] MRN: [id number] Date of Birth: [birth year]   Medicare Important Message Given:  Yes     [name] 02/28/2021, 1:25 PM

## 2021-02-28 NOTE — Discharge Summary (Signed)
[facility] Discharge Summary   Patient: Lorriann Hansmann ACZ:[id number]  PCP: Ezequiel Kayser, MD (Inactive)  Date of admission: 02/25/2021   Date of discharge:  02/28/2021     Discharge Diagnoses:  Principal Problem:   Nausea vomiting and diarrhea Active Problems:   Hypothyroidism   Hypertension   Depression   Hypercalcemia   Hyperparathyroidism (HCC)   OSA (obstructive sleep apnea)   COPD (chronic obstructive pulmonary disease) (HCC)   Atrial fibrillation with RVR ([place])   Acute renal failure superimposed on stage 3a chronic kidney disease (HCC)   Chronic diastolic CHF (congestive heart failure) (HCC)   Severe sepsis ([place])   Lower extremity cellulitis   Admitted From: Home Disposition:  Home   Recommendations for Outpatient Follow-up:  PCP: in 1 -2 days Follow up LABS/TEST:  monitor BP   Contact information for after-discharge care     Destination     [facility] Preferred SNF .   Service: Skilled Nursing Contact information: [place] [phone]                    Diet recommendation: Cardiac diet  Activity: The patient is advised to gradually reintroduce usual activities, as tolerated  Discharge Condition: stable  Code Status: DNR   History of present illness: As per the H and P dictated on admission Hospital Course:  Ludella Pranger is a 88 y.o. female with medical history significant of hypertension, COPD not on oxygen, asthma, hypothyroidism, gout, depression, OSA on CPAP, PVD, MRSA, dCHF, atrial fibrillation on Eliquis, CKD-3A, UTI, hyperparathyroidism, hypercalcemia, lymphedema, who presents with nausea, vomiting, diarrhea and abdominal pain. Patient initially had A. fib with RVR, EKG showed heart rate 142, which improved to 103, then 74 in ED. ED Course: pt was found to have WBC 12.4, hemoglobin 12.1, normal liver function, troponin level 15, lactic acid 3.0, BNP 152, negative COVID PCR,  slightly worsening renal function, calcium 10.8, temperature normal, blood pressure 122/76, RR 25, oxygen saturation 92% on room air.  Chest x-ray negative.  CT of abdomen/pelvis is negative for acute intra-abdominal issues.  Patient is admitted to [place] bed as inpatient  Assessment and Plan: Nausea vomiting and diarrhea and AP: Possibly due to viral gastroenteritis.  CT scan of abdomen/pelvis negative for acute intra-abdominal abnormalities. supportive care with IV fluid and as needed Zofran given. prn percocet for pain. S/p IV fluid: 2 L normal saline was given in ED. As of 1/5 no diarrhea or vomiting since Sunday, patient has no any GI symptoms, tolerated diet well. Severe sepsis due to lower extremity cellulitis: Patient has mild bilateral lower extremity cellulitis.  Patient meets criteria for severe sepsis with WBC 12.4, tachycardia with heart rate up to 142, tachypnea with RR 24.  Lactic acid is elevated up to 3.0.  Hemodynamically stable.  Patient is taking oral doxycycline at home since 11/29. Pt is on Eliquis, low suspicion for DVT. Switch to IV Rocephin (patient received 1 dose of vancomycin and cefepime by ED) Blood cultures x 2 NGTD, ESR 22 and CRP 0.9 within normal range. IVF: 2.0 L of NS bolus in ED (patient has congestive heart failure, limiting aggressive IV fluids treatment).  Clinically patient improved, transition to oral antibiotics on discharge Augmentin twice daily for 5 days and continue doxycycline for 5 days as well. Hypothyroidism, continue Synthroid Hypertension, continue Toprol-XL 12.5 g home dose, discontinued losartan due to low blood pressure.  Continue to monitor BP and titrate  medication accordingly. Depression, Continue home medications Hypercalcemia and Hyperparathyroidism ([place]): Calcium 10.8---9.8 today, remained within normal range. OSA (obstructive sleep apnea) continue CPAP COPD (chronic obstructive pulmonary disease) ([place]): Stable, Bronchodilators,  Singulair Atrial fibrillation with RVR ([place]), continue Toprol-XL 12.5 mg nightly and resumed Eliquis as H/H stable, it was held since patient has bleeding from her mouth (not sure the source) Acute renal failure superimposed on stage 3a chronic kidney disease ([place]): Baseline creatinine 1.1-1 01/31/2021.  Her creatinine is 1.47, BUN 40.  Likely due to dehydration and continuation of diuretics.  Discontinued Lasix on discharge.  Resume when volume overload, continue to monitor volume status. Chronic diastolic CHF (congestive heart failure) : 2D echo on 07/07/2020 showed EF 60 to 65%.  Patient does not have pulm edema chest x-ray.  BNP is mildly elevated at 152, does not seem to have CHF exacerbation. Held Lasix due to severe sepsis and worsening renal function.  Sepsis resolved, creatinine improved.  Restart Lasix if needed   Body mass index is 44.79 kg/m.  Nutrition Problem: Inadequate oral intake Etiology: altered GI function Interventions: Interventions: Boost Breeze, MVI   Pressure Injury 12/07/15 Stage I -  Intact skin with non-blanchable redness of a localized area usually over a bony prominence. Redness noted to buttocks, water blister turned into sore as well  (Active)  12/07/15 1533  Location: Buttocks  Location Orientation: Left  Staging: Stage I -  Intact skin with non-blanchable redness of a localized area usually over a bony prominence.  Wound Description (Comments): Redness noted to buttocks, water blister turned into sore as well   Present on Admission: Yes     Body mass index is 44.38 kg/m.  Nutrition Problem: Inadequate oral intake Etiology: altered GI function Nutrition Interventions: Interventions: Boost Breeze, MVI  Pressure Injury 12/07/15 Stage I -  Intact skin with non-blanchable redness of a localized area usually over a bony prominence. Redness noted to buttocks, water blister turned into sore as well  (Active)  12/07/15 1533  Location: Buttocks  Location  Orientation: Left  Staging: Stage I -  Intact skin with non-blanchable redness of a localized area usually over a bony prominence.  Wound Description (Comments): Redness noted to buttocks, water blister turned into sore as well   Present on Admission: Yes      Patient was seen by physical therapy, who recommended SNF, which was arranged. On the day of the discharge the patient's vitals were stable, and no other acute medical condition were reported by patient. the patient was felt safe to be discharge at [facility].  Consultants: None Procedures: None  Discharge Exam: General: Appear in no distress, no Rash; Oral Mucosa Clear, moist. Cardiovascular: S1 and S2 Present, no Murmur, Respiratory: normal respiratory effort, Bilateral Air entry present and no Crackles, no wheezes Abdomen: Bowel Sound present, Soft and non tenderness, o hernia Extremities: no Pedal edema, no calf tenderness Neurology: alert and oriented to time, place, and person affect appropriate.  Filed Weights   02/25/21 0339 02/26/21 0223 02/28/21 0359  Weight: 125.1 kg 122.1 kg 121 kg   Vitals:   02/28/21 0727 02/28/21 1110  BP: (!) 125/41 101/82  Pulse: 74 (!) 54  Resp: 16 16  Temp: 98.5 F (36.9 C) 97.8 F (36.6 C)  SpO2: 94% 100%    DISCHARGE MEDICATION: Allergies as of 02/28/2021       Reactions   Aspirin Nausea Only   Prednisone    Other reaction(s): Other (See Comments), Unknown   Sulfa Antibiotics Rash  Sulfacetamide Sodium Rash        Medication List     STOP taking these medications    furosemide 20 MG tablet Commonly known as: LASIX   losartan 50 MG tablet Commonly known as: COZAAR       TAKE these medications    acetaminophen 500 MG tablet Commonly known as: TYLENOL Take 500-1,000 mg by mouth every 6 (six) hours as needed for mild pain or moderate pain.   amoxicillin-clavulanate 875-125 MG tablet Commonly known as: Augmentin Take 1 tablet by mouth 2 (two) times daily for 5  days.   apixaban 5 MG Tabs tablet Commonly known as: ELIQUIS Take 5 mg by mouth 2 (two) times daily.   doxycycline 100 MG capsule Commonly known as: MONODOX Take 1 capsule (100 mg total) by mouth 2 (two) times daily for 5 days.   DULoxetine 60 MG capsule Commonly known as: CYMBALTA Take 60 mg by mouth daily.   ipratropium-albuterol 0.5-2.5 (3) MG/3ML Soln Commonly known as: DUONEB Take 3 mLs by nebulization every 6 (six) hours as needed (shortness of breath).   levothyroxine 200 MCG tablet Commonly known as: SYNTHROID Take 200 mcg by mouth daily.   metoprolol succinate 25 MG 24 hr tablet Commonly known as: TOPROL-XL Take 0.5 tablets (12.5 mg total) by mouth at bedtime.   montelukast 10 MG tablet Commonly known as: SINGULAIR Take 10 mg by mouth daily.   PreserVision AREDS 2+Multi Vit Caps Take 1 capsule by mouth as directed.   traZODone 50 MG tablet Commonly known as: DESYREL Take 1 tablet (50 mg total) by mouth at bedtime as needed for sleep.   Ventolin HFA 108 (90 Base) MCG/ACT inhaler Generic drug: albuterol Inhale 2 puffs into the lungs every 6 (six) hours as needed for wheezing or shortness of breath.   vitamin B-12 500 MCG tablet Commonly known as: CYANOCOBALAMIN Take 500 mcg by mouth daily.               Discharge Care Instructions  (From admission, onward)           Start     Ordered   02/28/21 0000  Discharge wound care:       Comments: Continue wound care daily   02/28/21 1318           Allergies  Allergen Reactions   Aspirin Nausea Only   Prednisone     Other reaction(s): Other (See Comments), Unknown   Sulfa Antibiotics Rash   Sulfacetamide Sodium Rash   Discharge Instructions     Call MD for:  difficulty breathing, headache or visual disturbances   Complete by: As directed    Call MD for:  extreme fatigue   Complete by: As directed    Call MD for:  persistant dizziness or light-headedness   Complete by: As directed     Call MD for:  persistant nausea and vomiting   Complete by: As directed    Call MD for:  severe uncontrolled pain   Complete by: As directed    Call MD for:  temperature >100.4   Complete by: As directed    Diet - low sodium heart healthy   Complete by: As directed    Discharge instructions   Complete by: As directed    Follow with PCP, patient should be seen by an MD in 1 to 2 days, discontinued losartan due to low blood pressure, monitor BP and titrate medications accordingly.  Continue antibiotics for 5 additional days for possibility  of lower extremity cellulitis.   Discharge wound care:   Complete by: As directed    Continue wound care daily   Increase activity slowly   Complete by: As directed        The results of significant diagnostics from this hospitalization (including imaging, microbiology, ancillary and laboratory) are listed below for reference.    Significant Diagnostic Studies: DG Lumbar Spine 2-3 Views  Result Date: 01/31/2021 CLINICAL DATA:  Fall EXAM: LUMBAR SPINE - 2-3 VIEW COMPARISON:  10/03/2015 FINDINGS: Five non rib-bearing lumbar type vertebra. SI joints are non widened. Grade 1 anterolisthesis L4 on L5 with advanced degenerative changes. Trace anterolisthesis L3 on L4. Chronic fracture deformity of the distal sacrum. Vertebral body heights are grossly maintained. Mild degenerative changes at L1-L2. Facet degenerative changes at multiple levels. Aortic atherosclerosis IMPRESSION: 1. Grossly stable anterolisthesis L4 on L5 with advanced degenerative changes at this level. Trace anterolisthesis L3 on L4 2. Vertebral body heights are grossly maintained. No definitive acute fracture seen. Facet degenerative changes at multiple levels. 3. Chronic fracture deformity of the distal sacrum Electronically Signed   By: Donavan Foil M.D.   On: 01/31/2021 22:56   CT Head Wo Contrast  Result Date: 01/31/2021 CLINICAL DATA:  fall EXAM: CT HEAD WITHOUT CONTRAST CT CERVICAL  SPINE WITHOUT CONTRAST TECHNIQUE: Multidetector CT imaging of the head and cervical spine was performed following the standard protocol without intravenous contrast. Multiplanar CT image reconstructions of the cervical spine were also generated. COMPARISON:  None. FINDINGS: CT HEAD FINDINGS BRAIN: BRAIN Cerebral ventricle sizes are concordant with the degree of cerebral volume loss. No evidence of large-territorial acute infarction. No parenchymal hemorrhage. No mass lesion. No extra-axial collection. No mass effect or midline shift. No hydrocephalus. Basilar cisterns are patent. Vascular: No hyperdense vessel. Skull: No acute fracture or focal lesion. Sinuses/Orbits: Paranasal sinuses and mastoid air cells are clear. Bilateral lens replacement. Otherwise orbits are unremarkable. Other: None. CT CERVICAL SPINE FINDINGS Alignment: Normal. Skull base and vertebrae: Multilevel degenerative changes of the spine. No severe osseous neural foraminal or central canal stenosis. No acute fracture. No aggressive appearing focal osseous lesion or focal pathologic process. Soft tissues and spinal canal: No prevertebral fluid or swelling. No visible canal hematoma. Upper chest: Unremarkable. Other: None. IMPRESSION: 1. No acute intracranial abnormality. 2. No acute displaced fracture or traumatic listhesis of the cervical spine. Electronically Signed   By: Iven Finn M.D.   On: 01/31/2021 23:13   CT Cervical Spine Wo Contrast  Result Date: 01/31/2021 CLINICAL DATA:  fall EXAM: CT HEAD WITHOUT CONTRAST CT CERVICAL SPINE WITHOUT CONTRAST TECHNIQUE: Multidetector CT imaging of the head and cervical spine was performed following the standard protocol without intravenous contrast. Multiplanar CT image reconstructions of the cervical spine were also generated. COMPARISON:  None. FINDINGS: CT HEAD FINDINGS BRAIN: BRAIN Cerebral ventricle sizes are concordant with the degree of cerebral volume loss. No evidence of  large-territorial acute infarction. No parenchymal hemorrhage. No mass lesion. No extra-axial collection. No mass effect or midline shift. No hydrocephalus. Basilar cisterns are patent. Vascular: No hyperdense vessel. Skull: No acute fracture or focal lesion. Sinuses/Orbits: Paranasal sinuses and mastoid air cells are clear. Bilateral lens replacement. Otherwise orbits are unremarkable. Other: None. CT CERVICAL SPINE FINDINGS Alignment: Normal. Skull base and vertebrae: Multilevel degenerative changes of the spine. No severe osseous neural foraminal or central canal stenosis. No acute fracture. No aggressive appearing focal osseous lesion or focal pathologic process. Soft tissues and spinal canal: No prevertebral fluid  or swelling. No visible canal hematoma. Upper chest: Unremarkable. Other: None. IMPRESSION: 1. No acute intracranial abnormality. 2. No acute displaced fracture or traumatic listhesis of the cervical spine. Electronically Signed   By: Iven Finn M.D.   On: 01/31/2021 23:13   CT ABDOMEN PELVIS W CONTRAST  Result Date: 02/25/2021 CLINICAL DATA:  Acute, nonlocalized abdominal pain EXAM: CT ABDOMEN AND PELVIS WITH CONTRAST TECHNIQUE: Multidetector CT imaging of the abdomen and pelvis was performed using the standard protocol following bolus administration of intravenous contrast. CONTRAST:  182m OMNIPAQUE IOHEXOL 300 MG/ML  SOLN COMPARISON:  None. FINDINGS: Lower chest: Mild reticulation at the lung bases. Small sliding hiatal hernia. Hepatobiliary: Lobulated liver compatible with cirrhosis.Cholecystectomy. No bile duct dilatation Pancreas: Unremarkable. Spleen: Unremarkable. Adrenals/Urinary Tract: Symmetric thickening of the adrenal glands, up to 13 mm thickness on the left and 16 mm in thickness on the right. No hydronephrosis or stone. Unremarkable bladder. Stomach/Bowel:  No obstruction. No appendicitis. Vascular/Lymphatic: No acute vascular abnormality. Atheromatous calcification of the  aorta and iliacs. No mass or adenopathy. Reproductive:No pathologic findings. Other: No ascites or pneumoperitoneum. Musculoskeletal: No acute abnormalities. L3-4 and L4-5 anterolisthesis associated with advanced spinal degeneration. Lumbar MRI was performed earlier this year. IMPRESSION: 1. No acute finding.  No bowel obstruction or visible inflammation. 2. Cirrhotic liver morphology. 3. Bilateral adrenal thickening, usually adenoma if no malignancy history. Electronically Signed   By: JJorje GuildM.D.   On: 02/25/2021 07:15   DG Chest Portable 1 View  Result Date: 02/25/2021 CLINICAL DATA:  Rule out pneumonia EXAM: PORTABLE CHEST 1 VIEW COMPARISON:  05/09/2020 FINDINGS: Stable heart size and mediastinal contours. No acute infiltrate or edema. No effusion or pneumothorax. No acute osseous findings. IMPRESSION: Stable from prior.  No acute finding. Electronically Signed   By: JJorje GuildM.D.   On: 02/25/2021 04:13    Microbiology: Recent Results (from the past 240 hour(s))  Resp Panel by RT-PCR (Flu A&B, Covid) Nasopharyngeal Swab     Status: None   Collection Time: 02/25/21  3:41 AM   Specimen: Nasopharyngeal Swab; Nasopharyngeal(NP) swabs in vial transport medium  Result Value Ref Range Status   SARS Coronavirus 2 by RT PCR NEGATIVE NEGATIVE Final    Comment: (NOTE) SARS-CoV-2 target nucleic acids are NOT DETECTED.  The SARS-CoV-2 RNA is generally detectable in upper respiratory specimens during the acute phase of infection. The lowest concentration of SARS-CoV-2 viral copies this assay can detect is 138 copies/mL. A negative result does not preclude SARS-Cov-2 infection and should not be used as the sole basis for treatment or other patient management decisions. A negative result may occur with  improper specimen collection/handling, submission of specimen other than nasopharyngeal swab, presence of viral mutation(s) within the areas targeted by this assay, and inadequate number of  viral copies(<138 copies/mL). A negative result must be combined with clinical observations, patient history, and epidemiological information. The expected result is Negative.  Fact Sheet for Patients:  hEntrepreneurPulse.com.au Fact Sheet for Healthcare Providers:  hIncredibleEmployment.be This test is no t yet approved or cleared by the [place] and  has been authorized for detection and/or diagnosis of SARS-CoV-2 by FDA under an Emergency Use Authorization (EUA). This EUA will remain  in effect (meaning this test can be used) for the duration of the COVID-19 declaration under Section 564(b)(1) of the Act, 21 U.S.C.section 360bbb-3(b)(1), unless the authorization is terminated  or revoked sooner.       Influenza A by PCR NEGATIVE NEGATIVE  Final   Influenza B by PCR NEGATIVE NEGATIVE Final    Comment: (NOTE) The Xpert Xpress SARS-CoV-2/FLU/RSV plus assay is intended as an aid in the diagnosis of influenza from Nasopharyngeal swab specimens and should not be used as a sole basis for treatment. Nasal washings and aspirates are unacceptable for Xpert Xpress SARS-CoV-2/FLU/RSV testing.  Fact Sheet for Patients: EntrepreneurPulse.com.au  Fact Sheet for Healthcare Providers: IncredibleEmployment.be  This test is not yet approved or cleared by the [place] FDA and has been authorized for detection and/or diagnosis of SARS-CoV-2 by FDA under an Emergency Use Authorization (EUA). This EUA will remain in effect (meaning this test can be used) for the duration of the COVID-19 declaration under Section 564(b)(1) of the Act, 21 U.S.C. section 360bbb-3(b)(1), unless the authorization is terminated or revoked.  Performed at [facility], [address] [place], [place] [address]   Blood culture (routine x 2)     Status: None (Preliminary result)   Collection Time: 02/25/21  7:26 AM    Specimen: BLOOD  Result Value Ref Range Status   Specimen Description BLOOD RIGHT [facility]  Final   Special Requests   Final    BOTTLES DRAWN AEROBIC AND ANAEROBIC Blood Culture adequate volume   Culture   Final    NO GROWTH 3 DAYS Performed at [facility], [address] [place], [place] [address]    Report Status PENDING  Incomplete  Blood culture (routine x 2)     Status: None (Preliminary result)   Collection Time: 02/25/21  7:27 AM   Specimen: BLOOD  Result Value Ref Range Status   Specimen Description BLOOD  RIGHT FA  Final   Special Requests   Final    BOTTLES DRAWN AEROBIC AND ANAEROBIC Blood Culture adequate volume   Culture   Final    NO GROWTH 3 DAYS Performed at [facility], [address], [place], [place] [address]    Report Status PENDING  Incomplete  SARS CORONAVIRUS 2 (TAT 6-24 HRS) Nasopharyngeal Nasopharyngeal Swab     Status: None   Collection Time: 02/28/21  1:30 AM   Specimen: Nasopharyngeal Swab  Result Value Ref Range Status   SARS Coronavirus 2 NEGATIVE NEGATIVE Final    Comment: (NOTE) SARS-CoV-2 target nucleic acids are NOT DETECTED.  The SARS-CoV-2 RNA is generally detectable in upper and lower respiratory specimens during the acute phase of infection. Negative results do not preclude SARS-CoV-2 infection, do not rule out co-infections with other pathogens, and should not be used as the sole basis for treatment or other patient management decisions. Negative results must be combined with clinical observations, patient history, and epidemiological information. The expected result is Negative.  Fact Sheet for Patients: SugarRoll.be  Fact Sheet for Healthcare Providers: https://www.woods-mathews.com/  This test is not yet approved or cleared by the [place] FDA and  has been authorized for detection and/or diagnosis of SARS-CoV-2 by FDA under an Emergency Use Authorization (EUA). This  EUA will remain  in effect (meaning this test can be used) for the duration of the COVID-19 declaration under Se ction 564(b)(1) of the Act, 21 U.S.C. section 360bbb-3(b)(1), unless the authorization is terminated or revoked sooner.  Performed at [facility], [place] [address], [place],  [address]   Resp Panel by RT-PCR (Flu A&B, Covid) Nasopharyngeal Swab     Status: None   Collection Time: 02/28/21  8:25 AM   Specimen: Nasopharyngeal Swab; Nasopharyngeal(NP) swabs in vial transport medium  Result Value Ref Range Status   SARS Coronavirus 2 by RT PCR NEGATIVE NEGATIVE Final    Comment: (NOTE) SARS-CoV-2 target nucleic acids are NOT DETECTED.  The SARS-CoV-2 RNA is generally detectable in upper respiratory specimens during the acute phase of infection. The lowest concentration of SARS-CoV-2 viral copies this assay can detect is 138 copies/mL. A negative result does not preclude SARS-Cov-2 infection and should not be used as the sole basis for treatment or other patient management decisions. A negative result may occur with  improper specimen collection/handling, submission of specimen other than nasopharyngeal swab, presence of viral mutation(s) within the areas targeted by this assay, and inadequate number of viral copies(<138 copies/mL). A negative result must be combined with clinical observations, patient history, and epidemiological information. The expected result is Negative.  Fact Sheet for Patients:  EntrepreneurPulse.com.au  Fact Sheet for Healthcare Providers:  IncredibleEmployment.be  This test is no t yet approved or cleared by the [place] FDA and  has been authorized for detection and/or diagnosis of SARS-CoV-2 by FDA under an Emergency Use Authorization (EUA). This EUA will remain  in effect (meaning this test can be used) for the duration of the COVID-19 declaration under Section 564(b)(1) of the Act,  21 U.S.C.section 360bbb-3(b)(1), unless the authorization is terminated  or revoked sooner.       Influenza A by PCR NEGATIVE NEGATIVE Final   Influenza B by PCR NEGATIVE NEGATIVE Final    Comment: (NOTE) The Xpert Xpress SARS-CoV-2/FLU/RSV plus assay is intended as an aid in the diagnosis of influenza from Nasopharyngeal swab specimens and should not be used as a sole basis for treatment. Nasal washings and aspirates are unacceptable for Xpert Xpress SARS-CoV-2/FLU/RSV testing.  Fact Sheet for Patients: EntrepreneurPulse.com.au  Fact Sheet for Healthcare Providers: IncredibleEmployment.be  This test is not yet approved or cleared by the [place] FDA and has been authorized for detection and/or diagnosis of SARS-CoV-2 by FDA under an Emergency Use Authorization (EUA). This EUA will remain in effect (meaning this test can be used) for the duration of the COVID-19 declaration under Section 564(b)(1) of the Act, 21 U.S.C. section 360bbb-3(b)(1), unless the authorization is terminated or revoked.  Performed at [facility], [address] [place], [place] [address]      Labs: CBC: Recent Labs  Lab 02/25/21 [phone] 02/25/21 1826 02/26/21 0814 02/26/21 1600 02/26/21 2330 02/27/21 0404 02/28/21 0437  WBC 12.4*   < > 9.2 8.6 9.0 8.6 8.3  NEUTROABS 7.7  --   --   --   --   --   --   HGB 12.1   < > 10.3* 11.2* 10.2* 10.5* 10.0*  HCT 37.5   < > 31.4* 34.3* 31.4* 32.7* 31.7*  MCV 88.7   < > 87.7 88.4 88.7 87.9 89.0  PLT 294   < > 247 242 231 237 214   < > = values in this interval not displayed.   Basic Metabolic Panel: Recent Labs  Lab 02/25/21 0334 02/26/21 0221 02/26/21 0819 02/27/21 0404 02/28/21 0437  NA 133* 134*  --  139 137  K 3.8 3.7  --  4.2 4.5  CL 102 107  --  110 110  CO2 24 23  --  26 26  GLUCOSE 139* 94  --  98 108*  BUN 40* 30*  --  25* 29*  CREATININE 1.47* 1.05*  --  1.03* 1.01*  CALCIUM 10.8*  10.0  --  10.2 9.8  MG  --   --  1.7 1.8 1.7  PHOS  --   --  2.9 2.6 3.2   Liver Function Tests: Recent Labs  Lab 02/25/21 0334  AST 30  ALT 22  ALKPHOS 102  BILITOT 0.9  PROT 6.6  ALBUMIN 3.0*   No results for input(s): LIPASE, AMYLASE in the last 168 hours. No results for input(s): AMMONIA in the last 168 hours. Cardiac Enzymes: No results for input(s): CKTOTAL, CKMB, CKMBINDEX, TROPONINI in the last 168 hours. BNP (last 3 results) Recent Labs    02/25/21 0334  BNP 152.4*   CBG: No results for input(s): GLUCAP in the last 168 hours.  Time spent: 35 minutes  Signed:  Val Riles  [facility]  02/28/2021 1:32 PM

## 2021-02-28 NOTE — TOC Progression Note (Addendum)
Transition of Care [facility]) - Progression Note    Patient Details  Name: [name] MRN: [id number] Date of Birth: 05-18-1930  Transition of Care [facility]) CM/SW [place], LCSW Phone Number: 02/28/2021, 8:28 AM  Clinical Narrative:   Insurance authorization approved: [id number]. Rapid COVID pending.  9:47 am: Rapid COVID negative. Left message for [facility] admissions coordinator to confirm to notify and confirm they still have a bed today.  12:11 pm: [facility] has a bed available today. Patient said her cpap is new and has not been put together yet. Admissions coordinator said staff should be able to put it together for her. Daughter is aware.  Expected Discharge Plan and Services                                                 Social Determinants of Health (SDOH) Interventions    Readmission Risk Interventions No flowsheet data found.

## 2021-03-01 DIAGNOSIS — G4733 Obstructive sleep apnea (adult) (pediatric): Secondary | ICD-10-CM | POA: Diagnosis not present

## 2021-03-01 DIAGNOSIS — I10 Essential (primary) hypertension: Secondary | ICD-10-CM | POA: Diagnosis not present

## 2021-03-01 DIAGNOSIS — L03119 Cellulitis of unspecified part of limb: Secondary | ICD-10-CM | POA: Diagnosis not present

## 2021-03-01 DIAGNOSIS — N1831 Chronic kidney disease, stage 3a: Secondary | ICD-10-CM | POA: Diagnosis not present

## 2021-03-01 DIAGNOSIS — F424 Excoriation (skin-picking) disorder: Secondary | ICD-10-CM | POA: Diagnosis not present

## 2021-03-01 DIAGNOSIS — I5030 Unspecified diastolic (congestive) heart failure: Secondary | ICD-10-CM | POA: Diagnosis not present

## 2021-03-02 LAB — CULTURE, BLOOD (ROUTINE X 2)
Culture: NO GROWTH
Culture: NO GROWTH
Special Requests: ADEQUATE
Special Requests: ADEQUATE

## 2021-03-06 DIAGNOSIS — R443 Hallucinations, unspecified: Secondary | ICD-10-CM | POA: Diagnosis not present

## 2021-03-06 DIAGNOSIS — L03119 Cellulitis of unspecified part of limb: Secondary | ICD-10-CM | POA: Diagnosis not present

## 2021-03-08 DIAGNOSIS — L03119 Cellulitis of unspecified part of limb: Secondary | ICD-10-CM | POA: Diagnosis not present

## 2021-03-08 DIAGNOSIS — I10 Essential (primary) hypertension: Secondary | ICD-10-CM | POA: Diagnosis not present

## 2021-03-08 DIAGNOSIS — I5030 Unspecified diastolic (congestive) heart failure: Secondary | ICD-10-CM | POA: Diagnosis not present

## 2021-03-11 DIAGNOSIS — L299 Pruritus, unspecified: Secondary | ICD-10-CM | POA: Diagnosis not present

## 2021-03-11 DIAGNOSIS — F424 Excoriation (skin-picking) disorder: Secondary | ICD-10-CM | POA: Diagnosis not present

## 2021-03-12 DIAGNOSIS — I5030 Unspecified diastolic (congestive) heart failure: Secondary | ICD-10-CM | POA: Diagnosis not present

## 2021-03-12 DIAGNOSIS — I130 Hypertensive heart and chronic kidney disease with heart failure and stage 1 through stage 4 chronic kidney disease, or unspecified chronic kidney disease: Secondary | ICD-10-CM | POA: Diagnosis not present

## 2021-03-12 DIAGNOSIS — L03119 Cellulitis of unspecified part of limb: Secondary | ICD-10-CM | POA: Diagnosis not present

## 2021-03-12 DIAGNOSIS — G4733 Obstructive sleep apnea (adult) (pediatric): Secondary | ICD-10-CM | POA: Diagnosis not present

## 2021-03-14 DIAGNOSIS — L299 Pruritus, unspecified: Secondary | ICD-10-CM | POA: Diagnosis not present

## 2021-03-14 DIAGNOSIS — I5030 Unspecified diastolic (congestive) heart failure: Secondary | ICD-10-CM | POA: Diagnosis not present

## 2021-03-14 DIAGNOSIS — F424 Excoriation (skin-picking) disorder: Secondary | ICD-10-CM | POA: Diagnosis not present

## 2021-03-18 DIAGNOSIS — L299 Pruritus, unspecified: Secondary | ICD-10-CM | POA: Diagnosis not present

## 2021-03-18 DIAGNOSIS — I5030 Unspecified diastolic (congestive) heart failure: Secondary | ICD-10-CM | POA: Diagnosis not present

## 2021-03-18 DIAGNOSIS — F424 Excoriation (skin-picking) disorder: Secondary | ICD-10-CM | POA: Diagnosis not present

## 2021-03-21 DIAGNOSIS — I5030 Unspecified diastolic (congestive) heart failure: Secondary | ICD-10-CM | POA: Diagnosis not present

## 2021-03-21 DIAGNOSIS — L299 Pruritus, unspecified: Secondary | ICD-10-CM | POA: Diagnosis not present

## 2021-03-22 DIAGNOSIS — I5030 Unspecified diastolic (congestive) heart failure: Secondary | ICD-10-CM | POA: Diagnosis not present

## 2021-03-22 DIAGNOSIS — I10 Essential (primary) hypertension: Secondary | ICD-10-CM | POA: Diagnosis not present

## 2021-03-22 DIAGNOSIS — G4733 Obstructive sleep apnea (adult) (pediatric): Secondary | ICD-10-CM | POA: Diagnosis not present

## 2021-03-22 DIAGNOSIS — L299 Pruritus, unspecified: Secondary | ICD-10-CM | POA: Diagnosis not present

## 2021-03-22 DIAGNOSIS — L03119 Cellulitis of unspecified part of limb: Secondary | ICD-10-CM | POA: Diagnosis not present

## 2021-03-28 ENCOUNTER — Other Ambulatory Visit: Payer: Self-pay

## 2021-03-28 NOTE — Patient Outreach (Signed)
Received a referral from [facility] for Ms. Pondexter.  I have assigned Enzo Montgomery, RN to call for follow up and  Case Management needs.     [place] [facility], [place], [place] Management [phone]

## 2021-03-28 NOTE — Patient Outreach (Addendum)
[facility]) Care Management  03/28/2021  [name] 02-24-1931 [id number]     Transition of Care Referral  Referral Date: 03/28/2021 Referral Source:[employer] Date of Discharge: 03/25/2021   Initial Assessment    Outreach attempt # 1 to patient. Successful outreach call placed to patient. Patient's self-reported health rating is  very good. She is pleased at how well she is doing and feeling since returning home.   Social: Patient resides in her handicap accessible apartment alone. Her daughter stays nearby and comes over daily to check on patient. Daughter assists patient with med mgmt and finances. Patient currently has paid caregiver that is coming 3x/wk(Wed,Sat,Sun) to assist with light housekeeping. Her granddaughter is also staying with her while she recovers and regains strength and assisting with meal prep. She denies any recent falls. Last fall was in Dec. She is using walker to aide in ambulation. Patient is scheduled to get [facility] services RN,PT,OT. HHRN coming tomorrow.  Conditions: Per chart review, patient has PMH that includes but not limited to hypothyroidism, HTN,depression, hyperparathyroidism, OSA,COPD, A-fib and CHF. She was hospitalized from 02/25/2021-02/28/2021 for diarrhea and cellulitis to leg. She then went to SNF for rehab.   Medications Reviewed Today     Reviewed by [name] Pedro, RN (Registered Nurse) on 03/28/21 at [place] List Status: <None>   Medication Order Taking? Sig Documenting Provider Last Dose Status Informant  acetaminophen (TYLENOL) 500 MG tablet [id number] No Take 500-1,000 mg by mouth every 6 (six) hours as needed for mild pain or moderate pain. [provider] prn unknown Active Child  apixaban (ELIQUIS) 5 MG TABS tablet [id number] No Take 5 mg by mouth 2 (two) times daily. [provider] 02/24/2021 0800 Active Child  DULoxetine (CYMBALTA) 60 MG capsule [id number] No Take 60 mg by  mouth daily. [provider] 02/24/2021 0800 Active Child  ipratropium-albuterol (DUONEB) 0.5-2.5 (3) MG/3ML SOLN [id number] No Take 3 mLs by nebulization every 6 (six) hours as needed (shortness of breath). Loletha Grayer, MD prn unknown Active Child  levothyroxine (SYNTHROID) 200 MCG tablet [id number] No Take 200 mcg by mouth daily. [provider] 02/24/2021 0800 Active Child  metoprolol succinate (TOPROL-XL) 25 MG 24 hr tablet [id number] No Take 0.5 tablets (12.5 mg total) by mouth at bedtime. Loletha Grayer, MD 02/23/2021 2000 Active Child  montelukast (SINGULAIR) 10 MG tablet [id number] No Take 10 mg by mouth daily. [provider] 02/23/2021 2000 Active Child  Multiple Vitamins-Minerals (PRESERVISION AREDS 2+MULTI VIT) CAPS [id number] No Take 1 capsule by mouth as directed. [provider] 02/24/2021 0800 Active Child  traZODone (DESYREL) 50 MG tablet [id number] No Take 1 tablet (50 mg total) by mouth at bedtime as needed for sleep. Loletha Grayer, MD 02/23/2021 2000 Active Child  VENTOLIN HFA 108 (90 Base) MCG/ACT inhaler [id number] No Inhale 2 puffs into the lungs every 6 (six) hours as needed for wheezing or shortness of breath. [provider] prn unknown Active Child  vitamin B-12 (CYANOCOBALAMIN) 500 MCG tablet [id number] No Take 500 mcg by mouth daily. [provider] 02/24/2021 0800 Active Child            Fall Risk 02/26/2021 02/27/2021 02/27/2021 02/28/2021 03/28/2021  Falls in the past year? - - - - 1  Was there an injury with Fall? - - - - 1  Fall Risk Category Calculator - - - - 3  Fall Risk Category - - - - High  Patient Fall Risk Level High fall risk  High fall risk High fall risk High fall risk High fall risk  Patient at Risk for Falls Due to - - - - History of fall(s);Impaired balance/gait;Impaired mobility;Impaired vision;Medication side effect  Fall risk Follow up - - - - Education provided;Falls evaluation completed    SDOH  Screenings   Alcohol Screen: Not on file  Depression (PHQ2-9): Low Risk    PHQ-2 Score: 0  Financial Resource Strain: Not on file  Food Insecurity: Not on file  Housing: [place] Risk Score: 0  Physical Activity: Not on file  Social Connections: Not on file  Stress: Not on file  Tobacco Use: Medium Risk   Smoking Tobacco Use: Never   Smokeless Tobacco Use: Never   Passive Exposure: Yes  Transportation Needs: No Transportation Needs   Lack of Transportation (Medical): No   Lack of Transportation (Non-Medical): No    Depression screen PHQ 2/9 03/28/2021  Decreased Interest 0  Down, Depressed, Hopeless 0  PHQ - 2 Score 0    Care Plan : RN Care Manager POC  Updates made by [name] Pedro, RN since 03/28/2021 12:00 AM     Problem: Care Coordination Needs and Ongoing Disease Mgmt Education and Support of Chronic Conditions-CHF,COPD   Priority: High     Long-Range Goal: Development of POC for Mgmt of Chronic Condition-COPD   Start Date: 03/28/2021  Expected End Date: 04/26/2022  Priority: High  Note:   Current Barriers:  Chronic Disease Management support and education needs related to COPD   RNCM Clinical Goal(s):  Patient will verbalize understanding of plan for management of COPD as evidenced by mgmt of chronic condition continue to work with RN Care Manager to address care management and care coordination needs related to  COPD as evidenced by adherence to CM Team Scheduled appointments through collaboration with RN Care manager, provider, and care team.   Interventions: POC sent to PCP upon initial assessment, quarterly and with any changes in patient's conditions Inter-disciplinary care team collaboration (see longitudinal plan of care) Evaluation of current treatment plan related to  self management and patient's adherence to plan as established by provider   COPD Interventions:  (Status:  New goal.) Long Term Goal Advised patient to track and  manage COPD triggers Advised patient to self assesses COPD action plan zone and make appointment with provider if in the yellow zone for 48 hours without improvement Discussed the importance of adequate rest and management of fatigue with COPD  Patient Goals/Self-Care Activities: Take all medications as prescribed Attend all scheduled provider appointments Call provider office for new concerns or questions  eliminate symptom triggers at home follow rescue plan if symptoms flare-up  Follow Up Plan:  Telephone follow up appointment with care management team member scheduled for:  within 3-4 wks The patient has been provided with contact information for the care management team and has been advised to call with any health related questions or concerns.      Long-Range Goal: Development of POC for Mgmt of Chronic Condition-CHF   Start Date: 03/28/2021  Expected End Date: 04/26/2022  Priority: High  Note:   Current Barriers:  Chronic Disease Management support and education needs related to CHF   RNCM Clinical Goal(s):  Patient will verbalize understanding of plan for management of CHF as evidenced by mgmt of chronic condition continue to work with RN Care Manager to address care management and care coordination needs related to  CHF as evidenced by adherence to  CM Team Scheduled appointments through collaboration with RN Care manager, provider, and care team.   Interventions: POC sent to PCP upon initial assessment, quarterly and with any changes in patient's conditions Inter-disciplinary care team collaboration (see longitudinal plan of care) Evaluation of current treatment plan related to  self management and patient's adherence to plan as established by provider   Heart Failure Interventions:  (Status:  New goal.) Long Term Goal Provided education on low sodium diet Assessed need for readable accurate scales in home Discussed the importance of keeping all appointments with  provider  Patient Goals/Self-Care Activities: Take all medications as prescribed Attend all scheduled provider appointments Call provider office for new concerns or questions  track weight in diary use salt in moderation watch for swelling in feet, ankles and legs every day follow rescue plan if symptoms flare-up  Follow Up Plan:  Telephone follow up appointment with care management team member scheduled for:  within 3-4 wks The patient has been provided with contact information for the care management team and has been advised to call with any health related questions or concerns.        Consent: [facility] services reviewed and discussed with patient and daughter. Verbal consent for services given. Patient prefers a call every few weeks and not weekly TOC calls.   Plan: RN CM discussed with patient and caregiver next outreach within 3-4 wks. Patient/Caregiver agrees to care plan and follow up. RN CM will send barriers letter and route encounter to PCP. RN CM will send welcome letter to patient.   Enzo Montgomery, RN,BSN,CCM [place] Management Telephonic Care Management Coordinator Direct Phone: [phone] Toll Free: [phone] Fax: [phone]

## 2021-04-01 DIAGNOSIS — N183 Chronic kidney disease, stage 3 unspecified: Secondary | ICD-10-CM | POA: Diagnosis not present

## 2021-04-01 DIAGNOSIS — J309 Allergic rhinitis, unspecified: Secondary | ICD-10-CM | POA: Diagnosis not present

## 2021-04-01 DIAGNOSIS — Z6841 Body Mass Index (BMI) 40.0 and over, adult: Secondary | ICD-10-CM | POA: Diagnosis not present

## 2021-04-01 DIAGNOSIS — I890 Lymphedema, not elsewhere classified: Secondary | ICD-10-CM | POA: Diagnosis not present

## 2021-04-01 DIAGNOSIS — J439 Emphysema, unspecified: Secondary | ICD-10-CM | POA: Diagnosis not present

## 2021-04-01 DIAGNOSIS — G4733 Obstructive sleep apnea (adult) (pediatric): Secondary | ICD-10-CM | POA: Diagnosis not present

## 2021-04-01 DIAGNOSIS — I130 Hypertensive heart and chronic kidney disease with heart failure and stage 1 through stage 4 chronic kidney disease, or unspecified chronic kidney disease: Secondary | ICD-10-CM | POA: Diagnosis not present

## 2021-04-01 DIAGNOSIS — I4811 Longstanding persistent atrial fibrillation: Secondary | ICD-10-CM | POA: Diagnosis not present

## 2021-04-01 DIAGNOSIS — E210 Primary hyperparathyroidism: Secondary | ICD-10-CM | POA: Diagnosis not present

## 2021-04-02 ENCOUNTER — Other Ambulatory Visit: Payer: Self-pay

## 2021-04-02 ENCOUNTER — Ambulatory Visit (INDEPENDENT_AMBULATORY_CARE_PROVIDER_SITE_OTHER): Payer: Medicare HMO | Admitting: Nurse Practitioner

## 2021-04-02 VITALS — BP 139/66 | HR 68 | Ht 64.0 in | Wt 274.0 lb

## 2021-04-02 DIAGNOSIS — I10 Essential (primary) hypertension: Secondary | ICD-10-CM

## 2021-04-02 DIAGNOSIS — I509 Heart failure, unspecified: Secondary | ICD-10-CM

## 2021-04-02 DIAGNOSIS — I890 Lymphedema, not elsewhere classified: Secondary | ICD-10-CM

## 2021-04-02 MED ORDER — DOXYCYCLINE HYCLATE 100 MG PO CAPS: 100.0000 mg | ORAL_CAPSULE | Freq: Two times a day (BID) | ORAL | 0 refills | Status: DC

## 2021-04-07 ENCOUNTER — Encounter (INDEPENDENT_AMBULATORY_CARE_PROVIDER_SITE_OTHER): Payer: Self-pay | Admitting: Nurse Practitioner

## 2021-04-07 NOTE — Progress Notes (Signed)
Subjective:    Patient ID: [name], female    DOB: 06/08/1930, 88 y.o.   MRN: [id number] Chief Complaint  Patient presents with   Follow-up    Follow up    [name] is a 88 year old female that presents today for lower extremity swelling, weeping and ulcerations.  The patient has recently been living in an assisted living facility and they have not been utilizing wraps or compression for the patient.  She previously had a lymphedema pump that she uses regularly but has not been able to use it due to it being over 24 years old and not being functional.  The patient does try to elevate her lower extremities is much as possible.   Review of Systems  Cardiovascular:  Positive for leg swelling.  Skin:  Positive for wound.  All other systems reviewed and are negative.     Objective:   Physical Exam Vitals reviewed.  HENT:     Head: Normocephalic.  Cardiovascular:     Rate and Rhythm: Normal rate.  Pulmonary:     Effort: Pulmonary effort is normal.  Musculoskeletal:     Right lower leg: 2+ Pitting Edema present.     Left lower leg: 2+ Pitting Edema present.  Skin:    General: Skin is warm and dry.  Neurological:     Mental Status: She is alert and oriented to person, place, and time.  Psychiatric:        Mood and Affect: Mood normal.        Behavior: Behavior normal.        Thought Content: Thought content normal.        Judgment: Judgment normal.    BP 139/66    Pulse 68    Ht 5\' 4"  (1.626 m)    Wt 274 lb (124.3 kg)    BMI 47.03 kg/m   Past Medical History:  Diagnosis Date   Arthritis    Asthma    CHF (congestive heart failure) (HCC)    COPD (chronic obstructive pulmonary disease) (HCC)    Cough    GERD (gastroesophageal reflux disease)    Gout    Hiatal hernia    Hypertension    Hypothyroidism    MRSA (methicillin resistant staph aureus) culture positive    Neuropathic pain of right lower extremity    Peripheral vascular disease ([place])    "poor  circulation"   Seasonal allergies    Shortness of breath dyspnea    Sleep apnea    uses CPAP (sometimes)    Social History   Socioeconomic History   Marital status: Widowed    Spouse name: Not on file   Number of children: Not on file   Years of education: Not on file   Highest education level: Not on file  Occupational History   Not on file  Tobacco Use   Smoking status: Never    Passive exposure: Yes   Smokeless tobacco: Never  Substance and Sexual Activity   Alcohol use: No   Drug use: No   Sexual activity: Not on file  Other Topics Concern   Not on file  Social History Narrative   Not on file   Social Determinants of Health   Financial Resource Strain: Not on file  Food Insecurity: Not on file  Transportation Needs: No Transportation Needs   Lack of Transportation (Medical): No   Lack of Transportation (Non-Medical): No  Physical Activity: Not on file  Stress: Not on file  Social Connections: Not on file  Intimate Partner Violence: Not on file    Past Surgical History:  Procedure Laterality Date   ANKLE ARTHROSCOPY     BACK SURGERY     L4-L5 Decompression   CARDIAC CATHETERIZATION  1995   CATARACT EXTRACTION W/PHACO Right 07/19/2014   Procedure: CATARACT EXTRACTION PHACO AND INTRAOCULAR LENS PLACEMENT (IOC);  Surgeon: Leandrew Koyanagi, MD;  Location: [place] [facility];  Service: Ophthalmology;  Laterality: Right;   CATARACT EXTRACTION W/PHACO Left 08/23/2014   Procedure: CATARACT EXTRACTION PHACO AND INTRAOCULAR LENS PLACEMENT (IOC);  Surgeon: Leandrew Koyanagi, MD;  Location: [place];  Service: Ophthalmology;  Laterality: Left;   CHOLECYSTECTOMY     COLONOSCOPY     PARATHYROIDECTOMY  2003   TONSILLECTOMY     UVULOPALATOPHARYNGOPLASTY      Family History  Problem Relation Age of Onset   Cancer Mother    Hyperlipidemia Son    Diabetes Son    Cancer Maternal Grandmother     Allergies  Allergen Reactions   Aspirin Nausea Only    Prednisone     Other reaction(s): Other (See Comments), Unknown   Sulfa Antibiotics Rash   Sulfacetamide Sodium Rash    CBC Latest Ref Rng & Units 02/28/2021 02/27/2021 02/26/2021  WBC 4.0 - 10.5 K/uL 8.3 8.6 9.0  Hemoglobin 12.0 - 15.0 g/dL 10.0(L) 10.5(L) 10.2(L)  Hematocrit 36.0 - 46.0 % 31.7(L) 32.7(L) 31.4(L)  Platelets 150 - 400 K/uL 214 237 231      CMP     Component Value Date/Time   NA 137 02/28/2021 0437   NA 139 01/19/2013 0313   K 4.5 02/28/2021 0437   K 3.6 01/19/2013 0313   CL 110 02/28/2021 0437   CL 107 01/19/2013 0313   CO2 26 02/28/2021 0437   CO2 28 01/19/2013 0313   GLUCOSE 108 (H) 02/28/2021 0437   GLUCOSE 107 (H) 01/19/2013 0313   BUN 29 (H) 02/28/2021 0437   BUN 20 (H) 01/19/2013 0313   CREATININE 1.01 (H) 02/28/2021 0437   CREATININE 1.00 01/19/2013 0313   CALCIUM 9.8 02/28/2021 0437   CALCIUM 9.8 01/19/2013 0313   PROT 6.6 02/25/2021 0334   PROT 7.0 01/19/2013 0313   ALBUMIN 3.0 (L) 02/25/2021 0334   ALBUMIN 3.3 (L) 01/19/2013 0313   AST 30 02/25/2021 0334   AST 18 01/19/2013 0313   ALT 22 02/25/2021 0334   ALT 13 01/19/2013 0313   ALKPHOS 102 02/25/2021 0334   ALKPHOS 76 01/19/2013 0313   BILITOT 0.9 02/25/2021 0334   BILITOT 0.2 01/19/2013 0313   GFRNONAA 54 (L) 02/28/2021 0437   GFRNONAA 53 (L) 01/19/2013 0313   GFRAA 56 (L) 09/08/2019 1626   GFRAA >60 01/19/2013 0313     No results found.     Assessment & Plan:   1. Lymphedema of both lower extremities No surgery or intervention at this point in time.     I have had a long discussion with the patient regarding venous insufficiency and why it  causes symptoms, specifically venous ulceration . I have discussed with the patient the chronic skin changes that accompany venous insufficiency and the long term sequela such as infection and recurring  ulceration.  Patient will be placed in [employer] which will be changed weekly drainage permitting.   In addition, behavioral modification  including several periods of elevation of the lower extremities during the day will be continued. Achieving a position with the ankles at heart level was stressed to  the patient   The patient is instructed to begin routine exercise, especially walking on a daily basis   She has been compliant with her [id number] pump since 2019, however she can no longer tolerate treatment due to pain and sensitivity which has exacerbated the condition.  She will require the program ability of an [phone] device to resume treatment and prevent further exacerbation.      Following the review of the ultrasound the patient will follow up in four weeks to reassess the degree of swelling and the control that [place] therapy is offering.    2. Essential hypertension Continue antihypertensive medications as already ordered, these medications have been reviewed and there are no changes at this time.   3. Chronic congestive heart failure, unspecified heart failure type [facility]) This is also likely providing component of her swelling   Current Outpatient Medications on File Prior to Visit  Medication Sig Dispense Refill   acetaminophen (TYLENOL) 500 MG tablet Take 500-1,000 mg by mouth every 6 (six) hours as needed for mild pain or moderate pain.     apixaban (ELIQUIS) 5 MG TABS tablet Take 5 mg by mouth 2 (two) times daily.     DULoxetine (CYMBALTA) 60 MG capsule Take 60 mg by mouth daily.     hydrOXYzine (ATARAX) 10 MG tablet Take 10 mg by mouth 2 (two) times daily.     ipratropium-albuterol (DUONEB) 0.5-2.5 (3) MG/3ML SOLN Take 3 mLs by nebulization every 6 (six) hours as needed (shortness of breath). 360 mL 0   levothyroxine (SYNTHROID) 200 MCG tablet Take 200 mcg by mouth daily.     metoprolol succinate (TOPROL-XL) 25 MG 24 hr tablet Take 0.5 tablets (12.5 mg total) by mouth at bedtime. 15 tablet 0   montelukast (SINGULAIR) 10 MG tablet Take 10 mg by mouth daily.     Multiple Vitamins-Minerals (PRESERVISION AREDS 2+MULTI VIT)  CAPS Take 1 capsule by mouth as directed.     traZODone (DESYREL) 50 MG tablet Take 1 tablet (50 mg total) by mouth at bedtime as needed for sleep. 30 tablet 0   VENTOLIN HFA 108 (90 Base) MCG/ACT inhaler Inhale 2 puffs into the lungs every 6 (six) hours as needed for wheezing or shortness of breath.  12   vitamin B-12 (CYANOCOBALAMIN) 500 MCG tablet Take 500 mcg by mouth daily.     No current facility-administered medications on file prior to visit.    There are no Patient Instructions on file for this visit. No follow-ups on file.   Kris Hartmann, NP

## 2021-04-18 ENCOUNTER — Other Ambulatory Visit: Payer: Self-pay

## 2021-04-18 NOTE — Patient Outreach (Signed)
[facility]) Care Management  04/18/2021  [name] [birth year] [id number]   Telephone Assessment    Unsuccessful outreach attempt  to patient. No answer after multiple rings and unable to leave message.     Plan: RN CM will make outreach attempt within 4 business days.  Enzo Montgomery, RN,BSN,CCM [place] Management Telephonic Care Management Coordinator Direct Phone: [phone] Toll Free: [phone] Fax: [phone]

## 2021-04-19 ENCOUNTER — Other Ambulatory Visit: Payer: Self-pay

## 2021-04-19 NOTE — Patient Outreach (Signed)
[facility]) Care Management  04/19/2021  [name] 1930-07-09 [id number]   Telephone Assessment  Successful outreach call placed to patient. She reports she is a little weak and sore. Patient fell earlier this week during the night. She has life alert but did not want to 'wake anyone up' so she waited for about two hrs before calling for help. RN CM reviewed falls/safety measures and stressed importance of pt using device immediately in the event of falls or emergencies. She reports HHPT coming today. She saw PCP last week. Denies any RN CM needs or concerns at this time.    Medications Reviewed Today     Reviewed by [name] Pedro, RN (Registered Nurse) on 04/19/21 at [place] List Status: <None>   Medication Order Taking? Sig Documenting Provider Last Dose Status Informant  acetaminophen (TYLENOL) 500 MG tablet [id number] No Take 500-1,000 mg by mouth every 6 (six) hours as needed for mild pain or moderate pain. [provider] Taking Active Child  apixaban (ELIQUIS) 5 MG TABS tablet [id number] No Take 5 mg by mouth 2 (two) times daily. [provider] Taking Active Child  doxycycline (VIBRAMYCIN) 100 MG capsule [id number]  Take 1 capsule (100 mg total) by mouth 2 (two) times daily. Kris Hartmann, NP  Active   DULoxetine (CYMBALTA) 60 MG capsule [id number] No Take 60 mg by mouth daily. [provider] Taking Active Child  hydrOXYzine (ATARAX) 10 MG tablet [id number] No Take 10 mg by mouth 2 (two) times daily. [provider] Taking Active   ipratropium-albuterol (DUONEB) 0.5-2.5 (3) MG/3ML SOLN [id number] No Take 3 mLs by nebulization every 6 (six) hours as needed (shortness of breath). Loletha Grayer, MD Taking Active Child  levothyroxine (SYNTHROID) 200 MCG tablet [id number] No Take 200 mcg by mouth daily. [provider] Taking Active Child  metoprolol succinate (TOPROL-XL) 25 MG 24 hr tablet [id number] No Take  0.5 tablets (12.5 mg total) by mouth at bedtime. Loletha Grayer, MD Taking Active Child  montelukast (SINGULAIR) 10 MG tablet [id number] No Take 10 mg by mouth daily. [provider] Taking Active Child  Multiple Vitamins-Minerals (PRESERVISION AREDS 2+MULTI VIT) CAPS [id number] No Take 1 capsule by mouth as directed. [provider] Taking Active Child  traZODone (DESYREL) 50 MG tablet [id number] No Take 1 tablet (50 mg total) by mouth at bedtime as needed for sleep. Loletha Grayer, MD Taking Active Child  VENTOLIN HFA 108 [phone] Base) MCG/ACT inhaler [id number] No Inhale 2 puffs into the lungs every 6 (six) hours as needed for wheezing or shortness of breath. [provider] Taking Active Child  vitamin B-12 (CYANOCOBALAMIN) 500 MCG tablet [id number] No Take 500 mcg by mouth daily. [provider] Taking Active Child             Care Plan : RN Care Manager POC  Updates made by [name] Pedro, RN since 04/19/2021 12:00 AM     Problem: Care Coordination Needs and Ongoing Disease Mgmt Education and Support of Chronic Conditions-CHF,COPD   Priority: High     Long-Range Goal: Development of POC for Mgmt of Chronic Condition-COPD   Start Date: 03/28/2021  Expected End Date: 04/26/2022  This Visit's Progress: On track  Priority: High  Note:   Current Barriers:  Chronic Disease Management support and education needs related to COPD   RNCM Clinical Goal(s):  Patient will verbalize understanding of plan for management of COPD as evidenced by mgmt of chronic condition continue to  work with Consulting civil engineer to address care management and care coordination needs related to  COPD as evidenced by adherence to CM Team Scheduled appointments through collaboration with RN Care manager, provider, and care team.   Interventions: POC sent to PCP upon initial assessment, quarterly and with any changes in patient's conditions Inter-disciplinary care team  collaboration (see longitudinal plan of care) Evaluation of current treatment plan related to  self management and patient's adherence to plan as established by provider   COPD Interventions:  (Status:  Goal on track:  Yes.) Long Term Goal Advised patient to track and manage COPD triggers Advised patient to self assesses COPD action plan zone and make appointment with provider if in the yellow zone for 48 hours without improvement Discussed the importance of adequate rest and management of fatigue with COPD  04/19/21-Patient reports sxs stable at present.  Patient Goals/Self-Care Activities: Take all medications as prescribed Attend all scheduled provider appointments Call provider office for new concerns or questions  eliminate symptom triggers at home follow rescue plan if symptoms flare-up  Follow Up Plan:  Telephone follow up appointment with care management team member scheduled for:  within the month of March The patient has been provided with contact information for the care management team and has been advised to call with any health related questions or concerns.      Long-Range Goal: Development of POC for Mgmt of Chronic Condition-CHF   Start Date: 03/28/2021  Expected End Date: 04/26/2022  This Visit's Progress: On track  Priority: High  Note:   Current Barriers:  Chronic Disease Management support and education needs related to CHF   RNCM Clinical Goal(s):  Patient will verbalize understanding of plan for management of CHF as evidenced by mgmt of chronic condition continue to work with RN Care Manager to address care management and care coordination needs related to  CHF as evidenced by adherence to CM Team Scheduled appointments through collaboration with RN Care manager, provider, and care team.   Interventions: POC sent to PCP upon initial assessment, quarterly and with any changes in patient's conditions Inter-disciplinary care team collaboration (see longitudinal  plan of care) Evaluation of current treatment plan related to  self management and patient's adherence to plan as established by provider   Heart Failure Interventions:  (Status:  Goal on track:  Yes.) Long Term Goal Provided education on low sodium diet Assessed need for readable accurate scales in home Discussed the importance of keeping all appointments with provider   04/19/21-Patient reports wgt stable. Chronic BLE edema.   Patient Goals/Self-Care Activities: Take all medications as prescribed Attend all scheduled provider appointments Call provider office for new concerns or questions  track weight in diary use salt in moderation watch for swelling in feet, ankles and legs every day follow rescue plan if symptoms flare-up  Follow Up Plan:  Telephone follow up appointment with care management team member scheduled for:  within the month of March The patient has been provided with contact information for the care management team and has been advised to call with any health related questions or concerns.      Long-Range Goal: Development of POC for Mgmt of Chronic Condition-Falls   Start Date: 04/19/2021  Expected End Date: 06/17/2021  Priority: High  Note:   Current Barriers:  Chronic Disease Management support and education needs related to falls   RNCM Clinical Goal(s):  Patient will verbalize understanding of plan for management of falls as evidenced by no further  falls continue to work with RN Care Manager to address care management and care coordination needs related to  falls as evidenced by adherence to CM Team Scheduled appointments through collaboration with RN Care manager, provider, and care team.   Interventions: POC sent to PCP upon initial assessment, quarterly and with any changes in patient's conditions Inter-disciplinary care team collaboration (see longitudinal plan of care) Evaluation of current treatment plan related to  self management and patient's  adherence to plan as established by provider   Falls Interventions:  (Status:  New goal.) Long Term Goal Provided written and verbal education re: potential causes of falls and Fall prevention strategies Advised patient of importance of notifying provider of falls Assessed for falls since last encounter Assessed patients knowledge of fall risk prevention secondary to previously provided education Assessed working status of life alert bracelet and patient adherence  Patient Goals/Self-Care Activities: Take all medications as prescribed Call provider office for new concerns or questions  Patient will report no further falls Patient will use life alert immediately in the event of medical emergency Patient will continue to work with therapy in the home to improve strength and mobility Patient will discuss with PT necessary DME needed in the home to aide in safety  Follow Up Plan:  Telephone follow up appointment with care management team member scheduled for:  within the month of March The patient has been provided with contact information for the care management team and has been advised to call with any health related questions or concerns.         Plan: RN CM discussed with patient next outreach within the month of March. Patient agrees to care plan and follow up.  Enzo Montgomery, RN,BSN,CCM [facility] Telephonic Care Management Coordinator Direct Phone: [phone] Toll Free: [phone] Fax: [phone]

## 2021-04-23 DIAGNOSIS — M542 Cervicalgia: Secondary | ICD-10-CM | POA: Diagnosis not present

## 2021-04-23 DIAGNOSIS — R296 Repeated falls: Secondary | ICD-10-CM | POA: Diagnosis not present

## 2021-04-23 DIAGNOSIS — G603 Idiopathic progressive neuropathy: Secondary | ICD-10-CM | POA: Diagnosis not present

## 2021-04-23 DIAGNOSIS — R4189 Other symptoms and signs involving cognitive functions and awareness: Secondary | ICD-10-CM | POA: Diagnosis not present

## 2021-04-23 DIAGNOSIS — E559 Vitamin D deficiency, unspecified: Secondary | ICD-10-CM | POA: Diagnosis not present

## 2021-04-23 DIAGNOSIS — R2681 Unsteadiness on feet: Secondary | ICD-10-CM | POA: Diagnosis not present

## 2021-04-23 DIAGNOSIS — R2689 Other abnormalities of gait and mobility: Secondary | ICD-10-CM | POA: Diagnosis not present

## 2021-04-23 DIAGNOSIS — E538 Deficiency of other specified B group vitamins: Secondary | ICD-10-CM | POA: Diagnosis not present

## 2021-04-23 DIAGNOSIS — E038 Other specified hypothyroidism: Secondary | ICD-10-CM | POA: Diagnosis not present

## 2021-04-23 DIAGNOSIS — E519 Thiamine deficiency, unspecified: Secondary | ICD-10-CM | POA: Diagnosis not present

## 2021-04-23 DIAGNOSIS — Z113 Encounter for screening for infections with a predominantly sexual mode of transmission: Secondary | ICD-10-CM | POA: Diagnosis not present

## 2021-04-26 ENCOUNTER — Emergency Department: Payer: Medicare HMO

## 2021-04-26 ENCOUNTER — Inpatient Hospital Stay
Admission: EM | Admit: 2021-04-26 | Discharge: 2021-05-01 | DRG: 690 | Disposition: A | Discharge status: Skilled Nursing Facility | Payer: Medicare HMO | Attending: Internal Medicine | Admitting: Internal Medicine

## 2021-04-26 ENCOUNTER — Encounter: Payer: Self-pay | Admitting: Emergency Medicine

## 2021-04-26 ENCOUNTER — Other Ambulatory Visit: Payer: Self-pay

## 2021-04-26 DIAGNOSIS — Z79899 Other long term (current) drug therapy: Secondary | ICD-10-CM | POA: Diagnosis not present

## 2021-04-26 DIAGNOSIS — N390 Urinary tract infection, site not specified: Secondary | ICD-10-CM | POA: Diagnosis not present

## 2021-04-26 DIAGNOSIS — Z66 Do not resuscitate: Secondary | ICD-10-CM | POA: Diagnosis not present

## 2021-04-26 DIAGNOSIS — G4733 Obstructive sleep apnea (adult) (pediatric): Secondary | ICD-10-CM | POA: Diagnosis present

## 2021-04-26 DIAGNOSIS — N1831 Chronic kidney disease, stage 3a: Secondary | ICD-10-CM

## 2021-04-26 DIAGNOSIS — G319 Degenerative disease of nervous system, unspecified: Secondary | ICD-10-CM | POA: Diagnosis not present

## 2021-04-26 DIAGNOSIS — I959 Hypotension, unspecified: Secondary | ICD-10-CM | POA: Diagnosis not present

## 2021-04-26 DIAGNOSIS — Z79890 Hormone replacement therapy (postmenopausal): Secondary | ICD-10-CM

## 2021-04-26 DIAGNOSIS — N3000 Acute cystitis without hematuria: Secondary | ICD-10-CM

## 2021-04-26 DIAGNOSIS — I480 Paroxysmal atrial fibrillation: Secondary | ICD-10-CM | POA: Diagnosis present

## 2021-04-26 DIAGNOSIS — Z7901 Long term (current) use of anticoagulants: Secondary | ICD-10-CM

## 2021-04-26 DIAGNOSIS — R296 Repeated falls: Secondary | ICD-10-CM | POA: Diagnosis present

## 2021-04-26 DIAGNOSIS — Z809 Family history of malignant neoplasm, unspecified: Secondary | ICD-10-CM | POA: Diagnosis not present

## 2021-04-26 DIAGNOSIS — I872 Venous insufficiency (chronic) (peripheral): Secondary | ICD-10-CM | POA: Diagnosis present

## 2021-04-26 DIAGNOSIS — R062 Wheezing: Secondary | ICD-10-CM

## 2021-04-26 DIAGNOSIS — J449 Chronic obstructive pulmonary disease, unspecified: Secondary | ICD-10-CM | POA: Diagnosis present

## 2021-04-26 DIAGNOSIS — G8929 Other chronic pain: Secondary | ICD-10-CM | POA: Diagnosis present

## 2021-04-26 DIAGNOSIS — K219 Gastro-esophageal reflux disease without esophagitis: Secondary | ICD-10-CM | POA: Diagnosis present

## 2021-04-26 DIAGNOSIS — R2681 Unsteadiness on feet: Secondary | ICD-10-CM | POA: Diagnosis not present

## 2021-04-26 DIAGNOSIS — E039 Hypothyroidism, unspecified: Secondary | ICD-10-CM

## 2021-04-26 DIAGNOSIS — I5032 Chronic diastolic (congestive) heart failure: Secondary | ICD-10-CM | POA: Diagnosis present

## 2021-04-26 DIAGNOSIS — F32A Depression, unspecified: Secondary | ICD-10-CM | POA: Diagnosis present

## 2021-04-26 DIAGNOSIS — M109 Gout, unspecified: Secondary | ICD-10-CM | POA: Diagnosis present

## 2021-04-26 DIAGNOSIS — I4891 Unspecified atrial fibrillation: Secondary | ICD-10-CM | POA: Diagnosis not present

## 2021-04-26 DIAGNOSIS — B9689 Other specified bacterial agents as the cause of diseases classified elsewhere: Secondary | ICD-10-CM | POA: Diagnosis present

## 2021-04-26 DIAGNOSIS — Z6841 Body Mass Index (BMI) 40.0 and over, adult: Secondary | ICD-10-CM

## 2021-04-26 DIAGNOSIS — R0602 Shortness of breath: Secondary | ICD-10-CM | POA: Diagnosis not present

## 2021-04-26 DIAGNOSIS — I130 Hypertensive heart and chronic kidney disease with heart failure and stage 1 through stage 4 chronic kidney disease, or unspecified chronic kidney disease: Secondary | ICD-10-CM | POA: Diagnosis not present

## 2021-04-26 DIAGNOSIS — I482 Chronic atrial fibrillation, unspecified: Secondary | ICD-10-CM | POA: Diagnosis present

## 2021-04-26 DIAGNOSIS — Z833 Family history of diabetes mellitus: Secondary | ICD-10-CM

## 2021-04-26 DIAGNOSIS — N183 Chronic kidney disease, stage 3 unspecified: Secondary | ICD-10-CM | POA: Diagnosis present

## 2021-04-26 DIAGNOSIS — W19XXXA Unspecified fall, initial encounter: Secondary | ICD-10-CM | POA: Diagnosis not present

## 2021-04-26 DIAGNOSIS — I739 Peripheral vascular disease, unspecified: Secondary | ICD-10-CM | POA: Diagnosis not present

## 2021-04-26 DIAGNOSIS — R531 Weakness: Secondary | ICD-10-CM | POA: Diagnosis not present

## 2021-04-26 DIAGNOSIS — M47812 Spondylosis without myelopathy or radiculopathy, cervical region: Secondary | ICD-10-CM | POA: Diagnosis not present

## 2021-04-26 DIAGNOSIS — Z7401 Bed confinement status: Secondary | ICD-10-CM | POA: Diagnosis not present

## 2021-04-26 DIAGNOSIS — Z043 Encounter for examination and observation following other accident: Secondary | ICD-10-CM | POA: Diagnosis not present

## 2021-04-26 LAB — TROPONIN I (HIGH SENSITIVITY): Troponin I (High Sensitivity): 14 ng/L (ref <18)

## 2021-04-26 LAB — BASIC METABOLIC PANEL
Anion gap: 5 (ref 5–15)
BUN: 29 mg/dL — ABNORMAL HIGH (ref 8–23)
CO2: 29 mmol/L (ref 22–32)
Calcium: 10.5 mg/dL — ABNORMAL HIGH (ref 8.9–10.3)
Chloride: 102 mmol/L (ref 98–111)
Creatinine, Ser: 1 mg/dL (ref 0.44–1.00)
GFR, Estimated: 54 mL/min — ABNORMAL LOW (ref >60)
Glucose, Bld: 111 mg/dL — ABNORMAL HIGH (ref 70–99)
Potassium: 3.8 mmol/L (ref 3.5–5.1)
Sodium: 136 mmol/L (ref 135–145)

## 2021-04-26 LAB — URINALYSIS, ROUTINE W REFLEX MICROSCOPIC
Bilirubin Urine: NEGATIVE
Glucose, UA: NEGATIVE mg/dL
Hgb urine dipstick: NEGATIVE
Ketones, ur: NEGATIVE mg/dL
Nitrite: POSITIVE — AB
Protein, ur: NEGATIVE mg/dL
Specific Gravity, Urine: 1.009 (ref 1.005–1.030)
pH: 6 (ref 5.0–8.0)

## 2021-04-26 LAB — CBC
HCT: 33.8 % — ABNORMAL LOW (ref 36.0–46.0)
Hemoglobin: 10.4 g/dL — ABNORMAL LOW (ref 12.0–15.0)
MCH: 26.3 pg (ref 26.0–34.0)
MCHC: 30.8 g/dL (ref 30.0–36.0)
MCV: 85.4 fL (ref 80.0–100.0)
Platelets: 267 10*3/uL (ref 150–400)
RBC: 3.96 MIL/uL (ref 3.87–5.11)
RDW: 14.8 % (ref 11.5–15.5)
WBC: 10.8 10*3/uL — ABNORMAL HIGH (ref 4.0–10.5)
nRBC: 0 % (ref 0.0–0.2)

## 2021-04-26 LAB — TSH: TSH: 0.015 u[IU]/mL — ABNORMAL LOW (ref 0.350–4.500)

## 2021-04-26 LAB — MAGNESIUM: Magnesium: 1.6 mg/dL — ABNORMAL LOW (ref 1.7–2.4)

## 2021-04-26 LAB — CK: Total CK: 50 U/L (ref 38–234)

## 2021-04-26 LAB — BRAIN NATRIURETIC PEPTIDE: B Natriuretic Peptide: 366.2 pg/mL — ABNORMAL HIGH (ref 0.0–100.0)

## 2021-04-26 LAB — T4, FREE: Free T4: 1.82 ng/dL — ABNORMAL HIGH (ref 0.61–1.12)

## 2021-04-26 MED ORDER — TRAZODONE HCL 50 MG PO TABS
50.0000 mg | ORAL_TABLET | Freq: Every evening | ORAL | Status: DC | PRN
  Administered 2021-04-26 – 2021-04-29 (×4): 50 mg via ORAL
  Filled 2021-04-26 (×4): qty 1

## 2021-04-26 MED ORDER — METOPROLOL SUCCINATE ER 25 MG PO TB24
12.5000 mg | ORAL_TABLET | Freq: Every day | ORAL | Status: DC
Start: 2021-04-26 — End: 2021-05-01
  Administered 2021-04-26 – 2021-04-30 (×5): 12.5 mg via ORAL
  Filled 2021-04-26: qty 1
  Filled 2021-04-26: qty 0.5
  Filled 2021-04-26 (×4): qty 1

## 2021-04-26 MED ORDER — LEVOTHYROXINE SODIUM 100 MCG PO TABS
100.0000 ug | ORAL_TABLET | Freq: Every day | ORAL | Status: DC
  Administered 2021-04-26 – 2021-04-27 (×2): 100 ug via ORAL
  Filled 2021-04-26: qty 2
  Filled 2021-04-26: qty 1

## 2021-04-26 MED ORDER — MONTELUKAST SODIUM 10 MG PO TABS
10.0000 mg | ORAL_TABLET | Freq: Every day | ORAL | Status: DC
  Administered 2021-04-26 – 2021-04-30 (×5): 10 mg via ORAL
  Filled 2021-04-26 (×5): qty 1

## 2021-04-26 MED ORDER — SODIUM CHLORIDE 0.9 % IV SOLN
2.0000 g | Freq: Once | INTRAVENOUS | Status: AC
  Administered 2021-04-26: 2 g via INTRAVENOUS
  Filled 2021-04-26: qty 20

## 2021-04-26 MED ORDER — MAGNESIUM SULFATE 2 GM/50ML IV SOLN
2.0000 g | Freq: Once | INTRAVENOUS | Status: AC
  Administered 2021-04-26: 2 g via INTRAVENOUS
  Filled 2021-04-26: qty 50

## 2021-04-26 MED ORDER — SODIUM CHLORIDE 0.9 % IV SOLN
2.0000 g | INTRAVENOUS | Status: DC
  Administered 2021-04-27: 2 g via INTRAVENOUS
  Filled 2021-04-26: qty 2

## 2021-04-26 MED ORDER — ACETAMINOPHEN 500 MG PO TABS
1000.0000 mg | ORAL_TABLET | Freq: Once | ORAL | Status: AC
  Administered 2021-04-26: 1000 mg via ORAL
  Filled 2021-04-26: qty 2

## 2021-04-26 MED ORDER — ONDANSETRON HCL 4 MG/2ML IJ SOLN: 4.0000 mg | Freq: Four times a day (QID) | INTRAMUSCULAR | Status: DC | PRN

## 2021-04-26 MED ORDER — SODIUM CHLORIDE 0.9 % IV SOLN: 2.0000 g | Freq: Once | INTRAVENOUS | Status: DC

## 2021-04-26 MED ORDER — ONDANSETRON HCL 4 MG PO TABS: 4.0000 mg | ORAL_TABLET | Freq: Four times a day (QID) | ORAL | Status: DC | PRN

## 2021-04-26 MED ORDER — GUAIFENESIN 100 MG/5ML PO LIQD
5.0000 mL | ORAL | Status: DC | PRN
  Administered 2021-04-26 – 2021-04-28 (×3): 5 mL via ORAL
  Filled 2021-04-26: qty 5
  Filled 2021-04-26: qty 10
  Filled 2021-04-26: qty 5
  Filled 2021-04-26 (×2): qty 10

## 2021-04-26 MED ORDER — HYDROXYZINE HCL 10 MG PO TABS
10.0000 mg | ORAL_TABLET | Freq: Two times a day (BID) | ORAL | Status: DC
  Administered 2021-04-26 – 2021-05-01 (×11): 10 mg via ORAL
  Filled 2021-04-26 (×13): qty 1

## 2021-04-26 MED ORDER — DULOXETINE HCL 30 MG PO CPEP
60.0000 mg | ORAL_CAPSULE | Freq: Every day | ORAL | Status: DC
  Administered 2021-04-26 – 2021-05-01 (×6): 60 mg via ORAL
  Filled 2021-04-26 (×2): qty 2
  Filled 2021-04-26: qty 1
  Filled 2021-04-26 (×3): qty 2

## 2021-04-26 MED ORDER — APIXABAN 5 MG PO TABS
5.0000 mg | ORAL_TABLET | Freq: Two times a day (BID) | ORAL | Status: DC
  Administered 2021-04-26 – 2021-05-01 (×11): 5 mg via ORAL
  Filled 2021-04-26 (×11): qty 1

## 2021-04-26 MED ORDER — ACETAMINOPHEN 500 MG PO TABS
500.0000 mg | ORAL_TABLET | Freq: Four times a day (QID) | ORAL | Status: DC | PRN
  Administered 2021-04-26 – 2021-04-29 (×3): 1000 mg via ORAL
  Filled 2021-04-26 (×3): qty 2

## 2021-04-26 MED ORDER — VITAMIN B-12 1000 MCG PO TABS
500.0000 ug | ORAL_TABLET | Freq: Every day | ORAL | Status: DC
  Administered 2021-04-26 – 2021-05-01 (×6): 500 ug via ORAL
  Filled 2021-04-26 (×6): qty 1

## 2021-04-26 MED ORDER — DOXYCYCLINE HYCLATE 100 MG PO TABS
100.0000 mg | ORAL_TABLET | Freq: Two times a day (BID) | ORAL | Status: DC
  Administered 2021-04-26 – 2021-04-27 (×3): 100 mg via ORAL
  Filled 2021-04-26 (×3): qty 1

## 2021-04-26 MED ORDER — OCUVITE-LUTEIN PO CAPS
1.0000 | ORAL_CAPSULE | Freq: Every day | ORAL | Status: DC
  Administered 2021-04-26 – 2021-05-01 (×6): 1 via ORAL
  Filled 2021-04-26 (×6): qty 1

## 2021-04-26 MED ORDER — IPRATROPIUM-ALBUTEROL 0.5-2.5 (3) MG/3ML IN SOLN
3.0000 mL | Freq: Four times a day (QID) | RESPIRATORY_TRACT | Status: DC | PRN
  Administered 2021-04-27 – 2021-04-30 (×4): 3 mL via RESPIRATORY_TRACT
  Filled 2021-04-26 (×3): qty 3

## 2021-04-26 NOTE — Evaluation (Signed)
Physical Therapy Evaluation ?Patient Details ?Name: [name] ?MRN: [id number] ?DOB: 08/16/1930 ?Today's Date: 04/26/2021 ? ?History of Present Illness ? [name] is a 88 y.o. female with history of obesity, CHF, COPD, hypertension, hypothyroidism, A-fib on Eliquis, chronic kidney disease, hyperparathyroidism with hypercalcemia who presents to the emergency department with her daughter after she slid out of her recliner tonight onto the floor. ?  ?Clinical Impression ? Pt admitted with above diagnosis. Pt received supine in bed, agreeable to PT eval. Reports need to get to toilet due to urination in bed and need for BM.  Reports home lay out, DME, assist levels from grand daughter/family remain the same since prior admission. Has had recurrent falls since prior admission too. Pt supervision for supine to sit with CGA to stand to RW with mod VC's for hand placement to RW ambulating to toilet. Indep with perihygiene and able to stand with hand rail and modA to stand to RW returning to bed. modA at LE's to return to supine and maxA+2 to return to [facility]. Pt would benefit from STR to improve deficits in bed mobility, walking toelrance, safe use of DME, to prevent future falls and improve mobility. Pt currently with functional limitations due to the deficits listed below (see PT Problem List). Pt will benefit from skilled PT to increase their independence and safety with mobility to allow discharge to the venue listed below.    ? ?Recommendations for follow up therapy are one component of a multi-disciplinary discharge planning process, led by the attending physician.  Recommendations may be updated based on patient status, additional functional criteria and insurance authorization. ? ?Follow Up Recommendations Skilled nursing-short term rehab (<3 hours/day) ? ?  ?Assistance Recommended at Discharge Intermittent Supervision/Assistance  ?Patient can return home with the following ? A little help with walking and/or  transfers;A little help with bathing/dressing/bathroom;Assist for transportation;Help with stairs or ramp for entrance ? ?  ?Equipment Recommendations Other (comment) (TBD by next venue of care)  ?Recommendations for Other Services ?    ?  ?Functional Status Assessment Patient has had a recent decline in their functional status and demonstrates the ability to make significant improvements in function in a reasonable and predictable amount of time.  ? ?  ?Precautions / Restrictions Precautions ?Precautions: Fall ?Restrictions ?Weight Bearing Restrictions: No  ? ?  ? ?Mobility ? Bed Mobility ?Overal bed mobility: Needs Assistance ?Bed Mobility: Supine to Sit, Sit to Supine ?  ?  ?Supine to sit: Supervision, HOB elevated ?Sit to supine: Mod assist ?  ?General bed mobility comments: modA for LE management. MaxA+2 to scoot up in bed ?Patient Response: Cooperative ? ?Transfers ?Overall transfer level: Needs assistance ?Equipment used: Rolling walker (2 wheels) ?Transfers: Sit to/from Stand ?Sit to Stand: Min guard, Mod assist ?  ?  ?  ?  ?  ?General transfer comment: mingaurd from ER stretcher. Heavy modA to stand from toilet with LUE support on vertical hand rail ?  ? ?Ambulation/Gait ?Ambulation/Gait assistance: Min guard ?Gait Distance (Feet): 20 Feet ?Assistive device: Rolling walker (2 wheels) ?Gait Pattern/deviations: Step-to pattern, Trunk flexed, Wide base of support ?Gait velocity: Decreased ?  ?  ?General Gait Details: Good stability throughout gait with RW ? ?Stairs ?  ?  ?  ?  ?  ? ?Wheelchair Mobility ?  ? ?Modified Rankin (Stroke Patients Only) ?  ? ?  ? ?Balance Overall balance assessment: Needs assistance ?Sitting-balance support: Single extremity supported, Feet unsupported ?Sitting balance-Leahy Scale: Fair ?  ?  ?  Standing balance support: Bilateral upper extremity supported, During functional activity ?Standing balance-Leahy Scale: Fair ?Standing balance comment: Reliant on RW ?  ?  ?  ?  ?  ?  ?  ?  ?   ?  ?  ?   ? ? ? ?Pertinent Vitals/Pain Pain Assessment ?Pain Assessment: No/denies pain  ? ? ?Home Living Family/patient expects to be discharged to:: Private residence ?Living Arrangements: Alone ?Available Help at Discharge: Family;Available PRN/intermittently ([place] daughter stays with her 3x/week. Assists in cooking.) ?Type of Home: Apartment ?Home Access: Level entry ?  ?  ?  ?Home Layout: One level ?Home Equipment: Rollator (4 wheels);BSC/3in1;Hospital bed;Other (comment);Grab bars - toilet;Grab bars - tub/shower;Shower seat ?   ?  ?Prior Function Prior Level of Function : Independent/Modified Independent ?  ?  ?  ?  ?  ?  ?Mobility Comments: uses Rollator for all ambulation ?  ?  ? ? ?Hand Dominance  ?   ? ?  ?Extremity/Trunk Assessment  ? Upper Extremity Assessment ?Upper Extremity Assessment: Defer to OT evaluation ?  ? ?Lower Extremity Assessment ?Lower Extremity Assessment: Generalized weakness ?  ? ?Cervical / Trunk Assessment ?Cervical / Trunk Assessment: Normal  ?Communication  ? Communication: No difficulties  ?Cognition Arousal/Alertness: Awake/alert ?Behavior During Therapy: [facility] for tasks assessed/performed ?Overall Cognitive Status: Within Functional Limits for tasks assessed ?  ?  ?  ?  ?  ?  ?  ?  ?  ?  ?  ?  ?  ?  ?  ?  ?General Comments: Appropriate conversation, follows commands. ?  ?  ? ?  ?General Comments   ? ?  ?Exercises Other Exercises ?Other Exercises: Role of PT in acute setting, benefits of therapy due to recurrent falls. Education on safe use of DME  ? ?Assessment/Plan  ?  ?PT Assessment Patient needs continued PT services  ?PT Problem List Decreased strength;Decreased mobility;Decreased safety awareness;Obesity;Decreased activity tolerance;Decreased balance ? ?   ?  ?PT Treatment Interventions DME instruction;Therapeutic exercise;Gait training;Balance training;Stair training;Neuromuscular re-education;Functional mobility training;Therapeutic activities;Patient/family education    ? ?PT Goals (Current goals can be found in the Care Plan section)  ?Acute Rehab PT Goals ?Patient Stated Goal: to prevent future falls, get into assisted living ?PT Goal Formulation: With patient ?Time For Goal Achievement: 05/10/21 ?Potential to Achieve Goals: Good ? ?  ?Frequency Min 2X/week ?  ? ? ?Co-evaluation   ?  ?  ?  ?  ? ? ?  ?AM-PAC PT "6 Clicks" Mobility  ?Outcome Measure Help needed turning from your back to your side while in a flat bed without using bedrails?: A Lot ?Help needed moving from lying on your back to sitting on the side of a flat bed without using bedrails?: A Lot ?Help needed moving to and from a bed to a chair (including a wheelchair)?: A Little ?Help needed standing up from a chair using your arms (e.g., wheelchair or bedside chair)?: A Little ?Help needed to walk in hospital room?: A Little ?Help needed climbing 3-5 steps with a railing? : A Lot ?6 Click Score: 15 ? ?  ?End of Session Equipment Utilized During Treatment: Gait belt ?Activity Tolerance: Patient tolerated treatment well ?Patient left: in bed;with nursing/sitter in room;with call bell/phone within reach ?Nurse Communication: Mobility status ?PT Visit Diagnosis: Other abnormalities of gait and mobility (R26.89);Repeated falls (R29.6);Muscle weakness (generalized) (M62.81) ?  ? ?Time: 3614-4315 ?PT Time Calculation (min) (ACUTE ONLY): 29 min ? ? ?Charges:   PT Evaluation ?$PT  Eval Moderate Complexity: 1 Mod ?PT Treatments ?$Therapeutic Activity: 8-22 mins ?  ?   ?Salem Caster. Fairly IV, PT, DPT ?Physical Therapist- [place]  ?[facility]  ?04/26/2021, 11:03 AM ? ?

## 2021-04-26 NOTE — ED Provider Notes (Signed)
[facility] Provider Note    Event Date/Time   First MD Initiated Contact with Patient 04/26/21 0421     (approximate)   History   Weakness (/) and Fall   HPI  [name] is a 88 y.o. female with history of obesity, CHF, COPD, hypertension, hypothyroidism, A-fib on Eliquis, chronic kidney disease, hyperparathyroidism with hypercalcemia who presents to the emergency department with her daughter after she slid out of her recliner tonight onto the floor.  Patient reports that she lives at home alone.  Daughter states she was recently diagnosed with dementia.  Daughter is trying to get her placed into an assisted living facility.  They state that they have home health resources.  Patient states that she called EMS because she could not get off the floor and was on the floor for "hours".  She states that she did not obtain any injuries from sliding out of the recliner.  Daughter reports she has had several falls in the last few weeks.  Daughter reports her last fall was Wednesday.  She thinks that she has hit her head during some of these falls but no loss of consciousness.  Patient has chronic shortness of breath and lower extremity swelling which is unchanged.  No fever.  Has a dry cough.  No vomiting or diarrhea.  No headache, neck or back pain.  No numbness, tingling or weakness.  History provided by patient and daughter.    Past Medical History:  Diagnosis Date   Arthritis    Asthma    CHF (congestive heart failure) (HCC)    COPD (chronic obstructive pulmonary disease) (HCC)    Cough    GERD (gastroesophageal reflux disease)    Gout    Hiatal hernia    Hypertension    Hypothyroidism    MRSA (methicillin resistant staph aureus) culture positive    Neuropathic pain of right lower extremity    Peripheral vascular disease ([place])    "poor circulation"   Seasonal allergies    Shortness of breath dyspnea    Sleep apnea    uses CPAP (sometimes)    Past  Surgical History:  Procedure Laterality Date   ANKLE ARTHROSCOPY     BACK SURGERY     L4-L5 Decompression   CARDIAC CATHETERIZATION  1995   CATARACT EXTRACTION W/PHACO Right 07/19/2014   Procedure: CATARACT EXTRACTION PHACO AND INTRAOCULAR LENS PLACEMENT ([place]);  Surgeon: Leandrew Koyanagi, MD;  Location: [facility];  Service: Ophthalmology;  Laterality: Right;   CATARACT EXTRACTION W/PHACO Left 08/23/2014   Procedure: CATARACT EXTRACTION PHACO AND INTRAOCULAR LENS PLACEMENT ([facility]);  Surgeon: Leandrew Koyanagi, MD;  Location: [facility];  Service: Ophthalmology;  Laterality: Left;   CHOLECYSTECTOMY     COLONOSCOPY     PARATHYROIDECTOMY  2003   TONSILLECTOMY     UVULOPALATOPHARYNGOPLASTY      MEDICATIONS:  Prior to Admission medications   Medication Sig Start Date End Date Taking? Authorizing Provider  acetaminophen (TYLENOL) 500 MG tablet Take 500-1,000 mg by mouth every 6 (six) hours as needed for mild pain or moderate pain.    [provider]  apixaban (ELIQUIS) 5 MG TABS tablet Take 5 mg by mouth 2 (two) times daily.    [provider]  doxycycline (VIBRAMYCIN) 100 MG capsule Take 1 capsule (100 mg total) by mouth 2 (two) times daily. 04/02/21   Kris Hartmann, NP  DULoxetine (CYMBALTA) 60 MG capsule Take 60 mg by mouth daily. 05/04/20  [provider]  hydrOXYzine (ATARAX) 10 MG tablet Take 10 mg by mouth 2 (two) times daily. 04/01/21   [provider]  ipratropium-albuterol (DUONEB) 0.5-2.5 (3) MG/3ML SOLN Take 3 mLs by nebulization every 6 (six) hours as needed (shortness of breath). 07/10/20   Loletha Grayer, MD  levothyroxine (SYNTHROID) 200 MCG tablet Take 200 mcg by mouth daily. 06/19/20   [provider]  metoprolol succinate (TOPROL-XL) 25 MG 24 hr tablet Take 0.5 tablets (12.5 mg total) by mouth at bedtime. 07/10/20   Loletha Grayer, MD  montelukast (SINGULAIR) 10 MG tablet Take 10 mg by mouth daily.    [provider]  Multiple Vitamins-Minerals (PRESERVISION AREDS 2+MULTI VIT) CAPS Take 1 capsule by mouth as directed.    [provider]  traZODone (DESYREL) 50 MG tablet Take 1 tablet (50 mg total) by mouth at bedtime as needed for sleep. 07/10/20   Loletha Grayer, MD  VENTOLIN HFA 108 (90 Base) MCG/ACT inhaler Inhale 2 puffs into the lungs every 6 (six) hours as needed for wheezing or shortness of breath.    [provider]  vitamin B-12 (CYANOCOBALAMIN) 500 MCG tablet Take 500 mcg by mouth daily.    [provider]    Physical Exam   Triage Vital Signs: ED Triage Vitals  Enc Vitals Group     BP 04/26/21 0036 125/84     Pulse Rate 04/26/21 0036 76     Resp 04/26/21 0036 19     Temp 04/26/21 0036 98.8 F (37.1 C)     Temp Source 04/26/21 0036 Oral     SpO2 04/26/21 0036 94 %     Weight 04/26/21 0038 251 lb (113.9 kg)     Height 04/26/21 0038 5\' 4"  (1.626 m)     Head Circumference --      Peak Flow --      Pain Score 04/26/21 0037 10     Pain Loc --      Pain Edu? --      Excl. in [place]? --     Most recent vital signs: Vitals:   04/26/21 0630 04/26/21 0700  BP: 139/82 (!) 168/81  Pulse: 88 (!) 51  Resp: 20 (!) 22  Temp:    SpO2: 97% 100%     CONSTITUTIONAL: Alert and oriented and responds appropriately to questions.  Obese, elderly, chronically ill-appearing, in no distress HEAD: Normocephalic; atraumatic EYES: Conjunctivae clear, PERRL, EOMI ENT: normal nose; no rhinorrhea; moist mucous membranes; pharynx without lesions noted; no dental injury; no septal hematoma, no epistaxis; no facial deformity or bony tenderness NECK: Supple, no midline spinal tenderness, step-off or deformity; trachea midline CARD: Irregularly irregular and rate controlled; S1 and S2 appreciated; no murmurs, no clicks, no rubs, no gallops RESP: Normal chest excursion without splinting or tachypnea; breath sounds clear and equal bilaterally; no wheezes, no rhonchi, no  rales; no hypoxia or respiratory distress CHEST:  chest wall stable, no crepitus or ecchymosis or deformity, nontender to palpation; no flail chest ABD/GI: Normal bowel sounds; non-distended; soft, non-tender, no rebound, no guarding; no ecchymosis or other lesions noted PELVIS:  stable, nontender to palpation BACK:  The back appears normal; no midline spinal tenderness, step-off or deformity EXT: Normal ROM in all joints; non-tender to palpation; pitting edema that is symmetric in bilateral lower extremities, extremities warm well perfused, compartments soft, no bony deformity noted of patient's extremities, no joint effusion, compartments are soft, extremities are warm and well-perfused, no ecchymosis SKIN: Normal  color for age and race; warm NEURO: No facial asymmetry, normal speech, moving all extremities equally, reports normal sensation diffusely  ED Results / Procedures / Treatments   LABS: (all labs ordered are listed, but only abnormal results are displayed) Labs Reviewed  BASIC METABOLIC PANEL - Abnormal; Notable for the following components:      Result Value   Glucose, Bld 111 (*)    BUN 29 (*)    Calcium 10.5 (*)    GFR, Estimated 54 (*)    All other components within normal limits  CBC - Abnormal; Notable for the following components:   WBC 10.8 (*)    Hemoglobin 10.4 (*)    HCT 33.8 (*)    All other components within normal limits  URINALYSIS, ROUTINE W REFLEX MICROSCOPIC - Abnormal; Notable for the following components:   Color, Urine YELLOW (*)    APPearance HAZY (*)    Nitrite POSITIVE (*)    Leukocytes,Ua MODERATE (*)    Bacteria, UA FEW (*)    All other components within normal limits  TSH - Abnormal; Notable for the following components:   TSH 0.015 (*)    All other components within normal limits  MAGNESIUM - Abnormal; Notable for the following components:   Magnesium 1.6 (*)    All other components within normal limits  T4, FREE - Abnormal; Notable for  the following components:   Free T4 1.82 (*)    All other components within normal limits  BRAIN NATRIURETIC PEPTIDE - Abnormal; Notable for the following components:   B Natriuretic Peptide 366.2 (*)    All other components within normal limits  URINE CULTURE  CK  TROPONIN I (HIGH SENSITIVITY)     EKG:  EKG Interpretation  Date/Time:  Friday April 26 2021 00:37:22 EST Ventricular Rate:  87 PR Interval:    QRS Duration: 94 QT Interval:  346 QTC Calculation: 416 R Axis:   -15 Text Interpretation: Atrial fibrillation with premature ventricular or aberrantly conducted complexes Low voltage QRS Cannot rule out Anterior infarct , age undetermined Abnormal ECG When compared with ECG of 25-Feb-2021 03:34, PREVIOUS ECG IS PRESENT Confirmed by Pryor Curia [phone]) on 04/26/2021 5:52:35 AM          RADIOLOGY: My personal review and interpretation of imaging: CT head and cervical spine show no acute traumatic injury.  Chest x-ray is clear.  I have personally reviewed all radiology reports. DG Chest 2 View  Result Date: 04/26/2021 CLINICAL DATA:  88 year old female with fall from chair, found down. Shortness of breath. EXAM: CHEST - 2 VIEW COMPARISON:  Portable chest 02/25/2021 and earlier. FINDINGS: Stable lung volumes and mediastinal contours, cardiac size within normal limits. Visualized tracheal air column is within normal limits. No pneumothorax, pulmonary edema, pleural effusion or confluent pulmonary opacity. Exaggerated thoracic kyphosis. No acute osseous abnormality identified. Negative visible bowel gas. IMPRESSION: No acute cardiopulmonary abnormality or acute traumatic injury identified. Electronically Signed   By: Genevie Ann M.D.   On: 04/26/2021 06:46   CT HEAD WO CONTRAST (5MM)  Result Date: 04/26/2021 CLINICAL DATA:  Patient fell out of a chair. EXAM: CT HEAD WITHOUT CONTRAST CT CERVICAL SPINE WITHOUT CONTRAST TECHNIQUE: Multidetector CT imaging of the head and cervical spine  was performed following the standard protocol without intravenous contrast. Multiplanar CT image reconstructions of the cervical spine were also generated. RADIATION DOSE REDUCTION: This exam was performed according to the departmental dose-optimization program which includes automated exposure control, adjustment of the mA  and/or kV according to patient size and/or use of iterative reconstruction technique. COMPARISON:  01/31/2021 FINDINGS: CT HEAD FINDINGS Brain: There is no evidence for acute hemorrhage, hydrocephalus, mass lesion, or abnormal extra-axial fluid collection. No definite CT evidence for acute infarction. Diffuse loss of parenchymal volume is consistent with atrophy. Patchy low attenuation in the deep hemispheric and periventricular white matter is nonspecific, but likely reflects chronic microvascular ischemic demyelination. Vascular: No hyperdense vessel or unexpected calcification. Skull: No evidence for fracture. No worrisome lytic or sclerotic lesion. Sinuses/Orbits: The visualized paranasal sinuses and mastoid air cells are clear. Visualized portions of the globes and intraorbital fat are unremarkable. Other: None. CT CERVICAL SPINE FINDINGS Alignment: Normal. Skull base and vertebrae: No acute fracture. No primary bone lesion or focal pathologic process. Soft tissues and spinal canal: No prevertebral fluid or swelling. No visible canal hematoma. Disc levels: More loss of disc height with endplate degeneration noted C5-6 and C6-7. Diffuse facet osteoarthritis bilaterally. Upper chest: Negative. Other: None. IMPRESSION: 1. No acute intracranial abnormality. 2. Atrophy with chronic small vessel ischemic disease. 3. No cervical fracture or subluxation. 4. Degenerative changes in the cervical spine as above. Electronically Signed   By: Misty Stanley M.D.   On: 04/26/2021 06:17   CT Cervical Spine Wo Contrast  Result Date: 04/26/2021 CLINICAL DATA:  Patient fell out of a chair. EXAM: CT HEAD  WITHOUT CONTRAST CT CERVICAL SPINE WITHOUT CONTRAST TECHNIQUE: Multidetector CT imaging of the head and cervical spine was performed following the standard protocol without intravenous contrast. Multiplanar CT image reconstructions of the cervical spine were also generated. RADIATION DOSE REDUCTION: This exam was performed according to the departmental dose-optimization program which includes automated exposure control, adjustment of the mA and/or kV according to patient size and/or use of iterative reconstruction technique. COMPARISON:  01/31/2021 FINDINGS: CT HEAD FINDINGS Brain: There is no evidence for acute hemorrhage, hydrocephalus, mass lesion, or abnormal extra-axial fluid collection. No definite CT evidence for acute infarction. Diffuse loss of parenchymal volume is consistent with atrophy. Patchy low attenuation in the deep hemispheric and periventricular white matter is nonspecific, but likely reflects chronic microvascular ischemic demyelination. Vascular: No hyperdense vessel or unexpected calcification. Skull: No evidence for fracture. No worrisome lytic or sclerotic lesion. Sinuses/Orbits: The visualized paranasal sinuses and mastoid air cells are clear. Visualized portions of the globes and intraorbital fat are unremarkable. Other: None. CT CERVICAL SPINE FINDINGS Alignment: Normal. Skull base and vertebrae: No acute fracture. No primary bone lesion or focal pathologic process. Soft tissues and spinal canal: No prevertebral fluid or swelling. No visible canal hematoma. Disc levels: More loss of disc height with endplate degeneration noted C5-6 and C6-7. Diffuse facet osteoarthritis bilaterally. Upper chest: Negative. Other: None. IMPRESSION: 1. No acute intracranial abnormality. 2. Atrophy with chronic small vessel ischemic disease. 3. No cervical fracture or subluxation. 4. Degenerative changes in the cervical spine as above. Electronically Signed   By: Misty Stanley M.D.   On: 04/26/2021 06:17      PROCEDURES:  Critical Care performed: No   CRITICAL CARE Performed by: Cyril Mourning Fabien Travelstead   Total critical care time: 0 minutes  Critical care time was exclusive of separately billable procedures and treating other patients.  Critical care was necessary to treat or prevent imminent or life-threatening deterioration.  Critical care was time spent personally by me on the following activities: development of treatment plan with patient and/or surrogate as well as nursing, discussions with consultants, evaluation of patient's response to treatment, examination of patient,  obtaining history from patient or surrogate, ordering and performing treatments and interventions, ordering and review of laboratory studies, ordering and review of radiographic studies, pulse oximetry and re-evaluation of patient's condition.   Marland Kitchen1-3 Lead EKG Interpretation Performed by: Shondrika Hoque, Delice Bison, DO Authorized by: Juwann Sherk, Delice Bison, DO     Interpretation: abnormal     ECG rate:  83   ECG rate assessment: normal     Rhythm: atrial fibrillation     Ectopy: none     Conduction: normal      IMPRESSION / MDM / ASSESSMENT AND PLAN / ED COURSE  I reviewed the triage vital signs and the nursing notes.  Patient here with frequent falls over the past several weeks.  States she slid out of her recliner today but denies any new injury.  Daughter is hoping to get her placed into her living facility.  The patient is on the cardiac monitor to evaluate for evidence of arrhythmia and/or significant heart rate changes.   DIFFERENTIAL DIAGNOSIS (includes but not limited to):   Dehydration, UTI, ACS, arrhythmia, CHF exacerbation, anemia, electrolyte derangement, intracranial hemorrhage, fractures from her falls, rhabdomyolysis   PLAN: We will obtain CBC, BMP, troponin, BNP, chest x-ray, CT of the head and cervical spine, urinalysis.  EKG shows no new ischemic change.  No signs of any injury on exam.  She has chronic  shortness of breath and lower extremity swelling which she states is unchanged.   MEDICATIONS GIVEN IN ED: Medications  acetaminophen (TYLENOL) tablet 1,000 mg (1,000 mg Oral Given 04/26/21 0045)  magnesium sulfate IVPB 2 g 50 mL (0 g Intravenous Stopped 04/26/21 0650)  cefTRIAXone (ROCEPHIN) 2 g in sodium chloride 0.9 % 100 mL IVPB (2 g Intravenous New Bag/Given 04/26/21 0649)     ED COURSE: Patient's labs show mild leukocytosis.  She is also anemic but this appears to be her baseline with hemoglobin of 10.4.  Calcium is elevated which is also chronic for her from her hyperparathyroidism.  Other electrolytes look normal other than a magnesium level of 1.6.  She is getting replacement.  CK level normal.  Her TSH is low but free T4 is elevated but does not appear to be clinically significant.  Troponin negative.  BNP 366 but chest x-ray reviewed by myself and radiologist shows no infiltrate, edema or other acute abnormality.  CT of her head and cervical spine also reviewed by myself and radiologist and show no acute traumatic injury.  She does appear to have a urinary tract infection today which is likely the cause of her frequent falls.  Will give IV Rocephin.  Urine culture is pending.  Will discuss with medicine for admission.   CONSULTS:  Consulted and discussed patient's case with hospitalist, Dr. Francine Graven.  I have recommended admission and consulting physician agrees and will place admission orders.  Patient (and family if present) agree with this plan.   I reviewed all nursing notes, vitals, pertinent previous records.  All labs, EKGs, imaging ordered have been independently reviewed and interpreted by myself.    OUTSIDE RECORDS REVIEWED: Reviewed patient's last admission from 02/25/2021 to 02/28/2021 for nausea, vomiting and diarrhea and A-fib with RVR.         FINAL CLINICAL IMPRESSION(S) / ED DIAGNOSES   Final diagnoses:  Frequent falls  Acute UTI  Hypomagnesemia     Rx / DC Orders    ED Discharge Orders     None        Note:  This  document was prepared using Systems analyst and may include unintentional dictation errors.   Dontarious Schaum, Delice Bison, DO 04/26/21 [phone]

## 2021-04-26 NOTE — Assessment & Plan Note (Signed)
Patient with frequent falls at home secondary to unsteady gait ?She lives alone and has difficulty ambulating ?We will place patient on fall precautions ?PT evaluation ?

## 2021-04-26 NOTE — Assessment & Plan Note (Signed)
Patient is morbidly obese with a BMI of 43.08 kg/m2 ?Complicates overall prognosis and care ?

## 2021-04-26 NOTE — H&P (Addendum)
History and Physical    Patient: [name] TML:[id number] DOB: [birth year] DOA: 04/26/2021 DOS: the patient was seen and examined on 04/26/2021 PCP: Sallee Lange, NP  Patient coming from: Home  Chief Complaint:  Chief Complaint  Patient presents with   Weakness        Fall    HPI: [name] is a 88 y.o. female with medical history significant for hypertension, hypothyroidism, chronic venous stasis, obstructive sleep apnea, GERD, COPD, CHF who presents to the ER via EMS for evaluation after she fell at home. Patient's daughter states that she has had multiple falls at home and the evening prior to her admission she slid out of her recliner and was stuck on the floor for about 2 hours before she was able to call for help. She was recently in a subacute rehab following hospitalization and was discharged home.  Per daughter they had recommended that she should not be home by herself due to increased risk for falls. Patient complains of feeling very weak but denies having any nausea, no vomiting, no changes in her bowel habits, no fever, no chills, no headache, no abdominal pain, no dizziness or lightheadedness.  Review of Systems: As mentioned in the history of present illness. All other systems reviewed and are negative. Past Medical History:  Diagnosis Date   Arthritis    Asthma    CHF (congestive heart failure) (HCC)    COPD (chronic obstructive pulmonary disease) (HCC)    Cough    GERD (gastroesophageal reflux disease)    Gout    Hiatal hernia    Hypertension    Hypothyroidism    MRSA (methicillin resistant staph aureus) culture positive    Neuropathic pain of right lower extremity    Peripheral vascular disease ([place])    "poor circulation"   Seasonal allergies    Shortness of breath dyspnea    Sleep apnea    uses CPAP (sometimes)   Past Surgical History:  Procedure Laterality Date   ANKLE ARTHROSCOPY     BACK SURGERY     L4-L5 Decompression   CARDIAC  CATHETERIZATION  1995   CATARACT EXTRACTION W/PHACO Right 07/19/2014   Procedure: CATARACT EXTRACTION PHACO AND INTRAOCULAR LENS PLACEMENT ([place]);  Surgeon: Leandrew Koyanagi, MD;  Location: [facility];  Service: Ophthalmology;  Laterality: Right;   CATARACT EXTRACTION W/PHACO Left 08/23/2014   Procedure: CATARACT EXTRACTION PHACO AND INTRAOCULAR LENS PLACEMENT ([facility]);  Surgeon: Leandrew Koyanagi, MD;  Location: [facility];  Service: Ophthalmology;  Laterality: Left;   CHOLECYSTECTOMY     COLONOSCOPY     PARATHYROIDECTOMY  2003   TONSILLECTOMY     UVULOPALATOPHARYNGOPLASTY     Social History:  reports that she has never smoked. She has been exposed to tobacco smoke. She has never used smokeless tobacco. She reports that she does not drink alcohol and does not use drugs.  Allergies  Allergen Reactions   Prednisone Other (See Comments)    Irritability   Aspirin Nausea Only   Sulfa Antibiotics Rash   Sulfacetamide Sodium Rash    Family History  Problem Relation Age of Onset   Cancer Mother    Hyperlipidemia Son    Diabetes Son    Cancer Maternal Grandmother     Prior to Admission medications   Medication Sig Start Date End Date Taking? Authorizing Provider  acetaminophen (TYLENOL) 500 MG tablet Take 500-1,000 mg by mouth every 6 (six) hours as needed for mild pain or moderate pain.  [provider]  apixaban (ELIQUIS) 5 MG TABS tablet Take 5 mg by mouth 2 (two) times daily.    [provider]  doxycycline (VIBRAMYCIN) 100 MG capsule Take 1 capsule (100 mg total) by mouth 2 (two) times daily. 04/02/21   Kris Hartmann, NP  DULoxetine (CYMBALTA) 60 MG capsule Take 60 mg by mouth daily. 05/04/20   [provider]  hydrOXYzine (ATARAX) 10 MG tablet Take 10 mg by mouth 2 (two) times daily. 04/01/21   [provider]  ipratropium-albuterol (DUONEB) 0.5-2.5 (3) MG/3ML SOLN Take 3 mLs by nebulization every 6 (six) hours as needed  (shortness of breath). 07/10/20   Loletha Grayer, MD  levothyroxine (SYNTHROID) 200 MCG tablet Take 200 mcg by mouth daily. 06/19/20   [provider]  metoprolol succinate (TOPROL-XL) 25 MG 24 hr tablet Take 0.5 tablets (12.5 mg total) by mouth at bedtime. 07/10/20   Loletha Grayer, MD  montelukast (SINGULAIR) 10 MG tablet Take 10 mg by mouth daily.    [provider]  Multiple Vitamins-Minerals (PRESERVISION AREDS 2+MULTI VIT) CAPS Take 1 capsule by mouth as directed.    [provider]  traZODone (DESYREL) 50 MG tablet Take 1 tablet (50 mg total) by mouth at bedtime as needed for sleep. 07/10/20   Loletha Grayer, MD  VENTOLIN HFA 108 (90 Base) MCG/ACT inhaler Inhale 2 puffs into the lungs every 6 (six) hours as needed for wheezing or shortness of breath.    [provider]  vitamin B-12 (CYANOCOBALAMIN) 500 MCG tablet Take 500 mcg by mouth daily.    [provider]    Physical Exam: Vitals:   04/26/21 0725 04/26/21 0730 04/26/21 0800 04/26/21 0830  BP:  (!) 151/85 (!) 153/72 (!) 164/100  Pulse: 95 97 100 61  Resp: 20 (!) 22 18 18   Temp:      TempSrc:      SpO2: 99% 100% 100% 100%  Weight:      Height:       Physical Exam Constitutional:      Appearance: Normal appearance. She is obese.  HENT:     Head: Normocephalic and atraumatic.     Nose: Nose normal.     Mouth/Throat:     Mouth: Mucous membranes are moist.  Eyes:     Pupils: Pupils are equal, round, and reactive to light.  Cardiovascular:     Rate and Rhythm: Normal rate and regular rhythm.  Pulmonary:     Effort: Pulmonary effort is normal.     Breath sounds: Normal breath sounds.  Abdominal:     General: Bowel sounds are normal.     Palpations: Abdomen is soft.     Tenderness: There is abdominal tenderness.     Comments: Central adiposity.  Suprapubic tenderness  Musculoskeletal:        General: Normal range of motion.     Cervical back: Normal range of motion and  neck supple.  Skin:    General: Skin is warm and dry.  Neurological:     General: No focal deficit present.     Mental Status: She is alert and oriented to person, place, and time.  Psychiatric:        Mood and Affect: Mood normal.        Behavior: Behavior normal.     Data Reviewed: Relevant notes from primary care and specialist visits, past discharge summaries as available in EHR, including Care Everywhere. Prior diagnostic testing as pertinent to current admission  diagnoses Updated medications and problem lists for reconciliation ED course, including vitals, labs, imaging, treatment and response to treatment Triage notes, nursing and pharmacy notes and ED provider's notes Notable results as noted in HPI Labs reviewed.  Patient with significant pyuria Magnesium 1.6, calcium 10.5, BUN 29, creatinine 1.0, white count 10.8, hemoglobin 10.4, hematocrit 33.8, TSH 0.015, free T41.82 CT scan of the head and cervical spine shows no acute intracranial abnormality.Atrophy with chronic small vessel ischemic disease.No cervical fracture or subluxation. Degenerative changes in the cervical spine Chest x-ray reviewed by me shows no evidence of acute cardiopulmonary disease Twelve-lead EKG reviewed by me shows A-fib with low voltage QRS There are no new results to review at this time.  Assessment and Plan: * UTI (urinary tract infection) Patient noted to have significant pyuria with associated leukocytosis Her urine culture yielded E. coli sensitive to cephalosporins We will place patient on Rocephin 2 g IV daily Follow-up results of urine culture  Frequent falls Patient with frequent falls at home secondary to unsteady gait She lives alone and has difficulty ambulating We will place patient on fall precautions PT evaluation  COPD (chronic obstructive pulmonary disease) ([place]) Stable Not acutely exacerbated Continue as needed bronchodilator therapy  Stasis dermatitis of both  legs Patient has Unna boots in place We will request evaluation  Morbid obesity with BMI of 45.0-49.9, adult ([place]) Patient is morbidly obese with a BMI of 67.34 kg/m2 Complicates overall prognosis and care  Gait instability Patient has a history of gait instability and continues to fall despite use of an assistive device. She uses a rolling walker at home We will consult PT Fall precautions  Depression Stable Continue trazodone and Cymbalta  Atrial fibrillation, chronic (HCC) Patient has a history of chronic A-fib and is rate controlled on beta-blockers Continue metoprolol 12.5 mg twice daily Continue apixaban as primary prophylaxis for an acute stroke  CKD (chronic kidney disease) stage 3, GFR 30-59 ml/min (HCC) Patient has chronic kidney disease Renal function is stable Monitor closely during this hospitalization  Hypothyroidism Patient with a history of hypothyroidism Noted to have a low TSH level of 0.015 We will decrease dose of Synthroid to 100 mcg daily       Advance Care Planning:   Code Status: DNR   Consults: Physical therapy, wound care  Family Communication: Greater than 50% of time was spent discussing patient's condition and plan of care with her daughter who was at the bedside.  All questions and concerns have been addressed.  She verbalizes understanding and agrees with the plan.  CODE STATUS was discussed and patient is a DNR.  Severity of Illness: The appropriate patient status for this patient is INPATIENT. Inpatient status is judged to be reasonable and necessary in order to provide the required intensity of service to ensure the patient's safety. The patient's presenting symptoms, physical exam findings, and initial radiographic and laboratory data in the context of their chronic comorbidities is felt to place them at high risk for further clinical deterioration. Furthermore, it is not anticipated that the patient will be medically stable for  discharge from the hospital within 2 midnights of admission.   * I certify that at the point of admission it is my clinical judgment that the patient will require inpatient hospital care spanning beyond 2 midnights from the point of admission due to high intensity of service, high risk for further deterioration and high frequency of surveillance required.*  Author: Collier Bullock, MD 04/26/2021 8:57 AM  For  on call review [email or web address].

## 2021-04-26 NOTE — Assessment & Plan Note (Signed)
Stable ?Not acutely exacerbated ?Continue as needed bronchodilator therapy ?

## 2021-04-26 NOTE — ED Notes (Signed)
Pt sleeping, spontaneously awakened upon RN entering room. Pt alert, NAD, calm, interactive. Denies pain, sob, nausea, numbness tingling or dizziness. Family at [facility].  ?

## 2021-04-26 NOTE — ED Notes (Signed)
Toileting, transfer and change with PT assist.  ?

## 2021-04-26 NOTE — Assessment & Plan Note (Addendum)
Patient has chronic kidney disease ?Renal function is stable ?Monitor closely during this hospitalization ?

## 2021-04-26 NOTE — Assessment & Plan Note (Signed)
Patient has a history of gait instability and continues to fall despite use of an assistive device. ?She uses a rolling walker at home ?We will consult PT ?Fall precautions ?

## 2021-04-26 NOTE — Consult Note (Signed)
WOC Nurse Consult Note: ?Patient receiving care in [facility] ED 6. ?Reason for Consult: BLE venous stasis ?Wound type: no wounds on LLE. Scattered, very small, superficial areas to RLE. ?Pressure Injury POA: Yes/No/NA ?Measurement: ?Wound bed: all are pink ?Drainage (amount, consistency, odor) none ?Periwound: intact, edematous ?Dressing procedure/placement/frequency: ?The BLE unna boots placed Tuesday of this week were in bad shape. The patient reports they get changed weekly. I removed them, cleaned her BLE with soap and water and applied new unna boots. I also gave the patient a new pair of non-skid socks. ?Thank you for the consult.  Unna boots will be changed next Friday if the patient remains in hospital. ? ?Val Riles, RN, MSN, CWOCN, CNS-BC, pager [phone]  ?

## 2021-04-26 NOTE — ED Notes (Signed)
PT in to see/assess ?

## 2021-04-26 NOTE — Assessment & Plan Note (Signed)
Patient has Unna boots in place ?We will request evaluation ?

## 2021-04-26 NOTE — Evaluation (Signed)
Occupational Therapy Evaluation ?Patient Details ?Name: [name] ?MRN: [id number] ?DOB: 1930-08-17 ?Today's Date: 04/26/2021 ? ? ?History of Present Illness Symphonie Williamson is a 88 y.o. female with history of obesity, CHF, COPD, hypertension, hypothyroidism, A-fib on Eliquis, chronic kidney disease, hyperparathyroidism with hypercalcemia who presents to the emergency department with her daughter after she slid out of her recliner tonight onto the floor.  ? ?Clinical Impression ?  ?Patient presenting with decreased Ind in self care, balance, functional mobility/transfers, endurance, and safety awareness. Patient reports living at home alone at [facility] level with use of rollator at baseline. Family stays with her 3x week and assists with groceries and transportation. Patient currently functioning at mod A for bed mobility, standing EOB, and to take several steps along EOB before returning to seated position secondary to fatigue.  Patient will benefit from acute OT to increase overall independence in the areas of ADLs, functional mobility, and safety awareness in order to safely discharge to next venue of care.  ?   ? ?Recommendations for follow up therapy are one component of a multi-disciplinary discharge planning process, led by the attending physician.  Recommendations may be updated based on patient status, additional functional criteria and insurance authorization.  ? ?Follow Up Recommendations ? Skilled nursing-short term rehab (<3 hours/day)  ?  ?Assistance Recommended at Discharge Frequent or constant Supervision/Assistance  ?Patient can return home with the following A lot of help with walking and/or transfers;A lot of help with bathing/dressing/bathroom;Assistance with cooking/housework;Help with stairs or ramp for entrance;Assist for transportation ? ?  ?Functional Status Assessment ? Patient has had a recent decline in their functional status and demonstrates the ability to make significant improvements in  function in a reasonable and predictable amount of time.  ?Equipment Recommendations ? Other (comment) (defer to next venue of care)  ?  ?   ?Precautions / Restrictions Precautions ?Precautions: Fall ?Restrictions ?Weight Bearing Restrictions: No  ? ?  ? ?Mobility Bed Mobility ?Overal bed mobility: Needs Assistance ?Bed Mobility: Supine to Sit, Sit to Supine ?  ?  ?Supine to sit: Mod assist ?Sit to supine: Mod assist ?  ?General bed mobility comments: mod A for B LEs ?  ? ?Transfers ?Overall transfer level: Needs assistance ?Equipment used: 1 person hand held assist ?Transfers: Sit to/from Stand ?Sit to Stand: Mod assist ?  ?  ?  ?  ?  ?  ?  ? ?  ?Balance Overall balance assessment: Needs assistance ?Sitting-balance support: Single extremity supported, Feet unsupported ?Sitting balance-Leahy Scale: Fair ?  ?  ?Standing balance support: Bilateral upper extremity supported, During functional activity ?Standing balance-Leahy Scale: Fair ?  ?  ?  ?  ?  ?  ?  ?  ?  ?  ?  ?  ?   ? ?ADL either performed or assessed with clinical judgement  ? ?ADL Overall ADL's : Needs assistance/impaired ?  ?  ?Grooming: Wash/dry hands;Wash/dry face;Sitting;Set up;Supervision/safety ?  ?  ?  ?  ?  ?  ?  ?  ?  ?  ?  ?  ?  ?  ?  ?  ?   ? ? ? ?Vision Patient Visual Report: No change from baseline ?   ?   ?   ?   ? ?Pertinent Vitals/Pain Pain Assessment ?Pain Assessment: No/denies pain  ? ? ? ?   ?Extremity/Trunk Assessment Upper Extremity Assessment ?Upper Extremity Assessment: Defer to OT evaluation ?  ?Lower Extremity Assessment ?Lower Extremity  Assessment: Generalized weakness ?  ?  ?  ?Communication Communication ?Communication: No difficulties ?  ?Cognition Arousal/Alertness: Awake/alert ?Behavior During Therapy: [facility] for tasks assessed/performed ?Overall Cognitive Status: Within Functional Limits for tasks assessed ?  ?  ?  ?  ?  ?  ?  ?  ?  ?  ?  ?  ?  ?  ?  ?  ?General Comments: Appropriate conversation, follows commands. ?  ?  ?   ?    ?   ? ? ?Home Living Family/patient expects to be discharged to:: Private residence ?Living Arrangements: Alone ?Available Help at Discharge: Family;Available PRN/intermittently (grandchild stays with her 3x/wk) ?Type of Home: Apartment ?Home Access: Level entry ?  ?  ?Home Layout: One level ?  ?  ?Bathroom Shower/Tub: Walk-in shower ?  ?Bathroom Toilet: Standard ?  ?  ?Home Equipment: Rollator (4 wheels);BSC/3in1;Hospital bed;Other (comment);Grab bars - toilet;Grab bars - tub/shower;Shower seat ?  ?Additional Comments: shower chair is missing grips on the feet - unsafe as is ?  ? ?  ?Prior Functioning/Environment Prior Level of Function : Independent/Modified Independent ?  ?  ?  ?  ?  ?  ?Mobility Comments: uses Rollator for all ambulation ?ADLs Comments: Pt reports she is independent for ADLs and most IADLs. She does not drive and daughter assists her with appointments, groceries,etc. ?  ? ?  ?  ?OT Problem List: Decreased strength;Decreased activity tolerance;Impaired balance (sitting and/or standing);Decreased knowledge of use of DME or AE;Decreased safety awareness;Pain;Cardiopulmonary status limiting activity;Decreased knowledge of precautions ?  ?   ?OT Treatment/Interventions: Self-care/ADL training;Therapeutic exercise;Therapeutic activities;Energy conservation;DME and/or AE instruction;Patient/family education;Manual therapy;Balance training  ?  ?OT Goals(Current goals can be found in the care plan section) Acute Rehab OT Goals ?Patient Stated Goal: to go home ?OT Goal Formulation: With patient ?Time For Goal Achievement: 05/10/21 ?Potential to Achieve Goals: Good ?ADL Goals ?Pt Will Perform Grooming: with supervision;standing ?Pt Will Perform Lower Body Dressing: with supervision;sit to/from stand ?Pt Will Transfer to Toilet: with supervision;ambulating ?Pt Will Perform Toileting - Clothing Manipulation and hygiene: with supervision;sit to/from stand  ?OT Frequency: Min 2X/week ?  ? ?   ?AM-PAC OT "6  Clicks" Daily Activity     ?Outcome Measure Help from another person eating meals?: None ?Help from another person taking care of personal grooming?: A Little ?Help from another person toileting, which includes using toliet, bedpan, or urinal?: A Lot ?Help from another person bathing (including washing, rinsing, drying)?: A Lot ?Help from another person to put on and taking off regular upper body clothing?: A Little ?Help from another person to put on and taking off regular lower body clothing?: A Lot ?6 Click Score: 16 ?  ?End of Session Nurse Communication: Mobility status ? ?Activity Tolerance: Patient limited by fatigue ?Patient left: in bed;with call bell/phone within reach;with bed alarm set ? ?OT Visit Diagnosis: Unsteadiness on feet (R26.81);Repeated falls (R29.6);Muscle weakness (generalized) (M62.81)  ?              ?Time: 1610-9604 ?OT Time Calculation (min): 19 min ?Charges:  OT General Charges ?$OT Visit: 1 Visit ?OT Evaluation ?$OT Eval Moderate Complexity: 1 Mod ?OT Treatments ?$Therapeutic Activity: 8-22 mins ? ?Darleen Crocker, MS, OTR/L , CBIS ?ascom [phone]  ?04/26/21, 3:49 PM  ?

## 2021-04-26 NOTE — Assessment & Plan Note (Signed)
Patient has a history of chronic A-fib and is rate controlled on beta-blockers ?Continue metoprolol 12.5 mg twice daily ?Continue apixaban as primary prophylaxis for an acute stroke ?

## 2021-04-26 NOTE — Assessment & Plan Note (Signed)
Patient with a history of hypothyroidism ?Noted to have a low TSH level of 0.015 ?We will decrease dose of Synthroid to 100 mcg daily ?

## 2021-04-26 NOTE — ED Triage Notes (Addendum)
Pt in via [facility] from home, currently living at [employer] independently.  Reports sliding out of recliner tonight, being stuck in floor x approximately 2 hours before calling EMS.  Patient with generalized weakness, denies any complaints from fall. Per EMS, she cannot stand on her own and is unable to safely care for herself alone.  Patient A/Ox4, vitals WDL, NAD noted at this time.   ?

## 2021-04-26 NOTE — ED Notes (Signed)
Daughter leaving at this time. DNR left at [facility].  ?

## 2021-04-26 NOTE — Assessment & Plan Note (Signed)
Patient noted to have significant pyuria with associated leukocytosis ?Her urine culture yielded E. coli sensitive to cephalosporins ?We will place patient on Rocephin 2 g IV daily ?Follow-up results of urine culture ?

## 2021-04-26 NOTE — Assessment & Plan Note (Signed)
Stable ?Continue trazodone and Cymbalta ?

## 2021-04-27 DIAGNOSIS — R2681 Unsteadiness on feet: Secondary | ICD-10-CM

## 2021-04-27 LAB — CBC
HCT: 31.8 % — ABNORMAL LOW (ref 36.0–46.0)
Hemoglobin: 10 g/dL — ABNORMAL LOW (ref 12.0–15.0)
MCH: 26.2 pg (ref 26.0–34.0)
MCHC: 31.4 g/dL (ref 30.0–36.0)
MCV: 83.5 fL (ref 80.0–100.0)
Platelets: 248 10*3/uL (ref 150–400)
RBC: 3.81 MIL/uL — ABNORMAL LOW (ref 3.87–5.11)
RDW: 14.6 % (ref 11.5–15.5)
WBC: 9 10*3/uL (ref 4.0–10.5)
nRBC: 0 % (ref 0.0–0.2)

## 2021-04-27 LAB — BASIC METABOLIC PANEL
Anion gap: 5 (ref 5–15)
BUN: 22 mg/dL (ref 8–23)
CO2: 30 mmol/L (ref 22–32)
Calcium: 10.3 mg/dL (ref 8.9–10.3)
Chloride: 105 mmol/L (ref 98–111)
Creatinine, Ser: 0.84 mg/dL (ref 0.44–1.00)
GFR, Estimated: >60 mL/min (ref >60)
Glucose, Bld: 94 mg/dL (ref 70–99)
Potassium: 4.2 mmol/L (ref 3.5–5.1)
Sodium: 140 mmol/L (ref 135–145)

## 2021-04-27 MED ORDER — TRAMADOL HCL 50 MG PO TABS
50.0000 mg | ORAL_TABLET | Freq: Four times a day (QID) | ORAL | Status: DC | PRN
  Filled 2021-04-27: qty 1

## 2021-04-27 MED ORDER — LEVOTHYROXINE SODIUM 50 MCG PO TABS
125.0000 ug | ORAL_TABLET | Freq: Every day | ORAL | Status: DC
  Administered 2021-04-28 – 2021-05-01 (×4): 125 ug via ORAL
  Filled 2021-04-27 (×4): qty 1

## 2021-04-27 MED ORDER — FUROSEMIDE 20 MG PO TABS
20.0000 mg | ORAL_TABLET | Freq: Two times a day (BID) | ORAL | Status: DC
  Administered 2021-04-27 – 2021-05-01 (×8): 20 mg via ORAL
  Filled 2021-04-27 (×8): qty 1

## 2021-04-27 MED ORDER — NYSTATIN 100000 UNIT/GM EX POWD
Freq: Two times a day (BID) | CUTANEOUS | Status: DC
  Filled 2021-04-27: qty 15

## 2021-04-27 NOTE — TOC Initial Note (Addendum)
Transition of Care (TOC) - Initial/Assessment Note  ? ? ?Patient Details  ?Name: [name] ?MRN: [id number] ?Date of Birth: Jul 01, 1930 ? ?Transition of Care (TOC) CM/SW Contact:    ?Christie Stephanie E Erin Uecker, LCSW ?Phone Number: ?04/27/2021, 1:17 PM ? ?Clinical Narrative:            Per chart review, patient with poor attention and concentration. CSW spoke with patient's daughter [name] via phone. ?Patient lives alone. PCP is [facility]. Patient has a RW. Patient has been using [facility] and PT. Family has also been private paying a sitter 4 hours a few mornings a week.  ?PT and OT are recommending SNF. Patient went to [facility] in January for 23 days. Explained that patient would likely be in her copay days for short term rehab. [name] prefers patient return to [facility] for more short term rehab.  ?Family has been helping her at home but feel she needs more care than they can provide and is asking about ALF Placement following SNF.  [name] is agreeable to referral to [facility]. [name] is aware for ALF placement it is either covered by Medicaid or private pay. She does not think patient qualifies for Medicaid. Referral made to [facility] with [facility] for ALF placement following STR. Also provided [facility] contact info to [place].  ? ?Starting SNF work up. Will need to determine copays based on bed offers and if family is willing to pay for these for STR.    ?Asked Tammy at [facility] to review and confirm if patient is in copay days.   ? ? ?Expected Discharge Plan: [place] ?Barriers to Discharge: Continued Medical Work up ? ? ?Patient Goals and CMS Choice ?Patient states their goals for this hospitalization and ongoing recovery are:: SNF ?CMS Medicare.gov Compare Post Acute Care list provided to:: Patient Represenative (must comment) ?Choice offered to / list presented to : Adult Children ? ?Expected Discharge Plan and Services ?Expected Discharge Plan: [place] ?  ?  ?  ?Living  arrangements for the past 2 months: [place] ?                ?  ?  ?  ?  ?  ?  ?  ?  ?  ?  ? ?Prior Living Arrangements/Services ?Living arrangements for the past 2 months: [place] ?Lives with:: Self ?Patient language and need for interpreter reviewed:: Yes ?Do you feel safe going back to the place where you live?: Yes      ?Need for Family Participation in Patient Care: Yes (Comment) ?Care giver support system in place?: Yes (comment) ?Current home services: DME ?Criminal Activity/Legal Involvement Pertinent to Current Situation/Hospitalization: No - Comment as needed ? ?Activities of Daily Living ?Home Assistive Devices/Equipment: Gilford Rile (specify type) ?ADL Screening (condition at time of admission) ?Patient's cognitive ability adequate to safely complete daily activities?: No ?Is the patient deaf or have difficulty hearing?: No ?Does the patient have difficulty seeing, even when wearing glasses/contacts?: No ?Does the patient have difficulty concentrating, remembering, or making decisions?: No ?Patient able to express need for assistance with ADLs?: Yes ?Does the patient have difficulty dressing or bathing?: No ?Independently performs ADLs?: Yes (appropriate for developmental age) ?Does the patient have difficulty walking or climbing stairs?: No ?Weakness of Legs: Both ?Weakness of Arms/Hands: None ? ?Permission Sought/Granted ?Permission sought to share information with : Customer service manager ?Permission granted to share information with : Yes, Verbal Permission Granted (by  daughter) ?   ? Permission granted to share info w AGENCY: [facility], SNFs ?   ?   ? ?Emotional Assessment ?  ?  ?  ?  ?Alcohol / Substance Use: Not Applicable ?Psych Involvement: No (comment) ? ?Admission diagnosis:  Hypomagnesemia [E83.42] ?UTI (urinary tract infection) [N39.0] ?Acute UTI [N39.0] ?Frequent falls [R29.6] ?Patient Active Problem List  ? Diagnosis Date Noted  ? Frequent falls 04/26/2021  ?  Nausea vomiting and diarrhea 02/25/2021  ? Atrial fibrillation with RVR ([place]) 02/25/2021  ? Acute renal failure superimposed on stage 3a chronic kidney disease ([place]) 02/25/2021  ? Chronic diastolic CHF (congestive heart failure) ([place]) 02/25/2021  ? Severe sepsis ([place]) 02/25/2021  ? Lower extremity cellulitis 02/25/2021  ? Allergic rhinitis 08/10/2020  ? Peripheral neuropathy 08/10/2020  ? Acute delirium   ? Hypomagnesemia   ? Sepsis due to group B Streptococcus without acute organ dysfunction ([place])   ? Acute on chronic diastolic CHF (congestive heart failure) ([place])   ? Atrial fibrillation, chronic ([place])   ? Depression   ? Sepsis secondary to UTI ([place]) 07/05/2020  ? COPD with acute exacerbation ([place])   ? Hypothyroidism   ? Hypertension   ? CKD (chronic kidney disease) stage 3, GFR 30-59 ml/min (HCC)   ? Chronic kidney disease 06/21/2020  ? Secondary hyperaldosteronism ([place]) 06/16/2020  ? Stasis dermatitis of both legs 07/28/2017  ? Pruritic rash 07/03/2017  ? Gait instability 05/27/2017  ? CHF (congestive heart failure) ([place]) 03/27/2017  ? Lower extremity ulceration, left, limited to breakdown of skin ([place]) 03/27/2017  ? High risk medication use 03/12/2017  ? Primary hyperparathyroidism ([place]) 03/12/2017  ? Impaired glucose tolerance 03/12/2017  ? Noncompliance 03/12/2017  ? Parathyroid adenoma 03/12/2017  ? Chronic renal insufficiency, stage 3 (moderate) ([place]) 03/12/2017  ? Mild reactive airways disease 03/08/2016  ? Influenza 03/06/2016  ? Atrial fibrillation ([place]) 03/06/2016  ? Morbid obesity with BMI of 45.0-49.9, adult ([place]) 02/18/2016  ? OSA (obstructive sleep apnea) 02/18/2016  ? CKD (chronic kidney disease), stage II 02/12/2016  ? Swelling of limb 02/12/2016  ? Pain in toes of both feet 01/14/2016  ? Varicose veins of both lower extremities with pain 01/14/2016  ? Lymphedema 01/14/2016  ? Longstanding persistent atrial fibrillation ([place]) 10/04/2015  ? Atrophic vaginitis 12/13/2014  ? Hypercalcemia 12/13/2014   ? Hyperparathyroidism ([employer]) 12/13/2014  ? Chronic UTI (urinary tract infection) 11/29/2014  ? Hyperlipidemia 01/03/2014  ? Lymphedema of both lower extremities 12/08/2013  ? SOB (shortness of breath) 12/08/2013  ? Anxiety 06/15/2013  ? GERD (gastroesophageal reflux disease) 06/15/2013  ? Osteoarthritis 06/15/2013  ? COPD (chronic obstructive pulmonary disease) ([place]) 06/15/2013  ? Essential hypertension 06/15/2013  ? Incomplete emptying of bladder 05/25/2013  ? Mixed incontinence 05/25/2013  ? Renal colic 19/50/9326  ? Renal cyst, acquired 05/25/2013  ? Sciatica 05/25/2013  ? Chronic cystitis 05/25/2013  ? ?PCP:  Sallee Lange, NP ?Pharmacy:   ?[id number] [address] ?[place] ?[place] [address] ?Phone: [phone] Fax: [phone] ? ? ? ? ?Social Determinants of Health (SDOH) Interventions ?  ? ?Readmission Risk Interventions ?Readmission Risk Prevention Plan 04/27/2021  ?Post Dischage Appt Complete  ?Medication Screening Complete  ?Transportation Screening Complete  ?Some recent data might be hidden  ? ? ? ?

## 2021-04-27 NOTE — [place] FL2 (Signed)
?[place] MEDICAID FL2 LEVEL OF CARE SCREENING TOOL  ?  ? ?IDENTIFICATION  ?Patient Name: ?[name] Birthdate: 1930/10/15 Sex: female Admission Date (Current Location): ?04/26/2021  ?[place] and [place] Number: ? [place] ?  Facility and Address:  ?[facility], [address] [place], [place] [address] ?     Provider Number: ?[id number]  ?Attending Physician Name and Address:  ?Gwynne Edinger, MD ? Relative Name and Phone Number:  ?Maurie Boettcher (Daughter)   [phone] [facility]) ?   ?Current Level of Care: ?Hospital Recommended Level of Care: ?[place] Prior Approval Number: ?  ? ?Date Approved/Denied: ?  PASRR Number: ?[id number] ? ?Discharge Plan: ?  ?  ? ?Current Diagnoses: ?Patient Active Problem List  ? Diagnosis Date Noted  ? Frequent falls 04/26/2021  ? Nausea vomiting and diarrhea 02/25/2021  ? Atrial fibrillation with RVR ([place]) 02/25/2021  ? Acute renal failure superimposed on stage 3a chronic kidney disease ([place]) 02/25/2021  ? Chronic diastolic CHF (congestive heart failure) ([place]) 02/25/2021  ? Severe sepsis ([place]) 02/25/2021  ? Lower extremity cellulitis 02/25/2021  ? Allergic rhinitis 08/10/2020  ? Peripheral neuropathy 08/10/2020  ? Acute delirium   ? Hypomagnesemia   ? Sepsis due to group B Streptococcus without acute organ dysfunction ([place])   ? Acute on chronic diastolic CHF (congestive heart failure) ([place])   ? Atrial fibrillation, chronic ([place])   ? Depression   ? Sepsis secondary to UTI ([place]) 07/05/2020  ? COPD with acute exacerbation ([place])   ? Hypothyroidism   ? Hypertension   ? CKD (chronic kidney disease) stage 3, GFR 30-59 ml/min (HCC)   ? Chronic kidney disease 06/21/2020  ? Secondary hyperaldosteronism ([place]) 06/16/2020  ? Stasis dermatitis of both legs 07/28/2017  ? Pruritic rash 07/03/2017  ? Gait instability 05/27/2017  ? CHF (congestive heart failure) ([place]) 03/27/2017  ? Lower extremity ulceration, left, limited to breakdown of skin  ([place]) 03/27/2017  ? High risk medication use 03/12/2017  ? Primary hyperparathyroidism ([place]) 03/12/2017  ? Impaired glucose tolerance 03/12/2017  ? Noncompliance 03/12/2017  ? Parathyroid adenoma 03/12/2017  ? Chronic renal insufficiency, stage 3 (moderate) ([place]) 03/12/2017  ? Mild reactive airways disease 03/08/2016  ? Influenza 03/06/2016  ? Atrial fibrillation ([place]) 03/06/2016  ? Morbid obesity with BMI of 45.0-49.9, adult ([place]) 02/18/2016  ? OSA (obstructive sleep apnea) 02/18/2016  ? CKD (chronic kidney disease), stage II 02/12/2016  ? Swelling of limb 02/12/2016  ? Pain in toes of both feet 01/14/2016  ? Varicose veins of both lower extremities with pain 01/14/2016  ? Lymphedema 01/14/2016  ? Longstanding persistent atrial fibrillation ([place]) 10/04/2015  ? Atrophic vaginitis 12/13/2014  ? Hypercalcemia 12/13/2014  ? Hyperparathyroidism ([place]) 12/13/2014  ? Chronic UTI (urinary tract infection) 11/29/2014  ? Hyperlipidemia 01/03/2014  ? Lymphedema of both lower extremities 12/08/2013  ? SOB (shortness of breath) 12/08/2013  ? Anxiety 06/15/2013  ? GERD (gastroesophageal reflux disease) 06/15/2013  ? Osteoarthritis 06/15/2013  ? COPD (chronic obstructive pulmonary disease) ([place]) 06/15/2013  ? Essential hypertension 06/15/2013  ? Incomplete emptying of bladder 05/25/2013  ? Mixed incontinence 05/25/2013  ? Renal colic 26/71/2458  ? Renal cyst, acquired 05/25/2013  ? Sciatica 05/25/2013  ? Chronic cystitis 05/25/2013  ? ? ?Orientation RESPIRATION BLADDER Height & Weight   ?  ?Self, Time, Situation, Place ? Normal Incontinent, External catheter Weight: 251 lb (113.9 kg) ?Height:  '5\' 4"'$  (162.6 cm)  ?BEHAVIORAL SYMPTOMS/MOOD NEUROLOGICAL BOWEL NUTRITION STATUS  ?  Diet (2 gram sodium)  ?AMBULATORY STATUS COMMUNICATION OF NEEDS Skin   ?Limited Assist Verbally Skin abrasions ?  ?  ?  ?    ?     ?     ? ? ?Personal Care Assistance Level of Assistance  ?Bathing, Feeding, Dressing Bathing Assistance: Limited  assistance ?Feeding assistance: Limited assistance ?Dressing Assistance: Limited assistance ?   ? ?Functional Limitations Info  ?    ?  ?   ? ? ?SPECIAL CARE FACTORS FREQUENCY  ?PT (By licensed PT), OT (By licensed OT)   ?  ?PT Frequency: 5 times per week ?OT Frequency: 5 times per week ?  ?  ?  ?   ? ? ?Contractures    ? ? ?Additional Factors Info  ?Code Status, Allergies Code Status Info: DNR ?Allergies Info: prednisone, aspirin, sulfa antibiotics, sulfacetamide sodium ?  ?  ?  ?   ? ?Current Medications (04/27/2021):  This is the current hospital active medication list ?Current Facility-Administered Medications  ?Medication Dose Route Frequency Provider Last Rate Last Admin  ? acetaminophen (TYLENOL) tablet 500-1,000 mg  500-1,000 mg Oral Q6H PRN Agbata, Tochukwu, MD   1,000 mg at 04/26/21 1733  ? apixaban (ELIQUIS) tablet 5 mg  5 mg Oral BID Agbata, Tochukwu, MD   5 mg at 04/27/21 1029  ? DULoxetine (CYMBALTA) DR capsule 60 mg  60 mg Oral Daily Agbata, Tochukwu, MD   60 mg at 04/27/21 1031  ? furosemide (LASIX) tablet 20 mg  20 mg Oral BID Gwynne Edinger, MD   20 mg at 04/27/21 1234  ? guaiFENesin (ROBITUSSIN) 100 MG/5ML liquid 5 mL  5 mL Oral Q4H PRN Agbata, Tochukwu, MD   5 mL at 04/26/21 1734  ? hydrOXYzine (ATARAX) tablet 10 mg  10 mg Oral BID Agbata, Tochukwu, MD   10 mg at 04/27/21 1029  ? ipratropium-albuterol (DUONEB) 0.5-2.5 (3) MG/3ML nebulizer solution 3 mL  3 mL Nebulization Q6H PRN Agbata, Tochukwu, MD   3 mL at 04/27/21 1258  ? [START ON 04/28/2021] levothyroxine (SYNTHROID) tablet 125 mcg  125 mcg Oral Q0600 Wouk, Ailene Rud, MD      ? metoprolol succinate (TOPROL-XL) 24 hr tablet 12.5 mg  12.5 mg Oral QHS Agbata, Tochukwu, MD   12.5 mg at 04/26/21 2050  ? montelukast (SINGULAIR) tablet 10 mg  10 mg Oral QHS Agbata, Tochukwu, MD   10 mg at 04/26/21 2050  ? multivitamin-lutein (OCUVITE-LUTEIN) capsule 1 capsule  1 capsule Oral Daily Agbata, Tochukwu, MD   1 capsule at 04/27/21 1029  ? nystatin  (MYCOSTATIN/NYSTOP) topical powder   Topical BID Wouk, Ailene Rud, MD      ? ondansetron [facility]) tablet 4 mg  4 mg Oral Q6H PRN Agbata, Tochukwu, MD      ? Or  ? ondansetron (ZOFRAN) injection 4 mg  4 mg Intravenous Q6H PRN Agbata, Tochukwu, MD      ? traMADol (ULTRAM) tablet 50 mg  50 mg Oral Q6H PRN Wouk, Ailene Rud, MD      ? traZODone (DESYREL) tablet 50 mg  50 mg Oral QHS PRN Agbata, Tochukwu, MD   50 mg at 04/26/21 2050  ? vitamin B-12 (CYANOCOBALAMIN) tablet 500 mcg  500 mcg Oral Daily Agbata, Tochukwu, MD   500 mcg at 04/27/21 1030  ? ? ? ?Discharge Medications: ?Please see discharge summary for a list of discharge medications. ? ?Relevant Imaging Results: ? ?Relevant Lab Results: ? ? ?Additional Information ?SS #: [id number]  [id number] ? ?Dijon Cosens E Angelli Baruch, LCSW ? ? ? ? ?

## 2021-04-27 NOTE — Progress Notes (Signed)
PROGRESS NOTE    [name]  FOY:[id number] DOB: Jun 27, 1930 DOA: 04/26/2021 PCP: Sallee Lange, NP  Outpatient Specialists: cardiology, pulmonology, nephrology    Brief Narrative:   [name] is a 88 y.o. female with medical history significant for hypertension, hypothyroidism, chronic venous stasis, obstructive sleep apnea, GERD, COPD, CHF who presents to the ER via EMS for evaluation after she fell at home. Patient's daughter states that she has had multiple falls at home and the evening prior to her admission she slid out of her recliner and was stuck on the floor for about 2 hours before she was able to call for help. She was recently in a subacute rehab following hospitalization and was discharged home.  Per daughter they had recommended that she should not be home by herself due to increased risk for falls. Patient complains of feeling very weak but denies having any nausea, no vomiting, no changes in her bowel habits, no fever, no chills, no headache, no abdominal pain, no dizziness or lightheadedness.   Assessment & Plan:   Principal Problem:   Gait instability Active Problems:   Hypothyroidism   CKD (chronic kidney disease) stage 3, GFR 30-59 ml/min (HCC)   Atrial fibrillation, chronic (HCC)   Depression   Morbid obesity with BMI of 45.0-49.9, adult (HCC)   Stasis dermatitis of both legs   COPD (chronic obstructive pulmonary disease) (HCC)   Frequent falls  # Frequent falls # Debility CT head neg. Likely consequence of multiple underlying comorbidities. PT advising SNF. Will need long-term placement. - TOC consulted  # Asymptomatic bacteriuria culture pending - hold on abx, monitor for symptoms  # COPD/Asthma Stable - home singulair  # Lymphedema Wound nurse consulted, wrapped in unna boots  # Morbid obesity Complicates care  # paroxysmal a fib Rate controlled - cont home metoprolol, apixaban  # HFpEF Quiescent - cont home lasix  #  Chronic pain - home tramadol, duloxetine  # Hypothyroidism TFTs show over-treatment - reduce dose home levothyroxine from 175 to 125 - repeat TFTs 6 wks  # CKD 3a At baseline - monitor  # OSA - cpap qhs ordered      DVT prophylaxis: home apixaban Code Status: DNR Family Communication: daughter updated telephonically 3/4  Level of care: Telemetry Medical Status is: Inpatient Remains inpatient appropriate because: unsafe discharge plan     Consultants:  none  Procedures: none  Antimicrobials:  S/p ceftriaxone    Subjective: Feels generally weak, no pain, tolerating diet  Objective: Vitals:   04/26/21 1549 04/26/21 1937 04/27/21 0358 04/27/21 0749  BP: (!) 153/78 126/65 130/61 (!) 137/50  Pulse: (!) 43 96 69 (!) 108  Resp: '20 20 20 18  '$ Temp: 97.8 F (36.6 C) 98 F (36.7 C) 98.2 F (36.8 C) 98.5 F (36.9 C)  TempSrc:  Oral  Oral  SpO2: 98% 99% 94% 92%  Weight:      Height:        Intake/Output Summary (Last 24 hours) at 04/27/2021 1121 Last data filed at 04/27/2021 0900 Gross per 24 hour  Intake 480 ml  Output 500 ml  Net -20 ml   Filed Weights   04/26/21 0038  Weight: 113.9 kg    Examination:  General exam: Appears calm and comfortable  Respiratory system: Clear to auscultation. Respiratory effort normal. Cardiovascular system: S1 & S2 heard, RRR. No JVD, murmurs, rubs, gallops or clicks.  Gastrointestinal system: Abdomen is obese, soft and nontender. No organomegaly or masses felt.  Normal bowel sounds heard. Central nervous system: Alert and oriented. No focal neurological deficits. Extremities: Symmetric 5 x 5 power. Skin: legs are wrapped below the knees Psychiatry: Judgement and insight appear normal. Mood & affect appropriate.     Data Reviewed: I have personally reviewed following labs and imaging studies  CBC: Recent Labs  Lab 04/26/21 0042 04/27/21 0346  WBC 10.8* 9.0  HGB 10.4* 10.0*  HCT 33.8* 31.8*  MCV 85.4 83.5  PLT  267 831   Basic Metabolic Panel: Recent Labs  Lab 04/26/21 0042 04/27/21 0346  NA 136 140  K 3.8 4.2  CL 102 105  CO2 29 30  GLUCOSE 111* 94  BUN 29* 22  CREATININE 1.00 0.84  CALCIUM 10.5* 10.3  MG 1.6*  --    GFR: Estimated Creatinine Clearance: 57.3 mL/min (by C-G formula based on SCr of 0.84 mg/dL). Liver Function Tests: No results for input(s): AST, ALT, ALKPHOS, BILITOT, PROT, ALBUMIN in the last 168 hours. No results for input(s): LIPASE, AMYLASE in the last 168 hours. No results for input(s): AMMONIA in the last 168 hours. Coagulation Profile: No results for input(s): INR, PROTIME in the last 168 hours. Cardiac Enzymes: Recent Labs  Lab 04/26/21 0042  CKTOTAL 50   BNP (last 3 results) No results for input(s): PROBNP in the last 8760 hours. HbA1C: No results for input(s): HGBA1C in the last 72 hours. CBG: No results for input(s): GLUCAP in the last 168 hours. Lipid Profile: No results for input(s): CHOL, HDL, LDLCALC, TRIG, CHOLHDL, LDLDIRECT in the last 72 hours. Thyroid Function Tests: Recent Labs    04/26/21 0042  TSH 0.015*  FREET4 1.82*   Anemia Panel: No results for input(s): VITAMINB12, FOLATE, FERRITIN, TIBC, IRON, RETICCTPCT in the last 72 hours. Urine analysis:    Component Value Date/Time   COLORURINE YELLOW (A) 04/26/2021 0602   APPEARANCEUR HAZY (A) 04/26/2021 0602   APPEARANCEUR Clear 01/19/2013 1248   LABSPEC 1.009 04/26/2021 0602   LABSPEC 1.004 01/19/2013 1248   PHURINE 6.0 04/26/2021 0602   GLUCOSEU NEGATIVE 04/26/2021 0602   GLUCOSEU Negative 01/19/2013 1248   HGBUR NEGATIVE 04/26/2021 0602   BILIRUBINUR NEGATIVE 04/26/2021 0602   BILIRUBINUR Negative 01/19/2013 1248   KETONESUR NEGATIVE 04/26/2021 0602   PROTEINUR NEGATIVE 04/26/2021 0602   NITRITE POSITIVE (A) 04/26/2021 0602   LEUKOCYTESUR MODERATE (A) 04/26/2021 0602   LEUKOCYTESUR Negative 01/19/2013 1248   Sepsis Labs: '@LABRCNTIP'$ (procalcitonin:4,lacticidven:4)  )No  results found for this or any previous visit (from the past 240 hour(s)).       Radiology Studies: DG Chest 2 View  Result Date: 04/26/2021 CLINICAL DATA:  88 year old female with fall from chair, found down. Shortness of breath. EXAM: CHEST - 2 VIEW COMPARISON:  Portable chest 02/25/2021 and earlier. FINDINGS: Stable lung volumes and mediastinal contours, cardiac size within normal limits. Visualized tracheal air column is within normal limits. No pneumothorax, pulmonary edema, pleural effusion or confluent pulmonary opacity. Exaggerated thoracic kyphosis. No acute osseous abnormality identified. Negative visible bowel gas. IMPRESSION: No acute cardiopulmonary abnormality or acute traumatic injury identified. Electronically Signed   By: Genevie Ann M.D.   On: 04/26/2021 06:46   CT HEAD WO CONTRAST (5MM)  Result Date: 04/26/2021 CLINICAL DATA:  Patient fell out of a chair. EXAM: CT HEAD WITHOUT CONTRAST CT CERVICAL SPINE WITHOUT CONTRAST TECHNIQUE: Multidetector CT imaging of the head and cervical spine was performed following the standard protocol without intravenous contrast. Multiplanar CT image reconstructions of the cervical spine were also  generated. RADIATION DOSE REDUCTION: This exam was performed according to the departmental dose-optimization program which includes automated exposure control, adjustment of the mA and/or kV according to patient size and/or use of iterative reconstruction technique. COMPARISON:  01/31/2021 FINDINGS: CT HEAD FINDINGS Brain: There is no evidence for acute hemorrhage, hydrocephalus, mass lesion, or abnormal extra-axial fluid collection. No definite CT evidence for acute infarction. Diffuse loss of parenchymal volume is consistent with atrophy. Patchy low attenuation in the deep hemispheric and periventricular white matter is nonspecific, but likely reflects chronic microvascular ischemic demyelination. Vascular: No hyperdense vessel or unexpected calcification. Skull: No  evidence for fracture. No worrisome lytic or sclerotic lesion. Sinuses/Orbits: The visualized paranasal sinuses and mastoid air cells are clear. Visualized portions of the globes and intraorbital fat are unremarkable. Other: None. CT CERVICAL SPINE FINDINGS Alignment: Normal. Skull base and vertebrae: No acute fracture. No primary bone lesion or focal pathologic process. Soft tissues and spinal canal: No prevertebral fluid or swelling. No visible canal hematoma. Disc levels: More loss of disc height with endplate degeneration noted C5-6 and C6-7. Diffuse facet osteoarthritis bilaterally. Upper chest: Negative. Other: None. IMPRESSION: 1. No acute intracranial abnormality. 2. Atrophy with chronic small vessel ischemic disease. 3. No cervical fracture or subluxation. 4. Degenerative changes in the cervical spine as above. Electronically Signed   By: Misty Stanley M.D.   On: 04/26/2021 06:17   CT Cervical Spine Wo Contrast  Result Date: 04/26/2021 CLINICAL DATA:  Patient fell out of a chair. EXAM: CT HEAD WITHOUT CONTRAST CT CERVICAL SPINE WITHOUT CONTRAST TECHNIQUE: Multidetector CT imaging of the head and cervical spine was performed following the standard protocol without intravenous contrast. Multiplanar CT image reconstructions of the cervical spine were also generated. RADIATION DOSE REDUCTION: This exam was performed according to the departmental dose-optimization program which includes automated exposure control, adjustment of the mA and/or kV according to patient size and/or use of iterative reconstruction technique. COMPARISON:  01/31/2021 FINDINGS: CT HEAD FINDINGS Brain: There is no evidence for acute hemorrhage, hydrocephalus, mass lesion, or abnormal extra-axial fluid collection. No definite CT evidence for acute infarction. Diffuse loss of parenchymal volume is consistent with atrophy. Patchy low attenuation in the deep hemispheric and periventricular white matter is nonspecific, but likely reflects  chronic microvascular ischemic demyelination. Vascular: No hyperdense vessel or unexpected calcification. Skull: No evidence for fracture. No worrisome lytic or sclerotic lesion. Sinuses/Orbits: The visualized paranasal sinuses and mastoid air cells are clear. Visualized portions of the globes and intraorbital fat are unremarkable. Other: None. CT CERVICAL SPINE FINDINGS Alignment: Normal. Skull base and vertebrae: No acute fracture. No primary bone lesion or focal pathologic process. Soft tissues and spinal canal: No prevertebral fluid or swelling. No visible canal hematoma. Disc levels: More loss of disc height with endplate degeneration noted C5-6 and C6-7. Diffuse facet osteoarthritis bilaterally. Upper chest: Negative. Other: None. IMPRESSION: 1. No acute intracranial abnormality. 2. Atrophy with chronic small vessel ischemic disease. 3. No cervical fracture or subluxation. 4. Degenerative changes in the cervical spine as above. Electronically Signed   By: Misty Stanley M.D.   On: 04/26/2021 06:17        Scheduled Meds:  apixaban  5 mg Oral BID   DULoxetine  60 mg Oral Daily   furosemide  20 mg Oral BID   hydrOXYzine  10 mg Oral BID   [START ON 04/28/2021] levothyroxine  125 mcg Oral Q0600   metoprolol succinate  12.5 mg Oral QHS   montelukast  10 mg Oral  QHS   multivitamin-lutein  1 capsule Oral Daily   vitamin B-12  500 mcg Oral Daily   Continuous Infusions:     LOS: 1 day    Time spent: 40 min    Desma Maxim, MD [facility]   If 7PM-7AM, please contact night-coverage [email or web address] Password [facility] 04/27/2021, 11:21 AM

## 2021-04-27 NOTE — Progress Notes (Addendum)
Mobility Specialist - Progress Note ? ? 04/27/21 1200  ?Mobility  ?Activity Ambulated with assistance in room;Stood at bedside;Dangled on edge of bed;Turned to right side;Turned to left side;Turned to back - supine  ?Level of Assistance Contact guard assist, steadying assist  ?Assistive Device Front wheel walker  ?Distance Ambulated (ft) 10 ft  ?Activity Response Tolerated well  ?$Mobility charge 1 Mobility  ? ? ?Pre-mobility: 82 HR, 93% SpO2 ?During mobility: 74 HR, 95% SpO2 ? ? ?Pt lying in bed upon arrival, utilizing RA. Pt reports headache prior to activity. Able to reach EOB with minA and extra time. Voiced lightheadedness, shakiness, and weakness once seated. Also states she feels "smothered" at times, making it difficult to breathe. Wheezing noted during session. VS checked---WFL throughout session. Pt unsuccessful on multiple attempts to stand from standard bed height; achieved with heavy modA and elevation. Foot blocking on R foot as pt states this extremity usually slides from under her causing falls, vc for hand/foot placement and take-off postioning. Pt ambulated 6' forward/retro with cues for weight shifting. Noted soiled sheets once upright. Also noted moist skin under B breast and R groin area. RN and NT entered to assist with linen change, dressing change, gown change, and bath. Pt took lateral steps towards HOB before returning supine with LE support. Pt left in bed with alarm set, needs in reach. RN notified of performance.  ? ? ?Kathee Delton ?Mobility Specialist ?04/27/21, 1:05 PM ? ? ? ? ?

## 2021-04-28 DIAGNOSIS — R2681 Unsteadiness on feet: Secondary | ICD-10-CM | POA: Diagnosis not present

## 2021-04-28 LAB — BASIC METABOLIC PANEL
Anion gap: 8 (ref 5–15)
BUN: 22 mg/dL (ref 8–23)
CO2: 27 mmol/L (ref 22–32)
Calcium: 10.4 mg/dL — ABNORMAL HIGH (ref 8.9–10.3)
Chloride: 104 mmol/L (ref 98–111)
Creatinine, Ser: 0.78 mg/dL (ref 0.44–1.00)
GFR, Estimated: >60 mL/min (ref >60)
Glucose, Bld: 102 mg/dL — ABNORMAL HIGH (ref 70–99)
Potassium: 4.2 mmol/L (ref 3.5–5.1)
Sodium: 139 mmol/L (ref 135–145)

## 2021-04-28 NOTE — Progress Notes (Signed)
?PROGRESS NOTE ? ? ? ?[name]  BSW:[id number] DOB: [birth year] DOA: 04/26/2021 ?PCP: Sallee Lange, NP  ?Outpatient Specialists: cardiology, pulmonology, nephrology ? ? ? ?Brief Narrative:  ? ?[name] is a 88 y.o. female with medical history significant for hypertension, hypothyroidism, chronic venous stasis, obstructive sleep apnea, GERD, COPD, CHF who presents to the ER via EMS for evaluation after she fell at home. ?Patient's daughter states that she has had multiple falls at home and the evening prior to her admission she slid out of her recliner and was stuck on the floor for about 2 hours before she was able to call for help. ?She was recently in a subacute rehab following hospitalization and was discharged home.  Per daughter they had recommended that she should not be home by herself due to increased risk for falls. ?Patient complains of feeling very weak but denies having any nausea, no vomiting, no changes in her bowel habits, no fever, no chills, no headache, no abdominal pain, no dizziness or lightheadedness. ? ? ?Assessment & Plan: ?  ?Principal Problem: ?  Gait instability ?Active Problems: ?  Hypothyroidism ?  CKD (chronic kidney disease) stage 3, GFR 30-59 ml/min (HCC) ?  Atrial fibrillation, chronic ([place]) ?  Depression ?  Morbid obesity with BMI of 45.0-49.9, adult ([place]) ?  Stasis dermatitis of both legs ?  COPD (chronic obstructive pulmonary disease) ([place]) ?  Frequent falls ? ?# Frequent falls ?# Debility ?CT head neg. Likely consequence of multiple underlying comorbidities. PT advising SNF. Will also need long-term placement. ?- TOC consulted, working on SNF ? ?# Asymptomatic bacteriuria ?culture growing GNRs. Remains asymptoamtic ?- hold on abx, monitor for symptoms ? ?# COPD/Asthma ?Stable ?- home singulair ? ?# Lymphedema ?Wound nurse consulted, wrapped in unna boots ? ?# Morbid obesity ?Complicates care ? ?# paroxysmal a fib ?Rate controlled ?- cont home metoprolol,  apixaban ? ?# HFpEF ?Quiescent ?- cont home lasix ? ?# Chronic pain ?- home tramadol, duloxetine ? ?# Hypothyroidism ?TFTs show over-treatment ?- reduced dose home levothyroxine from 175 to 125 ?- repeat TFTs 6 wks ? ?# CKD 3a ?At baseline ?- monitor ? ?# OSA ?- cpap qhs ordered ? ?  ? ? ?DVT prophylaxis: home apixaban ?Code Status: DNR ?Family Communication: daughter updated telephonically 3/5 ? ?Level of care: Telemetry Medical ?Status is: Inpatient ?Remains inpatient appropriate because: unsafe discharge plan ? ? ? ? ?Consultants:  ?none ? ?Procedures: ?none ? ?Antimicrobials:  ?S/p ceftriaxone  ? ? ?Subjective: ?Feels improved, no pain, tolerating diet ? ?Objective: ?Vitals:  ? 04/28/21 0423 04/28/21 4665 04/28/21 9935 04/28/21 7017  ?BP: (!) 160/60 (!) 150/57  131/67  ?Pulse: 69 79  93  ?Resp: '20 20  18  '$ ?Temp: 98.6 ?F (37 ?C)   98.1 ?F (36.7 ?C)  ?TempSrc: Oral   Oral  ?SpO2: 97% 95% 99% 97%  ?Weight:      ?Height:      ? ? ?Intake/Output Summary (Last 24 hours) at 04/28/2021 1241 ?Last data filed at 04/28/2021 0421 ?Gross per 24 hour  ?Intake 100 ml  ?Output 1000 ml  ?Net -900 ml  ? ?Filed Weights  ? 04/26/21 0038  ?Weight: 113.9 kg  ? ? ?Examination: ? ?General exam: Appears calm and comfortable  ?Respiratory system: Clear to auscultation. Respiratory effort normal. ?Cardiovascular system: S1 & S2 heard, RRR. No JVD, murmurs, rubs, gallops or clicks.  ?Gastrointestinal system: Abdomen is obese, soft and nontender. No organomegaly or masses felt. Normal bowel  sounds heard. ?Central nervous system: Alert and oriented. No focal neurological deficits. ?Extremities: Symmetric 5 x 5 power. ?Skin: legs are wrapped below the knees ?Psychiatry: Judgement and insight appear normal. Mood & affect appropriate.  ? ? ? ?Data Reviewed: I have personally reviewed following labs and imaging studies ? ?CBC: ?Recent Labs  ?Lab 04/26/21 ?3546 04/27/21 ?5681  ?WBC 10.8* 9.0  ?HGB 10.4* 10.0*  ?HCT 33.8* 31.8*  ?MCV 85.4 83.5  ?PLT  267 248  ? ?Basic Metabolic Panel: ?Recent Labs  ?Lab 04/26/21 ?2751 04/27/21 ?7001 04/28/21 ?0444  ?NA 136 140 139  ?K 3.8 4.2 4.2  ?CL 102 105 104  ?CO2 '29 30 27  '$ ?GLUCOSE 111* 94 102*  ?BUN 29* 22 22  ?CREATININE 1.00 0.84 0.78  ?CALCIUM 10.5* 10.3 10.4*  ?MG 1.6*  --   --   ? ?GFR: ?Estimated Creatinine Clearance: 60.2 mL/min (by C-G formula based on SCr of 0.78 mg/dL). ?Liver Function Tests: ?No results for input(s): AST, ALT, ALKPHOS, BILITOT, PROT, ALBUMIN in the last 168 hours. ?No results for input(s): LIPASE, AMYLASE in the last 168 hours. ?No results for input(s): AMMONIA in the last 168 hours. ?Coagulation Profile: ?No results for input(s): INR, PROTIME in the last 168 hours. ?Cardiac Enzymes: ?Recent Labs  ?Lab 04/26/21 ?7494  ?CKTOTAL 50  ? ?BNP (last 3 results) ?No results for input(s): PROBNP in the last 8760 hours. ?HbA1C: ?No results for input(s): HGBA1C in the last 72 hours. ?CBG: ?No results for input(s): GLUCAP in the last 168 hours. ?Lipid Profile: ?No results for input(s): CHOL, HDL, LDLCALC, TRIG, CHOLHDL, LDLDIRECT in the last 72 hours. ?Thyroid Function Tests: ?Recent Labs  ?  04/26/21 ?4967  ?TSH 0.015*  ?FREET4 1.82*  ? ?Anemia Panel: ?No results for input(s): VITAMINB12, FOLATE, FERRITIN, TIBC, IRON, RETICCTPCT in the last 72 hours. ?Urine analysis: ?   ?Component Value Date/Time  ? COLORURINE YELLOW (A) 04/26/2021 0602  ? APPEARANCEUR HAZY (A) 04/26/2021 0602  ? APPEARANCEUR Clear 01/19/2013 1248  ? LABSPEC 1.009 04/26/2021 0602  ? LABSPEC 1.004 01/19/2013 1248  ? PHURINE 6.0 04/26/2021 0602  ? GLUCOSEU NEGATIVE 04/26/2021 0602  ? GLUCOSEU Negative 01/19/2013 1248  ? [place] NEGATIVE 04/26/2021 0602  ? [place] NEGATIVE 04/26/2021 0602  ? BILIRUBINUR Negative 01/19/2013 1248  ? [place] NEGATIVE 04/26/2021 0602  ? [place] NEGATIVE 04/26/2021 0602  ? NITRITE POSITIVE (A) 04/26/2021 0602  ? LEUKOCYTESUR MODERATE (A) 04/26/2021 0602  ? LEUKOCYTESUR Negative 01/19/2013 1248  ? ?Sepsis  Labs: ?'@LABRCNTIP'$ (procalcitonin:4,lacticidven:4) ? ?) ?Recent Results (from the past 240 hour(s))  ?Urine Culture     Status: Abnormal (Preliminary result)  ? Collection Time: 04/26/21  6:32 AM  ? Specimen: Urine, Random  ?Result Value Ref Range Status  ? Specimen Description   Final  ?  URINE, RANDOM ?Performed at [facility], [address] [place], [place] [address] ?  ? Special Requests   Final  ?  NONE ?Performed at [facility], [address] [place], [place] [address] ?  ? Culture >=100,000 COLONIES/mL GRAM NEGATIVE RODS (A)  Final  ? Report Status PENDING  Incomplete  ?  ? ? ? ? ? ?Radiology Studies: ?No results found. ? ? ? ? ? ?Scheduled Meds: ? apixaban  5 mg Oral BID  ? DULoxetine  60 mg Oral Daily  ? furosemide  20 mg Oral BID  ? hydrOXYzine  10 mg Oral BID  ? levothyroxine  125 mcg Oral Q0600  ? metoprolol succinate  12.5  mg Oral QHS  ? montelukast  10 mg Oral QHS  ? multivitamin-lutein  1 capsule Oral Daily  ? nystatin   Topical BID  ? vitamin B-12  500 mcg Oral Daily  ? ?Continuous Infusions: ? ? ? ? LOS: 2 days  ? ? ?Time spent: 30 min ? ? ? ?Desma Maxim, MD ?[facility] ? ? ?If 7PM-7AM, please contact night-coverage ?[email or web address] ?Password [email or web address] ?04/28/2021, 12:41 PM  ?  ? ?

## 2021-04-28 NOTE — Progress Notes (Signed)
Tele called approximately at 0438 to notify RN about 2.3 sec SVR pause. Pt A&O X4, resting, denies chest pain, more than usual SOB, or feeling like her heart is skipping a beat. VS at baseline. Provider B. Randol Kern, NP notified. Came to evaluate at bedside.  ? ?Pt was offered nightly CPAP earlier in the shift but patient declined it. Educated patient on the importance but she states, she has not been using it at home due to her nose mask not fitting well. Care nurse encouraged patient to try the hospital CPAP mask but patient declined and said "I'll let you know if I need it later" ? ?Pt SpO2 remains >94% on room air.  ? ? ?

## 2021-04-29 DIAGNOSIS — R2681 Unsteadiness on feet: Secondary | ICD-10-CM | POA: Diagnosis not present

## 2021-04-29 MED ORDER — ADULT MULTIVITAMIN W/MINERALS CH
1.0000 | ORAL_TABLET | Freq: Every day | ORAL | Status: DC
  Administered 2021-04-30 – 2021-05-01 (×2): 1 via ORAL
  Filled 2021-04-29 (×2): qty 1

## 2021-04-29 MED ORDER — ENSURE ENLIVE PO LIQD
237.0000 mL | Freq: Three times a day (TID) | ORAL | Status: DC
  Administered 2021-04-29 – 2021-05-01 (×6): 237 mL via ORAL

## 2021-04-29 NOTE — Progress Notes (Signed)
Occupational Therapy Treatment ?Patient Details ?Name: [name] ?MRN: [id number] ?DOB: Nov 23, 1930 ?Today's Date: 04/29/2021 ? ? ?History of present illness [name] is a 88 y.o. female with history of obesity, CHF, COPD, hypertension, hypothyroidism, A-fib on Eliquis, chronic kidney disease, hyperparathyroidism with hypercalcemia who presents to the emergency department with her daughter after she slid out of her recliner tonight onto the floor. ?  ?OT comments ? Upon entering the room, pt seated in recliner chair and having just finished with PT. RN also represent in room with morning medications. Pt washes face with set up A while seated in recliner chair to obtain all needed items. Pt reports to staff having just urinated on self in recliner chair. Pt did not notify staff in room in order to assist her with transfer to [facility]. Pt stands with mod lifting assistance and initially heavy posterior bias requiring manual facilitation and cuing for anterior weight shift. Total A for hygiene and linen change in chair. Pt returning to recliner chair and reports being SOB but O2 saturation is at 97% on RA. Pt does have call bell in lap and is able to demonstrate how to call for assist. Pt continues to benefit from OT intervention with recommendation for short term rehab before returning home.   ? ?Recommendations for follow up therapy are one component of a multi-disciplinary discharge planning process, led by the attending physician.  Recommendations may be updated based on patient status, additional functional criteria and insurance authorization. ?   ?Follow Up Recommendations ? Skilled nursing-short term rehab (<3 hours/day)  ?  ?Assistance Recommended at Discharge Frequent or constant Supervision/Assistance  ?Patient can return home with the following ? A lot of help with walking and/or transfers;A lot of help with bathing/dressing/bathroom;Assistance with cooking/housework;Help with stairs or ramp for  entrance;Assist for transportation ?  ?Equipment Recommendations ? Other (comment) (defer to next venue of care)  ?  ?   ?Precautions / Restrictions Precautions ?Precautions: Fall ?Restrictions ?Weight Bearing Restrictions: No  ? ? ?  ? ?Mobility Bed Mobility ?  ?  ?  ?  ?  ?  ?  ?General bed mobility comments: received seated in recliner chair ?  ? ?Transfers ?Overall transfer level: Needs assistance ?Equipment used: Rolling walker (2 wheels) ?Transfers: Sit to/from Stand ?Sit to Stand: Mod assist ?  ?  ?  ?  ?  ?General transfer comment: mod lifting assist to stand from recliner chair with posterior bias noted ?  ?  ?Balance Overall balance assessment: Needs assistance ?Sitting-balance support: Single extremity supported, Feet unsupported ?Sitting balance-Leahy Scale: Fair ?  ?Postural control: Posterior lean ?Standing balance support: Bilateral upper extremity supported, During functional activity, Reliant on assistive device for balance ?Standing balance-Leahy Scale: Poor ?  ?  ?  ?  ?  ?  ?  ?  ?  ?  ?  ?  ?   ? ?ADL either performed or assessed with clinical judgement  ? ?ADL Overall ADL's : Needs assistance/impaired ?  ?  ?Grooming: Wash/dry hands;Wash/dry face;Sitting;Set up;Supervision/safety ?  ?  ?  ?  ?  ?  ?  ?  ?  ?  ?  ?  ?  ?  ?  ?  ?General ADL Comments: Pt urinating on self in recliner chair while therapist is in room. Pt does report she is wet but did not notify RN or OT she needed to transfer to [facility]. ?  ? ?Extremity/Trunk Assessment Upper Extremity Assessment ?Upper Extremity  Assessment: Generalized weakness ?  ?Lower Extremity Assessment ?Lower Extremity Assessment: Generalized weakness ?  ?  ?  ? ?Vision Patient Visual Report: No change from baseline ?  ?  ?   ?   ? ?Cognition Arousal/Alertness: Awake/alert ?Behavior During Therapy: [facility] for tasks assessed/performed ?Overall Cognitive Status: Within Functional Limits for tasks assessed ?  ?  ?  ?  ?  ?  ?  ?  ?  ?  ?  ?  ?  ?  ?  ?  ?General  Comments: Pt follows commands with increased time. She is pleasant and cooperative. ?  ?  ?   ?   ?   ?   ? ? ?Pertinent Vitals/ Pain       Pain Assessment ?Pain Assessment: No/denies pain ? ?   ?   ? ?Frequency ? Min 2X/week  ? ? ? ? ?  ?Progress Toward Goals ? ?OT Goals(current goals can now be found in the care plan section) ? Progress towards OT goals: Progressing toward goals ? ?Acute Rehab OT Goals ?Patient Stated Goal: to get stronger ?OT Goal Formulation: With patient ?Time For Goal Achievement: 05/10/21 ?Potential to Achieve Goals: Good  ?Plan Discharge plan remains appropriate;Frequency remains appropriate   ? ?   ?AM-PAC OT "6 Clicks" Daily Activity     ?Outcome Measure ? ? Help from another person eating meals?: None ?Help from another person taking care of personal grooming?: A Little ?Help from another person toileting, which includes using toliet, bedpan, or urinal?: A Lot ?Help from another person bathing (including washing, rinsing, drying)?: A Lot ?Help from another person to put on and taking off regular upper body clothing?: A Little ?Help from another person to put on and taking off regular lower body clothing?: A Lot ?6 Click Score: 16 ? ?  ?End of Session Equipment Utilized During Treatment: Rolling walker (2 wheels) ? ?OT Visit Diagnosis: Unsteadiness on feet (R26.81);Repeated falls (R29.6);Muscle weakness (generalized) (M62.81) ?  ?Activity Tolerance Patient tolerated treatment well ?  ?Patient Left with call bell/phone within reach;in chair;with chair alarm set ?  ?Nurse Communication Mobility status ?  ? ?   ? ?Time: 3254-9826 ?OT Time Calculation (min): 23 min ? ?Charges: OT General Charges ?$OT Visit: 1 Visit ?OT Treatments ?$Self Care/Home Management : 23-37 mins ? ?Darleen Crocker, MS, OTR/L , CBIS ?ascom [phone]  ?04/29/21, 10:59 AM ? ?

## 2021-04-29 NOTE — TOC Progression Note (Addendum)
Transition of Care (TOC) - Progression Note  ? ? ?Patient Details  ?Name: [name] ?MRN: [id number] ?Date of Birth: March 03, 1930 ? ?Transition of Care (TOC) CM/SW Contact  ?Beverly Sessions, RN ?Phone Number: ?04/29/2021, 11:53 AM ? ?Clinical Narrative:    ? ?[facility] patient has an outstanding balance of $980, and patient would have to pay 7 days of co pays up front which would be $1386.   Patient and daughter aware.  They would like to accept the bed offer.  Daughter to call business office about payment ? ?CMA to start [place]  ?[facility] notified  ? ?Update:  patient not managed by [facility].  Requested [facility] to start auth ? ?Expected Discharge Plan: [place] ?Barriers to Discharge: Continued Medical Work up ? ?Expected Discharge Plan and Services ?Expected Discharge Plan: [place] ?  ?  ?  ?Living arrangements for the past 2 months: [place] ?                ?  ?  ?  ?  ?  ?  ?  ?  ?  ?  ? ? ?Social Determinants of Health (SDOH) Interventions ?  ? ?Readmission Risk Interventions ?Readmission Risk Prevention Plan 04/27/2021  ?Post Dischage Appt Complete  ?Medication Screening Complete  ?Transportation Screening Complete  ?Some recent data might be hidden  ? ? ?

## 2021-04-29 NOTE — Care Management Important Message (Signed)
Important Message ? ?Patient Details  ?Name: [name] ?MRN: [id number] ?Date of Birth: Nov 05, 1930 ? ? ?Medicare Important Message Given:  Yes ? ? ? ? ?Dannette Barbara ?04/29/2021, 11:05 AM ?

## 2021-04-29 NOTE — Progress Notes (Signed)
Physical Therapy Treatment ?Patient Details ?Name: [name] ?MRN: [id number] ?DOB: Feb 12, 1931 ?Today's Date: 04/29/2021 ? ? ?History of Present Illness [name] is a 88 y.o. female with history of obesity, CHF, COPD, hypertension, hypothyroidism, A-fib on Eliquis, chronic kidney disease, hyperparathyroidism with hypercalcemia who presents to the emergency department with her daughter after she slid out of her recliner tonight onto the floor. ? ?  ?PT Comments  ? ? Pt received in bed agreeable to PT session. Pt reporting bed soiled from urine. With HOB maximally elevated pt able to attain sitting EOB with supervision without need for other bed features. Pt requiring modA from EOB to stand to RW with VC's for anterior trunk lean and safe hand placement. Pt does seem to require increased UE support on RW today versus on eval on Friday due to increased trunk lean on RW. Gait does appear slower today too subjectively from pt and from PT observation. Slow step to gait with RW and minguard performed in room to recliner with poor eccentric control upon descent. Education provided on elevation of limbs for edema management and for LE therex to improve edema and LE strength for functional mobility.  Pt reports pain with LE exercises due to swelling in limbs. Pt's LE's elevated and off loaded with pillows with all needs in reach. Pt remains limited in ability to stand on her own and perform household distances safely without being at increased risk of falls. D/c recs remain appropriate. ?  ?Recommendations for follow up therapy are one component of a multi-disciplinary discharge planning process, led by the attending physician.  Recommendations may be updated based on patient status, additional functional criteria and insurance authorization. ? ?Follow Up Recommendations ? Skilled nursing-short term rehab (<3 hours/day) ?  ?  ?Assistance Recommended at Discharge Intermittent Supervision/Assistance  ?Patient can return  home with the following A little help with walking and/or transfers;A little help with bathing/dressing/bathroom;Assist for transportation;Help with stairs or ramp for entrance ?  ?Equipment Recommendations ? Other (comment) (tbd by next venue of care)  ?  ?Recommendations for Other Services   ? ? ?  ?Precautions / Restrictions Precautions ?Precautions: Fall ?Restrictions ?Weight Bearing Restrictions: No  ?  ? ?Mobility ? Bed Mobility ?Overal bed mobility: Needs Assistance ?Bed Mobility: Supine to Sit ?  ?  ?Supine to sit: Supervision, HOB elevated ?  ?  ?General bed mobility comments: Requires HOB maximally elevated. No other bed features required. ?Patient Response: Cooperative ? ?Transfers ?Overall transfer level: Needs assistance ?Equipment used: Rolling walker (2 wheels) ?Transfers: Sit to/from Stand ?Sit to Stand: Mod assist ?  ?  ?  ?  ?  ?General transfer comment: ModA with mod VC's for hand placement and anterior trunk lean to attain standing. ?  ? ?Ambulation/Gait ?Ambulation/Gait assistance: Min guard ?Gait Distance (Feet): 12 Feet ?Assistive device: Rolling walker (2 wheels) ?Gait Pattern/deviations: Step-to pattern, Trunk flexed, Wide base of support ?Gait velocity: Decreased ?  ?  ?General Gait Details: Good stability throughout gait with RW ? ? ?Stairs ?  ?  ?  ?  ?  ? ? ?Wheelchair Mobility ?  ? ?Modified Rankin (Stroke Patients Only) ?  ? ? ?  ?Balance Overall balance assessment: Needs assistance ?Sitting-balance support: Bilateral upper extremity supported, Feet supported ?Sitting balance-Leahy Scale: Fair ?  ?  ?Standing balance support: Bilateral upper extremity supported, During functional activity, Reliant on assistive device for balance ?Standing balance-Leahy Scale: Poor ?Standing balance comment: Reliant on RW with need for  heavy UE use. ?  ?  ?  ?  ?  ?  ?  ?  ?  ?  ?  ?  ? ?  ?Cognition Arousal/Alertness: Awake/alert ?Behavior During Therapy: [facility] for tasks assessed/performed ?Overall  Cognitive Status: Within Functional Limits for tasks assessed ?  ?  ?  ?  ?  ?  ?  ?  ?  ?  ?  ?  ?  ?  ?  ?  ?  ?  ?  ? ?  ?Exercises General Exercises - Lower Extremity ?Ankle Circles/Pumps: AROM, Strengthening, Both, 20 reps, Seated ?Long Arc Quad: AROM, Strengthening, Both, 20 reps, Seated ?Hip Flexion/Marching: AROM, Strengthening, Both, 20 reps, Seated ? ?  ?General Comments   ?  ?  ? ?Pertinent Vitals/Pain Pain Assessment ?Pain Assessment: Faces ?Faces Pain Scale: Hurts little more ?Pain Location: Feet/LE's ?Pain Descriptors / Indicators: Grimacing, Tightness ?Pain Intervention(s): Limited activity within patient's tolerance, Monitored during session, Repositioned  ? ? ?Home Living   ?  ?  ?  ?  ?  ?  ?  ?  ?  ?   ?  ?Prior Function    ?  ?  ?   ? ?PT Goals (current goals can now be found in the care plan section) Acute Rehab PT Goals ?Patient Stated Goal: to prevent future falls, get into assisted living ?PT Goal Formulation: With patient ?Time For Goal Achievement: 05/10/21 ?Potential to Achieve Goals: Good ?Progress towards PT goals: Progressing toward goals ? ?  ?Frequency ? ? ? Min 2X/week ? ? ? ?  ?PT Plan Current plan remains appropriate  ? ? ?Co-evaluation   ?  ?  ?  ?  ? ?  ?AM-PAC PT "6 Clicks" Mobility   ?Outcome Measure ? Help needed turning from your back to your side while in a flat bed without using bedrails?: A Lot ?Help needed moving from lying on your back to sitting on the side of a flat bed without using bedrails?: A Lot ?Help needed moving to and from a bed to a chair (including a wheelchair)?: A Lot ?Help needed standing up from a chair using your arms (e.g., wheelchair or bedside chair)?: A Little ?Help needed to walk in hospital room?: A Little ?Help needed climbing 3-5 steps with a railing? : A Lot ?6 Click Score: 14 ? ?  ?End of Session Equipment Utilized During Treatment: Gait belt ?Activity Tolerance: Patient limited by fatigue ?Patient left: in chair;with call bell/phone within  reach;with chair alarm set ?Nurse Communication: Mobility status ?PT Visit Diagnosis: Other abnormalities of gait and mobility (R26.89);Repeated falls (R29.6);Muscle weakness (generalized) (M62.81) ?  ? ? ?Time: 0017-4944 ?PT Time Calculation (min) (ACUTE ONLY): 24 min ? ?Charges:  $Therapeutic Exercise: 23-37 mins          ?          ? ?Salem Caster. Fairly IV, PT, DPT ?Physical Therapist- [place]  ?[facility]  ?04/29/2021, 11:14 AM ? ?

## 2021-04-29 NOTE — Progress Notes (Signed)
?PROGRESS NOTE ? ? ? ?[name]  QBH:[id number] DOB: 1931/02/08 DOA: 04/26/2021 ?PCP: Sallee Lange, NP  ?Outpatient Specialists: cardiology, pulmonology, nephrology ? ? ? ?Brief Narrative:  ? ?[name] is a 88 y.o. female with medical history significant for hypertension, hypothyroidism, chronic venous stasis, obstructive sleep apnea, GERD, COPD, CHF who presents to the ER via EMS for evaluation after she fell at home. ?Patient's daughter states that she has had multiple falls at home and the evening prior to her admission she slid out of her recliner and was stuck on the floor for about 2 hours before she was able to call for help. ?She was recently in a subacute rehab following hospitalization and was discharged home.  Per daughter they had recommended that she should not be home by herself due to increased risk for falls. ?Patient complains of feeling very weak but denies having any nausea, no vomiting, no changes in her bowel habits, no fever, no chills, no headache, no abdominal pain, no dizziness or lightheadedness. ? ? ?Assessment & Plan: ?  ?Principal Problem: ?  Gait instability ?Active Problems: ?  Hypothyroidism ?  CKD (chronic kidney disease) stage 3, GFR 30-59 ml/min (HCC) ?  Atrial fibrillation, chronic ([place]) ?  Depression ?  Morbid obesity with BMI of 45.0-49.9, adult ([place]) ?  Stasis dermatitis of both legs ?  COPD (chronic obstructive pulmonary disease) ([employer]) ?  Frequent falls ? ?# Frequent falls ?# Debility ?CT head neg. Likely consequence of multiple underlying comorbidities. PT advising SNF. Will also need long-term placement. ?- TOC consulted, working on SNF ? ?# Asymptomatic bacteriuria ?culture growing GNRs. Remains asymptoamtic ?- hold on abx, monitor for symptoms ? ?# COPD/Asthma ?Stable ?- home singulair ? ?# Lymphedema ?Wound nurse consulted, wrapped in unna boots ? ?# Morbid obesity ?Complicates care ? ?# paroxysmal a fib ?Rate controlled ?- cont home metoprolol,  apixaban ? ?# HFpEF ?Quiescent ?- cont home lasix ? ?# Chronic pain ?- home tramadol, duloxetine ? ?# Hypothyroidism ?TFTs show over-treatment ?- reduced dose home levothyroxine from 175 to 125 ?- repeat TFTs 6 wks ? ?# CKD 3a ?At baseline ?- monitor ? ?# OSA ?- cpap qhs ordered ? ?  ? ? ?DVT prophylaxis: home apixaban ?Code Status: DNR ?Family Communication: daughter updated telephonically 3/5 ? ?Level of care: Med-Surg ?Status is: Inpatient ?Remains inpatient appropriate because: unsafe discharge plan ? ? ? ? ?Consultants:  ?none ? ?Procedures: ?none ? ?Antimicrobials:  ?S/p ceftriaxone  ? ? ?Subjective: ?Feels improved, no pain, tolerating diet ? ?Objective: ?Vitals:  ? 04/28/21 1624 04/28/21 1852 04/29/21 0428 04/29/21 0729  ?BP: 107/77 (!) 130/46 (!) 125/59 (!) 119/49  ?Pulse: 97 66 (!) 49 87  ?Resp: 18 (!) '23 18 20  '$ ?Temp: 97.8 ?F (36.6 ?C) 99.1 ?F (37.3 ?C) 98.1 ?F (36.7 ?C) 97.8 ?F (36.6 ?C)  ?TempSrc: Oral Oral Oral   ?SpO2: 100% 97% 96% 95%  ?Weight:      ?Height:      ? ? ?Intake/Output Summary (Last 24 hours) at 04/29/2021 1417 ?Last data filed at 04/29/2021 1041 ?Gross per 24 hour  ?Intake 240 ml  ?Output 2050 ml  ?Net -1810 ml  ? ?Filed Weights  ? 04/26/21 0038  ?Weight: 113.9 kg  ? ? ?Examination: ? ?General exam: Appears calm and comfortable  ?Respiratory system: Clear to auscultation. Respiratory effort normal. ?Cardiovascular system: S1 & S2 heard, RRR. No JVD, murmurs, rubs, gallops or clicks.  ?Gastrointestinal system: Abdomen is obese, soft and  nontender. No organomegaly or masses felt. Normal bowel sounds heard. ?Central nervous system: Alert and oriented. No focal neurological deficits. ?Extremities: Symmetric 5 x 5 power. ?Skin: legs are wrapped below the knees ?Psychiatry: Judgement and insight appear normal. Mood & affect appropriate.  ? ? ? ?Data Reviewed: I have personally reviewed following labs and imaging studies ? ?CBC: ?Recent Labs  ?Lab 04/26/21 ?2831 04/27/21 ?5176  ?WBC 10.8* 9.0   ?HGB 10.4* 10.0*  ?HCT 33.8* 31.8*  ?MCV 85.4 83.5  ?PLT 267 248  ? ?Basic Metabolic Panel: ?Recent Labs  ?Lab 04/26/21 ?1607 04/27/21 ?3710 04/28/21 ?0444  ?NA 136 140 139  ?K 3.8 4.2 4.2  ?CL 102 105 104  ?CO2 '29 30 27  '$ ?GLUCOSE 111* 94 102*  ?BUN 29* 22 22  ?CREATININE 1.00 0.84 0.78  ?CALCIUM 10.5* 10.3 10.4*  ?MG 1.6*  --   --   ? ?GFR: ?Estimated Creatinine Clearance: 60.2 mL/min (by C-G formula based on SCr of 0.78 mg/dL). ?Liver Function Tests: ?No results for input(s): AST, ALT, ALKPHOS, BILITOT, PROT, ALBUMIN in the last 168 hours. ?No results for input(s): LIPASE, AMYLASE in the last 168 hours. ?No results for input(s): AMMONIA in the last 168 hours. ?Coagulation Profile: ?No results for input(s): INR, PROTIME in the last 168 hours. ?Cardiac Enzymes: ?Recent Labs  ?Lab 04/26/21 ?6269  ?CKTOTAL 50  ? ?BNP (last 3 results) ?No results for input(s): PROBNP in the last 8760 hours. ?HbA1C: ?No results for input(s): HGBA1C in the last 72 hours. ?CBG: ?No results for input(s): GLUCAP in the last 168 hours. ?Lipid Profile: ?No results for input(s): CHOL, HDL, LDLCALC, TRIG, CHOLHDL, LDLDIRECT in the last 72 hours. ?Thyroid Function Tests: ?No results for input(s): TSH, T4TOTAL, FREET4, T3FREE, THYROIDAB in the last 72 hours. ? ?Anemia Panel: ?No results for input(s): VITAMINB12, FOLATE, FERRITIN, TIBC, IRON, RETICCTPCT in the last 72 hours. ?Urine analysis: ?   ?Component Value Date/Time  ? COLORURINE YELLOW (A) 04/26/2021 0602  ? APPEARANCEUR HAZY (A) 04/26/2021 0602  ? APPEARANCEUR Clear 01/19/2013 1248  ? LABSPEC 1.009 04/26/2021 0602  ? LABSPEC 1.004 01/19/2013 1248  ? PHURINE 6.0 04/26/2021 0602  ? GLUCOSEU NEGATIVE 04/26/2021 0602  ? GLUCOSEU Negative 01/19/2013 1248  ? [place] NEGATIVE 04/26/2021 0602  ? [place] NEGATIVE 04/26/2021 0602  ? BILIRUBINUR Negative 01/19/2013 1248  ? [place] NEGATIVE 04/26/2021 0602  ? [place] NEGATIVE 04/26/2021 0602  ? NITRITE POSITIVE (A) 04/26/2021 0602  ?  LEUKOCYTESUR MODERATE (A) 04/26/2021 0602  ? LEUKOCYTESUR Negative 01/19/2013 1248  ? ?Sepsis Labs: ?'@LABRCNTIP'$ (procalcitonin:4,lacticidven:4) ? ?) ?Recent Results (from the past 240 hour(s))  ?Urine Culture     Status: Abnormal (Preliminary result)  ? Collection Time: 04/26/21  6:32 AM  ? Specimen: Urine, Random  ?Result Value Ref Range Status  ? Specimen Description   Final  ?  URINE, RANDOM ?Performed at [facility], [address] [place], [place] [address] ?  ? Special Requests   Final  ?  NONE ?Performed at [facility], [address], [place], [place] [address] ?  ? Culture (A)  Final  ?  >=100,000 COLONIES/mL GRAM NEGATIVE RODS ?IDENTIFICATION AND SUSCEPTIBILITIES TO FOLLOW ?Performed at [facility], [place] [address] [place], [place] [address] ?  ? Report Status PENDING  Incomplete  ?  ? ? ? ? ? ?Radiology Studies: ?No results found. ? ? ? ? ? ?Scheduled Meds: ? apixaban  5 mg Oral BID  ? DULoxetine  60 mg Oral Daily  ?  feeding supplement  237 mL Oral TID BM  ? furosemide  20 mg Oral BID  ? hydrOXYzine  10 mg Oral BID  ? levothyroxine  125 mcg Oral Q0600  ? metoprolol succinate  12.5 mg Oral QHS  ? montelukast  10 mg Oral QHS  ? [START ON 04/30/2021] multivitamin with minerals  1 tablet Oral Daily  ? multivitamin-lutein  1 capsule Oral Daily  ? nystatin   Topical BID  ? vitamin B-12  500 mcg Oral Daily  ? ?Continuous Infusions: ? ? ? ? LOS: 3 days  ? ? ?Time spent: 20 min ? ? ? ?Desma Maxim, MD ?[facility] ? ? ?If 7PM-7AM, please contact night-coverage ?[email or web address] ?Password [email or web address] ?04/29/2021, 2:17 PM  ?  ? ?

## 2021-04-29 NOTE — TOC Progression Note (Signed)
Transition of Care (TOC) - Progression Note  ? ? ?Patient Details  ?Name: Kerrie Timm ?MRN: [id number] ?Date of Birth: [birth year] ? ?Transition of Care (TOC) CM/SW Contact  ?Beverly Sessions, RN ?Phone Number: ?04/29/2021, 9:34 AM ? ?Clinical Narrative:    ?[facility] to follow up to see if patient is in her copay days  ? ? ?Expected Discharge Plan: [place] ?Barriers to Discharge: Continued Medical Work up ? ?Expected Discharge Plan and Services ?Expected Discharge Plan: [place] ?  ?  ?  ?Living arrangements for the past 2 months: [place] ?                ?  ?  ?  ?  ?  ?  ?  ?  ?  ?  ? ? ?Social Determinants of Health (SDOH) Interventions ?  ? ?Readmission Risk Interventions ?Readmission Risk Prevention Plan 04/27/2021  ?Post Dischage Appt Complete  ?Medication Screening Complete  ?Transportation Screening Complete  ?Some recent data might be hidden  ? ? ?

## 2021-04-29 NOTE — Progress Notes (Signed)
Mobility Specialist - Progress Note ? ? 04/29/21 1600  ?Mobility  ?Activity Refused mobility  ? ? ? ?2nd attempt this date. Pt politely declined mobility, no reason specified. Pt still in recliner from this AM---offered to assist with transfer to bed but pt states she's comfortable in recliner and would like to sit up a bit longer. Pt aware to use call bell when ready to return to bed. Will attempt session another date.  ? ? ?Kathee Delton ?Mobility Specialist ?04/29/21, 5:01 PM ? ? ? ? ?

## 2021-04-30 ENCOUNTER — Encounter (INDEPENDENT_AMBULATORY_CARE_PROVIDER_SITE_OTHER): Payer: Medicare HMO

## 2021-04-30 ENCOUNTER — Inpatient Hospital Stay: Payer: Medicare HMO

## 2021-04-30 DIAGNOSIS — R2681 Unsteadiness on feet: Secondary | ICD-10-CM | POA: Diagnosis not present

## 2021-04-30 LAB — CBC
HCT: 31.5 % — ABNORMAL LOW (ref 36.0–46.0)
Hemoglobin: 9.9 g/dL — ABNORMAL LOW (ref 12.0–15.0)
MCH: 26.4 pg (ref 26.0–34.0)
MCHC: 31.4 g/dL (ref 30.0–36.0)
MCV: 84 fL (ref 80.0–100.0)
Platelets: 253 10*3/uL (ref 150–400)
RBC: 3.75 MIL/uL — ABNORMAL LOW (ref 3.87–5.11)
RDW: 14.8 % (ref 11.5–15.5)
WBC: 10.4 10*3/uL (ref 4.0–10.5)
nRBC: 0 % (ref 0.0–0.2)

## 2021-04-30 LAB — BASIC METABOLIC PANEL
Anion gap: 8 (ref 5–15)
BUN: 26 mg/dL — ABNORMAL HIGH (ref 8–23)
CO2: 27 mmol/L (ref 22–32)
Calcium: 10.2 mg/dL (ref 8.9–10.3)
Chloride: 101 mmol/L (ref 98–111)
Creatinine, Ser: 0.92 mg/dL (ref 0.44–1.00)
GFR, Estimated: 60 mL/min — ABNORMAL LOW (ref >60)
Glucose, Bld: 102 mg/dL — ABNORMAL HIGH (ref 70–99)
Potassium: 3.8 mmol/L (ref 3.5–5.1)
Sodium: 136 mmol/L (ref 135–145)

## 2021-04-30 LAB — URINE CULTURE: Culture: >=100000 — AB

## 2021-04-30 NOTE — Progress Notes (Signed)
Occupational Therapy Treatment ?Patient Details ?Name: [name] ?MRN: [id number] ?DOB: Jul 24, 1930 ?Today's Date: 04/30/2021 ? ? ?History of present illness Deazia Lampi is a 88 y.o. female with history of obesity, CHF, COPD, hypertension, hypothyroidism, A-fib on Eliquis, chronic kidney disease, hyperparathyroidism with hypercalcemia who presents to the emergency department with her daughter after she slid out of her recliner tonight onto the floor. ?  ?OT comments ? Upon entering the room, pt finishing with PT session and transitions to OT with ease. Pt supine in bed and agreeable to further therapeutic exercise after having returning to bed. Pt performed 3 sets of 10 ankle pumps, straight arm raises, bicep curls, and chest presses with rest breaks as needed. Pt needing cuing for pursed lip breathing with O2 remaining at [facility]. Pt remains in bed with all needs within reach. Pt continues to benefit from OT intervention with recommendation for short term rehab at discharge.   ? ?Recommendations for follow up therapy are one component of a multi-disciplinary discharge planning process, led by the attending physician.  Recommendations may be updated based on patient status, additional functional criteria and insurance authorization. ?   ?Follow Up Recommendations ? Skilled nursing-short term rehab (<3 hours/day)  ?  ?Assistance Recommended at Discharge Frequent or constant Supervision/Assistance  ?Patient can return home with the following ? A lot of help with walking and/or transfers;A lot of help with bathing/dressing/bathroom;Assistance with cooking/housework;Help with stairs or ramp for entrance;Assist for transportation ?  ?Equipment Recommendations ? Other (comment)  ?  ?   ?Precautions / Restrictions Precautions ?Precautions: Fall ?Restrictions ?Weight Bearing Restrictions: No  ? ? ?  ? ?   ?   ? ?ADL either performed or assessed with clinical judgement  ? ?ADL Overall ADL's : Needs assistance/impaired ?  ?   ?  ?  ?  ?  ?  ?  ?  ?  ?  ?  ?  ?  ?  ?  ?  ?  ?  ?  ?  ? ?Extremity/Trunk Assessment Upper Extremity Assessment ?Upper Extremity Assessment: Generalized weakness ?  ?Lower Extremity Assessment ?Lower Extremity Assessment: Generalized weakness ?  ?  ?  ? ?Vision Patient Visual Report: No change from baseline ?  ?  ?   ?   ? ?Cognition Arousal/Alertness: Awake/alert ?Behavior During Therapy: [facility] for tasks assessed/performed ?Overall Cognitive Status: Within Functional Limits for tasks assessed ?  ?  ?  ?  ?  ?  ?  ?  ?  ?  ?  ?  ?  ?  ?  ?  ?General Comments: Pt is pleasant and cooperative throughout ?  ?  ?   ?   ?   ?   ? ? ?Pertinent Vitals/ Pain       Pain Assessment ?Pain Assessment: Faces ?Faces Pain Scale: Hurts a little bit ?Pain Location: Feet/LE's ?Pain Descriptors / Indicators: Tightness, Discomfort ?Pain Intervention(s): Limited activity within patient's tolerance, Monitored during session, Repositioned ? ?   ?   ? ?Frequency ? Min 2X/week  ? ? ? ? ?  ?Progress Toward Goals ? ?OT Goals(current goals can now be found in the care plan section) ? Progress towards OT goals: Progressing toward goals ? ?Acute Rehab OT Goals ?Patient Stated Goal: to get stronger ?OT Goal Formulation: With patient ?Time For Goal Achievement: 05/10/21 ?Potential to Achieve Goals: Good  ?Plan Discharge plan remains appropriate;Frequency remains appropriate   ? ?   ?AM-PAC OT "6 Clicks"  Daily Activity     ?Outcome Measure ? ? Help from another person eating meals?: None ?Help from another person taking care of personal grooming?: A Little ?Help from another person toileting, which includes using toliet, bedpan, or urinal?: A Lot ?Help from another person bathing (including washing, rinsing, drying)?: A Lot ?Help from another person to put on and taking off regular upper body clothing?: A Little ?Help from another person to put on and taking off regular lower body clothing?: A Lot ?6 Click Score: 16 ? ?  ?End of Session   ? ?OT  Visit Diagnosis: Unsteadiness on feet (R26.81);Repeated falls (R29.6);Muscle weakness (generalized) (M62.81) ?  ?Activity Tolerance Patient tolerated treatment well ?  ?Patient Left with call bell/phone within reach;in bed;with bed alarm set ?  ?Nurse Communication   ?  ? ?   ? ?Time: 5329-9242 ?OT Time Calculation (min): 23 min ? ?Charges: OT General Charges ?$OT Visit: 1 Visit ?OT Treatments ?$Therapeutic Exercise: 23-37 mins ? ?Darleen Crocker, [place], OTR/L , CBIS ?ascom [phone]  ?04/30/21, 2:59 PM  ?

## 2021-04-30 NOTE — TOC Progression Note (Signed)
Transition of Care (TOC) - Progression Note  ? ? ?Patient Details  ?Name: [name] ?MRN: [id number] ?Date of Birth: 1930-11-13 ? ?Transition of Care (TOC) CM/SW Contact  ?Alberteen Sam, LCSW ?Phone Number: ?04/30/2021, 4:02 PM ? ?Clinical Narrative:    ? ?Per Otila Kluver at [facility], [employer] is not approved yet. Insurance requested more information which [facility] has sent to [facility].  ? ?Insurance auth continues to pend at this time.  ? ?Expected Discharge Plan: [place] ?Barriers to Discharge: Continued Medical Work up ? ?Expected Discharge Plan and Services ?Expected Discharge Plan: [place] ?  ?  ?  ?Living arrangements for the past 2 months: [place] ?                ?  ?  ?  ?  ?  ?  ?  ?  ?  ?  ? ? ?Social Determinants of Health (SDOH) Interventions ?  ? ?Readmission Risk Interventions ?Readmission Risk Prevention Plan 04/27/2021  ?Post Dischage Appt Complete  ?Medication Screening Complete  ?Transportation Screening Complete  ?Some recent data might be hidden  ? ? ?

## 2021-04-30 NOTE — Plan of Care (Signed)
Pt agreeable to CPAP use overnight but only tolerated the mask for 1 hr stating she cannot use the CPAP. No acute events overnight.  ? ? ?

## 2021-04-30 NOTE — Progress Notes (Signed)
?PROGRESS NOTE ? ? ? ?[name]  DPO:[id number] DOB: 1930/06/27 DOA: 04/26/2021 ?PCP: Sallee Lange, NP  ?Outpatient Specialists: cardiology, pulmonology, nephrology ? ? ? ?Brief Narrative:  ? ?[name] is a 88 y.o. female with medical history significant for hypertension, hypothyroidism, chronic venous stasis, obstructive sleep apnea, GERD, COPD, CHF who presents to the ER via EMS for evaluation after she fell at home. ?Patient's daughter states that she has had multiple falls at home and the evening prior to her admission she slid out of her recliner and was stuck on the floor for about 2 hours before she was able to call for help. ?She was recently in a subacute rehab following hospitalization and was discharged home.  Per daughter they had recommended that she should not be home by herself due to increased risk for falls. ?Patient complains of feeling very weak but denies having any nausea, no vomiting, no changes in her bowel habits, no fever, no chills, no headache, no abdominal pain, no dizziness or lightheadedness. ? ? ?Assessment & Plan: ?  ?Principal Problem: ?  Gait instability ?Active Problems: ?  Hypothyroidism ?  CKD (chronic kidney disease) stage 3, GFR 30-59 ml/min (HCC) ?  Atrial fibrillation, chronic ([place]) ?  Depression ?  Morbid obesity with BMI of 45.0-49.9, adult ([place]) ?  Stasis dermatitis of both legs ?  COPD (chronic obstructive pulmonary disease) ([place]) ?  Frequent falls ? ?# Frequent falls ?# Debility ?CT head neg. Likely consequence of multiple underlying comorbidities. PT advising SNF. Will also need long-term placement. ?- TOC consulted, working on SNF ? ?# Asymptomatic bacteriuria ?culture growing GNRs. Remains asymptoamtic ?- hold on abx, monitor for symptoms ? ?# COPD/Asthma ?Wheeze today ?- duoneb prn ?- check cxr (family concerned about infection, but low suspicion for such) ?- home singulair ? ?# Lymphedema ?Wound nurse consulted, wrapped in unna boots ? ?# Morbid  obesity ?Complicates care ? ?# paroxysmal a fib ?Rate controlled ?- cont home metoprolol, apixaban ? ?# HFpEF ?Quiescent ?- cont home lasix ? ?# Chronic pain ?- home tramadol, duloxetine ? ?# Hypothyroidism ?TFTs show over-treatment ?- reduced dose home levothyroxine from 175 to 125 ?- repeat TFTs 6 wks ? ?# CKD 3a ?At baseline ?- monitor ? ?# OSA ?- cpap qhs ordered ? ?  ? ? ?DVT prophylaxis: home apixaban ?Code Status: DNR ?Family Communication: daughter updated @ bedside 3/7 ? ?Level of care: Med-Surg ?Status is: Inpatient ?Remains inpatient appropriate because: unsafe discharge plan ? ? ? ? ?Consultants:  ?none ? ?Procedures: ?none ? ?Antimicrobials:  ?S/p ceftriaxone  ? ? ?Subjective: ?Feels improved, no pain, tolerating diet. Legs uncomfortable from compression bandages ? ?Objective: ?Vitals:  ? 04/29/21 2148 04/30/21 0524 04/30/21 0740 04/30/21 1530  ?BP:  123/62 139/64 (!) 132/46  ?Pulse:  66 60 76  ?Resp:  (!) '22 19 19  '$ ?Temp:  98.3 ?F (36.8 ?C) 98 ?F (36.7 ?C) (!) 97.4 ?F (36.3 ?C)  ?TempSrc:  Oral    ?SpO2: 96% 97% 97% 97%  ?Weight:      ?Height:      ? ? ?Intake/Output Summary (Last 24 hours) at 04/30/2021 1546 ?Last data filed at 04/30/2021 1433 ?Gross per 24 hour  ?Intake 960 ml  ?Output 800 ml  ?Net 160 ml  ? ?Filed Weights  ? 04/26/21 0038  ?Weight: 113.9 kg  ? ? ?Examination: ? ?General exam: Appears calm and comfortable  ?Respiratory system: few exp wheeze. Respiratory effort normal. ?Cardiovascular system: S1 & S2  heard, RRR. No JVD, murmurs, rubs, gallops or clicks.  ?Gastrointestinal system: Abdomen is obese, soft and nontender. No organomegaly or masses felt. Normal bowel sounds heard. ?Central nervous system: Alert and oriented. No focal neurological deficits. ?Extremities: Symmetric 5 x 5 power. ?Skin: legs are wrapped below the knees ?Psychiatry: Judgement and insight appear normal. Mood & affect appropriate.  ? ? ? ?Data Reviewed: I have personally reviewed following labs and imaging  studies ? ?CBC: ?Recent Labs  ?Lab 04/26/21 ?0347 04/27/21 ?4259 04/30/21 ?0350  ?WBC 10.8* 9.0 10.4  ?HGB 10.4* 10.0* 9.9*  ?HCT 33.8* 31.8* 31.5*  ?MCV 85.4 83.5 84.0  ?PLT 267 248 253  ? ?Basic Metabolic Panel: ?Recent Labs  ?Lab 04/26/21 ?5638 04/27/21 ?7564 04/28/21 ?3329 04/30/21 ?0350  ?NA 136 140 139 136  ?K 3.8 4.2 4.2 3.8  ?CL 102 105 104 101  ?CO2 '29 30 27 27  '$ ?GLUCOSE 111* 94 102* 102*  ?BUN 29* 22 22 26*  ?CREATININE 1.00 0.84 0.78 0.92  ?CALCIUM 10.5* 10.3 10.4* 10.2  ?MG 1.6*  --   --   --   ? ?GFR: ?Estimated Creatinine Clearance: 52.3 mL/min (by C-G formula based on SCr of 0.92 mg/dL). ?Liver Function Tests: ?No results for input(s): AST, ALT, ALKPHOS, BILITOT, PROT, ALBUMIN in the last 168 hours. ?No results for input(s): LIPASE, AMYLASE in the last 168 hours. ?No results for input(s): AMMONIA in the last 168 hours. ?Coagulation Profile: ?No results for input(s): INR, PROTIME in the last 168 hours. ?Cardiac Enzymes: ?Recent Labs  ?Lab 04/26/21 ?5188  ?CKTOTAL 50  ? ?BNP (last 3 results) ?No results for input(s): PROBNP in the last 8760 hours. ?HbA1C: ?No results for input(s): HGBA1C in the last 72 hours. ?CBG: ?No results for input(s): GLUCAP in the last 168 hours. ?Lipid Profile: ?No results for input(s): CHOL, HDL, LDLCALC, TRIG, CHOLHDL, LDLDIRECT in the last 72 hours. ?Thyroid Function Tests: ?No results for input(s): TSH, T4TOTAL, FREET4, T3FREE, THYROIDAB in the last 72 hours. ? ?Anemia Panel: ?No results for input(s): VITAMINB12, FOLATE, FERRITIN, TIBC, IRON, RETICCTPCT in the last 72 hours. ?Urine analysis: ?   ?Component Value Date/Time  ? COLORURINE YELLOW (A) 04/26/2021 0602  ? APPEARANCEUR HAZY (A) 04/26/2021 0602  ? APPEARANCEUR Clear 01/19/2013 1248  ? LABSPEC 1.009 04/26/2021 0602  ? LABSPEC 1.004 01/19/2013 1248  ? PHURINE 6.0 04/26/2021 0602  ? GLUCOSEU NEGATIVE 04/26/2021 0602  ? GLUCOSEU Negative 01/19/2013 1248  ? [place] NEGATIVE 04/26/2021 0602  ? [place] NEGATIVE 04/26/2021  0602  ? BILIRUBINUR Negative 01/19/2013 1248  ? [place] NEGATIVE 04/26/2021 0602  ? [place] NEGATIVE 04/26/2021 0602  ? NITRITE POSITIVE (A) 04/26/2021 0602  ? LEUKOCYTESUR MODERATE (A) 04/26/2021 0602  ? LEUKOCYTESUR Negative 01/19/2013 1248  ? ?Sepsis Labs: ?'@LABRCNTIP'$ (procalcitonin:4,lacticidven:4) ? ?) ?Recent Results (from the past 240 hour(s))  ?Urine Culture     Status: Abnormal  ? Collection Time: 04/26/21  6:32 AM  ? Specimen: Urine, Random  ?Result Value Ref Range Status  ? Specimen Description   Final  ?  URINE, RANDOM ?Performed at [facility] [address], [place], [place] [address] ?  ? Special Requests   Final  ?  NONE ?Performed at [facility], [address], [place],  [address] ?  ? Culture >=100,000 COLONIES/mL ENTEROBACTER CLOACAE (A)  Final  ? Report Status 04/30/2021 FINAL  Final  ? Organism ID, Bacteria ENTEROBACTER CLOACAE (A)  Final  ?    Susceptibility  ? Enterobacter cloacae - MIC*  ?  CEFAZOLIN >=64 RESISTANT Resistant   ?  CEFEPIME 0.5 SENSITIVE Sensitive   ?  CIPROFLOXACIN <=0.25 SENSITIVE Sensitive   ?  GENTAMICIN <=1 SENSITIVE Sensitive   ?  IMIPENEM <=0.25 SENSITIVE Sensitive   ?  NITROFURANTOIN 128 RESISTANT Resistant   ?  TRIMETH/SULFA 40 SENSITIVE Sensitive   ?  PIP/TAZO 16 SENSITIVE Sensitive   ?  * >=100,000 COLONIES/mL ENTEROBACTER CLOACAE  ?  ? ? ? ? ? ?Radiology Studies: ?No results found. ? ? ? ? ? ?Scheduled Meds: ? apixaban  5 mg Oral BID  ? DULoxetine  60 mg Oral Daily  ? feeding supplement  237 mL Oral TID BM  ? furosemide  20 mg Oral BID  ? hydrOXYzine  10 mg Oral BID  ? levothyroxine  125 mcg Oral Q0600  ? metoprolol succinate  12.5 mg Oral QHS  ? montelukast  10 mg Oral QHS  ? multivitamin with minerals  1 tablet Oral Daily  ? multivitamin-lutein  1 capsule Oral Daily  ? nystatin   Topical BID  ? vitamin B-12  500 mcg Oral Daily  ? ?Continuous Infusions: ? ? ? ? LOS: 4 days  ? ? ?Time spent: 20 min ? ? ? ?Desma Maxim, MD ?[facility] ? ? ?If 7PM-7AM, please contact night-coverage ?[email or web address] ?Password [email or web address] ?04/30/2021, 3:46 PM  ?  ? ?

## 2021-04-30 NOTE — TOC Progression Note (Signed)
Transition of Care (TOC) - Progression Note  ? ? ?Patient Details  ?Name: [name] ?MRN: [id number] ?Date of Birth: 10-03-1930 ? ?Transition of Care (TOC) CM/SW Contact  ?Beverly Sessions, RN ?Phone Number: ?04/30/2021, 9:07 AM ? ?Clinical Narrative:    ?SNF auth pending  ? ? ?Expected Discharge Plan: [place] ?Barriers to Discharge: Continued Medical Work up ? ?Expected Discharge Plan and Services ?Expected Discharge Plan: [place] ?  ?  ?  ?Living arrangements for the past 2 months: [place] ?                ?  ?  ?  ?  ?  ?  ?  ?  ?  ?  ? ? ?Social Determinants of Health (SDOH) Interventions ?  ? ?Readmission Risk Interventions ?Readmission Risk Prevention Plan 04/27/2021  ?Post Dischage Appt Complete  ?Medication Screening Complete  ?Transportation Screening Complete  ?Some recent data might be hidden  ? ? ?

## 2021-04-30 NOTE — Progress Notes (Signed)
Physical Therapy Treatment ?Patient Details ?Name: [name] ?MRN: [id number] ?DOB: [birth year] ?Today's Date: 04/30/2021 ? ? ?History of Present Illness Martena Emanuele is a 88 y.o. female with history of obesity, CHF, COPD, hypertension, hypothyroidism, A-fib on Eliquis, chronic kidney disease, hyperparathyroidism with hypercalcemia who presents to the emergency department with her daughter after she slid out of her recliner tonight onto the floor. ? ?  ?PT Comments  ? ? Pt received in bed agreeable to PT services. Pt remains supervision with supine to sit and modA  to return to supine for LE management. Only able to tolerate 10' of ambulation today at a decreased gait speed from previous session. Remains stable throughout with RW however. Education and performance of LE exercises due to noted worsening of weakness and tolerance for OOB mobility as acute stay lengthens. New onset of green phlegm noted that pt is coughing up with worsening of wheezing with functional mobility. HR and SPO2 WNLF throughout and RN made aware. Pt left in hands of OT upright in bed. D/c recs remains appropriate due to deficits in tolerance for OOB mobility, poor endurance and LE weakness. ?   ?Recommendations for follow up therapy are one component of a multi-disciplinary discharge planning process, led by the attending physician.  Recommendations may be updated based on patient status, additional functional criteria and insurance authorization. ? ?Follow Up Recommendations ? Skilled nursing-short term rehab (<3 hours/day) ?  ?  ?Assistance Recommended at Discharge Intermittent Supervision/Assistance  ?Patient can return home with the following A little help with walking and/or transfers;A little help with bathing/dressing/bathroom;Assist for transportation;Help with stairs or ramp for entrance ?  ?Equipment Recommendations ? Other (comment)  ?  ?Recommendations for Other Services   ? ? ?  ?Precautions / Restrictions  Precautions ?Precautions: Fall ?Restrictions ?Weight Bearing Restrictions: No  ?  ? ?Mobility ? Bed Mobility ?Overal bed mobility: Needs Assistance ?Bed Mobility: Supine to Sit, Sit to Supine ?  ?  ?Supine to sit: Supervision, HOB elevated ?Sit to supine: Supervision ?  ?General bed mobility comments: modA for LE's to return to supine in bed. ?Patient Response: Cooperative ? ?Transfers ?Overall transfer level: Needs assistance ?Equipment used: Rolling walker (2 wheels) ?Transfers: Sit to/from Stand ?Sit to Stand: Min guard, From elevated surface ?  ?  ?  ?  ?  ?General transfer comment: minguard with bed elevated. Safe hand placement. ?  ? ?Ambulation/Gait ?Ambulation/Gait assistance: Min guard ?Gait Distance (Feet): 10 Feet ?Assistive device: Rolling walker (2 wheels) ?Gait Pattern/deviations: Step-to pattern, Trunk flexed, Wide base of support ?Gait velocity: Decreased ?  ?  ?General Gait Details: Progressively tolerating shorter bouts of gait per session. ? ? ?Stairs ?  ?  ?  ?  ?  ? ? ?Wheelchair Mobility ?  ? ?Modified Rankin (Stroke Patients Only) ?  ? ? ?  ?Balance Overall balance assessment: Needs assistance ?Sitting-balance support: Bilateral upper extremity supported, Feet supported ?Sitting balance-Leahy Scale: Fair ?  ?  ?Standing balance support: Bilateral upper extremity supported, During functional activity, Reliant on assistive device for balance ?Standing balance-Leahy Scale: Poor ?Standing balance comment: Reliant on RW for support ?  ?  ?  ?  ?  ?  ?  ?  ?  ?  ?  ?  ? ?  ?Cognition Arousal/Alertness: Awake/alert ?Behavior During Therapy: [facility] for tasks assessed/performed ?Overall Cognitive Status: Within Functional Limits for tasks assessed ?  ?  ?  ?  ?  ?  ?  ?  ?  ?  ?  ?  ?  ?  ?  ?  ?  ?  ?  ? ?  ?  Exercises General Exercises - Lower Extremity ?Ankle Circles/Pumps: AROM, Strengthening, Both, 20 reps, Supine ?Long Arc Quad: AROM, Strengthening, Both, 20 reps, Seated ?Hip ABduction/ADduction:  AROM, Both, 15 reps, Supine ?Straight Leg Raises: AROM, Strengthening, Both, 15 reps, Supine ?Hip Flexion/Marching: AROM, Strengthening, Both, 20 reps, Seated ? ?  ?General Comments   ?  ?  ? ?Pertinent Vitals/Pain Pain Assessment ?Pain Assessment: Faces ?Faces Pain Scale: Hurts a little bit ?Pain Location: Feet/LE's ?Pain Descriptors / Indicators: Tightness, Discomfort ?Pain Intervention(s): Limited activity within patient's tolerance, Monitored during session, Repositioned  ? ? ?Home Living   ?  ?  ?  ?  ?  ?  ?  ?  ?  ?   ?  ?Prior Function    ?  ?  ?   ? ?PT Goals (current goals can now be found in the care plan section) Acute Rehab PT Goals ?Patient Stated Goal: to prevent future falls, get into assisted living ?PT Goal Formulation: With patient ?Time For Goal Achievement: 05/10/21 ?Potential to Achieve Goals: Good ?Progress towards PT goals: Progressing toward goals ? ?  ?Frequency ? ? ? Min 2X/week ? ? ? ?  ?PT Plan Current plan remains appropriate  ? ? ?Co-evaluation   ?  ?  ?  ?  ? ?  ?AM-PAC PT "6 Clicks" Mobility   ?Outcome Measure ? Help needed turning from your back to your side while in a flat bed without using bedrails?: A Lot ?Help needed moving from lying on your back to sitting on the side of a flat bed without using bedrails?: A Lot ?Help needed moving to and from a bed to a chair (including a wheelchair)?: A Lot ?Help needed standing up from a chair using your arms (e.g., wheelchair or bedside chair)?: A Little ?Help needed to walk in hospital room?: A Lot ?Help needed climbing 3-5 steps with a railing? : A Lot ?6 Click Score: 13 ? ?  ?End of Session Equipment Utilized During Treatment: Gait belt ?Activity Tolerance: Patient limited by fatigue ?Patient left: in bed;with call bell/phone within reach;with bed alarm set;Other (comment) (OT beginning session at bedside) ?Nurse Communication: Mobility status;Other (comment) (new onset of coughing up green colored phlegm) ?PT Visit Diagnosis: Other  abnormalities of gait and mobility (R26.89);Repeated falls (R29.6);Muscle weakness (generalized) (M62.81) ?  ? ? ?Time: 7001-7494 ?PT Time Calculation (min) (ACUTE ONLY): 24 min ? ?Charges:  $Gait Training: 8-22 mins ?$Therapeutic Exercise: 8-22 mins          ?          ? ?Salem Caster. Fairly IV, PT, DPT ?Physical Therapist- [place]  ?[facility]  ?04/30/2021, 3:25 PM ? ?

## 2021-05-01 DIAGNOSIS — R300 Dysuria: Secondary | ICD-10-CM | POA: Diagnosis not present

## 2021-05-01 DIAGNOSIS — I5030 Unspecified diastolic (congestive) heart failure: Secondary | ICD-10-CM | POA: Diagnosis not present

## 2021-05-01 DIAGNOSIS — E039 Hypothyroidism, unspecified: Secondary | ICD-10-CM | POA: Diagnosis not present

## 2021-05-01 DIAGNOSIS — I4891 Unspecified atrial fibrillation: Secondary | ICD-10-CM | POA: Diagnosis not present

## 2021-05-01 DIAGNOSIS — I959 Hypotension, unspecified: Secondary | ICD-10-CM | POA: Diagnosis not present

## 2021-05-01 DIAGNOSIS — G4733 Obstructive sleep apnea (adult) (pediatric): Secondary | ICD-10-CM | POA: Diagnosis not present

## 2021-05-01 DIAGNOSIS — B3731 Acute candidiasis of vulva and vagina: Secondary | ICD-10-CM | POA: Diagnosis not present

## 2021-05-01 DIAGNOSIS — Z7901 Long term (current) use of anticoagulants: Secondary | ICD-10-CM | POA: Diagnosis not present

## 2021-05-01 DIAGNOSIS — R296 Repeated falls: Secondary | ICD-10-CM | POA: Diagnosis not present

## 2021-05-01 DIAGNOSIS — L299 Pruritus, unspecified: Secondary | ICD-10-CM | POA: Diagnosis not present

## 2021-05-01 DIAGNOSIS — E559 Vitamin D deficiency, unspecified: Secondary | ICD-10-CM | POA: Diagnosis not present

## 2021-05-01 DIAGNOSIS — I2510 Atherosclerotic heart disease of native coronary artery without angina pectoris: Secondary | ICD-10-CM | POA: Diagnosis not present

## 2021-05-01 DIAGNOSIS — I10 Essential (primary) hypertension: Secondary | ICD-10-CM | POA: Diagnosis not present

## 2021-05-01 DIAGNOSIS — K625 Hemorrhage of anus and rectum: Secondary | ICD-10-CM | POA: Diagnosis not present

## 2021-05-01 DIAGNOSIS — R2681 Unsteadiness on feet: Secondary | ICD-10-CM | POA: Diagnosis not present

## 2021-05-01 DIAGNOSIS — K5900 Constipation, unspecified: Secondary | ICD-10-CM | POA: Diagnosis not present

## 2021-05-01 DIAGNOSIS — N390 Urinary tract infection, site not specified: Secondary | ICD-10-CM | POA: Diagnosis not present

## 2021-05-01 DIAGNOSIS — J449 Chronic obstructive pulmonary disease, unspecified: Secondary | ICD-10-CM | POA: Diagnosis not present

## 2021-05-01 DIAGNOSIS — M6281 Muscle weakness (generalized): Secondary | ICD-10-CM | POA: Diagnosis not present

## 2021-05-01 DIAGNOSIS — Z7401 Bed confinement status: Secondary | ICD-10-CM | POA: Diagnosis not present

## 2021-05-01 LAB — BASIC METABOLIC PANEL
Anion gap: 6 (ref 5–15)
BUN: 26 mg/dL — ABNORMAL HIGH (ref 8–23)
CO2: 27 mmol/L (ref 22–32)
Calcium: 10.3 mg/dL (ref 8.9–10.3)
Chloride: 104 mmol/L (ref 98–111)
Creatinine, Ser: 0.99 mg/dL (ref 0.44–1.00)
GFR, Estimated: 55 mL/min — ABNORMAL LOW (ref >60)
Glucose, Bld: 143 mg/dL — ABNORMAL HIGH (ref 70–99)
Potassium: 4.1 mmol/L (ref 3.5–5.1)
Sodium: 137 mmol/L (ref 135–145)

## 2021-05-01 MED ORDER — TRAMADOL HCL 50 MG PO TABS: 50.0000 mg | ORAL_TABLET | Freq: Four times a day (QID) | ORAL | 0 refills | Status: AC | PRN

## 2021-05-01 NOTE — TOC Progression Note (Signed)
Transition of Care (TOC) - Progression Note  ? ? ?Patient Details  ?Name: [name] ?MRN: [id number] ?Date of Birth: [birth year] ? ?Transition of Care (TOC) CM/SW Contact  ?Beverly Sessions, RN ?Phone Number: ?05/01/2021, 9:23 AM ? ?Clinical Narrative:    ? ?Reached out to Tammy at [facility] for update on Auth  ? ?Expected Discharge Plan: [place] ?Barriers to Discharge: Continued Medical Work up ? ?Expected Discharge Plan and Services ?Expected Discharge Plan: [place] ?  ?  ?  ?Living arrangements for the past 2 months: [place] ?                ?  ?  ?  ?  ?  ?  ?  ?  ?  ?  ? ? ?Social Determinants of Health (SDOH) Interventions ?  ? ?Readmission Risk Interventions ?Readmission Risk Prevention Plan 04/27/2021  ?Post Dischage Appt Complete  ?Medication Screening Complete  ?Transportation Screening Complete  ?Some recent data might be hidden  ? ? ?

## 2021-05-01 NOTE — Discharge Summary (Addendum)
Physician Discharge Summary  [name] JJH:[id number] DOB: December 12, 1930 DOA: 04/26/2021  PCP: Sallee Lange, NP  Admit date: 04/26/2021 Discharge date: 05/01/2021  Admitted From: Home Disposition:  SNF  Recommendations for Outpatient Follow-up:  Follow up with PCP in 1-2 weeks   Home Health:No Equipment/Devices:None   Discharge Condition:Stable  CODE STATUS:DNR  Diet recommendation: Heart healthy/low sodium  Brief/Interim Summary: [name] is a 88 y.o. female with medical history significant for hypertension, hypothyroidism, chronic venous stasis, obstructive sleep apnea, GERD, COPD, CHF who presents to the ER via EMS for evaluation after she fell at home. Patient's daughter states that she has had multiple falls at home and the evening prior to her admission she slid out of her recliner and was stuck on the floor for about 2 hours before she was able to call for help. She was recently in a subacute rehab following hospitalization and was discharged home.  Per daughter they had recommended that she should not be home by herself due to increased risk for falls. Patient complains of feeling very weak but denies having any nausea, no vomiting, no changes in her bowel habits, no fever, no chills, no headache, no abdominal pain, no dizziness or lightheadedness.  Stabilized from an inpatient standpoint.  No clear evidence of infection.  No antibiotics indicated on discharge.  Insurance authorization obtained and [facility]    Discharge Diagnoses:  Principal Problem:   Gait instability Active Problems:   Hypothyroidism   CKD (chronic kidney disease) stage 3, GFR 30-59 ml/min (HCC)   Atrial fibrillation, chronic (HCC)   Depression   Morbid obesity with BMI of 45.0-49.9, adult (HCC)   Stasis dermatitis of both legs   COPD (chronic obstructive pulmonary disease) (HCC)   Frequent falls  # Frequent falls # Debility CT head neg. Likely  consequence of multiple underlying comorbidities. PT advising SNF. Will also need long-term placement. - TOC consulted, insurance authorization obtained for skilled nursing facility   # Asymptomatic bacteriuria culture growing GNRs. Remains asymptoamtic -Suspect asymptomatic bacteriuria.  No indication for antibiotics at time of discharge   # COPD/Asthma No wheezing noted on day of discharge.  Chest x-ray clear.  No evidence of infection.  Can continue home Singulair and inhaler regimen on discharge.   # Lymphedema Wound nurse consulted, wrapped in unna boots   # Morbid obesity Complicates care   # paroxysmal a fib Rate controlled - cont home metoprolol, apixaban   # HFpEF Quiescent - cont home lasix   # Chronic pain - home tramadol, duloxetine   # Hypothyroidism TFTs show over-treatment - reduced dose home levothyroxine from 175 to 125 - repeat TFTs 6 wks   # CKD 3a At baseline - monitor   # OSA - Not using qHS CPAP during hospitalization - Unclear home regimen - Recommend outpatient followup with PCP or pulmonary to further discuss  Discharge Instructions  Discharge Instructions     Diet - low sodium heart healthy   Complete by: As directed    Increase activity slowly   Complete by: As directed       Allergies as of 05/01/2021       Reactions   Prednisone Other (See Comments)   Irritability   Aspirin Nausea Only   Sulfa Antibiotics Rash   Sulfacetamide Sodium Rash        Medication List     STOP taking these medications    doxycycline 100 MG capsule Commonly known as:  VIBRAMYCIN       TAKE these medications    acetaminophen 500 MG tablet Commonly known as: TYLENOL Take 500-1,000 mg by mouth every 6 (six) hours as needed for mild pain or moderate pain.   apixaban 5 MG Tabs tablet Commonly known as: ELIQUIS Take 5 mg by mouth 2 (two) times daily.   DULoxetine 60 MG capsule Commonly known as: CYMBALTA Take 60 mg by mouth daily.    furosemide 20 MG tablet Commonly known as: LASIX Take 20 mg by mouth 2 (two) times daily.   hydrOXYzine 10 MG tablet Commonly known as: ATARAX Take 10 mg by mouth 2 (two) times daily.   ipratropium-albuterol 0.5-2.5 (3) MG/3ML Soln Commonly known as: DUONEB Take 3 mLs by nebulization every 6 (six) hours as needed (shortness of breath).   levothyroxine 175 MCG tablet Commonly known as: SYNTHROID Take 175 mcg by mouth daily.   metoprolol succinate 25 MG 24 hr tablet Commonly known as: TOPROL-XL Take 0.5 tablets (12.5 mg total) by mouth at bedtime.   montelukast 10 MG tablet Commonly known as: SINGULAIR Take 10 mg by mouth daily.   PreserVision AREDS 2+Multi Vit Caps Take 1 capsule by mouth as directed.   traMADol 50 MG tablet Commonly known as: ULTRAM Take 1 tablet (50 mg total) by mouth every 6 (six) hours as needed for up to 3 days. SNF use only What changed: additional instructions   traZODone 50 MG tablet Commonly known as: DESYREL Take 1 tablet (50 mg total) by mouth at bedtime as needed for sleep.   triamcinolone cream 0.1 % Commonly known as: KENALOG Apply 1 application topically 2 (two) times daily as needed.   Ventolin HFA 108 (90 Base) MCG/ACT inhaler Generic drug: albuterol Inhale 2 puffs into the lungs every 6 (six) hours as needed for wheezing or shortness of breath.   vitamin B-12 500 MCG tablet Commonly known as: CYANOCOBALAMIN Take 500 mcg by mouth daily.        Contact information for after-discharge care     Destination     [facility] Preferred SNF .   Service: Skilled Nursing Contact information: [place] [address] [phone]                    Allergies  Allergen Reactions   Prednisone Other (See Comments)    Irritability   Aspirin Nausea Only   Sulfa Antibiotics Rash   Sulfacetamide Sodium Rash    Consultations: None   Procedures/Studies: DG Chest 2  View  Result Date: 04/26/2021 CLINICAL DATA:  88 year old female with fall from chair, found down. Shortness of breath. EXAM: CHEST - 2 VIEW COMPARISON:  Portable chest 02/25/2021 and earlier. FINDINGS: Stable lung volumes and mediastinal contours, cardiac size within normal limits. Visualized tracheal air column is within normal limits. No pneumothorax, pulmonary edema, pleural effusion or confluent pulmonary opacity. Exaggerated thoracic kyphosis. No acute osseous abnormality identified. Negative visible bowel gas. IMPRESSION: No acute cardiopulmonary abnormality or acute traumatic injury identified. Electronically Signed   By: Genevie Ann M.D.   On: 04/26/2021 06:46   CT HEAD WO CONTRAST (5MM)  Result Date: 04/26/2021 CLINICAL DATA:  Patient fell out of a chair. EXAM: CT HEAD WITHOUT CONTRAST CT CERVICAL SPINE WITHOUT CONTRAST TECHNIQUE: Multidetector CT imaging of the head and cervical spine was performed following the standard protocol without intravenous contrast. Multiplanar CT image reconstructions of the cervical spine were also generated. RADIATION DOSE REDUCTION: This exam was  performed according to the departmental dose-optimization program which includes automated exposure control, adjustment of the mA and/or kV according to patient size and/or use of iterative reconstruction technique. COMPARISON:  01/31/2021 FINDINGS: CT HEAD FINDINGS Brain: There is no evidence for acute hemorrhage, hydrocephalus, mass lesion, or abnormal extra-axial fluid collection. No definite CT evidence for acute infarction. Diffuse loss of parenchymal volume is consistent with atrophy. Patchy low attenuation in the deep hemispheric and periventricular white matter is nonspecific, but likely reflects chronic microvascular ischemic demyelination. Vascular: No hyperdense vessel or unexpected calcification. Skull: No evidence for fracture. No worrisome lytic or sclerotic lesion. Sinuses/Orbits: The visualized paranasal sinuses and  mastoid air cells are clear. Visualized portions of the globes and intraorbital fat are unremarkable. Other: None. CT CERVICAL SPINE FINDINGS Alignment: Normal. Skull base and vertebrae: No acute fracture. No primary bone lesion or focal pathologic process. Soft tissues and spinal canal: No prevertebral fluid or swelling. No visible canal hematoma. Disc levels: More loss of disc height with endplate degeneration noted C5-6 and C6-7. Diffuse facet osteoarthritis bilaterally. Upper chest: Negative. Other: None. IMPRESSION: 1. No acute intracranial abnormality. 2. Atrophy with chronic small vessel ischemic disease. 3. No cervical fracture or subluxation. 4. Degenerative changes in the cervical spine as above. Electronically Signed   By: Misty Stanley M.D.   On: 04/26/2021 06:17   CT Cervical Spine Wo Contrast  Result Date: 04/26/2021 CLINICAL DATA:  Patient fell out of a chair. EXAM: CT HEAD WITHOUT CONTRAST CT CERVICAL SPINE WITHOUT CONTRAST TECHNIQUE: Multidetector CT imaging of the head and cervical spine was performed following the standard protocol without intravenous contrast. Multiplanar CT image reconstructions of the cervical spine were also generated. RADIATION DOSE REDUCTION: This exam was performed according to the departmental dose-optimization program which includes automated exposure control, adjustment of the mA and/or kV according to patient size and/or use of iterative reconstruction technique. COMPARISON:  01/31/2021 FINDINGS: CT HEAD FINDINGS Brain: There is no evidence for acute hemorrhage, hydrocephalus, mass lesion, or abnormal extra-axial fluid collection. No definite CT evidence for acute infarction. Diffuse loss of parenchymal volume is consistent with atrophy. Patchy low attenuation in the deep hemispheric and periventricular white matter is nonspecific, but likely reflects chronic microvascular ischemic demyelination. Vascular: No hyperdense vessel or unexpected calcification. Skull: No  evidence for fracture. No worrisome lytic or sclerotic lesion. Sinuses/Orbits: The visualized paranasal sinuses and mastoid air cells are clear. Visualized portions of the globes and intraorbital fat are unremarkable. Other: None. CT CERVICAL SPINE FINDINGS Alignment: Normal. Skull base and vertebrae: No acute fracture. No primary bone lesion or focal pathologic process. Soft tissues and spinal canal: No prevertebral fluid or swelling. No visible canal hematoma. Disc levels: More loss of disc height with endplate degeneration noted C5-6 and C6-7. Diffuse facet osteoarthritis bilaterally. Upper chest: Negative. Other: None. IMPRESSION: 1. No acute intracranial abnormality. 2. Atrophy with chronic small vessel ischemic disease. 3. No cervical fracture or subluxation. 4. Degenerative changes in the cervical spine as above. Electronically Signed   By: Misty Stanley M.D.   On: 04/26/2021 06:17   DG Chest Port 1 View  Result Date: 04/30/2021 CLINICAL DATA:  Wheezing. EXAM: PORTABLE CHEST 1 VIEW COMPARISON:  04/26/2021 FINDINGS: Patient is partially rotated to the right. Heart size is stable. Both lungs are clear. IMPRESSION: No active disease. Electronically Signed   By: Marlaine Hind M.D.   On: 04/30/2021 16:04      Subjective: Seen and examined on day of discharge.  No pain complaints.  Respiratory status at baseline.  Can discharge to skilled nursing facility.  Discharge Exam: Vitals:   05/01/21 0518 05/01/21 0820  BP: (!) 103/53 (!) 177/71  Pulse: 73 69  Resp: 18 18  Temp: 98 F (36.7 C) 98.3 F (36.8 C)  SpO2: 96% 99%   Vitals:   04/30/21 1630 04/30/21 2104 05/01/21 0518 05/01/21 0820  BP:  106/66 (!) 103/53 (!) 177/71  Pulse:  77 73 69  Resp:  '20 18 18  '$ Temp:  98.6 F (37 C) 98 F (36.7 C) 98.3 F (36.8 C)  TempSrc:  Oral Oral Oral  SpO2: 96% 92% 96% 99%  Weight:      Height:        General: Pt is alert, awake, not in acute distress Cardiovascular: Regular rate, irregular rhythm,  no murmurs Respiratory: Diminished breath sounds.  No wheeze.  Normal work of breathing Abdominal: Obese, soft, NT/ND, normal bowel sounds Extremities: Extensive bilateral venous stasis.  Unna boots in place    The results of significant diagnostics from this hospitalization (including imaging, microbiology, ancillary and laboratory) are listed below for reference.     Microbiology: Recent Results (from the past 240 hour(s))  Urine Culture     Status: Abnormal   Collection Time: 04/26/21  6:32 AM   Specimen: Urine, Random  Result Value Ref Range Status   Specimen Description   Final    URINE, RANDOM Performed at [facility], [address] [place], [place] [address]    Special Requests   Final    NONE Performed at [facility], [address] [place], [place] [address]    Culture >=100,000 COLONIES/mL ENTEROBACTER CLOACAE (A)  Final   Report Status 04/30/2021 FINAL  Final   Organism ID, Bacteria ENTEROBACTER CLOACAE (A)  Final      Susceptibility   Enterobacter cloacae - MIC*    CEFAZOLIN >=64 RESISTANT Resistant     CEFEPIME 0.5 SENSITIVE Sensitive     CIPROFLOXACIN <=0.25 SENSITIVE Sensitive     GENTAMICIN <=1 SENSITIVE Sensitive     IMIPENEM <=0.25 SENSITIVE Sensitive     NITROFURANTOIN 128 RESISTANT Resistant     TRIMETH/SULFA 40 SENSITIVE Sensitive     PIP/TAZO 16 SENSITIVE Sensitive     * >=100,000 COLONIES/mL ENTEROBACTER CLOACAE     Labs: BNP (last 3 results) Recent Labs    02/25/21 0334 04/26/21 0042  BNP 152.4* 026.3*   Basic Metabolic Panel: Recent Labs  Lab 04/26/21 0042 04/27/21 0346 04/28/21 0444 04/30/21 0350 05/01/21 0418  NA 136 140 139 136 137  K 3.8 4.2 4.2 3.8 4.1  CL 102 105 104 101 104  CO2 '29 30 27 27 27  '$ GLUCOSE 111* 94 102* 102* 143*  BUN 29* 22 22 26* 26*  CREATININE 1.00 0.84 0.78 0.92 0.99  CALCIUM 10.5* 10.3 10.4* 10.2 10.3  MG 1.6*  --   --   --   --    Liver Function Tests: No results for  input(s): AST, ALT, ALKPHOS, BILITOT, PROT, ALBUMIN in the last 168 hours. No results for input(s): LIPASE, AMYLASE in the last 168 hours. No results for input(s): AMMONIA in the last 168 hours. CBC: Recent Labs  Lab 04/26/21 0042 04/27/21 0346 04/30/21 0350  WBC 10.8* 9.0 10.4  HGB 10.4* 10.0* 9.9*  HCT 33.8* 31.8* 31.5*  MCV 85.4 83.5 84.0  PLT 267 248 253   Cardiac Enzymes: Recent Labs  Lab 04/26/21 0042  CKTOTAL 50   BNP: Invalid  input(s): POCBNP CBG: No results for input(s): GLUCAP in the last 168 hours. D-Dimer No results for input(s): DDIMER in the last 72 hours. Hgb A1c No results for input(s): HGBA1C in the last 72 hours. Lipid Profile No results for input(s): CHOL, HDL, LDLCALC, TRIG, CHOLHDL, LDLDIRECT in the last 72 hours. Thyroid function studies No results for input(s): TSH, T4TOTAL, T3FREE, THYROIDAB in the last 72 hours.  Invalid input(s): FREET3 Anemia work up No results for input(s): VITAMINB12, FOLATE, FERRITIN, TIBC, IRON, RETICCTPCT in the last 72 hours. Urinalysis    Component Value Date/Time   COLORURINE YELLOW (A) 04/26/2021 0602   APPEARANCEUR HAZY (A) 04/26/2021 0602   APPEARANCEUR Clear 01/19/2013 1248   LABSPEC 1.009 04/26/2021 0602   LABSPEC 1.004 01/19/2013 1248   PHURINE 6.0 04/26/2021 0602   GLUCOSEU NEGATIVE 04/26/2021 0602   GLUCOSEU Negative 01/19/2013 1248   HGBUR NEGATIVE 04/26/2021 0602   BILIRUBINUR NEGATIVE 04/26/2021 0602   BILIRUBINUR Negative 01/19/2013 1248   KETONESUR NEGATIVE 04/26/2021 0602   PROTEINUR NEGATIVE 04/26/2021 0602   NITRITE POSITIVE (A) 04/26/2021 0602   LEUKOCYTESUR MODERATE (A) 04/26/2021 0602   LEUKOCYTESUR Negative 01/19/2013 1248   Sepsis Labs Invalid input(s): PROCALCITONIN,  WBC,  LACTICIDVEN Microbiology Recent Results (from the past 240 hour(s))  Urine Culture     Status: Abnormal   Collection Time: 04/26/21  6:32 AM   Specimen: Urine, Random  Result Value Ref Range Status   Specimen  Description   Final    URINE, RANDOM Performed at [facility], [address] [place], [place] [address]    Special Requests   Final    NONE Performed at [facility], [address] [place], [place] [address]    Culture >=100,000 COLONIES/mL ENTEROBACTER CLOACAE (A)  Final   Report Status 04/30/2021 FINAL  Final   Organism ID, Bacteria ENTEROBACTER CLOACAE (A)  Final      Susceptibility   Enterobacter cloacae - MIC*    CEFAZOLIN >=64 RESISTANT Resistant     CEFEPIME 0.5 SENSITIVE Sensitive     CIPROFLOXACIN <=0.25 SENSITIVE Sensitive     GENTAMICIN <=1 SENSITIVE Sensitive     IMIPENEM <=0.25 SENSITIVE Sensitive     NITROFURANTOIN 128 RESISTANT Resistant     TRIMETH/SULFA 40 SENSITIVE Sensitive     PIP/TAZO 16 SENSITIVE Sensitive     * >=100,000 COLONIES/mL ENTEROBACTER CLOACAE     Time coordinating discharge: Over 30 minutes  SIGNED:   Sidney Ace, MD  [facility] 05/01/2021, 10:41 AM Pager   If 7PM-7AM, please contact night-coverage

## 2021-05-01 NOTE — Progress Notes (Signed)
Patient ready for DC. VS WNL BP 135/57  ?HR 65, R 20, T 98 O2 95% ?EMS here to [facility] to [facility]. Patient is aler and oriented.  ?1 IV removed no complications. ?Printed AVS and prescription for tramadol in DC package.  ? ?

## 2021-05-01 NOTE — TOC Transition Note (Signed)
Transition of Care (TOC) - CM/SW Discharge Note ? ? ?Patient Details  ?Name: [name] ?MRN: [id number] ?Date of Birth: 1930/06/21 ? ?Transition of Care (TOC) CM/SW Contact:  ?Beverly Sessions, RN ?Phone Number: ?05/01/2021, 11:35 AM ? ? ?Clinical Narrative:    ?Patient will DC MO:LMBE ?Anticipated DC date: 05/01/21 ? ?Family notified: Daughter  ?Transport ML:[id number] ? ?Per MD patient ready for DC to . RN, patient, patient's family, and facility notified of DC. Discharge Summary sent to facility. RN given number for report. DC packet on chart. Ambulance transport requested for patient.  ?TOC signing off. ? ?Isaias Cowman RNCM ?[phone] ? ? ? ?  ?Barriers to Discharge: Continued Medical Work up ? ? ?Patient Goals and CMS Choice ?Patient states their goals for this hospitalization and ongoing recovery are:: SNF ?CMS Medicare.gov Compare Post Acute Care list provided to:: Patient Represenative (must comment) ?Choice offered to / list presented to : Adult Children ? ?Discharge Placement ?  ?           ?  ?  ?  ?  ? ?Discharge Plan and Services ?  ?  ?           ?  ?  ?  ?  ?  ?  ?  ?  ?  ?  ? ?Social Determinants of Health (SDOH) Interventions ?  ? ? ?Readmission Risk Interventions ?Readmission Risk Prevention Plan 04/27/2021  ?Post Dischage Appt Complete  ?Medication Screening Complete  ?Transportation Screening Complete  ?Some recent data might be hidden  ? ? ? ? ? ?

## 2021-05-02 DIAGNOSIS — I2510 Atherosclerotic heart disease of native coronary artery without angina pectoris: Secondary | ICD-10-CM | POA: Diagnosis not present

## 2021-05-02 DIAGNOSIS — I4891 Unspecified atrial fibrillation: Secondary | ICD-10-CM | POA: Diagnosis not present

## 2021-05-02 DIAGNOSIS — M6281 Muscle weakness (generalized): Secondary | ICD-10-CM | POA: Diagnosis not present

## 2021-05-02 DIAGNOSIS — J449 Chronic obstructive pulmonary disease, unspecified: Secondary | ICD-10-CM | POA: Diagnosis not present

## 2021-05-03 ENCOUNTER — Encounter (INDEPENDENT_AMBULATORY_CARE_PROVIDER_SITE_OTHER): Payer: Medicare HMO

## 2021-05-03 DIAGNOSIS — R296 Repeated falls: Secondary | ICD-10-CM | POA: Diagnosis not present

## 2021-05-03 DIAGNOSIS — G4733 Obstructive sleep apnea (adult) (pediatric): Secondary | ICD-10-CM | POA: Diagnosis not present

## 2021-05-03 DIAGNOSIS — I5030 Unspecified diastolic (congestive) heart failure: Secondary | ICD-10-CM | POA: Diagnosis not present

## 2021-05-03 DIAGNOSIS — I10 Essential (primary) hypertension: Secondary | ICD-10-CM | POA: Diagnosis not present

## 2021-05-06 ENCOUNTER — Encounter (INDEPENDENT_AMBULATORY_CARE_PROVIDER_SITE_OTHER): Payer: Medicare HMO

## 2021-05-06 DIAGNOSIS — B3731 Acute candidiasis of vulva and vagina: Secondary | ICD-10-CM | POA: Diagnosis not present

## 2021-05-06 DIAGNOSIS — L299 Pruritus, unspecified: Secondary | ICD-10-CM | POA: Diagnosis not present

## 2021-05-06 DIAGNOSIS — E039 Hypothyroidism, unspecified: Secondary | ICD-10-CM | POA: Diagnosis not present

## 2021-05-06 DIAGNOSIS — K625 Hemorrhage of anus and rectum: Secondary | ICD-10-CM | POA: Diagnosis not present

## 2021-05-07 DIAGNOSIS — K625 Hemorrhage of anus and rectum: Secondary | ICD-10-CM | POA: Diagnosis not present

## 2021-05-07 DIAGNOSIS — E039 Hypothyroidism, unspecified: Secondary | ICD-10-CM | POA: Diagnosis not present

## 2021-05-07 DIAGNOSIS — J449 Chronic obstructive pulmonary disease, unspecified: Secondary | ICD-10-CM | POA: Diagnosis not present

## 2021-05-07 DIAGNOSIS — L299 Pruritus, unspecified: Secondary | ICD-10-CM | POA: Diagnosis not present

## 2021-05-08 ENCOUNTER — Other Ambulatory Visit: Payer: Self-pay

## 2021-05-08 NOTE — Patient Outreach (Signed)
[facility]) Care Management ? ?05/08/2021 ? ?Marletta Bousquet ?Jan 07, 1931 ?[id number] ? ? ?Telephone Assessment ? ? ? ?Upon chart review noted that patient currently inpatient at rehab facility. ? ? ? ?Plan: ?RN CM will make outreach attempt post discharge and/or within the month of April.  ? ? ?Enzo Montgomery, RN,BSN,CCM ?[facility] Care Management ?Telephonic Care Management Coordinator ?Direct Phone: [phone] ?Toll Free: [phone] ?Fax: [phone] ? ?

## 2021-05-09 ENCOUNTER — Ambulatory Visit: Payer: Self-pay

## 2021-05-09 DIAGNOSIS — R300 Dysuria: Secondary | ICD-10-CM | POA: Diagnosis not present

## 2021-05-09 DIAGNOSIS — J449 Chronic obstructive pulmonary disease, unspecified: Secondary | ICD-10-CM | POA: Diagnosis not present

## 2021-05-09 DIAGNOSIS — I5030 Unspecified diastolic (congestive) heart failure: Secondary | ICD-10-CM | POA: Diagnosis not present

## 2021-05-09 DIAGNOSIS — E559 Vitamin D deficiency, unspecified: Secondary | ICD-10-CM | POA: Diagnosis not present

## 2021-05-10 DIAGNOSIS — L299 Pruritus, unspecified: Secondary | ICD-10-CM | POA: Diagnosis not present

## 2021-05-10 DIAGNOSIS — K5900 Constipation, unspecified: Secondary | ICD-10-CM | POA: Diagnosis not present

## 2021-05-10 DIAGNOSIS — R300 Dysuria: Secondary | ICD-10-CM | POA: Diagnosis not present

## 2021-05-13 DIAGNOSIS — J449 Chronic obstructive pulmonary disease, unspecified: Secondary | ICD-10-CM | POA: Diagnosis not present

## 2021-05-13 DIAGNOSIS — N390 Urinary tract infection, site not specified: Secondary | ICD-10-CM | POA: Diagnosis not present

## 2021-05-13 DIAGNOSIS — L299 Pruritus, unspecified: Secondary | ICD-10-CM | POA: Diagnosis not present

## 2021-05-16 DIAGNOSIS — L299 Pruritus, unspecified: Secondary | ICD-10-CM | POA: Diagnosis not present

## 2021-05-16 DIAGNOSIS — N390 Urinary tract infection, site not specified: Secondary | ICD-10-CM | POA: Diagnosis not present

## 2021-05-20 ENCOUNTER — Other Ambulatory Visit: Payer: Self-pay

## 2021-05-20 NOTE — Patient Outreach (Signed)
[name]) Care Management ? ?05/20/2021 ? ?[name] ?[birth year] ?[id number] ? ? ?Telephone Assessment ? ? ? ?Upon chart review noted that patient remains in SNF. ? ? ? ?Plan: ?RN CM will make outreach attempt within the month of April. ? ?Enzo Montgomery, RN,BSN,CCM ?[facility] Care Management ?Telephonic Care Management Coordinator ?Direct Phone: [phone] ?Toll Free: [phone] ?Fax: [phone] ? ?

## 2021-05-21 DIAGNOSIS — G4733 Obstructive sleep apnea (adult) (pediatric): Secondary | ICD-10-CM | POA: Diagnosis not present

## 2021-05-21 DIAGNOSIS — R296 Repeated falls: Secondary | ICD-10-CM | POA: Diagnosis not present

## 2021-05-21 DIAGNOSIS — I10 Essential (primary) hypertension: Secondary | ICD-10-CM | POA: Diagnosis not present

## 2021-05-21 DIAGNOSIS — I5030 Unspecified diastolic (congestive) heart failure: Secondary | ICD-10-CM | POA: Diagnosis not present

## 2021-05-24 DIAGNOSIS — Z09 Encounter for follow-up examination after completed treatment for conditions other than malignant neoplasm: Secondary | ICD-10-CM | POA: Diagnosis not present

## 2021-06-04 ENCOUNTER — Other Ambulatory Visit: Payer: Self-pay

## 2021-06-04 NOTE — Patient Outreach (Signed)
[facility]) Care Management ? ?06/04/2021 ? ?[name] ?27-Dec-1930 ?[id number] ? ? ?Telephone Assessment ? ? ? ?RN CM placed call to follow up with patient as she discharged from SNF. Main number listed for patient disconnected. RN CM attempted emergency contact/daughter-Elizabeth. She reports that patient has now moved to ALF([facility]) for long term residency. Patient is doing well and has adjusted. Daughter feels better about patient being there versus at home alone.  ? ? ? ? ? ?Plan: ?RN CM will close case.  ? ?Enzo Montgomery, RN,BSN,CCM ?[facility] Care Management ?Telephonic Care Management Coordinator ?Direct Phone: [phone] ?Toll Free: [phone] ?Fax: [phone]' ?

## 2021-06-27 ENCOUNTER — Ambulatory Visit (INDEPENDENT_AMBULATORY_CARE_PROVIDER_SITE_OTHER): Payer: Medicare HMO | Admitting: Nurse Practitioner

## 2021-06-27 ENCOUNTER — Encounter (INDEPENDENT_AMBULATORY_CARE_PROVIDER_SITE_OTHER): Payer: Self-pay | Admitting: Nurse Practitioner

## 2021-06-27 VITALS — BP 123/71 | HR 59 | Resp 18

## 2021-06-27 DIAGNOSIS — I890 Lymphedema, not elsewhere classified: Secondary | ICD-10-CM

## 2021-06-27 DIAGNOSIS — I509 Heart failure, unspecified: Secondary | ICD-10-CM

## 2021-06-27 DIAGNOSIS — I10 Essential (primary) hypertension: Secondary | ICD-10-CM

## 2021-06-30 ENCOUNTER — Other Ambulatory Visit: Payer: Self-pay

## 2021-06-30 ENCOUNTER — Emergency Department: Payer: Medicare HMO

## 2021-06-30 ENCOUNTER — Emergency Department
Admission: EM | Admit: 2021-06-30 | Discharge: 2021-06-30 | Disposition: A | Discharge status: Home / Self Care | Payer: Medicare HMO | Attending: Emergency Medicine | Admitting: Emergency Medicine

## 2021-06-30 ENCOUNTER — Encounter: Payer: Self-pay | Admitting: Emergency Medicine

## 2021-06-30 DIAGNOSIS — R4182 Altered mental status, unspecified: Secondary | ICD-10-CM | POA: Insufficient documentation

## 2021-06-30 DIAGNOSIS — F0390 Unspecified dementia without behavioral disturbance: Secondary | ICD-10-CM | POA: Insufficient documentation

## 2021-06-30 DIAGNOSIS — Z7951 Long term (current) use of inhaled steroids: Secondary | ICD-10-CM | POA: Insufficient documentation

## 2021-06-30 DIAGNOSIS — N189 Chronic kidney disease, unspecified: Secondary | ICD-10-CM | POA: Diagnosis not present

## 2021-06-30 DIAGNOSIS — J449 Chronic obstructive pulmonary disease, unspecified: Secondary | ICD-10-CM | POA: Insufficient documentation

## 2021-06-30 DIAGNOSIS — R410 Disorientation, unspecified: Secondary | ICD-10-CM | POA: Insufficient documentation

## 2021-06-30 DIAGNOSIS — E871 Hypo-osmolality and hyponatremia: Secondary | ICD-10-CM | POA: Diagnosis not present

## 2021-06-30 DIAGNOSIS — I509 Heart failure, unspecified: Secondary | ICD-10-CM | POA: Diagnosis not present

## 2021-06-30 LAB — COMPREHENSIVE METABOLIC PANEL
ALT: 12 U/L (ref 0–44)
AST: 23 U/L (ref 15–41)
Albumin: 3.4 g/dL — ABNORMAL LOW (ref 3.5–5.0)
Alkaline Phosphatase: 89 U/L (ref 38–126)
Anion gap: 7 (ref 5–15)
BUN: 21 mg/dL (ref 8–23)
CO2: 25 mmol/L (ref 22–32)
Calcium: 9.8 mg/dL (ref 8.9–10.3)
Chloride: 100 mmol/L (ref 98–111)
Creatinine, Ser: 0.89 mg/dL (ref 0.44–1.00)
GFR, Estimated: >60 mL/min (ref >60)
Glucose, Bld: 109 mg/dL — ABNORMAL HIGH (ref 70–99)
Potassium: 3.8 mmol/L (ref 3.5–5.1)
Sodium: 132 mmol/L — ABNORMAL LOW (ref 135–145)
Total Bilirubin: 0.7 mg/dL (ref 0.3–1.2)
Total Protein: 7.1 g/dL (ref 6.5–8.1)

## 2021-06-30 LAB — URINALYSIS, ROUTINE W REFLEX MICROSCOPIC
Bilirubin Urine: NEGATIVE
Glucose, UA: NEGATIVE mg/dL
Hgb urine dipstick: NEGATIVE
Ketones, ur: NEGATIVE mg/dL
Leukocytes,Ua: NEGATIVE
Nitrite: NEGATIVE
Protein, ur: NEGATIVE mg/dL
Specific Gravity, Urine: 1.013 (ref 1.005–1.030)
pH: 7 (ref 5.0–8.0)

## 2021-06-30 LAB — CBC
HCT: 35.8 % — ABNORMAL LOW (ref 36.0–46.0)
Hemoglobin: 11 g/dL — ABNORMAL LOW (ref 12.0–15.0)
MCH: 24.8 pg — ABNORMAL LOW (ref 26.0–34.0)
MCHC: 30.7 g/dL (ref 30.0–36.0)
MCV: 80.8 fL (ref 80.0–100.0)
Platelets: 224 10*3/uL (ref 150–400)
RBC: 4.43 MIL/uL (ref 3.87–5.11)
RDW: 17.6 % — ABNORMAL HIGH (ref 11.5–15.5)
WBC: 6.9 10*3/uL (ref 4.0–10.5)
nRBC: 0 % (ref 0.0–0.2)

## 2021-06-30 MED ORDER — ACETAMINOPHEN 325 MG PO TABS
650.0000 mg | ORAL_TABLET | Freq: Once | ORAL | Status: AC
  Administered 2021-06-30: 650 mg via ORAL
  Filled 2021-06-30: qty 2

## 2021-06-30 MED ORDER — ALBUTEROL SULFATE (2.5 MG/3ML) 0.083% IN NEBU
2.5000 mg | INHALATION_SOLUTION | Freq: Once | RESPIRATORY_TRACT | Status: AC
  Administered 2021-06-30: 2.5 mg via RESPIRATORY_TRACT
  Filled 2021-06-30: qty 3

## 2021-06-30 NOTE — ED Notes (Signed)
1 set of blood cultures sent to lab.  

## 2021-06-30 NOTE — ED Notes (Signed)
Gave pt update to [place], [place] at [facility]  ?

## 2021-06-30 NOTE — ED Triage Notes (Signed)
Pt to ED via [facility] from [facility] for [place] since Friday. Per EMS pt family concerned about pt being more altered than normal. Pt was on Abx for UTI 2 weeks ago. Pt currently A&Ox4 upon assessment.  ?Pt denies Urinary symptoms, pt c/o nausea. Denies Vomiting and diarrhea.  ? ?Pt has bilateral edema present to lower extremities  ?EMS VS ?98.2, 83, 22 RR, 96% RA, CBG 119 ?

## 2021-06-30 NOTE — ED Provider Notes (Signed)
? ?[facility] ?Provider Note ? ? ? Event Date/Time  ? First MD Initiated Contact with Patient 06/30/21 1702   ?  (approximate) ? ? ?History  ? ?Altered Mental Status ? ? ?HPI ? ?[name] is a 88 y.o. female with extensive past medical history including CHF, COPD, neuropathy, atrial fibrillation, CKD who presents with reports of confusion.  Family reports that patient has had intermittent episodes of confusion, difficulty with memory recently.  They are concerned that she could have a UTI.  Has also recently been diagnosed with likely early stage dementia as well.  Patient has no complaints at this time. ?  ? ? ?Physical Exam  ? ?Triage Vital Signs: ?ED Triage Vitals  ?Enc Vitals Group  ?   BP 06/30/21 1646 (!) 154/75  ?   Pulse Rate 06/30/21 1646 70  ?   Resp 06/30/21 1646 16  ?   Temp 06/30/21 1646 98.4 ?F (36.9 ?C)  ?   Temp Source 06/30/21 1646 Oral  ?   SpO2 06/30/21 1646 95 %  ?   Weight 06/30/21 1652 129.5 kg (285 lb 7.9 oz)  ?   Height 06/30/21 1652 1.626 m ('5\' 4"'$ )  ?   Head Circumference --   ?   Peak Flow --   ?   Pain Score 06/30/21 1647 3  ?   Pain Loc --   ?   Pain Edu? --   ?   Excl. in [place]? --   ? ? ?Most recent vital signs: ?Vitals:  ? 06/30/21 2020 06/30/21 2030  ?BP: (!) 147/74 (!) 152/91  ?Pulse: 74 77  ?Resp: (!) 23 (!) 22  ?Temp:    ?SpO2: 94% 100%  ? ? ? ?General: Awake, no distress.  ?CV:  Good peripheral perfusion.  ?Resp:  Normal effort.  ?Abd:  No distention.  ?Other:   ? ? ?ED Results / Procedures / Treatments  ? ?Labs ?(all labs ordered are listed, but only abnormal results are displayed) ?Labs Reviewed  ?CBC - Abnormal; Notable for the following components:  ?    Result Value  ? Hemoglobin 11.0 (*)   ? HCT 35.8 (*)   ? MCH 24.8 (*)   ? RDW 17.6 (*)   ? All other components within normal limits  ?COMPREHENSIVE METABOLIC PANEL - Abnormal; Notable for the following components:  ? Sodium 132 (*)   ? Glucose, Bld 109 (*)   ? Albumin 3.4 (*)   ? All other components  within normal limits  ?URINALYSIS, ROUTINE W REFLEX MICROSCOPIC - Abnormal; Notable for the following components:  ? Color, Urine YELLOW (*)   ? APPearance CLEAR (*)   ? All other components within normal limits  ? ? ? ?EKG ? ?ED ECG REPORT ?I, Lavonia Drafts, the attending physician, personally viewed and interpreted this ECG. ? ?Date: 06/30/2021 ? ?Rhythm: Atrial fibrillation ?QRS Axis: normal ?Intervals: PVC ?ST/T Wave abnormalities: normal ?Narrative Interpretation: no evidence of acute ischemia ? ? ? ?RADIOLOGY ?Chest x-ray interpreted by me, no acute abnormality ?CT head normal per radiology ? ? ? ?PROCEDURES: ? ?Critical Care performed:  ? ?Procedures ? ? ?MEDICATIONS ORDERED IN ED: ?Medications  ?acetaminophen (TYLENOL) tablet 650 mg (650 mg Oral Given 06/30/21 2015)  ?albuterol (PROVENTIL) (2.5 MG/3ML) 0.083% nebulizer solution 2.5 mg (2.5 mg Nebulization Given 06/30/21 2032)  ? ? ? ?IMPRESSION / MDM / ASSESSMENT AND PLAN / ED COURSE  ?I reviewed the triage vital signs and the nursing  notes. ? ?Patient presents with intermittent episodes of confusion as detailed above. ? ?Differential includes dementia, UTI, pneumonia, electrolyte abnormality. ? ?Overall the patient is well-appearing with reassuring exam.  Lab work reviewed and is unremarkable.  Normal white blood cell count.  Mild hyponatremia ? ?Urinalysis not consistent with UTI. ? ?CT head performed as well because patient has complained of headache apparently although symptoms not consistent with CVA ? ?Family reports the patient appears to be at her baseline, discussed with family that dementia can often present in this manner, considered admission however no treatable condition identified at this time, recommend outpatient follow-up with neurology ? ? ? ? ? ?  ? ? ?FINAL CLINICAL IMPRESSION(S) / ED DIAGNOSES  ? ?Final diagnoses:  ?Confusion  ?Dementia, unspecified dementia severity, unspecified dementia type, unspecified whether behavioral, psychotic, or  mood disturbance or anxiety ([place])  ? ? ? ?Rx / DC Orders  ? ?ED Discharge Orders   ? ? None  ? ?  ? ? ? ?Note:  This document was prepared using Dragon voice recognition software and may include unintentional dictation errors. ?  ?Lavonia Drafts, MD ?06/30/21 2319 ? ?

## 2021-07-04 ENCOUNTER — Inpatient Hospital Stay
Admission: EM | Admit: 2021-07-04 | Discharge: 2021-07-12 | DRG: 177 | Disposition: A | Discharge status: Home / Self Care | Payer: Medicare HMO | Source: Skilled Nursing Facility | Attending: Internal Medicine | Admitting: Internal Medicine

## 2021-07-04 ENCOUNTER — Other Ambulatory Visit: Payer: Self-pay

## 2021-07-04 ENCOUNTER — Emergency Department: Payer: Medicare HMO

## 2021-07-04 DIAGNOSIS — M109 Gout, unspecified: Secondary | ICD-10-CM | POA: Diagnosis present

## 2021-07-04 DIAGNOSIS — M1990 Unspecified osteoarthritis, unspecified site: Secondary | ICD-10-CM | POA: Diagnosis present

## 2021-07-04 DIAGNOSIS — I10 Essential (primary) hypertension: Secondary | ICD-10-CM | POA: Diagnosis present

## 2021-07-04 DIAGNOSIS — Z6841 Body Mass Index (BMI) 40.0 and over, adult: Secondary | ICD-10-CM | POA: Diagnosis not present

## 2021-07-04 DIAGNOSIS — Z79890 Hormone replacement therapy (postmenopausal): Secondary | ICD-10-CM | POA: Diagnosis not present

## 2021-07-04 DIAGNOSIS — K219 Gastro-esophageal reflux disease without esophagitis: Secondary | ICD-10-CM | POA: Diagnosis present

## 2021-07-04 DIAGNOSIS — U071 COVID-19: Secondary | ICD-10-CM | POA: Diagnosis present

## 2021-07-04 DIAGNOSIS — L03116 Cellulitis of left lower limb: Secondary | ICD-10-CM | POA: Diagnosis not present

## 2021-07-04 DIAGNOSIS — Z888 Allergy status to other drugs, medicaments and biological substances status: Secondary | ICD-10-CM

## 2021-07-04 DIAGNOSIS — G4730 Sleep apnea, unspecified: Secondary | ICD-10-CM | POA: Diagnosis present

## 2021-07-04 DIAGNOSIS — I5033 Acute on chronic diastolic (congestive) heart failure: Secondary | ICD-10-CM | POA: Diagnosis present

## 2021-07-04 DIAGNOSIS — E039 Hypothyroidism, unspecified: Secondary | ICD-10-CM | POA: Diagnosis present

## 2021-07-04 DIAGNOSIS — I4891 Unspecified atrial fibrillation: Secondary | ICD-10-CM | POA: Diagnosis present

## 2021-07-04 DIAGNOSIS — I509 Heart failure, unspecified: Secondary | ICD-10-CM | POA: Diagnosis present

## 2021-07-04 DIAGNOSIS — Z8614 Personal history of Methicillin resistant Staphylococcus aureus infection: Secondary | ICD-10-CM | POA: Diagnosis not present

## 2021-07-04 DIAGNOSIS — E785 Hyperlipidemia, unspecified: Secondary | ICD-10-CM | POA: Diagnosis present

## 2021-07-04 DIAGNOSIS — L03119 Cellulitis of unspecified part of limb: Secondary | ICD-10-CM | POA: Diagnosis not present

## 2021-07-04 DIAGNOSIS — L03115 Cellulitis of right lower limb: Secondary | ICD-10-CM | POA: Diagnosis not present

## 2021-07-04 DIAGNOSIS — Z886 Allergy status to analgesic agent status: Secondary | ICD-10-CM

## 2021-07-04 DIAGNOSIS — I428 Other cardiomyopathies: Secondary | ICD-10-CM | POA: Diagnosis not present

## 2021-07-04 DIAGNOSIS — I110 Hypertensive heart disease with heart failure: Secondary | ICD-10-CM | POA: Diagnosis present

## 2021-07-04 DIAGNOSIS — J9600 Acute respiratory failure, unspecified whether with hypoxia or hypercapnia: Principal | ICD-10-CM

## 2021-07-04 DIAGNOSIS — Z7901 Long term (current) use of anticoagulants: Secondary | ICD-10-CM | POA: Diagnosis not present

## 2021-07-04 DIAGNOSIS — I482 Chronic atrial fibrillation, unspecified: Secondary | ICD-10-CM

## 2021-07-04 DIAGNOSIS — J449 Chronic obstructive pulmonary disease, unspecified: Secondary | ICD-10-CM | POA: Diagnosis present

## 2021-07-04 DIAGNOSIS — I739 Peripheral vascular disease, unspecified: Secondary | ICD-10-CM | POA: Diagnosis present

## 2021-07-04 DIAGNOSIS — J9601 Acute respiratory failure with hypoxia: Secondary | ICD-10-CM

## 2021-07-04 DIAGNOSIS — J4489 Other specified chronic obstructive pulmonary disease: Secondary | ICD-10-CM

## 2021-07-04 DIAGNOSIS — Z882 Allergy status to sulfonamides status: Secondary | ICD-10-CM | POA: Diagnosis not present

## 2021-07-04 LAB — CBC
HCT: 37 % (ref 36.0–46.0)
Hemoglobin: 11.4 g/dL — ABNORMAL LOW (ref 12.0–15.0)
MCH: 25 pg — ABNORMAL LOW (ref 26.0–34.0)
MCHC: 30.8 g/dL (ref 30.0–36.0)
MCV: 81.1 fL (ref 80.0–100.0)
Platelets: 204 10*3/uL (ref 150–400)
RBC: 4.56 MIL/uL (ref 3.87–5.11)
RDW: 17.8 % — ABNORMAL HIGH (ref 11.5–15.5)
WBC: 5 10*3/uL (ref 4.0–10.5)
nRBC: 0 % (ref 0.0–0.2)

## 2021-07-04 LAB — RESP PANEL BY RT-PCR (FLU A&B, COVID) ARPGX2
Influenza A by PCR: NEGATIVE
Influenza B by PCR: NEGATIVE
SARS Coronavirus 2 by RT PCR: POSITIVE — AB

## 2021-07-04 LAB — BASIC METABOLIC PANEL
Anion gap: 3 — ABNORMAL LOW (ref 5–15)
BUN: 26 mg/dL — ABNORMAL HIGH (ref 8–23)
CO2: 26 mmol/L (ref 22–32)
Calcium: 9.9 mg/dL (ref 8.9–10.3)
Chloride: 106 mmol/L (ref 98–111)
Creatinine, Ser: 1.09 mg/dL — ABNORMAL HIGH (ref 0.44–1.00)
GFR, Estimated: 49 mL/min — ABNORMAL LOW (ref >60)
Glucose, Bld: 113 mg/dL — ABNORMAL HIGH (ref 70–99)
Potassium: 3.9 mmol/L (ref 3.5–5.1)
Sodium: 135 mmol/L (ref 135–145)

## 2021-07-04 LAB — D-DIMER, QUANTITATIVE: D-Dimer, Quant: 0.95 ug/mL-FEU — ABNORMAL HIGH (ref 0.00–0.50)

## 2021-07-04 LAB — FIBRINOGEN: Fibrinogen: 467 mg/dL (ref 210–475)

## 2021-07-04 LAB — TROPONIN I (HIGH SENSITIVITY): Troponin I (High Sensitivity): 12 ng/L (ref <18)

## 2021-07-04 LAB — LACTATE DEHYDROGENASE: LDH: 150 U/L (ref 98–192)

## 2021-07-04 LAB — FERRITIN: Ferritin: 30 ng/mL (ref 11–307)

## 2021-07-04 LAB — BRAIN NATRIURETIC PEPTIDE: B Natriuretic Peptide: 164.2 pg/mL — ABNORMAL HIGH (ref 0.0–100.0)

## 2021-07-04 MED ORDER — DEXAMETHASONE SODIUM PHOSPHATE 10 MG/ML IJ SOLN
10.0000 mg | Freq: Once | INTRAMUSCULAR | Status: AC
  Administered 2021-07-04: 10 mg via INTRAVENOUS
  Filled 2021-07-04: qty 1

## 2021-07-04 MED ORDER — ALBUTEROL SULFATE (2.5 MG/3ML) 0.083% IN NEBU: 2.5000 mg | INHALATION_SOLUTION | Freq: Four times a day (QID) | RESPIRATORY_TRACT | Status: DC | PRN

## 2021-07-04 MED ORDER — VITAMIN D (ERGOCALCIFEROL) 1.25 MG (50000 UNIT) PO CAPS
50000.0000 [IU] | ORAL_CAPSULE | ORAL | Status: DC
  Administered 2021-07-06: 50000 [IU] via ORAL
  Filled 2021-07-04: qty 1

## 2021-07-04 MED ORDER — PREDNISONE 50 MG PO TABS: 50.0000 mg | ORAL_TABLET | Freq: Every day | ORAL | Status: DC

## 2021-07-04 MED ORDER — METOPROLOL SUCCINATE ER 25 MG PO TB24
12.5000 mg | ORAL_TABLET | Freq: Every day | ORAL | Status: DC
  Administered 2021-07-05 – 2021-07-11 (×7): 12.5 mg via ORAL
  Filled 2021-07-04 (×8): qty 1

## 2021-07-04 MED ORDER — NIRMATRELVIR/RITONAVIR (PAXLOVID)TABLET
3.0000 | ORAL_TABLET | Freq: Two times a day (BID) | ORAL | Status: DC
Start: 2021-07-04 — End: 2021-07-04

## 2021-07-04 MED ORDER — FUROSEMIDE 10 MG/ML IJ SOLN
60.0000 mg | Freq: Once | INTRAMUSCULAR | Status: AC
  Administered 2021-07-04: 60 mg via INTRAVENOUS
  Filled 2021-07-04: qty 8

## 2021-07-04 MED ORDER — THERAPEUTIC MULTIVIT/MINERAL PO TABS: 1.0000 | ORAL_TABLET | Freq: Every day | ORAL | Status: DC

## 2021-07-04 MED ORDER — FUROSEMIDE 10 MG/ML IJ SOLN
40.0000 mg | Freq: Two times a day (BID) | INTRAMUSCULAR | Status: DC
  Administered 2021-07-04 – 2021-07-08 (×8): 40 mg via INTRAVENOUS
  Filled 2021-07-04 (×8): qty 4

## 2021-07-04 MED ORDER — ONDANSETRON HCL 4 MG/2ML IJ SOLN
4.0000 mg | Freq: Four times a day (QID) | INTRAMUSCULAR | Status: DC | PRN
Start: 2021-07-04 — End: 2021-07-12

## 2021-07-04 MED ORDER — DICLOFENAC SODIUM 1 % EX GEL
2.0000 g | Freq: Four times a day (QID) | CUTANEOUS | Status: DC
  Administered 2021-07-05 – 2021-07-12 (×20): 2 g via TOPICAL
  Filled 2021-07-04 (×2): qty 100

## 2021-07-04 MED ORDER — HYDROXYZINE HCL 10 MG PO TABS
10.0000 mg | ORAL_TABLET | Freq: Two times a day (BID) | ORAL | Status: DC
  Administered 2021-07-05 – 2021-07-12 (×14): 10 mg via ORAL
  Filled 2021-07-04 (×15): qty 1

## 2021-07-04 MED ORDER — ACETAMINOPHEN 650 MG RE SUPP: 650.0000 mg | Freq: Four times a day (QID) | RECTAL | Status: DC | PRN

## 2021-07-04 MED ORDER — MOLNUPIRAVIR EUA 200MG CAPSULE: 4.0000 | ORAL_CAPSULE | Freq: Two times a day (BID) | ORAL | Status: DC

## 2021-07-04 MED ORDER — IPRATROPIUM-ALBUTEROL 0.5-2.5 (3) MG/3ML IN SOLN: 3.0000 mL | Freq: Four times a day (QID) | RESPIRATORY_TRACT | Status: DC | PRN

## 2021-07-04 MED ORDER — MAGNESIUM HYDROXIDE 400 MG/5ML PO SUSP: 30.0000 mL | Freq: Every day | ORAL | Status: DC | PRN

## 2021-07-04 MED ORDER — TRAZODONE HCL 50 MG PO TABS
25.0000 mg | ORAL_TABLET | Freq: Every evening | ORAL | Status: DC | PRN
  Administered 2021-07-04 – 2021-07-12 (×4): 25 mg via ORAL
  Filled 2021-07-04 (×5): qty 1

## 2021-07-04 MED ORDER — PRESERVISION AREDS 2+MULTI VIT PO CAPS: 1.0000 | ORAL_CAPSULE | ORAL | Status: DC

## 2021-07-04 MED ORDER — NITROGLYCERIN 0.4 MG SL SUBL: 0.4000 mg | SUBLINGUAL_TABLET | SUBLINGUAL | Status: DC | PRN

## 2021-07-04 MED ORDER — APIXABAN 5 MG PO TABS
5.0000 mg | ORAL_TABLET | Freq: Two times a day (BID) | ORAL | Status: DC
  Administered 2021-07-04 – 2021-07-12 (×16): 5 mg via ORAL
  Filled 2021-07-04 (×16): qty 1

## 2021-07-04 MED ORDER — DULOXETINE HCL 30 MG PO CPEP
60.0000 mg | ORAL_CAPSULE | Freq: Every day | ORAL | Status: DC
  Administered 2021-07-05 – 2021-07-12 (×8): 60 mg via ORAL
  Filled 2021-07-04 (×6): qty 2
  Filled 2021-07-04: qty 1
  Filled 2021-07-04: qty 2

## 2021-07-04 MED ORDER — MONTELUKAST SODIUM 10 MG PO TABS
10.0000 mg | ORAL_TABLET | Freq: Every day | ORAL | Status: DC
  Administered 2021-07-04 – 2021-07-11 (×8): 10 mg via ORAL
  Filled 2021-07-04 (×8): qty 1

## 2021-07-04 MED ORDER — HYDROCOD POLI-CHLORPHE POLI ER 10-8 MG/5ML PO SUER: 5.0000 mL | Freq: Two times a day (BID) | ORAL | Status: DC | PRN

## 2021-07-04 MED ORDER — FAMOTIDINE 20 MG PO TABS
20.0000 mg | ORAL_TABLET | Freq: Every day | ORAL | Status: DC
  Administered 2021-07-04 – 2021-07-11 (×8): 20 mg via ORAL
  Filled 2021-07-04 (×8): qty 1

## 2021-07-04 MED ORDER — ONDANSETRON HCL 4 MG PO TABS: 4.0000 mg | ORAL_TABLET | Freq: Four times a day (QID) | ORAL | Status: DC | PRN

## 2021-07-04 MED ORDER — MORPHINE SULFATE (PF) 2 MG/ML IV SOLN: 1.0000 mg | INTRAVENOUS | Status: DC | PRN

## 2021-07-04 MED ORDER — MOLNUPIRAVIR EUA 200MG CAPSULE
4.0000 | ORAL_CAPSULE | Freq: Two times a day (BID) | ORAL | Status: AC
  Administered 2021-07-04 – 2021-07-09 (×9): 800 mg via ORAL
  Filled 2021-07-04 (×3): qty 4

## 2021-07-04 MED ORDER — GUAIFENESIN-DM 100-10 MG/5ML PO SYRP
10.0000 mL | ORAL_SOLUTION | ORAL | Status: DC | PRN
  Administered 2021-07-10 – 2021-07-11 (×3): 10 mL via ORAL
  Filled 2021-07-04 (×3): qty 10

## 2021-07-04 MED ORDER — IPRATROPIUM-ALBUTEROL 0.5-2.5 (3) MG/3ML IN SOLN
3.0000 mL | Freq: Once | RESPIRATORY_TRACT | Status: AC
  Administered 2021-07-04: 3 mL via RESPIRATORY_TRACT
  Filled 2021-07-04: qty 3

## 2021-07-04 MED ORDER — VITAMIN D 25 MCG (1000 UNIT) PO TABS
1000.0000 [IU] | ORAL_TABLET | Freq: Every day | ORAL | Status: DC
  Administered 2021-07-04 – 2021-07-12 (×9): 1000 [IU] via ORAL
  Filled 2021-07-04 (×9): qty 1

## 2021-07-04 MED ORDER — ASCORBIC ACID 500 MG PO TABS
500.0000 mg | ORAL_TABLET | Freq: Every day | ORAL | Status: DC
  Administered 2021-07-04 – 2021-07-12 (×9): 500 mg via ORAL
  Filled 2021-07-04 (×9): qty 1

## 2021-07-04 MED ORDER — VITAMIN B-12 1000 MCG PO TABS
500.0000 ug | ORAL_TABLET | Freq: Every day | ORAL | Status: DC
  Administered 2021-07-05 – 2021-07-12 (×8): 500 ug via ORAL
  Filled 2021-07-04 (×8): qty 1

## 2021-07-04 MED ORDER — SODIUM CHLORIDE 0.9 % IV SOLN
130.0000 mg | Freq: Two times a day (BID) | INTRAVENOUS | Status: DC
  Administered 2021-07-05 (×2): 130 mg via INTRAVENOUS
  Filled 2021-07-04: qty 2.08
  Filled 2021-07-04 (×2): qty 1.04

## 2021-07-04 MED ORDER — NIRMATRELVIR/RITONAVIR (PAXLOVID) TABLET (RENAL DOSING)
2.0000 | ORAL_TABLET | Freq: Two times a day (BID) | ORAL | Status: DC
  Filled 2021-07-04: qty 20

## 2021-07-04 MED ORDER — ACETAMINOPHEN 325 MG PO TABS
650.0000 mg | ORAL_TABLET | Freq: Four times a day (QID) | ORAL | Status: DC | PRN
  Administered 2021-07-04 – 2021-07-11 (×5): 650 mg via ORAL
  Filled 2021-07-04 (×5): qty 2

## 2021-07-04 MED ORDER — ZINC SULFATE 220 (50 ZN) MG PO CAPS
220.0000 mg | ORAL_CAPSULE | Freq: Every day | ORAL | Status: DC
  Administered 2021-07-04 – 2021-07-12 (×9): 220 mg via ORAL
  Filled 2021-07-04 (×9): qty 1

## 2021-07-04 MED ORDER — LORATADINE 10 MG PO TABS
10.0000 mg | ORAL_TABLET | Freq: Every day | ORAL | Status: DC
  Administered 2021-07-05 – 2021-07-12 (×8): 10 mg via ORAL
  Filled 2021-07-04 (×8): qty 1

## 2021-07-04 MED ORDER — LEVOTHYROXINE SODIUM 50 MCG PO TABS
150.0000 ug | ORAL_TABLET | Freq: Every day | ORAL | Status: DC
  Administered 2021-07-05 – 2021-07-12 (×7): 150 ug via ORAL
  Filled 2021-07-04: qty 1
  Filled 2021-07-04: qty 3
  Filled 2021-07-04 (×5): qty 1

## 2021-07-04 NOTE — Assessment & Plan Note (Addendum)
-   We will continue her DuoNebs and Singulair. ?

## 2021-07-04 NOTE — Assessment & Plan Note (Signed)
-   O2 protocol will be followed. ?- Management otherwise as above. ?- Should be on steroid therapy with IV Solu-Medrol. ?

## 2021-07-04 NOTE — ED Notes (Signed)
Mansy @ the bedside. ?

## 2021-07-04 NOTE — ED Notes (Signed)
Patient placed on purewick at this time. 

## 2021-07-04 NOTE — Assessment & Plan Note (Signed)
-   We will continue Synthroid. 

## 2021-07-04 NOTE — ED Provider Notes (Signed)
? ?[facility] ?Provider Note ? ? ? Event Date/Time  ? First MD Initiated Contact with Patient 07/04/21 1525   ?  (approximate) ? ?History  ? ?Chief Complaint: Shortness of Breath ? ?HPI ? ?[name] is a 88 y.o. female with a past medical history of HLD, COPD, gastric reflux, hypertension emergency department for shortness of breath.  According to EMS report patient was diagnosed with COVID on Tuesday at her nursing facility [facility].  Patient has been experiencing increased shortness of breath since.  Mild chest discomfort as well.  No baseline O2 requirement.  EMS states patient was satting in the low 90s on room air however did desat into the 80s with moving to the stretcher and placed patient on 2 L nasal cannula.  Here patient initially on 2 L nasal cannula we will take off oxygen to assess room air pulse ox.  Here patient is awake alert state mild shortness of breath otherwise denies any other symptoms.  No chest pain. ? ?Physical Exam  ? ?Triage Vital Signs: ?ED Triage Vitals  ?Enc Vitals Group  ?   BP 07/04/21 1530 128/84  ?   Pulse Rate 07/04/21 1530 (!) 44  ?   Resp 07/04/21 1530 (!) 23  ?   Temp 07/04/21 1530 98 ?F (36.7 ?C)  ?   Temp src --   ?   SpO2 07/04/21 1530 98 %  ?   Weight --   ?   Height --   ?   Head Circumference --   ?   Peak Flow --   ?   Pain Score 07/04/21 1527 4  ?   Pain Loc --   ?   Pain Edu? --   ?   Excl. in [place]? --   ? ? ?Most recent vital signs: ?Vitals:  ? 07/04/21 1530  ?BP: 128/84  ?Pulse: (!) 44  ?Resp: (!) 23  ?Temp: 98 ?F (36.7 ?C)  ?SpO2: 98%  ? ? ?General: Awake, no distress.  ?CV:  Good peripheral perfusion.  Regular rate and rhythm  ?Resp:  Normal effort.  Equal breath sounds bilaterally.  ?Abd:  No distention.  Soft, nontender.  No rebound or guarding. ?Other:  2+ lower extremity edema ? ? ?ED Results / Procedures / Treatments  ? ?EKG ? ?EKG viewed and interpreted by myself shows atrial fibrillation at 53 bpm with a narrow QRS, normal axis,  normal intervals, no concerning ST changes. ? ?RADIOLOGY ? ?I personally viewed the chest x-ray images does appear somewhat hazy bilaterally. ?Radiology is read the chest x-ray is vascular congestion without edema. ? ? ?MEDICATIONS ORDERED IN ED: ?Medications - No data to display ? ? ?IMPRESSION / MDM / ASSESSMENT AND PLAN / ED COURSE  ?I reviewed the triage vital signs and the nursing notes. ? ?Patient presents to the emergency department for shortness of breath diagnosed with COVID 2 days ago.  EMS states room air saturations in low 90s but did desat into the upper 80s when they moved her to the stretcher.  Patient currently satting 98% on 2 L mildly tachypneic 20 to 25 breaths/min.  We will take off oxygen to assess room air saturation.  We will obtain chest x-ray, lab work including cardiac enzymes and continue to closely monitor.  Patient agreeable to plan of care. ? ?Patient's COVID test is positive.  BNP is slightly elevated.  Troponin normal.  Chemistry is normal.  CBC shows no concerning findings normal white  blood cell count.  Given the chest x-ray findings of vascular congestion with desatting into the 80s with any movement currently satting well on 2 L we will start the patient on IV Decadron.  Patient given IV Lasix given her lower extremity edema and chest x-ray findings.  We will also dose oral Paxlovid.  Patient will be admitted to the hospital service for ongoing management.  Patient and family updated and are agreeable to the plan of care. ? ?CRITICAL CARE ?Performed by: Harvest Dark ? ? ?Total critical care time: 30 minutes ? ?Critical care time was exclusive of separately billable procedures and treating other patients. ? ?Critical care was necessary to treat or prevent imminent or life-threatening deterioration. ? ?Critical care was time spent personally by me on the following activities: development of treatment plan with patient and/or surrogate as well as nursing, discussions with  consultants, evaluation of patient's response to treatment, examination of patient, obtaining history from patient or surrogate, ordering and performing treatments and interventions, ordering and review of laboratory studies, ordering and review of radiographic studies, pulse oximetry and re-evaluation of patient's condition. ? ? ?FINAL CLINICAL IMPRESSION(S) / ED DIAGNOSES  ? ?Dyspnea ?COVID ?Hypoxia ? ?Note:  This document was prepared using Dragon voice recognition software and may include unintentional dictation errors. ?  Harvest Dark, MD ?07/04/21 2103 ? ?

## 2021-07-04 NOTE — Assessment & Plan Note (Signed)
-   We will continue Toprol-XL. 

## 2021-07-04 NOTE — Assessment & Plan Note (Signed)
-   We will continue Toprol-XL and Eliquis. 

## 2021-07-04 NOTE — ED Triage Notes (Signed)
Pt comes via EMs from [facility] with c/o SOB. Pt denies any SOb. Pt does state left sided pain. Pt is covid+ dx with Tuesday.  ? ?HR-40s-60s ?CBG-133 ? ?EMS reports once they moved her to stretcher they did place her on 2L [place]. ?

## 2021-07-04 NOTE — Assessment & Plan Note (Signed)
-   The patient be admitted to a cardiac telemetry bed. ?- She will be diuresed with IV Lasix. ?- We will follow serial troponins. ?- She had a 2D echo with EF of 60 to 65% last year. ?

## 2021-07-04 NOTE — ED Notes (Signed)
EDP @ the bedside. ?

## 2021-07-04 NOTE — Assessment & Plan Note (Signed)
-   The patient will be placed on molnupiravir. ?- We will continue with steroid therapy with IV Solu-Medrol. ?- We will follow inflammatory markers. ?- She will be placed on mucolytics, zinc sulfate, vitamin C and vitamin D3. ?

## 2021-07-04 NOTE — H&P (Addendum)
?  ?  ?[place] ? ? ?PATIENT NAME: [name]   ? ?MR#:  [id number] ? ?DATE OF BIRTH:  1930-04-09 ? ?DATE OF ADMISSION:  07/04/2021 ? ?PRIMARY CARE PHYSICIAN: [facility]  ? ?Patient is coming from: Home ? ?REQUESTING/REFERRING PHYSICIAN: Harvest Dark, MD ? ?CHIEF COMPLAINT:  ? ?Chief Complaint  ?Patient presents with  ? Shortness of Breath  ? ? ?HISTORY OF PRESENT ILLNESS:  ?[name] is a 88 y.o. Caucasian female with medical history significant for asthma, diastolic CHF, COPD, GERD, hypertension and hypothyroidism, who presented to the emergency room with acute onset of worsening dyspnea with associated cough productive of clear sputum as well as occasional wheezing since Sunday.  She admitted to nausea and diarrhea only on Sunday.  She denies any fever or chills.  No loss of taste or smell.  She has not had any vaccines for COVID 19.  She was diagnosed with COVID-19 on Tuesday.  She has been having brief chest pain this morning and denied currently.  She admits to worsening lower extremity edema as well as orthopnea and paroxysmal nocturnal dyspnea with cough.  She admits to dyspnea on exertion.  No dysuria, oliguria or hematuria or flank pain.  No bleeding diathesis. ? ?ED Course: When she came to the ER respiratory it was 23 and heart rate was 44 and pulse oximetry was 90% on 2 L of O2 nasal cannula and on room air 90 to 92%.  Later on heart rate was 65 respiratory rate 16.  Labs revealed a BUN of 26 and creatinine 1.09 otherwise unremarkable BMP.  BN P was 164.2 and high-sensitivity troponin I was 12.  CBC showed hemoglobin 11.4 hematocrit 37.  Influenza antigens came back negative however COVID-19 PCR is still positive. ?EKG as reviewed by me : showed atrial fibrillation with controlled ventricular sponsor 53 with left axis deviation and low voltage QRS. ?Imaging: 2 view chest x-ray showed mild vascular congestion without edema and mild left lower lobe atelectasis. ? ?The patient  was ordered molnupiravir and 40 mg of IV Lasix.  She will be admitted to a cardiac telemetry bed for further evaluation and management. ?PAST MEDICAL HISTORY:  ? ?Past Medical History:  ?Diagnosis Date  ? Arthritis   ? Asthma   ? CHF (congestive heart failure) ([place])   ? COPD (chronic obstructive pulmonary disease) ([place])   ? Cough   ? GERD (gastroesophageal reflux disease)   ? Gout   ? Hiatal hernia   ? Hypertension   ? Hypothyroidism   ? MRSA (methicillin resistant staph aureus) culture positive   ? Neuropathic pain of right lower extremity   ? Peripheral vascular disease ([place])   ? "poor circulation"  ? Seasonal allergies   ? Shortness of breath dyspnea   ? Sleep apnea   ? uses CPAP (sometimes)  ? ? ?PAST SURGICAL HISTORY:  ? ?Past Surgical History:  ?Procedure Laterality Date  ? ANKLE ARTHROSCOPY    ? BACK SURGERY    ? L4-L5 Decompression  ? CARDIAC CATHETERIZATION  1995  ? CATARACT EXTRACTION W/PHACO Right 07/19/2014  ? Procedure: CATARACT EXTRACTION PHACO AND INTRAOCULAR LENS PLACEMENT (IOC);  Surgeon: Leandrew Koyanagi, MD;  Location: [place];  Service: Ophthalmology;  Laterality: Right;  ? CATARACT EXTRACTION W/PHACO Left 08/23/2014  ? Procedure: CATARACT EXTRACTION PHACO AND INTRAOCULAR LENS PLACEMENT (IOC);  Surgeon: Leandrew Koyanagi, MD;  Location: [place];  Service: Ophthalmology;  Laterality: Left;  ? CHOLECYSTECTOMY    ?  COLONOSCOPY    ? PARATHYROIDECTOMY  2003  ? TONSILLECTOMY    ? UVULOPALATOPHARYNGOPLASTY    ? ? ?SOCIAL HISTORY:  ? ?Social History  ? ?Tobacco Use  ? Smoking status: Never  ?  Passive exposure: Yes  ? Smokeless tobacco: Never  ?Substance Use Topics  ? Alcohol use: No  ? ? ?FAMILY HISTORY:  ? ?Family History  ?Problem Relation Age of Onset  ? Cancer Mother   ? Hyperlipidemia Son   ? Diabetes Son   ? Cancer Maternal Grandmother   ? ? ?DRUG ALLERGIES:  ? ?Allergies  ?Allergen Reactions  ? Prednisone Other (See Comments)  ?  Irritability  ? Aspirin Nausea Only  ?  Sulfa Antibiotics Rash  ? Sulfacetamide Sodium Rash  ? ? ?REVIEW OF SYSTEMS:  ? ?ROS ?As per history of present illness. All pertinent systems were reviewed above. Constitutional, HEENT, cardiovascular, respiratory, GI, GU, musculoskeletal, neuro, psychiatric, endocrine, integumentary and hematologic systems were reviewed and are otherwise negative/unremarkable except for positive findings mentioned above in the HPI. ? ? ?MEDICATIONS AT HOME:  ? ?Prior to Admission medications   ?Medication Sig Start Date End Date Taking? Authorizing Provider  ?apixaban (ELIQUIS) 5 MG TABS tablet Take 5 mg by mouth 2 (two) times daily.   Yes [provider]  ?diclofenac Sodium (VOLTAREN) 1 % GEL Apply 2 g topically 4 (four) times daily.   Yes [provider]  ?DULoxetine (CYMBALTA) 60 MG capsule Take 60 mg by mouth daily. 05/04/20  Yes [provider]  ?famotidine (PEPCID) 20 MG tablet Take 1 tablet by mouth at bedtime. 05/20/21  Yes [provider]  ?furosemide (LASIX) 20 MG tablet Take 20 mg by mouth 2 (two) times daily. 04/04/21  Yes [provider]  ?hydrOXYzine (ATARAX) 10 MG tablet Take 10 mg by mouth 2 (two) times daily. 04/01/21  Yes [provider]  ?levothyroxine (SYNTHROID) 150 MCG tablet Take 150 mcg by mouth daily. 04/01/21  Yes [provider]  ?loratadine (CLARITIN) 10 MG tablet Take 10 mg by mouth daily.   Yes [provider]  ?metoprolol succinate (TOPROL-XL) 25 MG 24 hr tablet Take 0.5 tablets (12.5 mg total) by mouth at bedtime. 07/10/20  Yes Wieting, Richard, MD  ?molnupiravir EUA (LAGEVRIO) 200 mg CAPS capsule Take 4 capsules by mouth 2 (two) times daily.   Yes [provider]  ?montelukast (SINGULAIR) 10 MG tablet Take 10 mg by mouth daily.   Yes [provider]  ?Multiple Vitamins-Minerals (PRESERVISION AREDS 2+MULTI VIT) CAPS Take 1 capsule by mouth as directed.   Yes [provider]  ?therapeutic multivitamin-minerals  (THERAGRAN-M) tablet Take 1 tablet by mouth daily.   Yes [provider]  ?triamcinolone cream (KENALOG) 0.1 % Apply 1 application topically 2 (two) times daily as needed.   Yes [provider]  ?vitamin B-12 (CYANOCOBALAMIN) 500 MCG tablet Take 500 mcg by mouth daily.   Yes [provider]  ?Vitamin D, Ergocalciferol, (DRISDOL) 1.25 MG (50000 UNIT) CAPS capsule Take 50,000 Units by mouth every 7 (seven) days. Saturday   Yes [provider]  ?acetaminophen (TYLENOL) 500 MG tablet Take 500-1,000 mg by mouth every 6 (six) hours as needed for mild pain or moderate pain.    [provider]  ?Amino Acids-Protein Hydrolys (FEEDING SUPPLEMENT, PRO-STAT SUGAR FREE 64,) LIQD Take 30 mLs by mouth daily.    [provider]  ?benzonatate (TESSALON) 100 MG capsule Take 1 capsule by mouth 3 (three) times daily as  needed. ?Patient not taking: Reported on 07/04/2021 06/19/21   [provider]  ?Cholecalciferol 1.25 MG (50000 UT) TABS Take by mouth. ?Patient not taking: Reported on 07/04/2021    [provider]  ?ipratropium-albuterol (DUONEB) 0.5-2.5 (3) MG/3ML SOLN Take 3 mLs by nebulization every 6 (six) hours as needed (shortness of breath). 07/10/20   Loletha Grayer, MD  ?traZODone (DESYREL) 50 MG tablet Take 1 tablet (50 mg total) by mouth at bedtime as needed for sleep. ?Patient not taking: Reported on 06/27/2021 07/10/20   Loletha Grayer, MD  ?VENTOLIN HFA 108 (90 Base) MCG/ACT inhaler Inhale 2 puffs into the lungs every 6 (six) hours as needed for wheezing or shortness of breath.    [provider]  ? ?  ? ?VITAL SIGNS:  ?Blood pressure 128/75, pulse 80, temperature 98 ?F (36.7 ?C), resp. rate 16, SpO2 100 %. ? ?PHYSICAL EXAMINATION:  ?Physical Exam ? ?GENERAL:  88 y.o.-year-old patient lying in the bed with mild respiratory distress with conversational dyspnea.Marland Kitchen  ?EYES: Pupils equal, round, reactive to light and accommodation. No scleral icterus.  Extraocular muscles intact.  ?HEENT: Head atraumatic, normocephalic. Oropharynx and nasopharynx clear.  ?NECK:  Supple, no jugular venous distention. No thyroid enlargement, no tenderness.  ?LUNGS: Norm

## 2021-07-05 DIAGNOSIS — I5033 Acute on chronic diastolic (congestive) heart failure: Secondary | ICD-10-CM | POA: Diagnosis not present

## 2021-07-05 LAB — COMPREHENSIVE METABOLIC PANEL
ALT: 15 U/L (ref 0–44)
AST: 27 U/L (ref 15–41)
Albumin: 3.2 g/dL — ABNORMAL LOW (ref 3.5–5.0)
Alkaline Phosphatase: 86 U/L (ref 38–126)
Anion gap: 4 — ABNORMAL LOW (ref 5–15)
BUN: 28 mg/dL — ABNORMAL HIGH (ref 8–23)
CO2: 30 mmol/L (ref 22–32)
Calcium: 10.2 mg/dL (ref 8.9–10.3)
Chloride: 106 mmol/L (ref 98–111)
Creatinine, Ser: 1.02 mg/dL — ABNORMAL HIGH (ref 0.44–1.00)
GFR, Estimated: 53 mL/min — ABNORMAL LOW (ref >60)
Glucose, Bld: 182 mg/dL — ABNORMAL HIGH (ref 70–99)
Potassium: 4.7 mmol/L (ref 3.5–5.1)
Sodium: 140 mmol/L (ref 135–145)
Total Bilirubin: 0.5 mg/dL (ref 0.3–1.2)
Total Protein: 6.9 g/dL (ref 6.5–8.1)

## 2021-07-05 LAB — CBC WITH DIFFERENTIAL/PLATELET
Abs Immature Granulocytes: 0.01 10*3/uL (ref 0.00–0.07)
Basophils Absolute: 0 10*3/uL (ref 0.0–0.1)
Basophils Relative: 0 %
Eosinophils Absolute: 0 10*3/uL (ref 0.0–0.5)
Eosinophils Relative: 0 %
HCT: 36.6 % (ref 36.0–46.0)
Hemoglobin: 11.4 g/dL — ABNORMAL LOW (ref 12.0–15.0)
Immature Granulocytes: 0 %
Lymphocytes Relative: 34 %
Lymphs Abs: 1.1 10*3/uL (ref 0.7–4.0)
MCH: 25.3 pg — ABNORMAL LOW (ref 26.0–34.0)
MCHC: 31.1 g/dL (ref 30.0–36.0)
MCV: 81.2 fL (ref 80.0–100.0)
Monocytes Absolute: 0.1 10*3/uL (ref 0.1–1.0)
Monocytes Relative: 2 %
Neutro Abs: 2 10*3/uL (ref 1.7–7.7)
Neutrophils Relative %: 64 %
Platelets: 202 10*3/uL (ref 150–400)
RBC: 4.51 MIL/uL (ref 3.87–5.11)
RDW: 17.6 % — ABNORMAL HIGH (ref 11.5–15.5)
WBC: 3.1 10*3/uL — ABNORMAL LOW (ref 4.0–10.5)
nRBC: 0 % (ref 0.0–0.2)

## 2021-07-05 LAB — FERRITIN: Ferritin: 33 ng/mL (ref 11–307)

## 2021-07-05 LAB — C-REACTIVE PROTEIN
CRP: 1.8 mg/dL — ABNORMAL HIGH (ref <1.0)
CRP: 1.9 mg/dL — ABNORMAL HIGH (ref <1.0)

## 2021-07-05 LAB — PROCALCITONIN: Procalcitonin: <0.1 ng/mL

## 2021-07-05 LAB — GLUCOSE, CAPILLARY: Glucose-Capillary: 190 mg/dL — ABNORMAL HIGH (ref 70–99)

## 2021-07-05 LAB — TROPONIN I (HIGH SENSITIVITY): Troponin I (High Sensitivity): 9 ng/L (ref <18)

## 2021-07-05 MED ORDER — METOPROLOL TARTRATE 5 MG/5ML IV SOLN: 5.0000 mg | INTRAVENOUS | Status: DC | PRN

## 2021-07-05 MED ORDER — SENNOSIDES-DOCUSATE SODIUM 8.6-50 MG PO TABS: 1.0000 | ORAL_TABLET | Freq: Every evening | ORAL | Status: DC | PRN

## 2021-07-05 MED ORDER — HYDRALAZINE HCL 20 MG/ML IJ SOLN: 10.0000 mg | INTRAMUSCULAR | Status: DC | PRN

## 2021-07-05 MED ORDER — IPRATROPIUM-ALBUTEROL 20-100 MCG/ACT IN AERS
1.0000 | INHALATION_SPRAY | Freq: Four times a day (QID) | RESPIRATORY_TRACT | Status: DC
  Administered 2021-07-05 – 2021-07-12 (×26): 1 via RESPIRATORY_TRACT
  Filled 2021-07-05 (×3): qty 4

## 2021-07-05 NOTE — ED Notes (Signed)
700 ml urine emptied from canister  ?

## 2021-07-05 NOTE — Progress Notes (Signed)
?PROGRESS NOTE ? ? ? ?[name]  QQI:[id number] DOB: Jul 09, 1930 DOA: 07/04/2021 ?PCP: [facility]  ? ?Brief Narrative:  ?88 year old with history of diastolic CHF, asthma, COPD, GERD, HTN, hypothyroidism admitted for dyspnea, productive cough, nausea and diarrhea which started 3-4 days prior to admission.  Upon admission patient was diagnosed with COVID-19 and CHF exacerbation.  Chest x-ray showed mild pulmonary vascular congestion. ? ? ?Assessment & Plan: ? Principal Problem: ?  Acute on chronic diastolic CHF (congestive heart failure) ([place]) ?Active Problems: ?  COVID-19 virus infection ?  Acute hypoxemic respiratory failure due to COVID-19 [facility]) ?  Hypothyroidism ?  Atrial fibrillation ([place]) ?  Essential hypertension ?  Asthma-COPD overlap syndrome ([place]) ?  ? ? ?Assessment and Plan: ?* Acute on chronic diastolic CHF (congestive heart failure) ([place]) ?- Continue Lasix 40 mg IV twice daily.  Troponins remain flat.  Procalcitonin is negative. ?- She had a 2D echo with EF of 60 to 65% last year. ? ?COVID-19 virus infection ?- The patient will be placed on molnupiravir.  Steroids IV followed by p.o., supportive care. ?As needed bronchodilators ?- She will be placed on mucolytics, zinc sulfate, vitamin C and vitamin D3. ? ?Acute hypoxemic respiratory failure due to COVID-19 [facility]) ?- Combination of CHF and COVID-19.  Supplemental oxygen ? ?Atrial fibrillation ([place]) ?- Toprol-XL, Eliquis ? ?Hypothyroidism ?- Synthroid 150 mcg daily ? ?Asthma-COPD overlap syndrome ([place]) ?- Bronchodilators ? ?Essential hypertension ?- Toprol-XL, IV as needed ordered ? ? ? ? ?DVT prophylaxis: Already on Eliquis ?Code Status: DNI ?Family Communication:   ? ?Status is: Inpatient ?Remains inpatient appropriate because: Maintain hospital stay for IV diuretics and COVID-19 treatment ? ? ? ? ?Subjective: ?Patient feels slightly better after overnight diuresis but still has exertional shortness of breath and  weakness. ? ?Examination: ? ?Constitutional: Not in acute distress, 2 L nasal cannula ?Respiratory: Bibasilar crackles ?Cardiovascular: Normal sinus rhythm, no rubs ?Abdomen: Nontender nondistended good bowel sounds ?Musculoskeletal: 1+ bilateral lower extremity pitting edema ?Skin: No rashes seen ?Neurologic: CN 2-12 grossly intact.  And nonfocal ?Psychiatric: Normal judgment and insight. Alert and oriented x 3. Normal mood.  ? ? ? ? ?Objective: ?Vitals:  ? 07/05/21 0430 07/05/21 0530 07/05/21 0600 07/05/21 0700  ?BP: (!) 144/51 133/70 (!) 141/74 (!) 150/65  ?Pulse: 72 (!) 58 60 82  ?Resp: '12 18 18 19  '$ ?Temp:      ?SpO2: 97% 99% 99% 96%  ? ? ?Intake/Output Summary (Last 24 hours) at 07/05/2021 0801 ?Last data filed at 07/05/2021 0112 ?Gross per 24 hour  ?Intake --  ?Output 2700 ml  ?Net -2700 ml  ? ?There were no vitals filed for this visit. ? ? ?Data Reviewed:  ? ?CBC: ?Recent Labs  ?Lab 06/30/21 ?1708 07/04/21 ?1531 07/05/21 ?0408  ?WBC 6.9 5.0 3.1*  ?NEUTROABS  --   --  2.0  ?HGB 11.0* 11.4* 11.4*  ?HCT 35.8* 37.0 36.6  ?MCV 80.8 81.1 81.2  ?PLT 224 204 202  ? ?Basic Metabolic Panel: ?Recent Labs  ?Lab 06/30/21 ?1708 07/04/21 ?1531 07/05/21 ?0408  ?NA 132* 135 140  ?K 3.8 3.9 4.7  ?CL 100 106 106  ?CO2 '25 26 30  '$ ?GLUCOSE 109* 113* 182*  ?BUN 21 26* 28*  ?CREATININE 0.89 1.09* 1.02*  ?CALCIUM 9.8 9.9 10.2  ? ?GFR: ?Estimated Creatinine Clearance: 50.9 mL/min (A) (by C-G formula based on SCr of 1.02 mg/dL (H)). ?Liver Function Tests: ?Recent Labs  ?Lab 06/30/21 ?1708 07/05/21 ?0408  ?AST 23 27  ?  ALT 12 15  ?ALKPHOS 89 86  ?BILITOT 0.7 0.5  ?PROT 7.1 6.9  ?ALBUMIN 3.4* 3.2*  ? ?No results for input(s): LIPASE, AMYLASE in the last 168 hours. ?No results for input(s): AMMONIA in the last 168 hours. ?Coagulation Profile: ?No results for input(s): INR, PROTIME in the last 168 hours. ?Cardiac Enzymes: ?No results for input(s): CKTOTAL, CKMB, CKMBINDEX, TROPONINI in the last 168 hours. ?BNP (last 3 results) ?No results for  input(s): PROBNP in the last 8760 hours. ?HbA1C: ?No results for input(s): HGBA1C in the last 72 hours. ?CBG: ?No results for input(s): GLUCAP in the last 168 hours. ?Lipid Profile: ?No results for input(s): CHOL, HDL, LDLCALC, TRIG, CHOLHDL, LDLDIRECT in the last 72 hours. ?Thyroid Function Tests: ?No results for input(s): TSH, T4TOTAL, FREET4, T3FREE, THYROIDAB in the last 72 hours. ?Anemia Panel: ?Recent Labs  ?  07/04/21 ?2241 07/05/21 ?0408  ?FERRITIN 30 33  ? ?Sepsis Labs: ?Recent Labs  ?Lab 07/04/21 ?2241  ?PROCALCITON <0.10  ? ? ?Recent Results (from the past 240 hour(s))  ?Resp Panel by RT-PCR (Flu A&B, Covid) Nasopharyngeal Swab     Status: Abnormal  ? Collection Time: 07/04/21  3:47 PM  ? Specimen: Nasopharyngeal Swab; Nasopharyngeal(NP) swabs in vial transport medium  ?Result Value Ref Range Status  ? SARS Coronavirus 2 by RT PCR POSITIVE (A) NEGATIVE Final  ?  Comment: (NOTE) ?SARS-CoV-2 target nucleic acids are DETECTED. ? ?The SARS-CoV-2 RNA is generally detectable in upper respiratory ?specimens during the acute phase of infection. Positive results are ?indicative of the presence of the identified virus, but do not rule ?out bacterial infection or co-infection with other pathogens not ?detected by the test. Clinical correlation with patient history and ?other diagnostic information is necessary to determine patient ?infection status. The expected result is Negative. ? ?Fact Sheet for Patients: ?EntrepreneurPulse.com.au ? ?Fact Sheet for Healthcare Providers: ?IncredibleEmployment.be ? ?This test is not yet approved or cleared by the [place] FDA and  ?has been authorized for detection and/or diagnosis of SARS-CoV-2 by ?FDA under an Emergency Use Authorization (EUA).  This EUA will ?remain in effect (meaning this test can be used) for the duration of  ?the COVID-19 declaration under Section 564(b)(1) of the A ct, 21 ?U.S.C. section 360bbb-3(b)(1), unless the  authorization is ?terminated or revoked sooner. ? ?  ? Influenza A by PCR NEGATIVE NEGATIVE Final  ? Influenza B by PCR NEGATIVE NEGATIVE Final  ?  Comment: (NOTE) ?The Xpert Xpress SARS-CoV-2/FLU/RSV plus assay is intended as an aid ?in the diagnosis of influenza from Nasopharyngeal swab specimens and ?should not be used as a sole basis for treatment. Nasal washings and ?aspirates are unacceptable for Xpert Xpress SARS-CoV-2/FLU/RSV ?testing. ? ?Fact Sheet for Patients: ?EntrepreneurPulse.com.au ? ?Fact Sheet for Healthcare Providers: ?IncredibleEmployment.be ? ?This test is not yet approved or cleared by the [place] FDA and ?has been authorized for detection and/or diagnosis of SARS-CoV-2 by ?FDA under an Emergency Use Authorization (EUA). This EUA will remain ?in effect (meaning this test can be used) for the duration of the ?COVID-19 declaration under Section 564(b)(1) of the Act, 21 U.S.C. ?section 360bbb-3(b)(1), unless the authorization is terminated or ?revoked. ? ?Performed at [facility], [place], ?[place] [address] ?  ?  ? ? ? ? ? ?Radiology Studies: ?DG Chest 2 View ? ?Result Date: 07/04/2021 ?CLINICAL DATA:  Short of breath EXAM: CHEST - 2 VIEW COMPARISON:  06/30/2021 FINDINGS: Prominent right heart border similar to the prior study.  Mild vascular congestion. No edema. Mild left lower lobe atelectasis similar. No effusion. IMPRESSION: Mild vascular congestion without edema Mild left lower lobe atelectasis. Electronically Signed   By: Franchot Gallo M.D.   On: 07/04/2021 16:31   ? ? ? ? ? ?Scheduled Meds: ? apixaban  5 mg Oral BID  ? vitamin C  500 mg Oral Daily  ? cholecalciferol  1,000 Units Oral Daily  ? diclofenac Sodium  2 g Topical QID  ? DULoxetine  60 mg Oral Daily  ? famotidine  20 mg Oral QHS  ? furosemide  40 mg Intravenous Q12H  ? hydrOXYzine  10 mg Oral BID  ? levothyroxine  150 mcg Oral Daily  ? loratadine  10 mg Oral Daily   ? metoprolol succinate  12.5 mg Oral QHS  ? molnupiravir EUA  4 capsule Oral BID  ? montelukast  10 mg Oral Daily  ? [START ON 07/08/2021] predniSONE  50 mg Oral Daily  ? vitamin B-12  500 mcg Oral Daily  ? [START ON 5/13/202

## 2021-07-05 NOTE — ED Notes (Signed)
Lunch tray found to be cold not near pt room, dietary called twice to redeliver a tray and to notify when tray is delivered.  ?

## 2021-07-05 NOTE — ED Notes (Signed)
Pt pure wick cannister emptied. 900 ml urine noted in cannister ?

## 2021-07-05 NOTE — ED Notes (Signed)
Dr. Amin at bedside.

## 2021-07-05 NOTE — ED Notes (Signed)
Informed RN bed assigned 

## 2021-07-05 NOTE — ED Notes (Signed)
Pt found to be asleep and had taken off all monitoring equipment including oxygen, pt is 96% on RA, will leave pt off supplemental oxygen at this time.  ?

## 2021-07-05 NOTE — ED Notes (Signed)
Pt resting, pt encouraged to eat her meal. Pt states she is just sleepy. Pt does wake up to light name but does appear to be sleepy.  ?

## 2021-07-05 NOTE — ED Notes (Signed)
Breakfast tray placed in room next to pt ?

## 2021-07-06 ENCOUNTER — Encounter: Payer: Self-pay | Admitting: Family Medicine

## 2021-07-06 DIAGNOSIS — I5033 Acute on chronic diastolic (congestive) heart failure: Secondary | ICD-10-CM | POA: Diagnosis not present

## 2021-07-06 LAB — CBC WITH DIFFERENTIAL/PLATELET
Abs Immature Granulocytes: 0.01 10*3/uL (ref 0.00–0.07)
Basophils Absolute: 0 10*3/uL (ref 0.0–0.1)
Basophils Relative: 0 %
Eosinophils Absolute: 0 10*3/uL (ref 0.0–0.5)
Eosinophils Relative: 0 %
HCT: 37.6 % (ref 36.0–46.0)
Hemoglobin: 11.7 g/dL — ABNORMAL LOW (ref 12.0–15.0)
Immature Granulocytes: 0 %
Lymphocytes Relative: 20 %
Lymphs Abs: 1.4 10*3/uL (ref 0.7–4.0)
MCH: 24.6 pg — ABNORMAL LOW (ref 26.0–34.0)
MCHC: 31.1 g/dL (ref 30.0–36.0)
MCV: 79.2 fL — ABNORMAL LOW (ref 80.0–100.0)
Monocytes Absolute: 0.3 10*3/uL (ref 0.1–1.0)
Monocytes Relative: 4 %
Neutro Abs: 5.4 10*3/uL (ref 1.7–7.7)
Neutrophils Relative %: 76 %
Platelets: 246 10*3/uL (ref 150–400)
RBC: 4.75 MIL/uL (ref 3.87–5.11)
RDW: 17.8 % — ABNORMAL HIGH (ref 11.5–15.5)
WBC: 7.1 10*3/uL (ref 4.0–10.5)
nRBC: 0 % (ref 0.0–0.2)

## 2021-07-06 LAB — COMPREHENSIVE METABOLIC PANEL
ALT: 16 U/L (ref 0–44)
AST: 26 U/L (ref 15–41)
Albumin: 3.2 g/dL — ABNORMAL LOW (ref 3.5–5.0)
Alkaline Phosphatase: 82 U/L (ref 38–126)
Anion gap: 5 (ref 5–15)
BUN: 31 mg/dL — ABNORMAL HIGH (ref 8–23)
CO2: 30 mmol/L (ref 22–32)
Calcium: 10.4 mg/dL — ABNORMAL HIGH (ref 8.9–10.3)
Chloride: 104 mmol/L (ref 98–111)
Creatinine, Ser: 0.93 mg/dL (ref 0.44–1.00)
GFR, Estimated: 59 mL/min — ABNORMAL LOW (ref >60)
Glucose, Bld: 153 mg/dL — ABNORMAL HIGH (ref 70–99)
Potassium: 4.3 mmol/L (ref 3.5–5.1)
Sodium: 139 mmol/L (ref 135–145)
Total Bilirubin: 0.2 mg/dL — ABNORMAL LOW (ref 0.3–1.2)
Total Protein: 7 g/dL (ref 6.5–8.1)

## 2021-07-06 LAB — FERRITIN: Ferritin: 36 ng/mL (ref 11–307)

## 2021-07-06 LAB — MAGNESIUM: Magnesium: 2.1 mg/dL (ref 1.7–2.4)

## 2021-07-06 LAB — C-REACTIVE PROTEIN: CRP: 0.8 mg/dL (ref <1.0)

## 2021-07-06 LAB — TROPONIN I (HIGH SENSITIVITY): Troponin I (High Sensitivity): 12 ng/L (ref <18)

## 2021-07-06 MED ORDER — PREDNISONE 50 MG PO TABS
50.0000 mg | ORAL_TABLET | Freq: Every day | ORAL | Status: AC
  Administered 2021-07-07 – 2021-07-09 (×3): 50 mg via ORAL
  Filled 2021-07-06 (×3): qty 1

## 2021-07-06 NOTE — Evaluation (Signed)
Occupational Therapy Evaluation ?Patient Details ?Name: [name] ?MRN: [id number] ?DOB: 1930-11-08 ?Today's Date: 07/06/2021 ? ? ?History of Present Illness Pt is an 88 y/o F admitted on 07/04/21 for tx of dyspnea, productive cough, nausea & diarrhea, which started 3-4 days prior to admission. Pt was diagnosed with Covid-19 & COPD exacerbation. PMH: dCHF, asthma, COPD, GERD, HTN, hypothyroidism  ? ?Clinical Impression ?  ?Chart reviewed, pt greeted in room with PT present. Pt reports she does not get out of be much however appears to be moving extremities appropriately for mobility. PTA pt reports she requires assist for ADL/IADL, per chart pt is from ALF. Pt performs supine>sit with supervision with HOB raised, STS with MIN A, amb to bathroom with supervision with RW. CGA for toilet transfer with RW and BSC,  MAX A required for peri care. MIN A required for UB bathing. Pt presents with deficits in strength, endurance in setting of acute illness, would benefit from [facility] following discharge. OT will continue to follow acutely. PT is left in bedsdie chair, NAD, all needs met. SPO2 >90% on RA throughout.  ?   ? ?Recommendations for follow up therapy are one component of a multi-disciplinary discharge planning process, led by the attending physician.  Recommendations may be updated based on patient status, additional functional criteria and insurance authorization.  ? ?Follow Up Recommendations ? Home health OT  ?  ?Assistance Recommended at Discharge Frequent or constant Supervision/Assistance  ?Patient can return home with the following A little help with walking and/or transfers;A little help with bathing/dressing/bathroom;Assistance with cooking/housework;Help with stairs or ramp for entrance;Assist for transportation;Direct supervision/assist for medications management;Direct supervision/assist for financial management ? ?  ?Functional Status Assessment ? Patient has had a recent decline in their functional  status and demonstrates the ability to make significant improvements in function in a reasonable and predictable amount of time.  ?Equipment Recommendations ? BSC/3in1;Tub/shower seat;Other (comment) (if pt does not already own items)  ?  ?Recommendations for Other Services   ? ? ?  ?Precautions / Restrictions Precautions ?Precautions: Fall ?Restrictions ?Weight Bearing Restrictions: No ?Other Position/Activity Restrictions: Pt is an 88 y/o F admitted on 07/04/21 for tx of dyspnea, productive cough, nausea & diarrhea, which started 3-4 days prior to admission. Pt was diagnosed with Covid-19 & COPD exacerbation. PMH: dCHF, asthma, COPD, GERD, HTN, hypothyroidism  ? ?  ? ?Mobility Bed Mobility ?Overal bed mobility: Needs Assistance ?Bed Mobility: Supine to Sit ?  ?  ?Supine to sit: Supervision, HOB elevated ?  ?  ?General bed mobility comments: bed rails ?  ? ?Transfers ?Overall transfer level: Needs assistance ?Equipment used: Rolling walker (2 wheels) ?Transfers: Sit to/from Stand ?Sit to Stand: Min assist ?  ?  ?  ?  ?  ?  ?  ? ?  ?Balance Overall balance assessment: Needs assistance ?Sitting-balance support: Feet supported, Bilateral upper extremity supported ?Sitting balance-Leahy Scale: Fair ?  ?  ?Standing balance support: Bilateral upper extremity supported, During functional activity ?Standing balance-Leahy Scale: Fair ?  ?  ?  ?  ?  ?  ?  ?  ?  ?  ?  ?  ?   ? ?ADL either performed or assessed with clinical judgement  ? ?ADL Overall ADL's : Needs assistance/impaired ?Eating/Feeding: Sitting;Set up ?  ?  ?  ?Upper Body Bathing: Minimal assistance;Sitting ?Upper Body Bathing Details (indicate cue type and reason): for thoroughness ?Lower Body Bathing: Maximal assistance;Sit to/from stand ?  ?Upper Body Dressing : Minimal  assistance;Sitting ?  ?Lower Body Dressing: Maximal assistance ?Lower Body Dressing Details (indicate cue type and reason): socks  bed level ?Toilet Transfer:  Supervision/safety;BSC/3in1;Ambulation;Min guard;Rolling walker (2 wheels) ?  ?Toileting- Clothing Manipulation and Hygiene: Maximal assistance;Sit to/from stand ?Toileting - Clothing Manipulation Details (indicate cue type and reason): peri care ?  ?  ?Functional mobility during ADLs: Supervision/safety;Rolling walker (2 wheels) (chair to bathroom with rest, back to chair) ?   ? ? ? ?Vision Patient Visual Report: No change from baseline ?Additional Comments: will continue to assess  ?   ?Perception   ?  ?Praxis   ?  ? ?Pertinent Vitals/Pain Pain Assessment ?Pain Assessment: No/denies pain  ? ? ? ?Hand Dominance   ?  ?Extremity/Trunk Assessment Upper Extremity Assessment ?Upper Extremity Assessment: Generalized weakness ?  ?Lower Extremity Assessment ?Lower Extremity Assessment: Generalized weakness ?  ?  ?  ?Communication Communication ?Communication: HOH ?  ?Cognition Arousal/Alertness: Awake/alert ?Behavior During Therapy: [facility] for tasks assessed/performed ?Overall Cognitive Status: No family/caregiver present to determine baseline cognitive functioning ?Area of Impairment: Attention, Memory, Following commands, Awareness, Problem solving ?  ?  ?  ?  ?  ?  ?  ?  ?  ?Current Attention Level: Sustained ?Memory: Decreased short-term memory ?Following Commands: Follows one step commands with increased time ?  ?Awareness: Emergent ?Problem Solving: Requires verbal cues, Requires tactile cues ?  ?  ?  ?General Comments  SPO2 >90% on RA throughout evaluation; redness and edema noted to BLE, RN aware; small open sores noted above B clavicles and down B arms, pt reports they are boils; RN aware ? ?  ?Exercises   ?  ?Shoulder Instructions    ? ? ?Home Living Family/patient expects to be discharged to:: Assisted living ?  ?  ?  ?  ?  ?  ?  ?  ?  ?  ?  ?  ?  ?  ?Home Equipment: Rollator (4 wheels) ?  ?Additional Comments: per chart reivew, shower chair; pt is poor historian. ?  ? ?  ?Prior Functioning/Environment   ?  ?  ?  ?   ?  ?  ?Mobility Comments: Pt reports she remains in bed, but then also states she amb to bathroom with rollator or use of mwc ?ADLs Comments: Pt reports she requires assist for all ADL/IADL ?  ? ?  ?  ?OT Problem List: Decreased strength;Decreased activity tolerance;Impaired balance (sitting and/or standing) ?  ?   ?OT Treatment/Interventions: Self-care/ADL training;Therapeutic exercise;Energy conservation;DME and/or AE instruction;Therapeutic activities;Balance training;Patient/family education  ?  ?OT Goals(Current goals can be found in the care plan section) Acute Rehab OT Goals ?Patient Stated Goal: go home ?OT Goal Formulation: With patient ?Time For Goal Achievement: 07/20/21 ?Potential to Achieve Goals: Good ?ADL Goals ?Pt Will Perform Grooming: with supervision;standing;sitting ?Pt Will Perform Upper Body Dressing: with supervision;sitting ?Pt Will Perform Lower Body Dressing: with supervision;sit to/from stand;sitting/lateral leans ?Pt Will Transfer to Toilet: with modified independence;ambulating ?Pt Will Perform Toileting - Clothing Manipulation and hygiene: with modified independence;sit to/from stand  ?OT Frequency: Min 2X/week ?  ? ?Co-evaluation   ?Reason for Co-Treatment: Other (comment) (pt initially reports minimal OOB mobility at baseline; utilized +2 in anticipation pt would require more assistance than she actually did) ?  ?  ?  ? ?  ?AM-PAC OT "6 Clicks" Daily Activity     ?Outcome Measure Help from another person eating meals?: None ?Help from another person taking care of personal grooming?: A Little ?Help  from another person toileting, which includes using toliet, bedpan, or urinal?: A Lot ?Help from another person bathing (including washing, rinsing, drying)?: A Lot ?Help from another person to put on and taking off regular upper body clothing?: A Little ?Help from another person to put on and taking off regular lower body clothing?: A Lot ?6 Click Score: 16 ?  ?End of Session Equipment  Utilized During Treatment: Rolling walker (2 wheels) ?Nurse Communication: Mobility status ? ?Activity Tolerance: Patient tolerated treatment well ?Patient left: in chair;with call bell/phone within reach;with chair alarm set ? ?OT Visit Dia

## 2021-07-06 NOTE — Progress Notes (Signed)
?PROGRESS NOTE ? ? ? ?[name]  QBH:[id number] DOB: 1930/06/30 DOA: 07/04/2021 ?PCP: [facility]  ? ?Brief Narrative:  ?88 year old with history of diastolic CHF, asthma, COPD, GERD, HTN, hypothyroidism admitted for dyspnea, productive cough, nausea and diarrhea which started 3-4 days prior to admission.  Upon admission patient was diagnosed with COVID-19 and CHF exacerbation.  Chest x-ray showed mild pulmonary vascular congestion.  She was started on steroids, bronchodilators, IV Lasix. ? ? ?Assessment & Plan: ? Principal Problem: ?  Acute on chronic diastolic CHF (congestive heart failure) ([place]) ?Active Problems: ?  COVID-19 virus infection ?  Acute hypoxemic respiratory failure due to COVID-19 [facility]) ?  Hypothyroidism ?  Atrial fibrillation ([place]) ?  Essential hypertension ?  Asthma-COPD overlap syndrome ([place]) ?  ? ? ?Assessment and Plan: ?* Acute on chronic diastolic CHF (congestive heart failure) ([place]), class III ?-ContinueLasix 40 mg IV twice daily.  Troponins remain flat.  Procalcitonin is negative.  Although on room air she is still significantly volume overloaded ?- She had a 2D echo with EF of 60 to 65% last year. ?- Repeat echocardiogram - pending.  ? ?COVID-19 virus infection ?- The patient will be placed on molnupiravir.  Prednisone, supportive care.  As needed bronchodilators ?- She will be placed on mucolytics, zinc sulfate, vitamin C and vitamin D3. ? ?Acute hypoxemic respiratory failure due to COVID-19 [facility]) ?- Combination of CHF and COVID-19.  Supplemental oxygen ? ?Atrial fibrillation ([place]) ?- Toprol-XL, Eliquis ? ?Hypothyroidism ?- Synthroid 150 mcg daily ? ?Asthma-COPD overlap syndrome ([place]) ?- Bronchodilators ? ?Essential hypertension ?- Toprol-XL, IV as needed ordered ? ? ?PT/OT- pending.  ? ?DVT prophylaxis: Already on Eliquis ?Code Status: DNI ?Family Communication:  Daughter updated.  ? ?Status is: Inpatient ?Remains inpatient appropriate because: Has significant lower  extremity swelling causing some ambulatory difficulty requiring IV diuretics.  Maintain hospital stay ? ?Subjective: ?Exertional SOB, unsteady gait. No Chest pain or SOB.  ?Had some confusion overnight.  ? ?Examination: ?Constitutional: Not in acute distress ?Respiratory: bibasilar crackles.  ?Cardiovascular: Normal sinus rhythm, no rubs ?Abdomen: Nontender nondistended good bowel sounds ?Musculoskeletal: 3+ LE pitting  ?Skin: No rashes seen ?Neurologic: CN 2-12 grossly intact.  And nonfocal ?Psychiatric: Poor judgement. AAOx 1-2 ? ?Objective: ?Vitals:  ? 07/05/21 1700 07/05/21 2232 07/05/21 2239 07/06/21 0400  ?BP:  127/71 127/71 (!) 145/68  ?Pulse:  66 66   ?Resp:   18 14  ?Temp:   97.8 ?F (36.6 ?C) 98 ?F (36.7 ?C)  ?TempSrc:   Oral Oral  ?SpO2:   97% 92%  ?Weight: 116 kg     ?Height: '5\' 4"'$  (1.626 m)     ? ? ?Intake/Output Summary (Last 24 hours) at 07/06/2021 0756 ?Last data filed at 07/05/2021 1800 ?Gross per 24 hour  ?Intake 360 ml  ?Output 350 ml  ?Net 10 ml  ? ?Filed Weights  ? 07/05/21 1700  ?Weight: 116 kg  ? ? ? ?Data Reviewed:  ? ?CBC: ?Recent Labs  ?Lab 06/30/21 ?1708 07/04/21 ?1531 07/05/21 ?0408 07/06/21 ?0700  ?WBC 6.9 5.0 3.1* 7.1  ?NEUTROABS  --   --  2.0 5.4  ?HGB 11.0* 11.4* 11.4* 11.7*  ?HCT 35.8* 37.0 36.6 37.6  ?MCV 80.8 81.1 81.2 79.2*  ?PLT 224 204 202 246  ? ?Basic Metabolic Panel: ?Recent Labs  ?Lab 06/30/21 ?1708 07/04/21 ?1531 07/05/21 ?0408 07/06/21 ?0700  ?NA 132* 135 140 139  ?K 3.8 3.9 4.7 4.3  ?CL 100 106 106 104  ?CO2 25  $'26 30 30  'l$ ?GLUCOSE 109* 113* 182* 153*  ?BUN 21 26* 28* 31*  ?CREATININE 0.89 1.09* 1.02* 0.93  ?CALCIUM 9.8 9.9 10.2 10.4*  ?MG  --   --   --  2.1  ? ?GFR: ?Estimated Creatinine Clearance: 52.3 mL/min (by C-G formula based on SCr of 0.93 mg/dL). ?Liver Function Tests: ?Recent Labs  ?Lab 06/30/21 ?1708 07/05/21 ?0408 07/06/21 ?0700  ?AST '23 27 26  '$ ?ALT '12 15 16  '$ ?ALKPHOS 89 86 82  ?BILITOT 0.7 0.5 0.2*  ?PROT 7.1 6.9 7.0  ?ALBUMIN 3.4* 3.2* 3.2*  ? ?No results for  input(s): LIPASE, AMYLASE in the last 168 hours. ?No results for input(s): AMMONIA in the last 168 hours. ?Coagulation Profile: ?No results for input(s): INR, PROTIME in the last 168 hours. ?Cardiac Enzymes: ?No results for input(s): CKTOTAL, CKMB, CKMBINDEX, TROPONINI in the last 168 hours. ?BNP (last 3 results) ?No results for input(s): PROBNP in the last 8760 hours. ?HbA1C: ?No results for input(s): HGBA1C in the last 72 hours. ?CBG: ?Recent Labs  ?Lab 07/05/21 ?2157  ?GLUCAP 190*  ? ?Lipid Profile: ?No results for input(s): CHOL, HDL, LDLCALC, TRIG, CHOLHDL, LDLDIRECT in the last 72 hours. ?Thyroid Function Tests: ?No results for input(s): TSH, T4TOTAL, FREET4, T3FREE, THYROIDAB in the last 72 hours. ?Anemia Panel: ?Recent Labs  ?  07/05/21 ?0408 07/06/21 ?0700  ?FERRITIN 33 36  ? ?Sepsis Labs: ?Recent Labs  ?Lab 07/04/21 ?2241  ?PROCALCITON <0.10  ? ? ?Recent Results (from the past 240 hour(s))  ?Resp Panel by RT-PCR (Flu A&B, Covid) Nasopharyngeal Swab     Status: Abnormal  ? Collection Time: 07/04/21  3:47 PM  ? Specimen: Nasopharyngeal Swab; Nasopharyngeal(NP) swabs in vial transport medium  ?Result Value Ref Range Status  ? SARS Coronavirus 2 by RT PCR POSITIVE (A) NEGATIVE Final  ?  Comment: (NOTE) ?SARS-CoV-2 target nucleic acids are DETECTED. ? ?The SARS-CoV-2 RNA is generally detectable in upper respiratory ?specimens during the acute phase of infection. Positive results are ?indicative of the presence of the identified virus, but do not rule ?out bacterial infection or co-infection with other pathogens not ?detected by the test. Clinical correlation with patient history and ?other diagnostic information is necessary to determine patient ?infection status. The expected result is Negative. ? ?Fact Sheet for Patients: ?EntrepreneurPulse.com.au ? ?Fact Sheet for Healthcare Providers: ?IncredibleEmployment.be ? ?This test is not yet approved or cleared by the [place]  FDA and  ?has been authorized for detection and/or diagnosis of SARS-CoV-2 by ?FDA under an Emergency Use Authorization (EUA).  This EUA will ?remain in effect (meaning this test can be used) for the duration of  ?the COVID-19 declaration under Section 564(b)(1) of the [place] ct, 21 ?U.S.C. section 360bbb-3(b)(1), unless the authorization is ?terminated or revoked sooner. ? ?  ? Influenza A by PCR NEGATIVE NEGATIVE Final  ? Influenza B by PCR NEGATIVE NEGATIVE Final  ?  Comment: (NOTE) ?The Xpert Xpress SARS-CoV-2/FLU/RSV plus assay is intended as an aid ?in the diagnosis of influenza from Nasopharyngeal swab specimens and ?should not be used as a sole basis for treatment. Nasal washings and ?aspirates are unacceptable for Xpert Xpress SARS-CoV-2/FLU/RSV ?testing. ? ?Fact Sheet for Patients: ?EntrepreneurPulse.com.au ? ?Fact Sheet for Healthcare Providers: ?IncredibleEmployment.be ? ?This test is not yet approved or cleared by the [place] FDA and ?has been authorized for detection and/or diagnosis of SARS-CoV-2 by ?FDA under an Emergency Use Authorization (EUA). This EUA will remain ?in effect (meaning this test can be  used) for the duration of the ?COVID-19 declaration under Section 564(b)(1) of the Act, 21 U.S.C. ?section 360bbb-3(b)(1), unless the authorization is terminated or ?revoked. ? ?Performed at [facility], [place], ?[place] [address] ?  ?  ? ? ? ? ? ?Radiology Studies: ?DG Chest 2 View ? ?Result Date: 07/04/2021 ?CLINICAL DATA:  Short of breath EXAM: CHEST - 2 VIEW COMPARISON:  06/30/2021 FINDINGS: Prominent right heart border similar to the prior study. Mild vascular congestion. No edema. Mild left lower lobe atelectasis similar. No effusion. IMPRESSION: Mild vascular congestion without edema Mild left lower lobe atelectasis. Electronically Signed   By: Franchot Gallo M.D.   On: 07/04/2021 16:31   ? ? ? ? ? ?Scheduled Meds: ? apixaban  5 mg  Oral BID  ? vitamin C  500 mg Oral Daily  ? cholecalciferol  1,000 Units Oral Daily  ? diclofenac Sodium  2 g Topical QID  ? DULoxetine  60 mg Oral Daily  ? famotidine  20 mg Oral QHS  ? furosemide  40 mg I

## 2021-07-06 NOTE — Evaluation (Signed)
Physical Therapy Evaluation ?Patient Details ?Name: [name] ?MRN: [id number] ?DOB: [birth year] ?Today's Date: 07/06/2021 ? ?History of Present Illness ? Pt is an 88 y/o F admitted on 07/04/21 for tx of dyspnea, productive cough, nausea & diarrhea, which started 3-4 days prior to admission. Pt was diagnosed with Covid-19 & COPD exacerbation. PMH: dCHF, asthma, COPD, GERD, HTN, hypothyroidism  ?Clinical Impression ? Pt seen for PT evaluation with pt initially reporting minimal OOB mobility at baseline but later states she ambulates with a rollator. On this date, pt is able to complete bed mobility with supervision with reliance on hospital bed features. Pt requires min assist +2 for initial STS from EOB but is able to ambulate to bathroom & back with RW & min assist. Pt does require rest breaks 2/2 fatigue & SOB but SpO2 >90% on room air throughout. Will continue to follow pt acutely to address endurance, balance, and gait with LRAD. ?  ? ?Recommendations for follow up therapy are one component of a multi-disciplinary discharge planning process, led by the attending physician.  Recommendations may be updated based on patient status, additional functional criteria and insurance authorization. ? ?Follow Up Recommendations Home health PT ? ?  ?Assistance Recommended at Discharge Frequent or constant Supervision/Assistance  ?Patient can return home with the following ? A little help with walking and/or transfers;A little help with bathing/dressing/bathroom ? ?  ?Equipment Recommendations None recommended by PT  ?Recommendations for Other Services ?    ?  ?Functional Status Assessment Patient has had a recent decline in their functional status and demonstrates the ability to make significant improvements in function in a reasonable and predictable amount of time.  ? ?  ?Precautions / Restrictions Precautions ?Precautions: Fall ?Restrictions ?Other Position/Activity Restrictions: Pt is an 88 y/o F admitted on 07/04/21 for  tx of dyspnea, productive cough, nausea & diarrhea, which started 3-4 days prior to admission. Pt was diagnosed with Covid-19 & COPD exacerbation. PMH: dCHF, asthma, COPD, GERD, HTN, hypothyroidism  ? ?  ? ?Mobility ? Bed Mobility ?Overal bed mobility: Needs Assistance ?Bed Mobility: Supine to Sit ?  ?  ?Supine to sit: Supervision, HOB elevated ?  ?  ?General bed mobility comments: use of bed rails ?  ? ?Transfers ?Overall transfer level: Needs assistance ?Equipment used: Rolling walker (2 wheels) ?Transfers: Sit to/from Stand ?Sit to Stand: Min assist ?  ?  ?  ?  ?  ?General transfer comment: min cuing for safe hand placement, min assist +2 to boost to standing from EOB, +1 assist to stand from toilet ?  ? ?Ambulation/Gait ?Ambulation/Gait assistance: Min assist ?Gait Distance (Feet): 25 Feet (+ 25 ft) ?Assistive device: Rolling walker (2 wheels) ?Gait Pattern/deviations: Decreased step length - left, Decreased step length - right, Decreased stride length, Trunk flexed ?Gait velocity: decreased ?  ?  ?General Gait Details: pushses RW slightly out in front of her ? ?Stairs ?  ?  ?  ?  ?  ? ?Wheelchair Mobility ?  ? ?Modified Rankin (Stroke Patients Only) ?  ? ?  ? ?Balance Overall balance assessment: Needs assistance ?Sitting-balance support: Feet supported, Bilateral upper extremity supported ?Sitting balance-Leahy Scale: Fair ?Sitting balance - Comments: supervision static sitting ?  ?Standing balance support: Bilateral upper extremity supported, During functional activity ?Standing balance-Leahy Scale: Fair ?  ?  ?  ?  ?  ?  ?  ?  ?  ?  ?  ?  ?   ? ? ? ?Pertinent Vitals/Pain Pain  Assessment ?Pain Assessment: No/denies pain  ? ? ?Home Living Family/patient expects to be discharged to:: Assisted living ?  ?  ?  ?  ?  ?  ?  ?  ?Home Equipment: Rollator (4 wheels) ?   ?  ?Prior Function   ?  ?  ?  ?  ?  ?  ?Mobility Comments: Pt is a poor historian, reports she doesn't get OOB much & when she does she has assistance  to ambulate to w/c but then states she ambulates with rollator ?  ?  ? ? ?Hand Dominance  ?   ? ?  ?Extremity/Trunk Assessment  ? Upper Extremity Assessment ?Upper Extremity Assessment: Generalized weakness (open sores on B shoulders - nurse notified but already aware) ?  ? ?Lower Extremity Assessment ?Lower Extremity Assessment: Generalized weakness (redness noted to BLE ankles -  nurse notified but already aware) ?  ? ?   ?Communication  ? Communication: HOH  ?Cognition Arousal/Alertness: Awake/alert ?Behavior During Therapy: [facility] for tasks assessed/performed ?Overall Cognitive Status: No family/caregiver present to determine baseline cognitive functioning ?  ?  ?  ?  ?  ?  ?  ?  ?  ?  ?  ?  ?  ?  ?  ?  ?General Comments: Pt appears to have impaired short term memory & also states "they think I have the beginnings of dementia" ?  ?  ? ?  ?General Comments General comments (skin integrity, edema, etc.): Pt on room air, SpO2 >90% throughout, educated pt on pursed lip breathing when c/o SOB with mobiltiy ? ?  ?Exercises    ? ?Assessment/Plan  ?  ?PT Assessment Patient needs continued PT services  ?PT Problem List Decreased strength;Decreased mobility;Decreased safety awareness;Decreased activity tolerance;Decreased cognition;Cardiopulmonary status limiting activity;Decreased knowledge of use of DME;Decreased balance ? ?   ?  ?PT Treatment Interventions DME instruction;Therapeutic activities;Modalities;Cognitive remediation;Gait training;Therapeutic exercise;Patient/family education;Balance training;Functional mobility training;Neuromuscular re-education;Manual techniques   ? ?PT Goals (Current goals can be found in the Care Plan section)  ?Acute Rehab PT Goals ?Patient Stated Goal: get better ?PT Goal Formulation: With patient ?Time For Goal Achievement: 07/20/21 ?Potential to Achieve Goals: Good ? ?  ?Frequency Min 2X/week ?  ? ? ?Co-evaluation PT/OT/SLP Co-Evaluation/Treatment: Yes ?Reason for Co-Treatment: Other  (comment) (pt initially reports minimal OOB mobility at baseline; utilized +2 in anticipation pt would require more assistance than she actually did) ?  ?  ?  ? ? ?  ?AM-PAC PT "6 Clicks" Mobility  ?Outcome Measure Help needed turning from your back to your side while in a flat bed without using bedrails?: A Little ?Help needed moving from lying on your back to sitting on the side of a flat bed without using bedrails?: A Little ?Help needed moving to and from a bed to a chair (including a wheelchair)?: A Little ?Help needed standing up from a chair using your arms (e.g., wheelchair or bedside chair)?: A Little ?Help needed to walk in hospital room?: A Little ?Help needed climbing 3-5 steps with a railing? : A Lot ?6 Click Score: 17 ? ?  ?End of Session   ?Activity Tolerance: Patient tolerated treatment well ?Patient left: in chair;with chair alarm set;with call bell/phone within reach ?Nurse Communication: Mobility status ?PT Visit Diagnosis: Muscle weakness (generalized) (M62.81);Unsteadiness on feet (R26.81) ?  ? ?Time: 1328-1400 ?PT Time Calculation (min) (ACUTE ONLY): 32 min ? ? ?Charges:   PT Evaluation ?$PT Eval Low Complexity: 1 Low ?  ?  ?   ? ? ?  Lavone Nian, PT, DPT ?07/06/21, 2:22 PM ? ? ?Waunita Schooner ?07/06/2021, 2:20 PM ? ?

## 2021-07-07 ENCOUNTER — Inpatient Hospital Stay: Payer: Medicare HMO

## 2021-07-07 ENCOUNTER — Inpatient Hospital Stay (HOSPITAL_COMMUNITY)
Admit: 2021-07-07 | Discharge: 2021-07-07 | Disposition: A | Discharge status: Home / Self Care | Payer: Medicare HMO | Attending: Internal Medicine | Admitting: Internal Medicine

## 2021-07-07 DIAGNOSIS — I5033 Acute on chronic diastolic (congestive) heart failure: Secondary | ICD-10-CM | POA: Diagnosis not present

## 2021-07-07 DIAGNOSIS — I428 Other cardiomyopathies: Secondary | ICD-10-CM | POA: Diagnosis not present

## 2021-07-07 LAB — COMPREHENSIVE METABOLIC PANEL
ALT: 13 U/L (ref 0–44)
AST: 21 U/L (ref 15–41)
Albumin: 3.2 g/dL — ABNORMAL LOW (ref 3.5–5.0)
Alkaline Phosphatase: 76 U/L (ref 38–126)
Anion gap: 9 (ref 5–15)
BUN: 38 mg/dL — ABNORMAL HIGH (ref 8–23)
CO2: 31 mmol/L (ref 22–32)
Calcium: 10.5 mg/dL — ABNORMAL HIGH (ref 8.9–10.3)
Chloride: 98 mmol/L (ref 98–111)
Creatinine, Ser: 0.92 mg/dL (ref 0.44–1.00)
GFR, Estimated: 60 mL/min — ABNORMAL LOW (ref >60)
Glucose, Bld: 114 mg/dL — ABNORMAL HIGH (ref 70–99)
Potassium: 3.9 mmol/L (ref 3.5–5.1)
Sodium: 138 mmol/L (ref 135–145)
Total Bilirubin: 0.7 mg/dL (ref 0.3–1.2)
Total Protein: 6.8 g/dL (ref 6.5–8.1)

## 2021-07-07 LAB — CBC WITH DIFFERENTIAL/PLATELET
Abs Immature Granulocytes: 0.06 10*3/uL (ref 0.00–0.07)
Basophils Absolute: 0 10*3/uL (ref 0.0–0.1)
Basophils Relative: 0 %
Eosinophils Absolute: 0 10*3/uL (ref 0.0–0.5)
Eosinophils Relative: 0 %
HCT: 39.4 % (ref 36.0–46.0)
Hemoglobin: 12.3 g/dL (ref 12.0–15.0)
Immature Granulocytes: 1 %
Lymphocytes Relative: 22 %
Lymphs Abs: 2.5 10*3/uL (ref 0.7–4.0)
MCH: 25.1 pg — ABNORMAL LOW (ref 26.0–34.0)
MCHC: 31.2 g/dL (ref 30.0–36.0)
MCV: 80.2 fL (ref 80.0–100.0)
Monocytes Absolute: 0.8 10*3/uL (ref 0.1–1.0)
Monocytes Relative: 7 %
Neutro Abs: 8 10*3/uL — ABNORMAL HIGH (ref 1.7–7.7)
Neutrophils Relative %: 70 %
Platelets: 269 10*3/uL (ref 150–400)
RBC: 4.91 MIL/uL (ref 3.87–5.11)
RDW: 17.5 % — ABNORMAL HIGH (ref 11.5–15.5)
WBC: 11.5 10*3/uL — ABNORMAL HIGH (ref 4.0–10.5)
nRBC: 0 % (ref 0.0–0.2)

## 2021-07-07 LAB — URINALYSIS, COMPLETE (UACMP) WITH MICROSCOPIC
Bilirubin Urine: NEGATIVE
Glucose, UA: NEGATIVE mg/dL
Hgb urine dipstick: NEGATIVE
Ketones, ur: NEGATIVE mg/dL
Leukocytes,Ua: NEGATIVE
Nitrite: NEGATIVE
Protein, ur: NEGATIVE mg/dL
Specific Gravity, Urine: 1.009 (ref 1.005–1.030)
pH: 8 (ref 5.0–8.0)

## 2021-07-07 LAB — C-REACTIVE PROTEIN: CRP: 0.5 mg/dL (ref <1.0)

## 2021-07-07 LAB — ECHOCARDIOGRAM COMPLETE
Area-P 1/2: 5.42 cm2
Calc EF: 54.7 %
Height: 64 in
S' Lateral: 2.8 cm
Single Plane A2C EF: 47.8 %
Single Plane A4C EF: 62.1 %
Weight: 4130.54 oz

## 2021-07-07 LAB — FERRITIN: Ferritin: 34 ng/mL (ref 11–307)

## 2021-07-07 LAB — MAGNESIUM: Magnesium: 1.9 mg/dL (ref 1.7–2.4)

## 2021-07-07 MED ORDER — PERFLUTREN LIPID MICROSPHERE
1.0000 mL | INTRAVENOUS | Status: AC | PRN
  Administered 2021-07-07: 2 mL via INTRAVENOUS
  Filled 2021-07-07: qty 10

## 2021-07-07 MED ORDER — IOHEXOL 350 MG/ML SOLN
75.0000 mL | Freq: Once | INTRAVENOUS | Status: AC | PRN
  Administered 2021-07-07: 75 mL via INTRAVENOUS

## 2021-07-07 NOTE — Plan of Care (Signed)
°  Problem: Education: °Goal: Ability to demonstrate management of disease process will improve °Outcome: Progressing °Goal: Ability to verbalize understanding of medication therapies will improve °Outcome: Progressing °Goal: Individualized Educational Video(s) °Outcome: Progressing °  °Problem: Activity: °Goal: Capacity to carry out activities will improve °Outcome: Progressing °  °Problem: Cardiac: °Goal: Ability to achieve and maintain adequate cardiopulmonary perfusion will improve °Outcome: Progressing °  °Problem: Education: °Goal: Knowledge of General Education information will improve °Description: Including pain rating scale, medication(s)/side effects and non-pharmacologic comfort measures °Outcome: Progressing °  °Problem: Health Behavior/Discharge Planning: °Goal: Ability to manage health-related needs will improve °Outcome: Progressing °  °Problem: Clinical Measurements: °Goal: Ability to maintain clinical measurements within normal limits will improve °Outcome: Progressing °Goal: Will remain free from infection °Outcome: Progressing °Goal: Diagnostic test results will improve °Outcome: Progressing °Goal: Respiratory complications will improve °Outcome: Progressing °Goal: Cardiovascular complication will be avoided °Outcome: Progressing °  °Problem: Activity: °Goal: Risk for activity intolerance will decrease °Outcome: Progressing °  °Problem: Nutrition: °Goal: Adequate nutrition will be maintained °Outcome: Progressing °  °Problem: Coping: °Goal: Level of anxiety will decrease °Outcome: Progressing °  °Problem: Elimination: °Goal: Will not experience complications related to bowel motility °Outcome: Progressing °Goal: Will not experience complications related to urinary retention °Outcome: Progressing °  °Problem: Pain Managment: °Goal: General experience of comfort will improve °Outcome: Progressing °  °Problem: Safety: °Goal: Ability to remain free from injury will improve °Outcome: Progressing °   °Problem: Skin Integrity: °Goal: Risk for impaired skin integrity will decrease °Outcome: Progressing °  °Problem: Education: °Goal: Knowledge of risk factors and measures for prevention of condition will improve °Outcome: Progressing °  °Problem: Coping: °Goal: Psychosocial and spiritual needs will be supported °Outcome: Progressing °  °Problem: Respiratory: °Goal: Will maintain a patent airway °Outcome: Progressing °Goal: Complications related to the disease process, condition or treatment will be avoided or minimized °Outcome: Progressing °  °

## 2021-07-07 NOTE — Progress Notes (Signed)
?  Echocardiogram ?2D Echocardiogram has been performed. ? [name] ?07/07/2021, 8:57 AM ?

## 2021-07-07 NOTE — Progress Notes (Signed)
?PROGRESS NOTE ? ? ? ?[name]  HEN:[id number] DOB: 09/26/1930 DOA: 07/04/2021 ?PCP: [facility]  ? ?Brief Narrative:  ?88 year old with history of diastolic CHF, asthma, COPD, GERD, HTN, hypothyroidism admitted for dyspnea, productive cough, nausea and diarrhea which started 3-4 days prior to admission.  Upon admission patient was diagnosed with COVID-19 and CHF exacerbation.  Chest x-ray showed mild pulmonary vascular congestion.  She was started on steroids, bronchodilators, IV Lasix.  CTA chest was negative for pulmonary embolism. ? ? ?Assessment & Plan: ? Principal Problem: ?  Acute on chronic diastolic CHF (congestive heart failure) ([place]) ?Active Problems: ?  COVID-19 virus infection ?  Acute hypoxemic respiratory failure due to COVID-19 [facility]) ?  Hypothyroidism ?  Atrial fibrillation ([place]) ?  Essential hypertension ?  Asthma-COPD overlap syndrome ([place]) ?  ? ? ?Assessment and Plan: ?* Acute on chronic diastolic CHF (congestive heart failure) ([place]), class III ?- Still quite a lot of abnormal breath sounds.  Troponins remain flat.  Procalcitonin is negative.  Although on room air she is still significantly volume overloaded ?- She had a 2D echo with EF of 60 to 65% last year. ?- Repeat echocardiogram - pending.  ?-Continue Lasix 40 mg IV twice daily.  Closely monitor. ? ?COVID-19 virus infection ?- The patient will be placed on molnupiravir.  Prednisone, supportive care.  As needed bronchodilators ?- She will be placed on mucolytics, zinc sulfate, vitamin C and vitamin D3. ?-Elevated D-dimer.  CTA chest negative for PE but shows concerns for atypical infection. ? ?Acute hypoxemic respiratory failure due to COVID-19 [facility]) ?- Combination of CHF and COVID-19.  Supplemental oxygen ? ?Atrial fibrillation ([place]) ?- Toprol-XL, Eliquis ? ?Hypothyroidism ?- Synthroid 150 mcg daily ? ?Asthma-COPD overlap syndrome ([place]) ?- Bronchodilators ? ?Essential hypertension ?- Toprol-XL, IV as needed  ordered ? ? ?PT/OT- pending.  ? ?DVT prophylaxis: Already on Eliquis ?Code Status: DNI ?Family Communication:  Daughter updated.  ? ?Status is: Inpatient ?Remains inpatient appropriate because: Patient still has significant amount of abnormal breath sounds.  Given her age and comorbidities.  Maintain hospital stay for bronchodilators and diuretics. ? ?Subjective: ?Still has some coughing and exertional dyspnea. ? ?Examination: ?Constitutional: Not in acute distress ?Respiratory: Diffuse bilateral rhonchi ?Cardiovascular: Normal sinus rhythm, no rubs ?Abdomen: Nontender nondistended good bowel sounds ?Musculoskeletal: 2-3+ bilateral lower extremity pitting edema ?Skin: No rashes seen ?Neurologic: CN 2-12 grossly intact.  And nonfocal ?Psychiatric: Normal judgment and insight. Alert and oriented x 3. Normal mood.  Elderly frail. ? ?Objective: ?Vitals:  ? 07/06/21 2214 07/06/21 2352 07/07/21 0402 07/07/21 0500  ?BP: 134/69 135/63 (!) 98/45   ?Pulse:  68 63   ?Resp:  20 20   ?Temp:  97.8 ?F (36.6 ?C) (!) 97.2 ?F (36.2 ?C)   ?TempSrc:  Oral Oral   ?SpO2:  97% 94%   ?Weight:    117.1 kg  ?Height:      ? ? ?Intake/Output Summary (Last 24 hours) at 07/07/2021 1036 ?Last data filed at 07/07/2021 0404 ?Gross per 24 hour  ?Intake --  ?Output 2930 ml  ?Net -2930 ml  ? ?Filed Weights  ? 07/05/21 1700 07/07/21 0500  ?Weight: 116 kg 117.1 kg  ? ? ? ?Data Reviewed:  ? ?CBC: ?Recent Labs  ?Lab 06/30/21 ?1708 07/04/21 ?1531 07/05/21 ?0408 07/06/21 ?0700 07/07/21 ?0440  ?WBC 6.9 5.0 3.1* 7.1 11.5*  ?NEUTROABS  --   --  2.0 5.4 8.0*  ?HGB 11.0* 11.4* 11.4* 11.7* 12.3  ?HCT 35.8* 37.0  36.6 37.6 39.4  ?MCV 80.8 81.1 81.2 79.2* 80.2  ?PLT 224 204 202 246 269  ? ?Basic Metabolic Panel: ?Recent Labs  ?Lab 06/30/21 ?1708 07/04/21 ?1531 07/05/21 ?0408 07/06/21 ?0700 07/07/21 ?0440  ?NA 132* 135 140 139 138  ?K 3.8 3.9 4.7 4.3 3.9  ?CL 100 106 106 104 98  ?CO2 '25 26 30 30 31  '$ ?GLUCOSE 109* 113* 182* 153* 114*  ?BUN 21 26* 28* 31* 38*   ?CREATININE 0.89 1.09* 1.02* 0.93 0.92  ?CALCIUM 9.8 9.9 10.2 10.4* 10.5*  ?MG  --   --   --  2.1 1.9  ? ?GFR: ?Estimated Creatinine Clearance: 53.2 mL/min (by C-G formula based on SCr of 0.92 mg/dL). ?Liver Function Tests: ?Recent Labs  ?Lab 06/30/21 ?1708 07/05/21 ?0408 07/06/21 ?0700 07/07/21 ?0440  ?AST '23 27 26 21  '$ ?ALT '12 15 16 13  '$ ?ALKPHOS 89 86 82 76  ?BILITOT 0.7 0.5 0.2* 0.7  ?PROT 7.1 6.9 7.0 6.8  ?ALBUMIN 3.4* 3.2* 3.2* 3.2*  ? ?No results for input(s): LIPASE, AMYLASE in the last 168 hours. ?No results for input(s): AMMONIA in the last 168 hours. ?Coagulation Profile: ?No results for input(s): INR, PROTIME in the last 168 hours. ?Cardiac Enzymes: ?No results for input(s): CKTOTAL, CKMB, CKMBINDEX, TROPONINI in the last 168 hours. ?BNP (last 3 results) ?No results for input(s): PROBNP in the last 8760 hours. ?HbA1C: ?No results for input(s): HGBA1C in the last 72 hours. ?CBG: ?Recent Labs  ?Lab 07/05/21 ?2157  ?GLUCAP 190*  ? ?Lipid Profile: ?No results for input(s): CHOL, HDL, LDLCALC, TRIG, CHOLHDL, LDLDIRECT in the last 72 hours. ?Thyroid Function Tests: ?No results for input(s): TSH, T4TOTAL, FREET4, T3FREE, THYROIDAB in the last 72 hours. ?Anemia Panel: ?Recent Labs  ?  07/06/21 ?0700 07/07/21 ?0440  ?FERRITIN 36 34  ? ?Sepsis Labs: ?Recent Labs  ?Lab 07/04/21 ?2241  ?PROCALCITON <0.10  ? ? ?Recent Results (from the past 240 hour(s))  ?Resp Panel by RT-PCR (Flu A&B, Covid) Nasopharyngeal Swab     Status: Abnormal  ? Collection Time: 07/04/21  3:47 PM  ? Specimen: Nasopharyngeal Swab; Nasopharyngeal(NP) swabs in vial transport medium  ?Result Value Ref Range Status  ? SARS Coronavirus 2 by RT PCR POSITIVE (A) NEGATIVE Final  ?  Comment: (NOTE) ?SARS-CoV-2 target nucleic acids are DETECTED. ? ?The SARS-CoV-2 RNA is generally detectable in upper respiratory ?specimens during the acute phase of infection. Positive results are ?indicative of the presence of the identified virus, but do not rule ?out  bacterial infection or co-infection with other pathogens not ?detected by the test. Clinical correlation with patient history and ?other diagnostic information is necessary to determine patient ?infection status. The expected result is Negative. ? ?Fact Sheet for Patients: ?EntrepreneurPulse.com.au ? ?Fact Sheet for Healthcare Providers: ?IncredibleEmployment.be ? ?This test is not yet approved or cleared by the [place] FDA and  ?has been authorized for detection and/or diagnosis of SARS-CoV-2 by ?FDA under an Emergency Use Authorization (EUA).  This EUA will ?remain in effect (meaning this test can be used) for the duration of  ?the COVID-19 declaration under Section 564(b)(1) of the A ct, 21 ?U.S.C. section 360bbb-3(b)(1), unless the authorization is ?terminated or revoked sooner. ? ?  ? Influenza A by PCR NEGATIVE NEGATIVE Final  ? Influenza B by PCR NEGATIVE NEGATIVE Final  ?  Comment: (NOTE) ?The Xpert Xpress SARS-CoV-2/FLU/RSV plus assay is intended as an aid ?in the diagnosis of influenza from Nasopharyngeal swab specimens and ?should not be  used as a sole basis for treatment. Nasal washings and ?aspirates are unacceptable for Xpert Xpress SARS-CoV-2/FLU/RSV ?testing. ? ?Fact Sheet for Patients: ?EntrepreneurPulse.com.au ? ?Fact Sheet for Healthcare Providers: ?IncredibleEmployment.be ? ?This test is not yet approved or cleared by the [place] FDA and ?has been authorized for detection and/or diagnosis of SARS-CoV-2 by ?FDA under an Emergency Use Authorization (EUA). This EUA will remain ?in effect (meaning this test can be used) for the duration of the ?COVID-19 declaration under Section 564(b)(1) of the Act, 21 U.S.C. ?section 360bbb-3(b)(1), unless the authorization is terminated or ?revoked. ? ?Performed at [facility], [place], ?[place] [address] ?  ?  ? ? ? ? ? ?Radiology Studies: ?CT Angio Chest  Pulmonary Embolism (PE) W or WO Contrast ? ?Result Date: 07/07/2021 ?CLINICAL DATA:  Shortness of breath. High probability for acute pulmonary embolus. EXAM: CT ANGIOGRAPHY CHEST WITH CONTRAST TECHNIQUE: Multidetector CT imaging o

## 2021-07-08 DIAGNOSIS — I5033 Acute on chronic diastolic (congestive) heart failure: Secondary | ICD-10-CM | POA: Diagnosis not present

## 2021-07-08 LAB — COMPREHENSIVE METABOLIC PANEL
ALT: 16 U/L (ref 0–44)
AST: 22 U/L (ref 15–41)
Albumin: 3.1 g/dL — ABNORMAL LOW (ref 3.5–5.0)
Alkaline Phosphatase: 76 U/L (ref 38–126)
Anion gap: 8 (ref 5–15)
BUN: 40 mg/dL — ABNORMAL HIGH (ref 8–23)
CO2: 33 mmol/L — ABNORMAL HIGH (ref 22–32)
Calcium: 10.5 mg/dL — ABNORMAL HIGH (ref 8.9–10.3)
Chloride: 98 mmol/L (ref 98–111)
Creatinine, Ser: 1.04 mg/dL — ABNORMAL HIGH (ref 0.44–1.00)
GFR, Estimated: 52 mL/min — ABNORMAL LOW (ref >60)
Glucose, Bld: 109 mg/dL — ABNORMAL HIGH (ref 70–99)
Potassium: 4.2 mmol/L (ref 3.5–5.1)
Sodium: 139 mmol/L (ref 135–145)
Total Bilirubin: 0.6 mg/dL (ref 0.3–1.2)
Total Protein: 6.6 g/dL (ref 6.5–8.1)

## 2021-07-08 LAB — CBC WITH DIFFERENTIAL/PLATELET
Abs Immature Granulocytes: 0.03 10*3/uL (ref 0.00–0.07)
Basophils Absolute: 0 10*3/uL (ref 0.0–0.1)
Basophils Relative: 0 %
Eosinophils Absolute: 0 10*3/uL (ref 0.0–0.5)
Eosinophils Relative: 0 %
HCT: 40.9 % (ref 36.0–46.0)
Hemoglobin: 12.8 g/dL (ref 12.0–15.0)
Immature Granulocytes: 0 %
Lymphocytes Relative: 23 %
Lymphs Abs: 2.4 10*3/uL (ref 0.7–4.0)
MCH: 25 pg — ABNORMAL LOW (ref 26.0–34.0)
MCHC: 31.3 g/dL (ref 30.0–36.0)
MCV: 80 fL (ref 80.0–100.0)
Monocytes Absolute: 1.2 10*3/uL — ABNORMAL HIGH (ref 0.1–1.0)
Monocytes Relative: 12 %
Neutro Abs: 6.5 10*3/uL (ref 1.7–7.7)
Neutrophils Relative %: 65 %
Platelets: 244 10*3/uL (ref 150–400)
RBC: 5.11 MIL/uL (ref 3.87–5.11)
RDW: 17.4 % — ABNORMAL HIGH (ref 11.5–15.5)
WBC: 10.1 10*3/uL (ref 4.0–10.5)
nRBC: 0 % (ref 0.0–0.2)

## 2021-07-08 LAB — FERRITIN: Ferritin: 35 ng/mL (ref 11–307)

## 2021-07-08 LAB — MAGNESIUM: Magnesium: 2.1 mg/dL (ref 1.7–2.4)

## 2021-07-08 LAB — C-REACTIVE PROTEIN: CRP: 0.6 mg/dL (ref <1.0)

## 2021-07-08 NOTE — Progress Notes (Signed)
?PROGRESS NOTE ? ? ? ?[name]  LNL:[id number] DOB: 1931/01/20 DOA: 07/04/2021 ?PCP: [facility]  ? ?Brief Narrative:  ?88 year old with history of diastolic CHF, asthma, COPD, GERD, HTN, hypothyroidism admitted for dyspnea, productive cough, nausea and diarrhea which started 3-4 days prior to admission.  Upon admission patient was diagnosed with COVID-19 and CHF exacerbation.  Chest x-ray showed mild pulmonary vascular congestion.  She was started on steroids, bronchodilators, IV Lasix.  CTA chest was negative for pulmonary embolism. She was made euvolemic, IV lasix was stopped.  ? ? ?Assessment & Plan: ? Principal Problem: ?  Acute on chronic diastolic CHF (congestive heart failure) ([place]) ?Active Problems: ?  COVID-19 virus infection ?  Acute hypoxemic respiratory failure due to COVID-19 [facility]) ?  Hypothyroidism ?  Atrial fibrillation ([place]) ?  Essential hypertension ?  Asthma-COPD overlap syndrome ([place]) ?  ? ? ?Assessment and Plan: ?* Acute on chronic diastolic CHF (congestive heart failure) ([place]), class III ?- ProCal Neg. overall appears euvolemic therefore we will stop her IV Lasix today ?- She had a 2D echo with EF of 60 to 65% last year. ?- Repeat echocardiogram - 55%, no Wall motion abnormality.  ? ?COVID-19 virus infection ?- The patient will be placed on molnupiravir.  Prednisone, supportive care.  Patient still has quite a bit of abnormal breath sounds requiring aggressive bronchodilator treatment ?- She will be placed on mucolytics, zinc sulfate, vitamin C and vitamin D3. ?-Elevated D-dimer.  CTA chest negative for PE but shows concerns for atypical infection. ? ?Acute hypoxemic respiratory failure due to COVID-19 [facility]) ?- Combination of CHF and COVID-19.  Supplemental oxygen ? ?Atrial fibrillation ([place]) ?- Toprol-XL, Eliquis ? ?Hypothyroidism ?- Synthroid 150 mcg daily ? ?Asthma-COPD overlap syndrome ([place]) ?- Bronchodilators ? ?Essential hypertension ?- Toprol-XL, IV as needed  ordered ? ? ?PT/OT- pending.  ? ?DVT prophylaxis: Already on Eliquis ?Code Status: DNI ?Family Communication:  Daughter updated.  ? ?Status is: Inpatient ?Remains inpatient appropriate because: Cont bronchodilator treatment for atleast next 24 hrs, in the meantime TOC will contact her ALF to determine isolation period.  ? ?Subjective: ?Still has quite a bit of congestion and coughing causing some ambulatory shortness of breath. ? ?Examination: ?Constitutional: Not in acute distress. Elderly frail ?Respiratory: diffuse b/l Rhonchi ?Cardiovascular: Normal sinus rhythm, no rubs ?Abdomen: Nontender nondistended good bowel sounds ?Musculoskeletal: 2+ LE pitting edema.  ?Skin: No rashes seen ?Neurologic: CN 2-12 grossly intact.  And nonfocal ?Psychiatric: Normal judgment and insight. Alert and oriented x 3. Normal mood.  ? ? ?Objective: ?Vitals:  ? 07/08/21 0434 07/08/21 0442 07/08/21 0817 07/08/21 1217  ?BP: (!) 145/51  (!) 153/48 122/80  ?Pulse: (!) 58  60 (!) 56  ?Resp: '18  20 16  '$ ?Temp: 97.9 ?F (36.6 ?C)  98 ?F (36.7 ?C) 97.6 ?F (36.4 ?C)  ?TempSrc: Oral  Oral Oral  ?SpO2: 95%  92% 98%  ?Weight:  118.8 kg    ?Height:      ? ? ?Intake/Output Summary (Last 24 hours) at 07/08/2021 1224 ?Last data filed at 07/08/2021 1220 ?Gross per 24 hour  ?Intake 360 ml  ?Output 1750 ml  ?Net -1390 ml  ? ?Filed Weights  ? 07/05/21 1700 07/07/21 0500 07/08/21 0442  ?Weight: 116 kg 117.1 kg 118.8 kg  ? ? ? ?Data Reviewed:  ? ?CBC: ?Recent Labs  ?Lab 07/04/21 ?1531 07/05/21 ?0408 07/06/21 ?0700 07/07/21 ?4081 07/08/21 ?0458  ?WBC 5.0 3.1* 7.1 11.5* 10.1  ?NEUTROABS  --  2.0  5.4 8.0* 6.5  ?HGB 11.4* 11.4* 11.7* 12.3 12.8  ?HCT 37.0 36.6 37.6 39.4 40.9  ?MCV 81.1 81.2 79.2* 80.2 80.0  ?PLT 204 202 246 269 244  ? ?Basic Metabolic Panel: ?Recent Labs  ?Lab 07/04/21 ?1531 07/05/21 ?0408 07/06/21 ?0700 07/07/21 ?7001 07/08/21 ?0458  ?NA 135 140 139 138 139  ?K 3.9 4.7 4.3 3.9 4.2  ?CL 106 106 104 98 98  ?CO2 '26 30 30 31 '$ 33*  ?GLUCOSE 113* 182*  153* 114* 109*  ?BUN 26* 28* 31* 38* 40*  ?CREATININE 1.09* 1.02* 0.93 0.92 1.04*  ?CALCIUM 9.9 10.2 10.4* 10.5* 10.5*  ?MG  --   --  2.1 1.9 2.1  ? ?GFR: ?Estimated Creatinine Clearance: 47.4 mL/min (A) (by C-G formula based on SCr of 1.04 mg/dL (H)). ?Liver Function Tests: ?Recent Labs  ?Lab 07/05/21 ?0408 07/06/21 ?0700 07/07/21 ?7494 07/08/21 ?0458  ?AST '27 26 21 22  '$ ?ALT '15 16 13 16  '$ ?ALKPHOS 86 82 76 76  ?BILITOT 0.5 0.2* 0.7 0.6  ?PROT 6.9 7.0 6.8 6.6  ?ALBUMIN 3.2* 3.2* 3.2* 3.1*  ? ?No results for input(s): LIPASE, AMYLASE in the last 168 hours. ?No results for input(s): AMMONIA in the last 168 hours. ?Coagulation Profile: ?No results for input(s): INR, PROTIME in the last 168 hours. ?Cardiac Enzymes: ?No results for input(s): CKTOTAL, CKMB, CKMBINDEX, TROPONINI in the last 168 hours. ?BNP (last 3 results) ?No results for input(s): PROBNP in the last 8760 hours. ?HbA1C: ?No results for input(s): HGBA1C in the last 72 hours. ?CBG: ?Recent Labs  ?Lab 07/05/21 ?2157  ?GLUCAP 190*  ? ?Lipid Profile: ?No results for input(s): CHOL, HDL, LDLCALC, TRIG, CHOLHDL, LDLDIRECT in the last 72 hours. ?Thyroid Function Tests: ?No results for input(s): TSH, T4TOTAL, FREET4, T3FREE, THYROIDAB in the last 72 hours. ?Anemia Panel: ?Recent Labs  ?  07/07/21 ?4967 07/08/21 ?0458  ?FERRITIN 34 35  ? ?Sepsis Labs: ?Recent Labs  ?Lab 07/04/21 ?2241  ?PROCALCITON <0.10  ? ? ?Recent Results (from the past 240 hour(s))  ?Resp Panel by RT-PCR (Flu A&B, Covid) Nasopharyngeal Swab     Status: Abnormal  ? Collection Time: 07/04/21  3:47 PM  ? Specimen: Nasopharyngeal Swab; Nasopharyngeal(NP) swabs in vial transport medium  ?Result Value Ref Range Status  ? SARS Coronavirus 2 by RT PCR POSITIVE (A) NEGATIVE Final  ?  Comment: (NOTE) ?SARS-CoV-2 target nucleic acids are DETECTED. ? ?The SARS-CoV-2 RNA is generally detectable in upper respiratory ?specimens during the acute phase of infection. Positive results are ?indicative of the  presence of the identified virus, but do not rule ?out bacterial infection or co-infection with other pathogens not ?detected by the test. Clinical correlation with patient history and ?other diagnostic information is necessary to determine patient ?infection status. The expected result is Negative. ? ?Fact Sheet for Patients: ?EntrepreneurPulse.com.au ? ?Fact Sheet for Healthcare Providers: ?IncredibleEmployment.be ? ?This test is not yet approved or cleared by the [place] FDA and  ?has been authorized for detection and/or diagnosis of SARS-CoV-2 by ?FDA under an Emergency Use Authorization (EUA).  This EUA will ?remain in effect (meaning this test can be used) for the duration of  ?the COVID-19 declaration under Section 564(b)(1) of the A ct, 21 ?U.S.C. section 360bbb-3(b)(1), unless the authorization is ?terminated or revoked sooner. ? ?  ? Influenza A by PCR NEGATIVE NEGATIVE Final  ? Influenza B by PCR NEGATIVE NEGATIVE Final  ?  Comment: (NOTE) ?The Xpert Xpress SARS-CoV-2/FLU/RSV plus assay is intended as an  aid ?in the diagnosis of influenza from Nasopharyngeal swab specimens and ?should not be used as a sole basis for treatment. Nasal washings and ?aspirates are unacceptable for Xpert Xpress SARS-CoV-2/FLU/RSV ?testing. ? ?Fact Sheet for Patients: ?EntrepreneurPulse.com.au ? ?Fact Sheet for Healthcare Providers: ?IncredibleEmployment.be ? ?This test is not yet approved or cleared by the [place] FDA and ?has been authorized for detection and/or diagnosis of SARS-CoV-2 by ?FDA under an Emergency Use Authorization (EUA). This EUA will remain ?in effect (meaning this test can be used) for the duration of the ?COVID-19 declaration under Section 564(b)(1) of the Act, 21 U.S.C. ?section 360bbb-3(b)(1), unless the authorization is terminated or ?revoked. ? ?Performed at [facility], [place], ?[place]  [address] ?  ?  ? ? ? ? ? ?Radiology Studies: ?CT Angio Chest Pulmonary Embolism (PE) W or WO Contrast ? ?Result Date: 07/07/2021 ?CLINICAL DATA:  Shortness of breath. High probability for acute pulmonary embolus. EXAM: CT ANG

## 2021-07-08 NOTE — Plan of Care (Signed)
  Problem: Elimination: Goal: Will not experience complications related to bowel motility Outcome: Progressing Goal: Will not experience complications related to urinary retention Outcome: Progressing   

## 2021-07-09 ENCOUNTER — Encounter (INDEPENDENT_AMBULATORY_CARE_PROVIDER_SITE_OTHER): Payer: Self-pay | Admitting: Nurse Practitioner

## 2021-07-09 DIAGNOSIS — I5033 Acute on chronic diastolic (congestive) heart failure: Secondary | ICD-10-CM | POA: Diagnosis not present

## 2021-07-09 LAB — CBC WITH DIFFERENTIAL/PLATELET
Abs Immature Granulocytes: 0.04 10*3/uL (ref 0.00–0.07)
Basophils Absolute: 0 10*3/uL (ref 0.0–0.1)
Basophils Relative: 0 %
Eosinophils Absolute: 0 10*3/uL (ref 0.0–0.5)
Eosinophils Relative: 0 %
HCT: 41.6 % (ref 36.0–46.0)
Hemoglobin: 12.8 g/dL (ref 12.0–15.0)
Immature Granulocytes: 1 %
Lymphocytes Relative: 27 %
Lymphs Abs: 2.2 10*3/uL (ref 0.7–4.0)
MCH: 24.5 pg — ABNORMAL LOW (ref 26.0–34.0)
MCHC: 30.8 g/dL (ref 30.0–36.0)
MCV: 79.5 fL — ABNORMAL LOW (ref 80.0–100.0)
Monocytes Absolute: 1.1 10*3/uL — ABNORMAL HIGH (ref 0.1–1.0)
Monocytes Relative: 14 %
Neutro Abs: 4.7 10*3/uL (ref 1.7–7.7)
Neutrophils Relative %: 58 %
Platelets: 248 10*3/uL (ref 150–400)
RBC: 5.23 MIL/uL — ABNORMAL HIGH (ref 3.87–5.11)
RDW: 17.7 % — ABNORMAL HIGH (ref 11.5–15.5)
WBC: 8.2 10*3/uL (ref 4.0–10.5)
nRBC: 0 % (ref 0.0–0.2)

## 2021-07-09 LAB — COMPREHENSIVE METABOLIC PANEL
ALT: 15 U/L (ref 0–44)
AST: 20 U/L (ref 15–41)
Albumin: 3.1 g/dL — ABNORMAL LOW (ref 3.5–5.0)
Alkaline Phosphatase: 74 U/L (ref 38–126)
Anion gap: 7 (ref 5–15)
BUN: 36 mg/dL — ABNORMAL HIGH (ref 8–23)
CO2: 34 mmol/L — ABNORMAL HIGH (ref 22–32)
Calcium: 10.5 mg/dL — ABNORMAL HIGH (ref 8.9–10.3)
Chloride: 97 mmol/L — ABNORMAL LOW (ref 98–111)
Creatinine, Ser: 0.94 mg/dL (ref 0.44–1.00)
GFR, Estimated: 58 mL/min — ABNORMAL LOW (ref >60)
Glucose, Bld: 102 mg/dL — ABNORMAL HIGH (ref 70–99)
Potassium: 4.1 mmol/L (ref 3.5–5.1)
Sodium: 138 mmol/L (ref 135–145)
Total Bilirubin: 0.6 mg/dL (ref 0.3–1.2)
Total Protein: 6.5 g/dL (ref 6.5–8.1)

## 2021-07-09 LAB — MAGNESIUM: Magnesium: 2.2 mg/dL (ref 1.7–2.4)

## 2021-07-09 LAB — C-REACTIVE PROTEIN: CRP: 0.9 mg/dL (ref <1.0)

## 2021-07-09 LAB — FERRITIN: Ferritin: 36 ng/mL (ref 11–307)

## 2021-07-09 NOTE — Care Management Important Message (Signed)
Important Message ? ?Patient Details  ?Name: [name] ?MRN: [id number] ?Date of Birth: 1931/02/19 ? ? ?Medicare Important Message Given:  Yes ? ?Reviewed Medicare IM with Marthe Patch, daughter, at [phone].  Copy sent securely to email address provided: [email or web address].   ? ? ?Dannette Barbara ?07/09/2021, 12:37 PM ?

## 2021-07-09 NOTE — TOC Progression Note (Signed)
Transition of Care (TOC) - Progression Note  ? ? ?Patient Details  ?Name: [name] ?MRN: [id number] ?Date of Birth: June 14, 1930 ? ?Transition of Care (TOC) CM/SW Contact  ?Laurena Slimmer, RN ?Phone Number: ?07/09/2021, 12:33 PM ? ?Clinical Narrative:    ?Spoke with the admissions coordinator, Loraine Maple.  She will come to assess the patient to determine if she could come back. Patient would be able to discharge to facility tomorrow pending clearance.  ? ? ?  ?  ? ?Expected Discharge Plan and Services ?  ?  ?  ?  ?  ?Expected Discharge Date: 07/09/21               ?  ?  ?  ?  ?  ?  ?  ?  ?  ?  ? ? ?Social Determinants of Health (SDOH) Interventions ?  ? ?Readmission Risk Interventions ? ?  04/27/2021  ?  1:16 PM  ?Readmission Risk Prevention Plan  ?Post Dischage Appt Complete  ?Medication Screening Complete  ?Transportation Screening Complete  ? ? ?

## 2021-07-09 NOTE — TOC Progression Note (Signed)
Transition of Care (TOC) - Progression Note  ? ? ?Patient Details  ?Name: [name] ?MRN: [id number] ?Date of Birth: 21-Dec-1930 ? ?Transition of Care (TOC) CM/SW Contact  ?Laurena Slimmer, RN ?Phone Number: ?07/09/2021, 10:19 AM ? ?Clinical Narrative:    ?Message left for [facility] admissions regarding patient COVID status and discharging back to facility. Request return call.  ? ? ?  ?  ? ?Expected Discharge Plan and Services ?  ?  ?  ?  ?  ?                ?  ?  ?  ?  ?  ?  ?  ?  ?  ?  ? ? ?Social Determinants of Health (SDOH) Interventions ?  ? ?Readmission Risk Interventions ? ?  04/27/2021  ?  1:16 PM  ?Readmission Risk Prevention Plan  ?Post Dischage Appt Complete  ?Medication Screening Complete  ?Transportation Screening Complete  ? ? ?

## 2021-07-09 NOTE — Discharge Summary (Signed)
Physician Discharge Summary  ?[name] XBD:[id number] DOB: 20-Sep-1930 DOA: 07/04/2021 ? ?PCP: [facility] ? ?Admit date: 07/04/2021 ?Discharge date: 07/09/2021 ? ?Admitted From: ALF ?Disposition:  ALF ? ?Recommendations for Outpatient Follow-up:  ?Follow up with PCP in 1-2 weeks ?Please obtain BMP/CBC in one week your next doctors visit.  ?Continue using Bronchodilator ?COVID isolation per facility guideline.  ?Lasix to be continued and adjusted.  ? ?Discharge Condition: Stable ?CODE STATUS: partial code  ?Diet recommendation:  ? ?Brief/Interim Summary: ?88 year old with history of diastolic CHF, asthma, COPD, GERD, HTN, hypothyroidism admitted for dyspnea, productive cough, nausea and diarrhea which started 3-4 days prior to admission.  Upon admission patient was diagnosed with COVID-19 and CHF exacerbation.  Chest x-ray showed mild pulmonary vascular congestion.  She was started on steroids, bronchodilators, IV Lasix.  CTA chest was negative for pulmonary embolism. She was made euvolemic, IV lasix was stopped and transitioned to PO.  ?Will stop Mopulnivir and Prednisone upon dc, continue bronchodilators.  ? ? ?Daughter updated daily, and today as well.  ?  ?  ?Assessment & Plan: ? Principal Problem: ?  Acute on chronic diastolic CHF (congestive heart failure) ([place]) ?Active Problems: ?  COVID-19 virus infection ?  Acute hypoxemic respiratory failure due to COVID-19 [facility]) ?  Hypothyroidism ?  Atrial fibrillation ([place]) ?  Essential hypertension ?  Asthma-COPD overlap syndrome ([place]) ?  ?  ?  ?Assessment and Plan: ?* Acute on chronic diastolic CHF (congestive heart failure) ([place]), class III ?- Breathing as improved quite a bit.  ProCal Neg. overall appears euvolemic therefore we will stop her IV Lasix today > transition to PO lasix upon dc.  ?- She had a 2D echo with EF of 60 to 65% last year. ?- Repeat echocardiogram - 55%, no Wall motion abnormality.  ?  ?COVID-19 virus infection ?- The patient will  be placed on molnupiravir.  Prednisone will be stopped. Continue bronchodilator.  ?-Elevated D-dimer.  CTA chest negative for PE but shows concerns for atypical infection. ?  ?Acute hypoxemic respiratory failure due to COVID-19 [facility]) ?- Combination of CHF and COVID-19.   ?  ?Atrial fibrillation ([place]) ?- Toprol-XL, Eliquis ?  ?Hypothyroidism ?- Synthroid 150 mcg daily ?  ?Asthma-COPD overlap syndrome ([place]) ?- Bronchodilators ?  ?Essential hypertension ?- Toprol-XL ?  ?  ? ?Discharge Diagnoses:  ?Principal Problem: ?  Acute on chronic diastolic CHF (congestive heart failure) ([place]) ?Active Problems: ?  COVID-19 virus infection ?  Acute hypoxemic respiratory failure due to COVID-19 [facility]) ?  Hypothyroidism ?  Atrial fibrillation ([place]) ?  Essential hypertension ?  Asthma-COPD overlap syndrome ([place]) ? ? ? ? ? ?Consultations: ?None ? ?Subjective: ?Feels great, denies any shortness of breath today. ? ?Discharge Exam: ?Vitals:  ? 07/09/21 0738 07/09/21 1125  ?BP: (!) 144/77 (!) 145/73  ?Pulse: (!) 55 72  ?Resp: (!) 21 20  ?Temp: 97.8 ?F (36.6 ?C) (!) 97.5 ?F (36.4 ?C)  ?SpO2: 96% 98%  ? ?Vitals:  ? 07/08/21 2326 07/09/21 0500 07/09/21 0738 07/09/21 1125  ?BP: (!) 146/87 (!) 135/102 (!) 144/77 (!) 145/73  ?Pulse: 60 66 (!) 55 72  ?Resp: 18 18 (!) 21 20  ?Temp: 97.9 ?F (36.6 ?C) 97.9 ?F (36.6 ?C) 97.8 ?F (36.6 ?C) (!) 97.5 ?F (36.4 ?C)  ?TempSrc:      ?SpO2: 94% 95% 96% 98%  ?Weight:  116.9 kg    ?Height:      ? ? ?General: Pt is alert, awake, not in  acute distress ?Cardiovascular: RRR, S1/S2 +, no rubs, no gallops ?Respiratory: Minimal bibasilar rhonchi ?Abdominal: Soft, NT, ND, bowel sounds + ?Extremities: no edema, no cyanosis ? ?Discharge Instructions ? ? ?Allergies as of 07/09/2021   ? ?   Reactions  ? Prednisone Other (See Comments)  ? Irritability  ? Aspirin Nausea Only  ? Sulfa Antibiotics Rash  ? Sulfacetamide Sodium Rash  ? ?  ? ?  ?Medication List  ?  ? ?STOP taking these medications   ? ?molnupiravir EUA 200 mg Caps  capsule ?Commonly known as: LAGEVRIO ?  ? ?  ? ?TAKE these medications   ? ?acetaminophen 500 MG tablet ?Commonly known as: TYLENOL ?Take 500-1,000 mg by mouth every 6 (six) hours as needed for mild pain or moderate pain. ?  ?apixaban 5 MG Tabs tablet ?Commonly known as: ELIQUIS ?Take 5 mg by mouth 2 (two) times daily. ?  ?benzonatate 100 MG capsule ?Commonly known as: TESSALON ?Take 1 capsule by mouth 3 (three) times daily as needed. ?  ?Cholecalciferol 1.25 MG (50000 UT) Tabs ?Take by mouth. ?  ?diclofenac Sodium 1 % Gel ?Commonly known as: VOLTAREN ?Apply 2 g topically 4 (four) times daily. ?  ?DULoxetine 60 MG capsule ?Commonly known as: CYMBALTA ?Take 60 mg by mouth daily. ?  ?famotidine 20 MG tablet ?Commonly known as: PEPCID ?Take 1 tablet by mouth at bedtime. ?  ?feeding supplement (PRO-STAT SUGAR FREE 64) Liqd ?Take 30 mLs by mouth daily. ?  ?furosemide 20 MG tablet ?Commonly known as: LASIX ?Take 20 mg by mouth 2 (two) times daily. ?  ?hydrOXYzine 10 MG tablet ?Commonly known as: ATARAX ?Take 10 mg by mouth 2 (two) times daily. ?  ?ipratropium-albuterol 0.5-2.5 (3) MG/3ML Soln ?Commonly known as: DUONEB ?Take 3 mLs by nebulization every 6 (six) hours as needed (shortness of breath). ?  ?levothyroxine 150 MCG tablet ?Commonly known as: SYNTHROID ?Take 150 mcg by mouth daily. ?  ?loratadine 10 MG tablet ?Commonly known as: CLARITIN ?Take 10 mg by mouth daily. ?  ?metoprolol succinate 25 MG 24 hr tablet ?Commonly known as: TOPROL-XL ?Take 0.5 tablets (12.5 mg total) by mouth at bedtime. ?  ?montelukast 10 MG tablet ?Commonly known as: SINGULAIR ?Take 10 mg by mouth daily. ?  ?PreserVision AREDS 2+Multi Vit Caps ?Take 1 capsule by mouth as directed. ?  ?therapeutic multivitamin-minerals tablet ?Take 1 tablet by mouth daily. ?  ?traZODone 50 MG tablet ?Commonly known as: DESYREL ?Take 1 tablet (50 mg total) by mouth at bedtime as needed for sleep. ?  ?triamcinolone cream 0.1 % ?Commonly known as: KENALOG ?Apply  1 application topically 2 (two) times daily as needed. ?  ?Ventolin HFA 108 (90 Base) MCG/ACT inhaler ?Generic drug: albuterol ?Inhale 2 puffs into the lungs every 6 (six) hours as needed for wheezing or shortness of breath. ?  ?vitamin B-12 500 MCG tablet ?Commonly known as: CYANOCOBALAMIN ?Take 500 mcg by mouth daily. ?  ?Vitamin D (Ergocalciferol) 1.25 MG (50000 UNIT) Caps capsule ?Commonly known as: DRISDOL ?Take 50,000 Units by mouth every 7 (seven) days. Saturday ?  ? ?  ? ? Follow-up Information   ? ? [facility] Follow up in 1 week(s).   ?Specialty: Geriatric Medicine ?Contact information: ?[address] ?[address] ?[place] [address] ?[phone] ? ? ?  ?  ? ?  ?  ? ?  ? ?Allergies  ?Allergen Reactions  ? Prednisone Other (See Comments)  ?  Irritability  ? Aspirin Nausea  Only  ? Sulfa Antibiotics Rash  ? Sulfacetamide Sodium Rash  ? ? ?You were cared for by a hospitalist during your hospital stay. If you have any questions about your discharge medications or the care you received while you were in the hospital after you are discharged, you can call the unit and asked to speak with the hospitalist on call if the hospitalist that took care of you is not available. Once you are discharged, your primary care physician will handle any further medical issues. Please note that no refills for any discharge medications will be authorized once you are discharged, as it is imperative that you return to your primary care physician (or establish a relationship with a primary care physician if you do not have one) for your aftercare needs so that they can reassess your need for medications and monitor your lab values. ? ? ?Procedures/Studies: ?DG Chest 2 View ? ?Result Date: 07/04/2021 ?CLINICAL DATA:  Short of breath EXAM: CHEST - 2 VIEW COMPARISON:  06/30/2021 FINDINGS: Prominent right heart border similar to the prior study. Mild vascular congestion. No edema. Mild left lower lobe atelectasis  similar. No effusion. IMPRESSION: Mild vascular congestion without edema Mild left lower lobe atelectasis. Electronically Signed   By: Franchot Gallo M.D.   On: 07/04/2021 16:31  ? ?CT HEAD WO CONTRAST (5

## 2021-07-09 NOTE — Progress Notes (Signed)
? ?Subjective:  ? ? Patient ID: [name], female    DOB: Nov 06, 1930, 88 y.o.   MRN: [id number] ?No chief complaint on file. ? ? ?[name] is a 88 year old female that returns today for follow-up evaluation of her lower extremity lymphedema.  Previously the patient has been placed into [place] wraps however she will require hospitalization with diuresis.  Following the most recent hospitalization her leg swelling has been rate controlled.  She has been utilizing elevation and compression garments at her nursing facility.  She has noted pain all over but she has significant arthritis in multiple areas. ? ? ?Review of Systems  ?Cardiovascular:  Positive for leg swelling.  ?Musculoskeletal:  Positive for arthralgias and back pain.  ?Neurological:  Positive for weakness.  ?All other systems reviewed and are negative. ? ?   ?Objective:  ? Physical Exam ?Vitals reviewed.  ?HENT:  ?   Head: Normocephalic.  ?Cardiovascular:  ?   Rate and Rhythm: Normal rate.  ?Pulmonary:  ?   Effort: Pulmonary effort is normal.  ?Skin: ?   General: Skin is warm and dry.  ?Neurological:  ?   Mental Status: She is alert and oriented to person, place, and time.  ?   Motor: Weakness present.  ?   Gait: Gait abnormal.  ?Psychiatric:     ?   Mood and Affect: Mood normal.     ?   Behavior: Behavior normal.     ?   Thought Content: Thought content normal.     ?   Judgment: Judgment normal.  ? ? ?BP 123/71 (BP Location: Right Arm)   Pulse (!) 59   Resp 18  ? ?Past Medical History:  ?Diagnosis Date  ? Arthritis   ? Asthma   ? CHF (congestive heart failure) ([place])   ? COPD (chronic obstructive pulmonary disease) ([place])   ? Cough   ? GERD (gastroesophageal reflux disease)   ? Gout   ? Hiatal hernia   ? Hypertension   ? Hypothyroidism   ? MRSA (methicillin resistant staph aureus) culture positive   ? Neuropathic pain of right lower extremity   ? Peripheral vascular disease ([place])   ? "poor circulation"  ? Seasonal allergies   ? Shortness of breath  dyspnea   ? Sleep apnea   ? uses CPAP (sometimes)  ? ? ?Social History  ? ?Socioeconomic History  ? Marital status: Widowed  ?  Spouse name: Not on file  ? Number of children: Not on file  ? Years of education: Not on file  ? Highest education level: Not on file  ?Occupational History  ? Not on file  ?Tobacco Use  ? Smoking status: Never  ?  Passive exposure: Yes  ? Smokeless tobacco: Never  ?Vaping Use  ? Vaping Use: Never used  ?Substance and Sexual Activity  ? Alcohol use: No  ? Drug use: No  ? Sexual activity: Not Currently  ?Other Topics Concern  ? Not on file  ?Social History Narrative  ? Not on file  ? ?Social Determinants of Health  ? ?Financial Resource Strain: Not on file  ?Food Insecurity: Not on file  ?Transportation Needs: No Transportation Needs  ? Lack of Transportation (Medical): No  ? Lack of Transportation (Non-Medical): No  ?Physical Activity: Not on file  ?Stress: Not on file  ?Social Connections: Not on file  ?Intimate Partner Violence: Not on file  ? ? ?Past Surgical History:  ?Procedure Laterality Date  ? ANKLE ARTHROSCOPY    ?  BACK SURGERY    ? L4-L5 Decompression  ? CARDIAC CATHETERIZATION  1995  ? CATARACT EXTRACTION W/PHACO Right 07/19/2014  ? Procedure: CATARACT EXTRACTION PHACO AND INTRAOCULAR LENS PLACEMENT (IOC);  Surgeon: Leandrew Koyanagi, MD;  Location: [facility];  Service: Ophthalmology;  Laterality: Right;  ? CATARACT EXTRACTION W/PHACO Left 08/23/2014  ? Procedure: CATARACT EXTRACTION PHACO AND INTRAOCULAR LENS PLACEMENT (IOC);  Surgeon: Leandrew Koyanagi, MD;  Location: [place];  Service: Ophthalmology;  Laterality: Left;  ? CHOLECYSTECTOMY    ? COLONOSCOPY    ? PARATHYROIDECTOMY  2003  ? TONSILLECTOMY    ? UVULOPALATOPHARYNGOPLASTY    ? ? ?Family History  ?Problem Relation Age of Onset  ? Cancer Mother   ? Hyperlipidemia Son   ? Diabetes Son   ? Cancer Maternal Grandmother   ? ? ?Allergies  ?Allergen Reactions  ? Prednisone Other (See Comments)  ?   Irritability  ? Aspirin Nausea Only  ? Sulfa Antibiotics Rash  ? Sulfacetamide Sodium Rash  ? ? ? ?  Latest Ref Rng & Units 07/08/2021  ?  4:58 AM 07/07/2021  ?  4:40 AM 07/06/2021  ?  7:00 AM  ?CBC  ?WBC 4.0 - 10.5 K/uL 10.1   11.5   7.1    ?Hemoglobin 12.0 - 15.0 g/dL 12.8   12.3   11.7    ?Hematocrit 36.0 - 46.0 % 40.9   39.4   37.6    ?Platelets 150 - 400 K/uL 244   269   246    ? ? ? ? ?CMP  ?   ?Component Value Date/Time  ? NA 139 07/08/2021 0458  ? NA 139 01/19/2013 0313  ? K 4.2 07/08/2021 0458  ? K 3.6 01/19/2013 0313  ? CL 98 07/08/2021 0458  ? CL 107 01/19/2013 0313  ? CO2 33 (H) 07/08/2021 0458  ? CO2 28 01/19/2013 0313  ? GLUCOSE 109 (H) 07/08/2021 0458  ? GLUCOSE 107 (H) 01/19/2013 0313  ? BUN 40 (H) 07/08/2021 0458  ? BUN 20 (H) 01/19/2013 0313  ? CREATININE 1.04 (H) 07/08/2021 0458  ? CREATININE 1.00 01/19/2013 0313  ? CALCIUM 10.5 (H) 07/08/2021 0458  ? CALCIUM 9.8 01/19/2013 0313  ? PROT 6.6 07/08/2021 0458  ? PROT 7.0 01/19/2013 0313  ? ALBUMIN 3.1 (L) 07/08/2021 0458  ? ALBUMIN 3.3 (L) 01/19/2013 0313  ? AST 22 07/08/2021 0458  ? AST 18 01/19/2013 0313  ? ALT 16 07/08/2021 0458  ? ALT 13 01/19/2013 0313  ? ALKPHOS 76 07/08/2021 0458  ? ALKPHOS 76 01/19/2013 0313  ? BILITOT 0.6 07/08/2021 0458  ? BILITOT 0.2 01/19/2013 0313  ? GFRNONAA 52 (L) 07/08/2021 0458  ? GFRNONAA 53 (L) 01/19/2013 0313  ? GFRAA 56 (L) 09/08/2019 1626  ? GFRAA >60 01/19/2013 0313  ? ? ? ?No results found. ? ?   ?Assessment & Plan:  ? ?1. Lymphedema of both lower extremities ?Patient is advised to continue with conservative therapy including use of elevation and compression.  The patient can utilize compression socks or compression wraps.  Her extensive arthritis makes it difficult for her exercise.  We will plan to have the patient return in 3 months or sooner if issues arise. ? ?2. Essential hypertension ?Continue antihypertensive medications as already ordered, these medications have been reviewed and there are no changes at  this time.  ? ?3. Chronic congestive heart failure, unspecified heart failure type ([place]) ?This likely plays a significant role in her lower extremity edema ? ? ?  No current facility-administered medications on file prior to visit.  ? ?Current Outpatient Medications on File Prior to Visit  ?Medication Sig Dispense Refill  ? acetaminophen (TYLENOL) 500 MG tablet Take 500-1,000 mg by mouth every 6 (six) hours as needed for mild pain or moderate pain.    ? Amino Acids-Protein Hydrolys (FEEDING SUPPLEMENT, PRO-STAT SUGAR FREE 64,) LIQD Take 30 mLs by mouth daily.    ? apixaban (ELIQUIS) 5 MG TABS tablet Take 5 mg by mouth 2 (two) times daily.    ? benzonatate (TESSALON) 100 MG capsule Take 1 capsule by mouth 3 (three) times daily as needed. (Patient not taking: Reported on 07/04/2021)    ? Cholecalciferol 1.25 MG (50000 UT) TABS Take by mouth. (Patient not taking: Reported on 07/04/2021)    ? DULoxetine (CYMBALTA) 60 MG capsule Take 60 mg by mouth daily.    ? famotidine (PEPCID) 20 MG tablet Take 1 tablet by mouth at bedtime.    ? furosemide (LASIX) 20 MG tablet Take 20 mg by mouth 2 (two) times daily.    ? hydrOXYzine (ATARAX) 10 MG tablet Take 10 mg by mouth 2 (two) times daily.    ? ipratropium-albuterol (DUONEB) 0.5-2.5 (3) MG/3ML SOLN Take 3 mLs by nebulization every 6 (six) hours as needed (shortness of breath). 360 mL 0  ? levothyroxine (SYNTHROID) 150 MCG tablet Take 150 mcg by mouth daily.    ? metoprolol succinate (TOPROL-XL) 25 MG 24 hr tablet Take 0.5 tablets (12.5 mg total) by mouth at bedtime. 15 tablet 0  ? montelukast (SINGULAIR) 10 MG tablet Take 10 mg by mouth daily.    ? Multiple Vitamins-Minerals (PRESERVISION AREDS 2+MULTI VIT) CAPS Take 1 capsule by mouth as directed.    ? therapeutic multivitamin-minerals (THERAGRAN-M) tablet Take 1 tablet by mouth daily.    ? triamcinolone cream (KENALOG) 0.1 % Apply 1 application topically 2 (two) times daily as needed.    ? VENTOLIN HFA 108 (90 Base) MCG/ACT  inhaler Inhale 2 puffs into the lungs every 6 (six) hours as needed for wheezing or shortness of breath.  12  ? vitamin B-12 (CYANOCOBALAMIN) 500 MCG tablet Take 500 mcg by mouth daily.    ? traZODone (DESYREL) 50 MG

## 2021-07-09 NOTE — Progress Notes (Signed)
ALF evaluated the ptn in the hospital, thinks she needs rehab.  ?PT here said [facility] 3 days ago. Will have them see the ptn again tomorrow since already after 5pm today. Repeat orders for PT OT placed.  ? ?Gerlean Ren MD ?

## 2021-07-09 NOTE — Plan of Care (Signed)

## 2021-07-10 DIAGNOSIS — I5033 Acute on chronic diastolic (congestive) heart failure: Secondary | ICD-10-CM | POA: Diagnosis not present

## 2021-07-10 LAB — BASIC METABOLIC PANEL
Anion gap: 8 (ref 5–15)
BUN: 32 mg/dL — ABNORMAL HIGH (ref 8–23)
CO2: 29 mmol/L (ref 22–32)
Calcium: 10.3 mg/dL (ref 8.9–10.3)
Chloride: 99 mmol/L (ref 98–111)
Creatinine, Ser: 0.94 mg/dL (ref 0.44–1.00)
GFR, Estimated: 58 mL/min — ABNORMAL LOW (ref >60)
Glucose, Bld: 94 mg/dL (ref 70–99)
Potassium: 3.9 mmol/L (ref 3.5–5.1)
Sodium: 136 mmol/L (ref 135–145)

## 2021-07-10 LAB — MAGNESIUM: Magnesium: 2.5 mg/dL — ABNORMAL HIGH (ref 1.7–2.4)

## 2021-07-10 NOTE — TOC Progression Note (Signed)
Transition of Care (TOC) - Progression Note  ? ? ?Patient Details  ?Name: [name] ?MRN: [id number] ?Date of Birth: 1930-04-22 ? ?Transition of Care (TOC) CM/SW Contact  ?Laurena Slimmer, RN ?Phone Number: ?07/10/2021, 9:58 AM ? ?Clinical Narrative:    ?Patient assessed by [facility] Admissions Coordinator ,Teneshia on 5/16. Patient unable to be accepted back. Per [facility] therapy assessment 5/17 recommendation remains unchanged. Called placed to [facility]. Left detailed message regarding therapy recommendation and patient request to return to facility. Requested return call to this CM.  ? ? ?  ?  ? ?Expected Discharge Plan and Services ?  ?  ?  ?  ?  ?Expected Discharge Date: 07/09/21               ?  ?  ?  ?  ?  ?  ?  ?  ?  ?  ? ? ?Social Determinants of Health (SDOH) Interventions ?  ? ?Readmission Risk Interventions ? ?  04/27/2021  ?  1:16 PM  ?Readmission Risk Prevention Plan  ?Post Dischage Appt Complete  ?Medication Screening Complete  ?Transportation Screening Complete  ? ? ?

## 2021-07-10 NOTE — Progress Notes (Signed)
Physical Therapy Treatment ?Patient Details ?Name: [name] ?MRN: [id number] ?DOB: November 06, 1930 ?Today's Date: 07/10/2021 ? ? ?History of Present Illness Pt is an 88 y/o F admitted on 07/04/21 for tx of dyspnea, productive cough, nausea & diarrhea, which started 3-4 days prior to admission. Pt was diagnosed with Covid-19 & COPD exacerbation. PMH: dCHF, asthma, COPD, GERD, HTN, hypothyroidism ? ?  ?PT Comments  ? ? Pt showed great effort and was motivated to do well to be able to go home (uninterested in rehab at all) to her facility.  Today she was able to get to sitting, rise to standing from multiple surfaces and do 2 bouts of ambulation (35 ft then 75 ft) with a FWW. She needed no physical assist (I did help her don socks, though she was very close to being able to do it on her own). Her O2 on room air stayed in the high 90s, HR stayed 50-80 with activity and though she does report feeling weaker than her baseline she is adamant that she does not need need rehab and she desperately wants to go back to ALF.   ?Recommendations for follow up therapy are one component of a multi-disciplinary discharge planning process, led by the attending physician.  Recommendations may be updated based on patient status, additional functional criteria and insurance authorization. ? ?Follow Up Recommendations ? Home health PT (at her ALF) ?  ?  ?Assistance Recommended at Discharge Frequent or constant Supervision/Assistance  ?Patient can return home with the following A little help with walking and/or transfers;A little help with bathing/dressing/bathroom ?  ?Equipment Recommendations ? None recommended by PT  ?  ?Recommendations for Other Services   ? ? ?  ?Precautions / Restrictions Precautions ?Precautions: Fall ?Restrictions ?Weight Bearing Restrictions: No  ?  ? ?Mobility ? Bed Mobility ?Overal bed mobility: Modified Independent ?Bed Mobility: Supine to Sit ?  ?  ?Supine to sit: Min guard ?  ?  ?General bed mobility comments:  Pt did well with getting to sitting EOB, able to transition with only minimal rail use, good effort and relatively confidence getting up to sitting ?  ? ?Transfers ?Overall transfer level: Needs assistance ?Equipment used: Rolling walker (2 wheels) ?Transfers: Sit to/from Stand ?Sit to Stand: Supervision ?  ?  ?  ?  ?  ?General transfer comment: Pt needed to use a little momentum and single UE to rise from bed, more confident getting up from recliner with b/l UE use.  No phyiscal assist needed in either case. ?  ? ?Ambulation/Gait ?Ambulation/Gait assistance: Min guard ?Gait Distance (Feet): 75 Feet ?Assistive device: Rolling walker (2 wheels) ?  ?  ?  ?  ?General Gait Details: 35 ft, then after rest break 75 ft.  Pt with slow and deliberate gait and some reliance on the wlaker but ultimately she was safe, did not need any direct assist and though she was subjectively quite fatigued with the effort her O2 remained in the high 90s on room air and her HR did not exceed 80 bpm.  Pt with no stagger stepping/LOBs/etc. ? ? ?Stairs ?  ?  ?  ?  ?  ? ? ?Wheelchair Mobility ?  ? ?Modified Rankin (Stroke Patients Only) ?  ? ? ?  ?Balance Overall balance assessment: Needs assistance ?Sitting-balance support: Feet supported, Bilateral upper extremity supported ?Sitting balance-Leahy Scale: Good ?Sitting balance - Comments: supervision static sitting ?  ?Standing balance support: Bilateral upper extremity supported, During functional activity ?Standing balance-Leahy Scale: Good ?  Standing balance comment: requires UEs, but with appropriate UE use on walker pt with no LOBs/unsteadiness ?  ?  ?  ?  ?  ?  ?  ?  ?  ?  ?  ?  ? ?  ?Cognition Arousal/Alertness: Awake/alert ?Behavior During Therapy: [facility] for tasks assessed/performed ?Overall Cognitive Status: Within Functional Limits for tasks assessed ?  ?  ?  ?  ?  ?  ?  ?  ?  ?  ?  ?  ?  ?  ?  ?  ?  ?  ?  ? ?  ?Exercises   ? ?  ?General Comments General comments (skin integrity, edema,  etc.): spo2 >90% on RA throughout ?  ?  ? ?Pertinent Vitals/Pain Pain Assessment ?Pain Assessment: No/denies pain  ? ? ?Home Living Family/patient expects to be discharged to:: Assisted living ?  ?  ?  ?  ?  ?  ?  ?  ?Home Equipment: Rollator (4 wheels) ?Additional Comments: per chart reivew, shower chair; pt is poor historian.  ?  ?Prior Function    ?  ?  ?   ? ?PT Goals (current goals can now be found in the care plan section) Progress towards PT goals: Progressing toward goals ? ?  ?Frequency ? ? ? Min 2X/week ? ? ? ?  ?PT Plan Current plan remains appropriate  ? ? ?Co-evaluation   ?  ?  ?  ?  ? ?  ?AM-PAC PT "6 Clicks" Mobility   ?Outcome Measure ? Help needed turning from your back to your side while in a flat bed without using bedrails?: None ?Help needed moving from lying on your back to sitting on the side of a flat bed without using bedrails?: None ?Help needed moving to and from a bed to a chair (including a wheelchair)?: None ?Help needed standing up from a chair using your arms (e.g., wheelchair or bedside chair)?: A Little ?Help needed to walk in hospital room?: None ?Help needed climbing 3-5 steps with a railing? : A Little ?6 Click Score: 22 ? ?  ?End of Session Equipment Utilized During Treatment: Gait belt ?Activity Tolerance: Patient tolerated treatment well ?Patient left: with chair alarm set;with call bell/phone within reach ?Nurse Communication: Mobility status ?PT Visit Diagnosis: Muscle weakness (generalized) (M62.81);Unsteadiness on feet (R26.81) ?  ? ? ?Time: 7408-1448 ?PT Time Calculation (min) (ACUTE ONLY): 44 min ? ?Charges:  $Gait Training: 8-22 mins ?$Therapeutic Exercise: 23-37 mins          ?          ? ?Kreg Shropshire, DPT ?07/10/2021, 11:24 AM ? ?

## 2021-07-10 NOTE — TOC Progression Note (Signed)
Transition of Care (TOC) - Progression Note  ? ? ?Patient Details  ?Name: [name] ?MRN: [id number] ?Date of Birth: [birth year] ? ?Transition of Care (TOC) CM/SW Contact  ?Laurena Slimmer, RN ?Phone Number: ?07/10/2021, 4:14 PM ? ?Clinical Narrative:    ?Spoke with Teneshia @ [facility] about therapy recommendations. She will come back 5/18 to reassess patient to determine if she would be accepted back to facility. ? ? ?  ?  ? ?Expected Discharge Plan and Services ?  ?  ?  ?  ?  ?Expected Discharge Date: 07/09/21               ?  ?  ?  ?  ?  ?  ?  ?  ?  ?  ? ? ?Social Determinants of Health (SDOH) Interventions ?  ? ?Readmission Risk Interventions ? ?  04/27/2021  ?  1:16 PM  ?Readmission Risk Prevention Plan  ?Post Dischage Appt Complete  ?Medication Screening Complete  ?Transportation Screening Complete  ? ? ?

## 2021-07-10 NOTE — Progress Notes (Signed)
[place] at [facility] ? ? ?PATIENT NAME: [name]   ? ?MR#:  [id number] ? ?DATE OF BIRTH:  04/21/1930 ? ?SUBJECTIVE:  ? ?patient earlier worked with physical therapy was able to ambulate up to hundred feet. Her sats remain stable on room air. Patient is keen on going back to [facility]. ? ? ?VITALS:  ?Blood pressure (!) 153/61, pulse (!) 50, temperature 97.7 ?F (36.5 ?C), resp. rate 20, height '5\' 4"'$  (1.626 m), weight 116.5 kg, SpO2 96 %. ? ?PHYSICAL EXAMINATION:  ? ?GENERAL:  88 y.o.-year-old patient lying in the bed with no acute distress. Obese ?LUNGS: Normal breath sounds bilaterally, no wheezing, rales, rhonchi.  ?CARDIOVASCULAR: S1, S2 normal. No murmurs, rubs, or gallops.  ?ABDOMEN: Soft, nontender, nondistended. Bowel sounds present.  ?EXTREMITIES: No  edema b/l.    ?NEUROLOGIC: nonfocal  patient is alert and awake ?SKIN: No obvious rash, lesion, or ulcer.  ? ?LABORATORY PANEL:  ?CBC ?Recent Labs  ?Lab 07/09/21 ?0501  ?WBC 8.2  ?HGB 12.8  ?HCT 41.6  ?PLT 248  ? ? ?Chemistries  ?Recent Labs  ?Lab 07/09/21 ?0501 07/10/21 ?4431  ?NA 138 136  ?K 4.1 3.9  ?CL 97* 99  ?CO2 34* 29  ?GLUCOSE 102* 94  ?BUN 36* 32*  ?CREATININE 0.94 0.94  ?CALCIUM 10.5* 10.3  ?MG 2.2 2.5*  ?AST 20  --   ?ALT 15  --   ?ALKPHOS 74  --   ?BILITOT 0.6  --   ? ? ?Assessment and Plan ?88 year old with history of diastolic CHF, asthma, COPD, GERD, HTN, hypothyroidism admitted for dyspnea, productive cough, nausea and diarrhea which started 3-4 days prior to admission.  Upon admission patient was diagnosed with COVID-19 and CHF exacerbation.  Chest x-ray showed mild pulmonary vascular congestion.  She was started on steroids, bronchodilators, IV Lasix.  CTA chest was negative for pulmonary embolism. She is euvolemic, IV lasix was stopped and transitioned to PO.  ? ?Acute on chronic diastolic CHF (congestive heart failure) ([place]), class III ?- Breathing as improved quite a bit.  ProCal Neg. overall appears  euvolemic therefore we will stop her IV Lasix today > transition to PO lasix upon dc.  ?- She had a 2D echo with EF of 60 to 65% last year. ?- Repeat echocardiogram - 55%, no Wall motion abnormality.  ?--sats >90% on RA and ambulation ?  ?COVID-19 virus infection ?- completed molnupiravir.   Continue bronchodilator.  ?-Elevated D-dimer.  CTA chest negative for PE but shows concerns for atypical infection. ?  ?Acute hypoxemic respiratory failure due to COVID-19 [facility]) ?- Combination of CHF and COVID-19.   ?  ?Atrial fibrillation ([place]) ?- Toprol-XL, Eliquis ?  ?Hypothyroidism ?- Synthroid 150 mcg daily ?  ?Asthma-COPD overlap syndrome ([place]) ?- Bronchodilators ?  ?Essential hypertension ?- Toprol-XL ? ? ? ?Procedures: ?Family communication : none ?Consults : none ?CODE STATUS: partial ?DVT Prophylaxis :SCD ?Level of care: Telemetry Cardiac ?Status is: Inpatient ?Remains inpatient appropriate because: patient is medically at baseline and ready for discharge. [facility] staff will come again to reassess. Physical therapy recommends home health. ?  ? ?TOTAL TIME TAKING CARE OF THIS PATIENT: 35 minutes.  ?>50% time spent on counselling and coordination of care ? ?Note: This dictation was prepared with Dragon dictation along with smaller phrase technology. Any transcriptional errors that result from this process are unintentional. ? ?Fritzi Mandes M.D  ? ? ?[facility]  ? ?CC: ?Primary care physician; [facility]  ?

## 2021-07-10 NOTE — Evaluation (Signed)
Occupational Therapy Re-Evaluation ?Patient Details ?Name: [name] ?MRN: [id number] ?DOB: 03-30-1930 ?Today's Date: 07/10/2021 ? ? ?History of Present Illness Pt is an 88 y/o F admitted on 07/04/21 for tx of dyspnea, productive cough, nausea & diarrhea, which started 3-4 days prior to admission. Pt was diagnosed with Covid-19 & COPD exacerbation. PMH: dCHF, asthma, COPD, GERD, HTN, hypothyroidism  ? ?Clinical Impression ?  ?Chart reviewed, pt greeted in chair agreeable to OT tx session. Re-eval order acknowledged. Pt with significantly improved cognition on this date and is alert and oriented x4, good awareness of deficits and general needs. Does appear to present with STM deficits. Pt continues to present with deficits in weakness, endurance, strength affecting optimal ADL completion. Pt is able to perform STS from chair and regular toilet height with supervision and use of grab bars. Improved performance noted in bathing on this date with MOD A required for LB bathing. MIN A required for UB bathing and MOD A required for toileting following BM. Pt vital signs stable on RA during all mobility. Pt reports she feels weak, but approaching baseline level of functioning as she amb with a rollator throughout ALF, has assist forADLs when needed and assist with IADLs. Pt would benefit from ongoing OT to address functional deficits and to improve safe completion with ADL. Pt is left as received, NAD, all needs met. Continue to recommend [place] following discharge. OT will continue to follow while admitted.  ?   ? ?Recommendations for follow up therapy are one component of a multi-disciplinary discharge planning process, led by the attending physician.  Recommendations may be updated based on patient status, additional functional criteria and insurance authorization.  ? ?Follow Up Recommendations ? Home health OT  ?  ?Assistance Recommended at Discharge Frequent or constant Supervision/Assistance  ?Patient can return home  with the following A little help with walking and/or transfers;A little help with bathing/dressing/bathroom;Assistance with cooking/housework;Help with stairs or ramp for entrance;Assist for transportation;Direct supervision/assist for medications management;Direct supervision/assist for financial management ? ?  ?Functional Status Assessment ? Patient has had a recent decline in their functional status and demonstrates the ability to make significant improvements in function in a reasonable and predictable amount of time.  ?Equipment Recommendations ? None recommended by OT (pt reports she has bsc and shower seat)  ?  ?Recommendations for Other Services   ? ? ?  ?Precautions / Restrictions Precautions ?Precautions: Fall ?Restrictions ?Weight Bearing Restrictions: No  ? ?  ? ?Mobility Bed Mobility ?  ?  ?  ?  ?  ?  ?  ?General bed mobility comments: NT pt in chair pre/post session ?  ? ?Transfers ?Overall transfer level: Needs assistance ?Equipment used: Rolling walker (2 wheels) ?Transfers: Sit to/from Stand ?Sit to Stand: Supervision ?  ?  ?  ?  ?  ?General transfer comment: no vcs required for hand placement, from chair and from regular toilet ?  ? ?  ?Balance Overall balance assessment: Needs assistance ?Sitting-balance support: Feet supported, Bilateral upper extremity supported ?Sitting balance-Leahy Scale: Good ?  ?  ?Standing balance support: Bilateral upper extremity supported, During functional activity ?Standing balance-Leahy Scale: Fair ?  ?  ?  ?  ?  ?  ?  ?  ?  ?  ?  ?  ?   ? ?ADL either performed or assessed with clinical judgement  ? ?ADL Overall ADL's : Needs assistance/impaired ?Eating/Feeding: Sitting;Set up ?  ?Grooming: Wash/dry hands;Sitting;Standing;Set up ?  ?Upper Body Bathing: Minimal  assistance;Sitting ?Upper Body Bathing Details (indicate cue type and reason): for thoroughness ?Lower Body Bathing: Sit to/from stand;Moderate assistance ?Lower Body Bathing Details (indicate cue type and  reason): for thoroughness ?Upper Body Dressing : Minimal assistance;Sitting ?  ?  ?  ?Toilet Transfer: Supervision/safety;Ambulation;Regular Toilet;Rolling walker (2 wheels) ?  ?Toileting- Water quality scientist and Hygiene: Sit to/from stand;Moderate assistance ?Toileting - Clothing Manipulation Details (indicate cue type and reason): for peri care for thoroughness ?  ?  ?Functional mobility during ADLs: Supervision/safety;Rolling walker (2 wheels) (chair to bathroom and back) ?   ? ? ? ?Vision Patient Visual Report: No change from baseline ?   ?   ?Perception   ?  ?Praxis   ?  ? ?Pertinent Vitals/Pain Pain Assessment ?Pain Assessment: No/denies pain  ? ? ? ?Hand Dominance   ?  ?Extremity/Trunk Assessment Upper Extremity Assessment ?Upper Extremity Assessment: Generalized weakness ?  ?Lower Extremity Assessment ?Lower Extremity Assessment: Generalized weakness ?  ?  ?  ?Communication   ?  ?Cognition Arousal/Alertness: Awake/alert ?Behavior During Therapy: [facility] for tasks assessed/performed ?Overall Cognitive Status: Within Functional Limits for tasks assessed ?  ?  ?  ?  ?  ?  ?  ?  ?  ?  ?  ?Memory: Decreased short-term memory ?Following Commands: Follows one step commands consistently, Follows multi-step commands with increased time ?  ?  ?  ?General Comments: Pt is alert, oriented x4, improved from OT eval; ?  ?  ?General Comments  spo2 >90% on RA throughout ? ?  ?Exercises   ?  ?Shoulder Instructions    ? ? ?Home Living Family/patient expects to be discharged to:: Assisted living ?  ?  ?  ?  ?  ?  ?  ?  ?  ?  ?  ?  ?  ?  ?Home Equipment: Rollator (4 wheels) ?  ?Additional Comments: per chart reivew, shower chair; pt is poor historian. ?  ? ?  ?Prior Functioning/Environment   ?  ?  ?  ?  ?  ?  ?Mobility Comments: Pt reports use of rollator around ALF ?ADLs Comments: assist for ADL/IADL from ALF ?  ? ?  ?  ?OT Problem List: Decreased strength;Decreased activity tolerance;Impaired balance (sitting and/or standing) ?   ?   ?OT Treatment/Interventions: Self-care/ADL training;Therapeutic exercise;Energy conservation;DME and/or AE instruction;Therapeutic activities;Balance training;Patient/family education  ?  ?OT Goals(Current goals can be found in the care plan section) Acute Rehab OT Goals ?Patient Stated Goal: go home ?OT Goal Formulation: With patient ?Time For Goal Achievement: 07/24/21 ?Potential to Achieve Goals: Good  ?OT Frequency: Min 2X/week ?  ? ?Co-evaluation   ?  ?  ?  ?  ? ?  ?AM-PAC OT "6 Clicks" Daily Activity     ?Outcome Measure Help from another person eating meals?: None ?Help from another person taking care of personal grooming?: None ?Help from another person toileting, which includes using toliet, bedpan, or urinal?: A Little ?Help from another person bathing (including washing, rinsing, drying)?: A Little ?Help from another person to put on and taking off regular upper body clothing?: A Little ?Help from another person to put on and taking off regular lower body clothing?: A Little ?6 Click Score: 20 ?  ?End of Session Equipment Utilized During Treatment: Rolling walker (2 wheels);Gait belt ?Nurse Communication: Mobility status ? ?Activity Tolerance: Patient tolerated treatment well ?Patient left: in chair;with call bell/phone within reach;with chair alarm set ? ?OT Visit Diagnosis: Unsteadiness on feet (R26.81);Muscle weakness (generalized) (  M62.81)  ?              ?Time: 8241-7530 ?OT Time Calculation (min): 24 min ?Charges:  OT General Charges ?$OT Visit: 1 Visit ?OT Evaluation ?$OT Re-eval: 1 Re-eval ?OT Treatments ?$Self Care/Home Management : 23-37 mins ? ?Shanon Payor, OTD OTR/L  ?07/10/21, 10:50 AM  ?

## 2021-07-11 DIAGNOSIS — I5033 Acute on chronic diastolic (congestive) heart failure: Secondary | ICD-10-CM | POA: Diagnosis not present

## 2021-07-11 LAB — BASIC METABOLIC PANEL
Anion gap: 7 (ref 5–15)
BUN: 36 mg/dL — ABNORMAL HIGH (ref 8–23)
CO2: 30 mmol/L (ref 22–32)
Calcium: 10.1 mg/dL (ref 8.9–10.3)
Chloride: 96 mmol/L — ABNORMAL LOW (ref 98–111)
Creatinine, Ser: 1.11 mg/dL — ABNORMAL HIGH (ref 0.44–1.00)
GFR, Estimated: 48 mL/min — ABNORMAL LOW (ref >60)
Glucose, Bld: 88 mg/dL (ref 70–99)
Potassium: 4.5 mmol/L (ref 3.5–5.1)
Sodium: 133 mmol/L — ABNORMAL LOW (ref 135–145)

## 2021-07-11 LAB — MAGNESIUM: Magnesium: 2.3 mg/dL (ref 1.7–2.4)

## 2021-07-11 NOTE — Plan of Care (Signed)
°  Problem: Education: °Goal: Ability to demonstrate management of disease process will improve °Outcome: Progressing °Goal: Ability to verbalize understanding of medication therapies will improve °Outcome: Progressing °Goal: Individualized Educational Video(s) °Outcome: Progressing °  °Problem: Activity: °Goal: Capacity to carry out activities will improve °Outcome: Progressing °  °Problem: Cardiac: °Goal: Ability to achieve and maintain adequate cardiopulmonary perfusion will improve °Outcome: Progressing °  °Problem: Education: °Goal: Knowledge of General Education information will improve °Description: Including pain rating scale, medication(s)/side effects and non-pharmacologic comfort measures °Outcome: Progressing °  °Problem: Health Behavior/Discharge Planning: °Goal: Ability to manage health-related needs will improve °Outcome: Progressing °  °Problem: Clinical Measurements: °Goal: Ability to maintain clinical measurements within normal limits will improve °Outcome: Progressing °Goal: Will remain free from infection °Outcome: Progressing °Goal: Diagnostic test results will improve °Outcome: Progressing °Goal: Respiratory complications will improve °Outcome: Progressing °Goal: Cardiovascular complication will be avoided °Outcome: Progressing °  °Problem: Activity: °Goal: Risk for activity intolerance will decrease °Outcome: Progressing °  °Problem: Nutrition: °Goal: Adequate nutrition will be maintained °Outcome: Progressing °  °Problem: Coping: °Goal: Level of anxiety will decrease °Outcome: Progressing °  °Problem: Elimination: °Goal: Will not experience complications related to bowel motility °Outcome: Progressing °Goal: Will not experience complications related to urinary retention °Outcome: Progressing °  °Problem: Pain Managment: °Goal: General experience of comfort will improve °Outcome: Progressing °  °Problem: Safety: °Goal: Ability to remain free from injury will improve °Outcome: Progressing °   °Problem: Skin Integrity: °Goal: Risk for impaired skin integrity will decrease °Outcome: Progressing °  °Problem: Education: °Goal: Knowledge of risk factors and measures for prevention of condition will improve °Outcome: Progressing °  °Problem: Coping: °Goal: Psychosocial and spiritual needs will be supported °Outcome: Progressing °  °Problem: Respiratory: °Goal: Will maintain a patent airway °Outcome: Progressing °Goal: Complications related to the disease process, condition or treatment will be avoided or minimized °Outcome: Progressing °  °

## 2021-07-11 NOTE — TOC Progression Note (Signed)
Transition of Care [facility]) - Progression Note    Patient Details  Name: [name] MRN: [id number] Date of Birth: [birth year]  [facility] CM/SW Contact  Laurena Slimmer, RN Phone Number: 07/11/2021, 3:35 PM  Clinical Narrative:    Phoebe Perch sent to [facility] per request.         Expected Discharge Plan and Services           Expected Discharge Date: 07/09/21                                     Social Determinants of Health (SDOH) Interventions    Readmission Risk Interventions    04/27/2021    1:16 PM  Readmission Risk Prevention Plan  Post Dischage Appt Complete  Medication Screening Complete  Transportation Screening Complete

## 2021-07-11 NOTE — Progress Notes (Signed)
Secure chat with Dr Posey Pronto.  Pt currently not receiving IV medications.  Orders ok to leave IV out.

## 2021-07-11 NOTE — Progress Notes (Signed)
[place] at [place] NAME: Myrlene Riera    MR#:  [id number]  DATE OF BIRTH:  Aug 26, 1930  SUBJECTIVE:   patient earlier worked with physical therapy was able to ambulate up to hundred feet. Her sats remain stable on room air. Patient is keen on going back to [facility]. Mild chronic cough   VITALS:  Blood pressure 116/71, pulse 86, temperature 98.2 F (36.8 C), resp. rate 19, height '5\' 4"'$  (1.626 m), weight 118.9 kg, SpO2 95 %.  PHYSICAL EXAMINATION:   GENERAL:  88 y.o.-year-old patient lying in the bed with no acute distress. Obese LUNGS: Normal breath sounds bilaterally, no wheezing, rales, rhonchi.  CARDIOVASCULAR: S1, S2 normal. No murmurs, rubs, or gallops.  ABDOMEN: Soft, nontender, nondistended. Bowel sounds present.  EXTREMITIES: No  edema b/l.    NEUROLOGIC: nonfocal  patient is alert and awake SKIN: No obvious rash, lesion, or ulcer.   LABORATORY PANEL:  CBC Recent Labs  Lab 07/09/21 0501  WBC 8.2  HGB 12.8  HCT 41.6  PLT 248     Chemistries  Recent Labs  Lab 07/09/21 0501 07/10/21 0653 07/11/21 0640  NA 138   < > 133*  K 4.1   < > 4.5  CL 97*   < > 96*  CO2 34*   < > 30  GLUCOSE 102*   < > 88  BUN 36*   < > 36*  CREATININE 0.94   < > 1.11*  CALCIUM 10.5*   < > 10.1  MG 2.2   < > 2.3  AST 20  --   --   ALT 15  --   --   ALKPHOS 74  --   --   BILITOT 0.6  --   --    < > = values in this interval not displayed.     Assessment and Plan 88 year old with history of diastolic CHF, asthma, COPD, GERD, HTN, hypothyroidism admitted for dyspnea, productive cough, nausea and diarrhea which started 3-4 days prior to admission.  Upon admission patient was diagnosed with COVID-19 and CHF exacerbation.  Chest x-ray showed mild pulmonary vascular congestion.  She was started on steroids, bronchodilators, IV Lasix.  CTA chest was negative for pulmonary embolism. She is euvolemic, IV lasix was stopped and transitioned to  PO.   Acute on chronic diastolic CHF (congestive heart failure) ([place]), class III - Breathing as improved quite a bit.  ProCal Neg. overall appears euvolemic therefore we will stop her IV Lasix today > transition to PO lasix upon dc.  - She had a 2D echo with EF of 60 to 65% last year. - Repeat echocardiogram - 55%, no Wall motion abnormality.  --sats >90% on RA and ambulation   COVID-19 virus infection - completed molnupiravir.   Continue bronchodilator.  -Elevated D-dimer.  CTA chest negative for PE but shows concerns for atypical infection.   Acute hypoxemic respiratory failure due to COVID-19 [facility]) - Combination of CHF and COVID-19.     Atrial fibrillation (H[facility]) - Toprol-XL, Eliquis   Hypothyroidism - Synthroid 150 mcg daily   Asthma-COPD overlap syndrome ([facility]CC) - Bronchodilators   Essential hypertension - Toprol-XL    Procedures: Family communication : none Consults : none CODE STATUS: partial DVT Prophylaxis :SCD Level of care: Telemetry Cardiac Status is: Inpatient Remains inpatient appropriate because: patient is medically at baseline and ready for discharge. [facility] staff came and will take pt tomorrow 5/19 per [facility]  Physical therapy recommends home health.    TOTAL TIME TAKING CARE OF THIS PATIENT: 35 minutes.  >50% time spent on counselling and coordination of care  Note: This dictation was prepared with Dragon dictation along with smaller phrase technology. Any transcriptional errors that result from this process are unintentional.  Fritzi Mandes M.D    [facility]   CC: Primary care physician; [facility]

## 2021-07-11 NOTE — NC FL2 (Signed)
[place] LEVEL OF CARE SCREENING TOOL     IDENTIFICATION  Patient Name: [name] Birthdate: 13-May-1930 Sex: female Admission Date (Current Location): 07/04/2021  [facility] [place] Number:  Engineering geologist and Address:  [facility], [address] [place], [place] [address]      Provider Number: [id number]  Attending Physician Name and Address:  Fritzi Mandes, MD  Relative Name and Phone Number:  Marthe Patch [phone]    Current Level of Care: Hospital Recommended Level of Care: [place] Prior Approval Number:    Date Approved/Denied:   PASRR Number:    Discharge Plan: Other (Comment) (ALF)    Current Diagnoses: Patient Active Problem List   Diagnosis Date Noted   COVID-19 virus infection 07/04/2021   Acute hypoxemic respiratory failure due to COVID-19 ([place]) 07/04/2021   Asthma-COPD overlap syndrome ([place]) 07/04/2021   Frequent falls 04/26/2021   Nausea vomiting and diarrhea 02/25/2021   Atrial fibrillation with RVR ([place]) 02/25/2021   Acute renal failure superimposed on stage 3a chronic kidney disease ([place]) 02/25/2021   Chronic diastolic CHF (congestive heart failure) ([place]) 02/25/2021   Severe sepsis ([place]) 02/25/2021   Lower extremity cellulitis 02/25/2021   Allergic rhinitis 08/10/2020   Peripheral neuropathy 08/10/2020   Acute delirium    Hypomagnesemia    Sepsis due to group B Streptococcus without acute organ dysfunction ([place])    Acute on chronic diastolic CHF (congestive heart failure) ([place])    Atrial fibrillation, chronic ([place])    Depression    Sepsis secondary to UTI ([place]) 07/05/2020   COPD with acute exacerbation (HCC)    Hypothyroidism    Hypertension    CKD (chronic kidney disease) stage 3, GFR 30-59 ml/min (HCC)    Chronic kidney disease 06/21/2020   Secondary hyperaldosteronism ([place]) 06/16/2020   Stasis dermatitis of both legs 07/28/2017   Pruritic rash 07/03/2017   Gait  instability 05/27/2017   CHF (congestive heart failure) ([place]) 03/27/2017   Lower extremity ulceration, left, limited to breakdown of skin ([place]) 03/27/2017   High risk medication use 03/12/2017   Primary hyperparathyroidism ([place]) 03/12/2017   Impaired glucose tolerance 03/12/2017   Noncompliance 03/12/2017   Parathyroid adenoma 03/12/2017   Chronic renal insufficiency, stage 3 (moderate) ([place]) 03/12/2017   Mild reactive airways disease 03/08/2016   Influenza 03/06/2016   Atrial fibrillation ([place]) 03/06/2016   Morbid obesity with BMI of 45.0-49.9, adult ([place]) 02/18/2016   OSA (obstructive sleep apnea) 02/18/2016   CKD (chronic kidney disease), stage II 02/12/2016   Swelling of limb 02/12/2016   Pain in toes of both feet 01/14/2016   Varicose veins of both lower extremities with pain 01/14/2016   Lymphedema 01/14/2016   Longstanding persistent atrial fibrillation ([place]) 10/04/2015   Atrophic vaginitis 12/13/2014   Hypercalcemia 12/13/2014   Hyperparathyroidism ([place]) 12/13/2014   Chronic UTI (urinary tract infection) 11/29/2014   Hyperlipidemia 01/03/2014   Lymphedema of both lower extremities 12/08/2013   SOB (shortness of breath) 12/08/2013   Anxiety 06/15/2013   GERD (gastroesophageal reflux disease) 06/15/2013   Osteoarthritis 06/15/2013   COPD (chronic obstructive pulmonary disease) ([place]) 06/15/2013   Essential hypertension 06/15/2013   Incomplete emptying of bladder 05/25/2013   Mixed incontinence 51/03/5850   Renal colic 77/82/4235   Renal cyst, acquired 05/25/2013   Sciatica 05/25/2013   Chronic cystitis 05/25/2013    Orientation RESPIRATION BLADDER Height & Weight     Self, Time, Situation, Place  Normal External catheter Weight: 118.9  kg Height:  '5\' 4"'$  (162.6 cm)  BEHAVIORAL SYMPTOMS/MOOD NEUROLOGICAL BOWEL NUTRITION STATUS      Continent Diet (Heart)  AMBULATORY STATUS COMMUNICATION OF NEEDS Skin   Limited Assist Verbally Normal (Erythema right arm)                        Personal Care Assistance Level of Assistance  Bathing, Dressing, Feeding Bathing Assistance: Limited assistance Feeding assistance: Independent Dressing Assistance: Limited assistance     Functional Limitations Info  Sight, Hearing Sight Info: Impaired Hearing Info: Impaired      SPECIAL CARE FACTORS FREQUENCY  PT (By licensed PT)     PT Frequency: more than 2x per week              Contractures Contractures Info: Not present    Additional Factors Info  Code Status, Allergies Code Status Info: Partial Allergies Info: Prednisone, Aspirin, Sulfa Antibiotics, Sulfacetamide Sodium           Current Medications (07/11/2021):  This is the current hospital active medication list Current Facility-Administered Medications  Medication Dose Route Frequency Provider Last Rate Last Admin   acetaminophen (TYLENOL) tablet 650 mg  650 mg Oral Q6H PRN Mansy, Jan A, MD   650 mg at 07/10/21 2133   Or   acetaminophen (TYLENOL) suppository 650 mg  650 mg Rectal Q6H PRN Mansy, Jan A, MD       apixaban Arne [place]) tablet 5 mg  5 mg Oral BID Mansy, Jan A, MD   5 mg at 07/11/21 1031   ascorbic acid (VITAMIN C) tablet 500 mg  500 mg Oral Daily Mansy, Jan A, MD   500 mg at 07/11/21 1032   chlorpheniramine-HYDROcodone 10-8 MG/5ML suspension 5 mL  5 mL Oral Q12H PRN Mansy, Jan A, MD       cholecalciferol (VITAMIN D3) tablet 1,000 Units  1,000 Units Oral Daily Mansy, Jan A, MD   1,000 Units at 07/11/21 1031   diclofenac Sodium (VOLTAREN) 1 % topical gel 2 g  2 g Topical QID Mansy, Jan A, MD   2 g at 07/11/21 1458   DULoxetine (CYMBALTA) DR capsule 60 mg  60 mg Oral Daily Mansy, Jan A, MD   60 mg at 07/11/21 1031   famotidine (PEPCID) tablet 20 mg  20 mg Oral QHS Mansy, Jan A, MD   20 mg at 07/10/21 2133   guaiFENesin-dextromethorphan (ROBITUSSIN DM) 100-10 MG/5ML syrup 10 mL  10 mL Oral Q4H PRN Mansy, Jan A, MD   10 mL at 07/11/21 1459   hydrALAZINE (APRESOLINE) injection 10 mg  10  mg Intravenous Q4H PRN Amin, Ankit Chirag, MD       hydrOXYzine (ATARAX) tablet 10 mg  10 mg Oral BID Mansy, Jan A, MD   10 mg at 07/11/21 1032   Ipratropium-Albuterol (COMBIVENT) respimat 1 puff  1 puff Inhalation Q6H Amin, Ankit Chirag, MD   1 puff at 07/11/21 1459   ipratropium-albuterol (DUONEB) 0.5-2.5 (3) MG/3ML nebulizer solution 3 mL  3 mL Nebulization Q6H PRN Mansy, Jan A, MD       levothyroxine (SYNTHROID) tablet 150 mcg  150 mcg Oral Daily Mansy, Jan A, MD   150 mcg at 07/11/21 0548   loratadine (CLARITIN) tablet 10 mg  10 mg Oral Daily Mansy, Jan A, MD   10 mg at 07/11/21 1031   magnesium hydroxide (MILK OF MAGNESIA) suspension 30 mL  30 mL Oral Daily PRN Mansy, Arvella Merles, MD  metoprolol succinate (TOPROL-XL) 24 hr tablet 12.5 mg  12.5 mg Oral QHS Mansy, Jan A, MD   12.5 mg at 07/10/21 2134   metoprolol tartrate (LOPRESSOR) injection 5 mg  5 mg Intravenous Q4H PRN Amin, Ankit Chirag, MD       montelukast (SINGULAIR) tablet 10 mg  10 mg Oral Daily Mansy, Jan A, MD   10 mg at 07/10/21 2133   ondansetron (ZOFRAN) tablet 4 mg  4 mg Oral Q6H PRN Mansy, Jan A, MD       Or   ondansetron [facility]) injection 4 mg  4 mg Intravenous Q6H PRN Mansy, Jan A, MD       senna-docusate (Senokot-S) tablet 1 tablet  1 tablet Oral QHS PRN Damita Lack, MD       traZODone (DESYREL) tablet 25 mg  25 mg Oral QHS PRN Mansy, Jan A, MD   25 mg at 07/10/21 2140   vitamin B-12 (CYANOCOBALAMIN) tablet 500 mcg  500 mcg Oral Daily Mansy, Jan A, MD   500 mcg at 07/11/21 1031   Vitamin D (Ergocalciferol) (DRISDOL) capsule 50,000 Units  50,000 Units Oral Q7 days Mansy, Jan A, MD   50,000 Units at 07/06/21 1059   zinc sulfate capsule 220 mg  220 mg Oral Daily Mansy, Jan A, MD   220 mg at 07/11/21 1031     Discharge Medications: Please see discharge summary for a list of discharge medications.  Relevant Imaging Results:  Relevant Lab Results:   Additional Information Patient SS# [id number]  Laurena Slimmer, RN

## 2021-07-12 DIAGNOSIS — I10 Essential (primary) hypertension: Secondary | ICD-10-CM | POA: Diagnosis not present

## 2021-07-12 DIAGNOSIS — I482 Chronic atrial fibrillation, unspecified: Secondary | ICD-10-CM | POA: Diagnosis not present

## 2021-07-12 DIAGNOSIS — U071 COVID-19: Secondary | ICD-10-CM | POA: Diagnosis not present

## 2021-07-12 DIAGNOSIS — I5033 Acute on chronic diastolic (congestive) heart failure: Secondary | ICD-10-CM | POA: Diagnosis not present

## 2021-07-12 LAB — BASIC METABOLIC PANEL
Anion gap: 6 (ref 5–15)
BUN: 27 mg/dL — ABNORMAL HIGH (ref 8–23)
CO2: 28 mmol/L (ref 22–32)
Calcium: 9.6 mg/dL (ref 8.9–10.3)
Chloride: 98 mmol/L (ref 98–111)
Creatinine, Ser: 1.1 mg/dL — ABNORMAL HIGH (ref 0.44–1.00)
GFR, Estimated: 48 mL/min — ABNORMAL LOW (ref >60)
Glucose, Bld: 104 mg/dL — ABNORMAL HIGH (ref 70–99)
Potassium: 4.4 mmol/L (ref 3.5–5.1)
Sodium: 132 mmol/L — ABNORMAL LOW (ref 135–145)

## 2021-07-12 LAB — MAGNESIUM: Magnesium: 2.2 mg/dL (ref 1.7–2.4)

## 2021-07-12 NOTE — Discharge Summary (Signed)
Physician Discharge Summary   Patient: [name] MRN: [id number] DOB: [birth year]  Admit date:     07/04/2021  Discharge date: 07/12/21  Discharge Physician: Fritzi Mandes   PCP: [facility]   Recommendations at discharge:    F/u PCP in 1-2 weeks  Discharge Diagnoses: Acute on chronic Diastolic CHF COVID infection--completed antivrial rx  Hospital Course:  88 year old with history of diastolic CHF, asthma, COPD, GERD, HTN, hypothyroidism admitted for dyspnea, productive cough, nausea and diarrhea which started 3-4 days prior to admission.  Upon admission patient was diagnosed with COVID-19 and CHF exacerbation.  Chest x-ray showed mild pulmonary vascular congestion.  She was started on steroids, bronchodilators, IV Lasix.  CTA chest was negative for pulmonary embolism. She is euvolemic, IV lasix was stopped and transitioned to PO.    Acute on chronic diastolic CHF (congestive heart failure) ([place]), class III - Breathing as improved quite a bit.  ProCal Neg. overall appears euvolemic therefore we will stopped IV Lasix  > transition to PO lasix upon dc.  - She had a 2D echo with EF of 60 to 65% last year. - Repeat echocardiogram - 55%, no Wall motion abnormality.  --sats >90% on RA and ambulation   COVID-19 virus infection - completed molnupiravir.   Continue bronchodilator.  -Elevated D-dimer.  CTA chest negative for PE but shows concerns for atypical infection.   Acute hypoxemic respiratory failure due to COVID-19 [facility]) - Combination of CHF and COVID-19.     Atrial fibrillation (HCC) - Toprol-XL, Eliquis   Hypothyroidism - Synthroid 150 mcg daily   Asthma-COPD overlap syndrome (HCC) - Bronchodilators   Essential hypertension - Toprol-XL     Spoke with dter [employer]  on the phone Consultants: none Disposition: Assisted living Diet recommendation:  Discharge Diet Orders (From admission, onward)     Start     Ordered   07/12/21 0000  Diet - low  sodium heart healthy        07/12/21 0836           Cardiac diet DISCHARGE MEDICATION: Allergies as of 07/12/2021       Reactions   Prednisone Other (See Comments)   Irritability   Aspirin Nausea Only   Sulfa Antibiotics Rash   Sulfacetamide Sodium Rash        Medication List     STOP taking these medications    molnupiravir EUA 200 mg Caps capsule Commonly known as: LAGEVRIO       TAKE these medications    acetaminophen 500 MG tablet Commonly known as: TYLENOL Take 500-1,000 mg by mouth every 6 (six) hours as needed for mild pain or moderate pain.   apixaban 5 MG Tabs tablet Commonly known as: ELIQUIS Take 5 mg by mouth 2 (two) times daily.   benzonatate 100 MG capsule Commonly known as: TESSALON Take 1 capsule by mouth 3 (three) times daily as needed.   Cholecalciferol 1.25 MG (50000 UT) Tabs Take by mouth.   diclofenac Sodium 1 % Gel Commonly known as: VOLTAREN Apply 2 g topically 4 (four) times daily.   DULoxetine 60 MG capsule Commonly known as: CYMBALTA Take 60 mg by mouth daily.   famotidine 20 MG tablet Commonly known as: PEPCID Take 1 tablet by mouth at bedtime.   feeding supplement (PRO-STAT SUGAR FREE 64) Liqd Take 30 mLs by mouth daily.   furosemide 20 MG tablet Commonly known as: LASIX Take 20 mg by mouth 2 (two) times daily.   hydrOXYzine  10 MG tablet Commonly known as: ATARAX Take 10 mg by mouth 2 (two) times daily.   ipratropium-albuterol 0.5-2.5 (3) MG/3ML Soln Commonly known as: DUONEB Take 3 mLs by nebulization every 6 (six) hours as needed (shortness of breath).   levothyroxine 150 MCG tablet Commonly known as: SYNTHROID Take 150 mcg by mouth daily.   loratadine 10 MG tablet Commonly known as: CLARITIN Take 10 mg by mouth daily.   metoprolol succinate 25 MG 24 hr tablet Commonly known as: TOPROL-XL Take 0.5 tablets (12.5 mg total) by mouth at bedtime.   montelukast 10 MG tablet Commonly known as:  SINGULAIR Take 10 mg by mouth daily.   PreserVision AREDS 2+Multi Vit Caps Take 1 capsule by mouth as directed.   therapeutic multivitamin-minerals tablet Take 1 tablet by mouth daily.   traZODone 50 MG tablet Commonly known as: DESYREL Take 1 tablet (50 mg total) by mouth at bedtime as needed for sleep.   triamcinolone cream 0.1 % Commonly known as: KENALOG Apply 1 application topically 2 (two) times daily as needed.   Ventolin HFA 108 (90 Base) MCG/ACT inhaler Generic drug: albuterol Inhale 2 puffs into the lungs every 6 (six) hours as needed for wheezing or shortness of breath.   vitamin B-12 500 MCG tablet Commonly known as: CYANOCOBALAMIN Take 500 mcg by mouth daily.   Vitamin D (Ergocalciferol) 1.25 MG (50000 UNIT) Caps capsule Commonly known as: DRISDOL Take 50,000 Units by mouth every 7 (seven) days. Saturday        Follow-up Information     [facility] Follow up in 1 week(s).   Specialty: Geriatric Medicine Contact information: [address] [place] [address] [phone]                Discharge Exam: Filed Weights   07/10/21 0500 07/11/21 0500 07/12/21 0600  Weight: 116.5 kg 118.9 kg 116.7 kg     Condition at discharge: fair  The results of significant diagnostics from this hospitalization (including imaging, microbiology, ancillary and laboratory) are listed below for reference.   Imaging Studies: DG Chest 2 View  Result Date: 07/04/2021 CLINICAL DATA:  Short of breath EXAM: CHEST - 2 VIEW COMPARISON:  06/30/2021 FINDINGS: Prominent right heart border similar to the prior study. Mild vascular congestion. No edema. Mild left lower lobe atelectasis similar. No effusion. IMPRESSION: Mild vascular congestion without edema Mild left lower lobe atelectasis. Electronically Signed   By: Franchot Gallo M.D.   On: 07/04/2021 16:31   CT HEAD WO CONTRAST (5MM)  Result Date: 06/30/2021 CLINICAL DATA:  Altered mental  status. EXAM: CT HEAD WITHOUT CONTRAST TECHNIQUE: Contiguous axial images were obtained from the base of the skull through the vertex without intravenous contrast. RADIATION DOSE REDUCTION: This exam was performed according to the departmental dose-optimization program which includes automated exposure control, adjustment of the mA and/or kV according to patient size and/or use of iterative reconstruction technique. COMPARISON:  April 26, 2021 FINDINGS: Brain: There is mild cerebral atrophy with widening of the extra-axial spaces and ventricular dilatation. There are areas of decreased attenuation within the white matter tracts of the supratentorial brain, consistent with microvascular disease changes. Vascular: No hyperdense vessel or unexpected calcification. Skull: Normal. Negative for fracture or focal lesion. Sinuses/Orbits: No acute finding. Other: None. IMPRESSION: 1. No acute intracranial abnormality. 2. Generalized cerebral atrophy with chronic white matter small vessel ischemic changes. Electronically Signed   By: Virgina Norfolk M.D.   On: 06/30/2021 20:20  CT Angio Chest Pulmonary Embolism (PE) W or WO Contrast  Result Date: 07/07/2021 CLINICAL DATA:  Shortness of breath. High probability for acute pulmonary embolus. EXAM: CT ANGIOGRAPHY CHEST WITH CONTRAST TECHNIQUE: Multidetector CT imaging of the chest was performed using the standard protocol during bolus administration of intravenous contrast. Multiplanar CT image reconstructions and MIPs were obtained to evaluate the vascular anatomy. RADIATION DOSE REDUCTION: This exam was performed according to the departmental dose-optimization program which includes automated exposure control, adjustment of the mA and/or kV according to patient size and/or use of iterative reconstruction technique. CONTRAST:  74m OMNIPAQUE IOHEXOL 350 MG/ML SOLN COMPARISON:  None FINDINGS: Cardiovascular: Satisfactory opacification of the pulmonary arteries to the  segmental level. No evidence of pulmonary embolism. Aortic atherosclerosis. Normal heart size. No pericardial effusion. Mediastinum/Nodes: No enlarged mediastinal, hilar, or axillary lymph nodes. Thyroid gland, trachea, and esophagus demonstrate no significant findings. Lungs/Pleura: There is no pleural effusion. Bilateral patchy upper and lower lobe areas of ground-glass attenuation identified. There are also bilateral, lower lung zone predominant areas of peribronchial thickening with thickening of the peribronchovascular interstitium. Peripheral predominant areas of lower lobe interstitial reticulation are also noted. No signs of lobar consolidation or pneumothorax. No suspicious pulmonary nodule or mass. Upper Abdomen: Nodular contour the liver is suggestive of cirrhosis. Cholecystectomy. Musculoskeletal: Spondylosis within the thoracic spine. Review of the MIP images confirms the above findings. IMPRESSION: 1. No evidence for acute pulmonary embolism. 2. Bilateral patchy upper and lower lobe predominant areas of ground-glass attenuation with peribronchial thickening and thickening of the peribronchovascular interstitium. Imaging findings are concerning for acute inflammatory/infectious process. Atypical viral infection is not excluded. 3. Nodular contour the liver suggestive of cirrhosis. 4. Aortic Atherosclerosis (ICD10-I70.0). Electronically Signed   By: TKerby MoorsM.D.   On: 07/07/2021 10:30   DG Chest Port 1 View  Result Date: 06/30/2021 CLINICAL DATA:  Altered mental status EXAM: PORTABLE CHEST 1 VIEW COMPARISON:  Chest x-ray dated Jun 30, 2021 FINDINGS: Unchanged enlarged cardiac and mediastinal contours. Lungs are clear. No large pleural effusion or pneumothorax. IMPRESSION: No active disease. Electronically Signed   By: LYetta GlassmanM.D.   On: 06/30/2021 17:35   ECHOCARDIOGRAM COMPLETE  Result Date: 07/07/2021    ECHOCARDIOGRAM REPORT   Patient Name:   MORIYAH LAMPHEARDate of Exam:  07/07/2021 Medical Rec #:  [id number]     Height:       64.0 in Accession #:    [id number]    Weight:       258.2 lb Date of Birth:  [birth year]     BSA:          2.181 m Patient Age:    89years       BP:           98/45 mmHg Patient Gender: F              HR:           63 bpm. Exam Location:  Inpatient Procedure: 2D Echo, Cardiac Doppler, Color Doppler and Intracardiac            Opacification Agent Indications:     Cardiomyopathy-Unspecified I42.9  History:         Patient has prior history of Echocardiogram examinations, most                  recent 07/07/2020. COPD and PAD; Risk Factors:Former Smoker and  Hypertension. Covid. Thyroid disease. GERD.  Sonographer:     Darlina Sicilian RDCS Referring Phys:  [id number] Jeanella Flattery AMIN Diagnosing Phys: Ida Rogue MD  Sonographer Comments: Suboptimal parasternal window and suboptimal apical window. IMPRESSIONS  1. Left ventricular ejection fraction, by estimation, is 55 to 60%. The left ventricle has normal function. The left ventricle has no regional wall motion abnormalities. Left ventricular diastolic parameters are indeterminate.  2. Right ventricular systolic function is normal. The right ventricular size is normal.  3. The mitral valve was not well visualized. Mild mitral valve regurgitation. No evidence of mitral stenosis.  4. The aortic valve was not well visualized. Aortic valve regurgitation is not visualized. No aortic stenosis is present.  5. The inferior vena cava is normal in size with greater than 50% respiratory variability, suggesting right atrial pressure of 3 mmHg. FINDINGS  Left Ventricle: Left ventricular ejection fraction, by estimation, is 55 to 60%. The left ventricle has normal function. The left ventricle has no regional wall motion abnormalities. The left ventricular internal cavity size was normal in size. There is  no left ventricular hypertrophy. Left ventricular diastolic parameters are indeterminate. Right Ventricle:  The right ventricular size is normal. No increase in right ventricular wall thickness. Right ventricular systolic function is normal. Left Atrium: Left atrial size was normal in size. Right Atrium: Right atrial size was normal in size. Pericardium: There is no evidence of pericardial effusion. Mitral Valve: The mitral valve was not well visualized. Mild mitral valve regurgitation. No evidence of mitral valve stenosis. Tricuspid Valve: The tricuspid valve is not well visualized. Tricuspid valve regurgitation is mild . No evidence of tricuspid stenosis. Aortic Valve: The aortic valve was not well visualized. Aortic valve regurgitation is not visualized. No aortic stenosis is present. Pulmonic Valve: The pulmonic valve was not well visualized. Pulmonic valve regurgitation is not visualized. No evidence of pulmonic stenosis. Aorta: The aortic root was not well visualized, aortic root could not be assessed and the ascending aorta was not well visualized. Venous: The inferior vena cava is normal in size with greater than 50% respiratory variability, suggesting right atrial pressure of 3 mmHg. IAS/Shunts: No atrial level shunt detected by color flow Doppler.  LEFT VENTRICLE PLAX 2D LVIDd:         4.30 cm      Diastology LVIDs:         2.80 cm      LV e' medial:    4.65 cm/s LV PW:         1.00 cm      LV E/e' medial:  21.0 LV IVS:        1.20 cm      LV e' lateral:   6.45 cm/s LVOT diam:     1.90 cm      LV E/e' lateral: 15.1 LV SV:         48 LV SV Index:   22 LVOT Area:     2.84 cm  LV Volumes (MOD) LV vol d, MOD A2C: 92.1 ml LV vol d, MOD A4C: 117.0 ml LV vol s, MOD A2C: 48.1 ml LV vol s, MOD A4C: 44.4 ml LV SV MOD A2C:     44.0 ml LV SV MOD A4C:     117.0 ml LV SV MOD BP:      57.7 ml RIGHT VENTRICLE RV S prime:     7.50 cm/s TAPSE (M-mode): 1.4 cm LEFT ATRIUM  Index        RIGHT ATRIUM          Index LA diam:      3.50 cm 1.61 cm/m   RA Area:     8.98 cm LA Vol (A2C): 45.8 ml 21.00 ml/m  RA Volume:    15.80 ml 7.25 ml/m LA Vol (A4C): 59.8 ml 27.42 ml/m  AORTIC VALVE LVOT Vmax:   78.65 cm/s LVOT Vmean:  49.200 cm/s LVOT VTI:    0.169 m  AORTA Ao Root diam: 3.00 cm Ao Asc diam:  3.40 cm MITRAL VALVE MV Area (PHT): 5.42 cm    SHUNTS MV Decel Time: 140 msec    Systemic VTI:  0.17 m MV E velocity: 97.55 cm/s  Systemic Diam: 1.90 cm Ida Rogue MD Electronically signed by Ida Rogue MD Signature Date/Time: 07/07/2021/1:19:06 PM    Final     Microbiology: Results for orders placed or performed during the hospital encounter of 07/04/21  Resp Panel by RT-PCR (Flu A&B, Covid) Nasopharyngeal Swab     Status: Abnormal   Collection Time: 07/04/21  3:47 PM   Specimen: Nasopharyngeal Swab; Nasopharyngeal(NP) swabs in vial transport medium  Result Value Ref Range Status   SARS Coronavirus 2 by RT PCR POSITIVE (A) NEGATIVE Final    Comment: (NOTE) SARS-CoV-2 target nucleic acids are DETECTED.  The SARS-CoV-2 RNA is generally detectable in upper respiratory specimens during the acute phase of infection. Positive results are indicative of the presence of the identified virus, but do not rule out bacterial infection or co-infection with other pathogens not detected by the test. Clinical correlation with patient history and other diagnostic information is necessary to determine patient infection status. The expected result is Negative.  Fact Sheet for Patients: EntrepreneurPulse.com.au  Fact Sheet for Healthcare Providers: IncredibleEmployment.be  This test is not yet approved or cleared by the [place] FDA and  has been authorized for detection and/or diagnosis of SARS-CoV-2 by FDA under an Emergency Use Authorization (EUA).  This EUA will remain in effect (meaning this test can be used) for the duration of  the COVID-19 declaration under Section 564(b)(1) of the A ct, 21 U.S.C. section 360bbb-3(b)(1), unless the authorization is terminated or revoked  sooner.     Influenza A by PCR NEGATIVE NEGATIVE Final   Influenza B by PCR NEGATIVE NEGATIVE Final    Comment: (NOTE) The Xpert Xpress SARS-CoV-2/FLU/RSV plus assay is intended as an aid in the diagnosis of influenza from Nasopharyngeal swab specimens and should not be used as a sole basis for treatment. Nasal washings and aspirates are unacceptable for Xpert Xpress SARS-CoV-2/FLU/RSV testing.  Fact Sheet for Patients: EntrepreneurPulse.com.au  Fact Sheet for Healthcare Providers: IncredibleEmployment.be  This test is not yet approved or cleared by the [place] FDA and has been authorized for detection and/or diagnosis of SARS-CoV-2 by FDA under an Emergency Use Authorization (EUA). This EUA will remain in effect (meaning this test can be used) for the duration of the COVID-19 declaration under Section 564(b)(1) of the Act, 21 U.S.C. section 360bbb-3(b)(1), unless the authorization is terminated or revoked.  Performed at [facility], [address] [place], [place] [address]     Labs: CBC: Recent Labs  Lab 07/06/21 0700 07/07/21 0440 07/08/21 0458 07/09/21 0501  WBC 7.1 11.5* 10.1 8.2  NEUTROABS 5.4 8.0* 6.5 4.7  HGB 11.7* 12.3 12.8 12.8  HCT 37.6 39.4 40.9 41.6  MCV 79.2* 80.2 80.0 79.5*  PLT 246 269 244 248  Basic Metabolic Panel: Recent Labs  Lab 07/08/21 0458 07/09/21 0501 07/10/21 0653 07/11/21 0640 07/12/21 0646  NA 139 138 136 133* 132*  K 4.2 4.1 3.9 4.5 4.4  CL 98 97* 99 96* 98  CO2 33* 34* '29 30 28  '$ GLUCOSE 109* 102* 94 88 104*  BUN 40* 36* 32* 36* 27*  CREATININE 1.04* 0.94 0.94 1.11* 1.10*  CALCIUM 10.5* 10.5* 10.3 10.1 9.6  MG 2.1 2.2 2.5* 2.3 2.2   Liver Function Tests: Recent Labs  Lab 07/06/21 0700 07/07/21 0440 07/08/21 0458 07/09/21 0501  AST '26 21 22 20  '$ ALT '16 13 16 15  '$ ALKPHOS 82 76 76 74  BILITOT 0.2* 0.7 0.6 0.6  PROT 7.0 6.8 6.6 6.5  ALBUMIN 3.2* 3.2* 3.1* 3.1*    CBG: Recent Labs  Lab 07/05/21 2157  GLUCAP 190*    Discharge time spent: greater than 30 minutes.  Signed: Fritzi Mandes, MD [facility] 07/12/2021

## 2021-07-12 NOTE — TOC Transition Note (Signed)
Transition of Care [facility]) - CM/SW Discharge Note   Patient Details  Name: [name] MRN: [id number] Date of Birth: [birth year]  [facility] CM/SW Contact:  Laurena Slimmer, RN Phone Number: 07/12/2021, 1:12 PM   Clinical Narrative:    Damaris Schooner with Loraine Maple at [facility]. Sent signed FL2, and discharge summary. Nurse notified of room number and where to call report. EMS arranged. TOC signing off.          Patient Goals and CMS Choice        Discharge Placement                       Discharge Plan and Services                                     Social Determinants of Health (SDOH) Interventions     Readmission Risk Interventions    04/27/2021    1:16 PM  Readmission Risk Prevention Plan  Post Dischage Appt Complete  Medication Screening Complete  Transportation Screening Complete

## 2021-07-12 NOTE — Progress Notes (Signed)
Patient's nurse given the heart failure education packet that includes the Living Better with Heart Failure booklet, and information about the patient's upcoming appointment with the heart failure clinic. Pt's nurse states she will deliver to the patient and review the information. Will attempt to follow up tomorrow.  Georg Ruddle, RN

## 2021-07-12 NOTE — Progress Notes (Signed)
Pt. Sent back to her Care facility via EMS. V/S are stable and all questions answered. Education given with verbal acknowledgement.

## 2021-07-19 ENCOUNTER — Ambulatory Visit: Payer: Medicare HMO | Attending: Family | Admitting: Family

## 2021-07-19 ENCOUNTER — Encounter: Payer: Self-pay | Admitting: Family

## 2021-07-19 VITALS — BP 115/35 | HR 75 | Resp 18

## 2021-07-19 DIAGNOSIS — K219 Gastro-esophageal reflux disease without esophagitis: Secondary | ICD-10-CM | POA: Insufficient documentation

## 2021-07-19 DIAGNOSIS — I110 Hypertensive heart disease with heart failure: Secondary | ICD-10-CM | POA: Diagnosis not present

## 2021-07-19 DIAGNOSIS — I5032 Chronic diastolic (congestive) heart failure: Secondary | ICD-10-CM | POA: Diagnosis not present

## 2021-07-19 DIAGNOSIS — Z79899 Other long term (current) drug therapy: Secondary | ICD-10-CM | POA: Insufficient documentation

## 2021-07-19 DIAGNOSIS — J449 Chronic obstructive pulmonary disease, unspecified: Secondary | ICD-10-CM | POA: Diagnosis not present

## 2021-07-19 DIAGNOSIS — I739 Peripheral vascular disease, unspecified: Secondary | ICD-10-CM | POA: Diagnosis not present

## 2021-07-19 DIAGNOSIS — Z7901 Long term (current) use of anticoagulants: Secondary | ICD-10-CM | POA: Insufficient documentation

## 2021-07-19 DIAGNOSIS — I482 Chronic atrial fibrillation, unspecified: Secondary | ICD-10-CM

## 2021-07-19 DIAGNOSIS — G4730 Sleep apnea, unspecified: Secondary | ICD-10-CM | POA: Diagnosis not present

## 2021-07-19 DIAGNOSIS — R531 Weakness: Secondary | ICD-10-CM | POA: Insufficient documentation

## 2021-07-19 DIAGNOSIS — Z79890 Hormone replacement therapy (postmenopausal): Secondary | ICD-10-CM | POA: Insufficient documentation

## 2021-07-19 DIAGNOSIS — F0390 Unspecified dementia without behavioral disturbance: Secondary | ICD-10-CM | POA: Insufficient documentation

## 2021-07-19 DIAGNOSIS — I4891 Unspecified atrial fibrillation: Secondary | ICD-10-CM | POA: Diagnosis not present

## 2021-07-19 DIAGNOSIS — E039 Hypothyroidism, unspecified: Secondary | ICD-10-CM | POA: Insufficient documentation

## 2021-07-19 DIAGNOSIS — I10 Essential (primary) hypertension: Secondary | ICD-10-CM | POA: Diagnosis not present

## 2021-07-19 DIAGNOSIS — I890 Lymphedema, not elsewhere classified: Secondary | ICD-10-CM

## 2021-07-19 NOTE — Progress Notes (Signed)
Patient ID: [name], female    DOB: Jul 15, 1930, 88 y.o.   MRN: [id number]  HPI  Ms Zenker is a 88 y/o female with a history of atrial fibrillation, asthma, HTN, thyroid disease, dementia, GERD, gout, PVD, sleep apnea and chronic heart failure.   Echo report from 07/07/21 reviewed and showed an EF of 55-60% along with mild MR but no LVH  Admitted 07/04/21 due to dyspnea, productive cough, nausea and diarrhea. Started on steroids, bronchodilators, IV Lasix.  CTA chest was negative for pulmonary embolism. Initially given IV lasix but then transitioned to oral diuretics. PT evaluation done. Tested covid + and  completed molnupiravir. Discharged after 8 days. Was in the ED 06/30/21 due to AMS where she was evaluated and released.   She presents today for her initial visit with a chief complaint of minimal shortness of breath upon moderate exertion. She describes this as chronic but unable to quantify how long this has been going on for. She has associated fatigue, cough, wheezing, chest pain, chronic pedal edema, dizziness and weakness along with this. She denies any difficulty sleeping, abdominal distention or palpitations.   Not being weighed daily. Supposed to be wearing CPAP but she says that she doesn't "get along with it". Has worn compression socks in the past but finds that they are very difficult to get on and it wears her out trying to put them on.   Past Medical History:  Diagnosis Date   Arrhythmia    atrial fibrillation   Arthritis    Asthma    CHF (congestive heart failure) (HCC)    COPD (chronic obstructive pulmonary disease) (HCC)    Cough    Dementia (HCC)    GERD (gastroesophageal reflux disease)    Gout    Hiatal hernia    Hypertension    Hypothyroidism    MRSA (methicillin resistant staph aureus) culture positive    Neuropathic pain of right lower extremity    Peripheral vascular disease ([place])    "poor circulation"   Seasonal allergies    Shortness of breath  dyspnea    Sleep apnea    uses CPAP (sometimes)   Past Surgical History:  Procedure Laterality Date   ANKLE ARTHROSCOPY     BACK SURGERY     L4-L5 Decompression   CARDIAC CATHETERIZATION  1995   CATARACT EXTRACTION W/PHACO Right 07/19/2014   Procedure: CATARACT EXTRACTION PHACO AND INTRAOCULAR LENS PLACEMENT ([place]);  Surgeon: Leandrew Koyanagi, MD;  Location: [facility];  Service: Ophthalmology;  Laterality: Right;   CATARACT EXTRACTION W/PHACO Left 08/23/2014   Procedure: CATARACT EXTRACTION PHACO AND INTRAOCULAR LENS PLACEMENT ([facility]);  Surgeon: Leandrew Koyanagi, MD;  Location: [facility];  Service: Ophthalmology;  Laterality: Left;   CHOLECYSTECTOMY     COLONOSCOPY     PARATHYROIDECTOMY  2003   TONSILLECTOMY     UVULOPALATOPHARYNGOPLASTY     Family History  Problem Relation Age of Onset   Cancer Mother    Hyperlipidemia Son    Diabetes Son    Cancer Maternal Grandmother    Social History   Tobacco Use   Smoking status: Never    Passive exposure: Yes   Smokeless tobacco: Never  Substance Use Topics   Alcohol use: No   Allergies  Allergen Reactions   Prednisone Other (See Comments)    Irritability   Aspirin Nausea Only   Sulfa Antibiotics Rash   Sulfacetamide Sodium Rash   Prior to Admission medications   Medication Sig Start  Date End Date Taking? Authorizing Provider  acetaminophen (TYLENOL) 500 MG tablet Take 500-1,000 mg by mouth every 6 (six) hours as needed for mild pain or moderate pain.   Yes [provider]  apixaban (ELIQUIS) 5 MG TABS tablet Take 5 mg by mouth 2 (two) times daily.   Yes [provider]  benzonatate (TESSALON) 100 MG capsule Take 1 capsule by mouth 3 (three) times daily as needed. 06/19/21  Yes [provider]  DULoxetine (CYMBALTA) 60 MG capsule Take 60 mg by mouth daily. 05/04/20  Yes [provider]  famotidine (PEPCID) 20 MG tablet Take 1 tablet by mouth at bedtime. 05/20/21  Yes  [provider]  furosemide (LASIX) 20 MG tablet Take 20 mg by mouth 2 (two) times daily. 04/04/21  Yes [provider]  hydrOXYzine (ATARAX) 10 MG tablet Take 10 mg by mouth 2 (two) times daily. 04/01/21  Yes [provider]  ipratropium-albuterol (DUONEB) 0.5-2.5 (3) MG/3ML SOLN Take 3 mLs by nebulization every 6 (six) hours as needed (shortness of breath). 07/10/20  Yes Wieting, Richard, MD  levothyroxine (SYNTHROID) 150 MCG tablet Take 150 mcg by mouth daily. 04/01/21  Yes [provider]  loratadine (CLARITIN) 10 MG tablet Take 10 mg by mouth daily.   Yes [provider]  metoprolol succinate (TOPROL-XL) 25 MG 24 hr tablet Take 0.5 tablets (12.5 mg total) by mouth at bedtime. 07/10/20  Yes Wieting, Richard, MD  montelukast (SINGULAIR) 10 MG tablet Take 10 mg by mouth daily.   Yes [provider]  Multiple Vitamins-Minerals (PRESERVISION AREDS 2+MULTI VIT) CAPS Take 1 capsule by mouth as directed.   Yes [provider]  therapeutic multivitamin-minerals (THERAGRAN-M) tablet Take 1 tablet by mouth daily.   Yes [provider]  triamcinolone cream (KENALOG) 0.1 % Apply 1 application topically 2 (two) times daily as needed.   Yes [provider]  VENTOLIN HFA 108 (90 Base) MCG/ACT inhaler Inhale 2 puffs into the lungs every 6 (six) hours as needed for wheezing or shortness of breath.   Yes [provider]  vitamin B-12 (CYANOCOBALAMIN) 500 MCG tablet Take 500 mcg by mouth daily.   Yes [provider]  Vitamin D, Ergocalciferol, (DRISDOL) 1.25 MG (50000 UNIT) CAPS capsule Take 50,000 Units by mouth every 7 (seven) days. Saturday   Yes [provider]  Cholecalciferol 1.25 MG (50000 UT) TABS Take by mouth. Patient not taking: Reported on 07/04/2021    [provider]   Review of Systems  Constitutional:  Positive for fatigue. Negative for appetite change.  HENT:  Negative for congestion,  postnasal drip and sore throat.   Eyes: Negative.   Respiratory:  Positive for cough, shortness of breath and wheezing. Negative for chest tightness.   Cardiovascular:  Positive for chest pain ("feels like indigestion") and leg swelling. Negative for palpitations.  Gastrointestinal:  Negative for abdominal distention and abdominal pain.  Endocrine: Negative.   Genitourinary: Negative.   Musculoskeletal:  Negative for back pain and neck pain.  Skin: Negative.   Allergic/Immunologic: Negative.   Neurological:  Positive for dizziness and weakness. Negative for light-headedness.  Hematological:  Negative for adenopathy. Does not bruise/bleed easily.  Psychiatric/Behavioral:  Negative for dysphoric mood and sleep disturbance. The patient is not nervous/anxious.    Vitals:   07/19/21 1128  BP: (!) 115/35  Pulse: 75  Resp: 18  SpO2: 99%   Wt Readings from Last 3 Encounters:  07/12/21 257 lb 4.8 oz (116.7 kg)  06/30/21 285 lb 7.9 oz (129.5 kg)  04/26/21 251 lb (113.9 kg)   Lab Results  Component Value Date   CREATININE 1.10 (H) 07/12/2021   CREATININE 1.11 (H) 07/11/2021   CREATININE 0.94 07/10/2021    Physical Exam Vitals and nursing note reviewed. Exam conducted with a chaperone present (daughter).  Constitutional:      Appearance: Normal appearance.  HENT:     Head: Normocephalic and atraumatic.  Cardiovascular:     Rate and Rhythm: Normal rate. Rhythm irregular.  Pulmonary:     Effort: Pulmonary effort is normal.     Breath sounds: Rhonchi (upper lobes) present. No wheezing or rales.  Abdominal:     General: There is no distension.     Palpations: Abdomen is soft.  Musculoskeletal:        General: No tenderness.     Cervical back: Normal range of motion and neck supple.     Right lower leg: Edema (2+ pitting) present.     Left lower leg: Edema (2+ pitting) present.  Skin:    General: Skin is warm and dry.  Neurological:     General: No focal deficit present.      Mental Status: She is alert and oriented to person, place, and time.  Psychiatric:        Mood and Affect: Mood normal.        Behavior: Behavior normal.    Assessment & Plan:  1: Chronic heart failure with preserved ejection fraction without structural changes- - NYHA class II - euvolemic today - order written for her to be weighed daily and to call for an overnight weight gain of > 2 pounds or a weekly weight gain of > 5 pounds - due to weakness, she was unable to stand in clinic to get a weight - not adding any salt to her food although she says that sometimes her food does taste salty to her - drinking water and some tea during the day - BNP 07/04/21 was 164.2  2: HTN- - BP looks good (115/35) - saw PCP (Gauger) 05/24/21; now seeing [facility] at the facility - [facility] 07/12/21 reviewed and showed sodium 132, potassium 4.4, creatinine 1.1 & GFR 48  3: COPD- - saw pulmonology Raul Del) 06/19/21  4: Atrial fibrillation-  - saw cardiology Corky Sox) 06/24/21 - currently taking apixaban and metoprolol  5: Lymphedema- - stage 2 - does improve some with elevation of her legs - limited in her ability to exercise due to weakness - order written for facility to put compression socks on patient every morning with removal at bedtime - consider compression boots if edema persists   Facility medication list reviewed.   Return in 6 weeks, sooner if needed.

## 2021-07-19 NOTE — Patient Instructions (Addendum)
Begin weighing daily and call for an overnight weight gain of 3 pounds or more or a weekly weight gain of more than 5 pounds.  

## 2021-07-27 ENCOUNTER — Other Ambulatory Visit: Payer: Self-pay

## 2021-07-27 ENCOUNTER — Inpatient Hospital Stay
Admission: EM | Admit: 2021-07-27 | Discharge: 2021-07-31 | DRG: 603 | Disposition: A | Discharge status: Nursing Home (Includes ICF) | Payer: Medicare HMO | Source: Skilled Nursing Facility | Attending: Internal Medicine | Admitting: Internal Medicine

## 2021-07-27 ENCOUNTER — Emergency Department: Payer: Medicare HMO

## 2021-07-27 ENCOUNTER — Encounter: Payer: Self-pay | Admitting: Internal Medicine

## 2021-07-27 ENCOUNTER — Observation Stay: Payer: Medicare HMO

## 2021-07-27 DIAGNOSIS — L03119 Cellulitis of unspecified part of limb: Secondary | ICD-10-CM

## 2021-07-27 DIAGNOSIS — I10 Essential (primary) hypertension: Secondary | ICD-10-CM

## 2021-07-27 DIAGNOSIS — L03116 Cellulitis of left lower limb: Principal | ICD-10-CM | POA: Diagnosis present

## 2021-07-27 DIAGNOSIS — Z961 Presence of intraocular lens: Secondary | ICD-10-CM | POA: Diagnosis present

## 2021-07-27 DIAGNOSIS — Z9181 History of falling: Secondary | ICD-10-CM

## 2021-07-27 DIAGNOSIS — R531 Weakness: Secondary | ICD-10-CM

## 2021-07-27 DIAGNOSIS — E892 Postprocedural hypoparathyroidism: Secondary | ICD-10-CM | POA: Diagnosis present

## 2021-07-27 DIAGNOSIS — Z87440 Personal history of urinary (tract) infections: Secondary | ICD-10-CM

## 2021-07-27 DIAGNOSIS — Z7901 Long term (current) use of anticoagulants: Secondary | ICD-10-CM

## 2021-07-27 DIAGNOSIS — I130 Hypertensive heart and chronic kidney disease with heart failure and stage 1 through stage 4 chronic kidney disease, or unspecified chronic kidney disease: Secondary | ICD-10-CM | POA: Diagnosis present

## 2021-07-27 DIAGNOSIS — L03115 Cellulitis of right lower limb: Secondary | ICD-10-CM | POA: Diagnosis present

## 2021-07-27 DIAGNOSIS — I482 Chronic atrial fibrillation, unspecified: Secondary | ICD-10-CM

## 2021-07-27 DIAGNOSIS — M1990 Unspecified osteoarthritis, unspecified site: Secondary | ICD-10-CM | POA: Diagnosis present

## 2021-07-27 DIAGNOSIS — I878 Other specified disorders of veins: Secondary | ICD-10-CM | POA: Diagnosis present

## 2021-07-27 DIAGNOSIS — Z8614 Personal history of Methicillin resistant Staphylococcus aureus infection: Secondary | ICD-10-CM

## 2021-07-27 DIAGNOSIS — F0393 Unspecified dementia, unspecified severity, with mood disturbance: Secondary | ICD-10-CM | POA: Diagnosis present

## 2021-07-27 DIAGNOSIS — I739 Peripheral vascular disease, unspecified: Secondary | ICD-10-CM | POA: Diagnosis present

## 2021-07-27 DIAGNOSIS — M7989 Other specified soft tissue disorders: Secondary | ICD-10-CM | POA: Diagnosis not present

## 2021-07-27 DIAGNOSIS — I5032 Chronic diastolic (congestive) heart failure: Secondary | ICD-10-CM | POA: Diagnosis not present

## 2021-07-27 DIAGNOSIS — Z9841 Cataract extraction status, right eye: Secondary | ICD-10-CM

## 2021-07-27 DIAGNOSIS — Z79899 Other long term (current) drug therapy: Secondary | ICD-10-CM

## 2021-07-27 DIAGNOSIS — Z79890 Hormone replacement therapy (postmenopausal): Secondary | ICD-10-CM

## 2021-07-27 DIAGNOSIS — Z7722 Contact with and (suspected) exposure to environmental tobacco smoke (acute) (chronic): Secondary | ICD-10-CM | POA: Diagnosis present

## 2021-07-27 DIAGNOSIS — Z888 Allergy status to other drugs, medicaments and biological substances status: Secondary | ICD-10-CM

## 2021-07-27 DIAGNOSIS — F418 Other specified anxiety disorders: Secondary | ICD-10-CM

## 2021-07-27 DIAGNOSIS — J449 Chronic obstructive pulmonary disease, unspecified: Secondary | ICD-10-CM | POA: Diagnosis present

## 2021-07-27 DIAGNOSIS — Z886 Allergy status to analgesic agent status: Secondary | ICD-10-CM

## 2021-07-27 DIAGNOSIS — Z9842 Cataract extraction status, left eye: Secondary | ICD-10-CM

## 2021-07-27 DIAGNOSIS — W010XXA Fall on same level from slipping, tripping and stumbling without subsequent striking against object, initial encounter: Secondary | ICD-10-CM | POA: Diagnosis present

## 2021-07-27 DIAGNOSIS — Y92099 Unspecified place in other non-institutional residence as the place of occurrence of the external cause: Secondary | ICD-10-CM

## 2021-07-27 DIAGNOSIS — Z9049 Acquired absence of other specified parts of digestive tract: Secondary | ICD-10-CM

## 2021-07-27 DIAGNOSIS — I890 Lymphedema, not elsewhere classified: Secondary | ICD-10-CM | POA: Diagnosis present

## 2021-07-27 DIAGNOSIS — R600 Localized edema: Principal | ICD-10-CM

## 2021-07-27 DIAGNOSIS — J302 Other seasonal allergic rhinitis: Secondary | ICD-10-CM | POA: Diagnosis present

## 2021-07-27 DIAGNOSIS — K219 Gastro-esophageal reflux disease without esophagitis: Secondary | ICD-10-CM | POA: Diagnosis present

## 2021-07-27 DIAGNOSIS — M109 Gout, unspecified: Secondary | ICD-10-CM | POA: Diagnosis present

## 2021-07-27 DIAGNOSIS — E039 Hypothyroidism, unspecified: Secondary | ICD-10-CM

## 2021-07-27 DIAGNOSIS — W19XXXA Unspecified fall, initial encounter: Secondary | ICD-10-CM

## 2021-07-27 DIAGNOSIS — N1831 Chronic kidney disease, stage 3a: Secondary | ICD-10-CM | POA: Diagnosis not present

## 2021-07-27 DIAGNOSIS — Z882 Allergy status to sulfonamides status: Secondary | ICD-10-CM

## 2021-07-27 DIAGNOSIS — F0394 Unspecified dementia, unspecified severity, with anxiety: Secondary | ICD-10-CM | POA: Diagnosis present

## 2021-07-27 DIAGNOSIS — G4733 Obstructive sleep apnea (adult) (pediatric): Secondary | ICD-10-CM | POA: Diagnosis present

## 2021-07-27 DIAGNOSIS — Z6841 Body Mass Index (BMI) 40.0 and over, adult: Secondary | ICD-10-CM

## 2021-07-27 LAB — CBC WITH DIFFERENTIAL/PLATELET
Abs Immature Granulocytes: 0.02 10*3/uL (ref 0.00–0.07)
Basophils Absolute: 0 10*3/uL (ref 0.0–0.1)
Basophils Relative: 1 %
Eosinophils Absolute: 0.5 10*3/uL (ref 0.0–0.5)
Eosinophils Relative: 7 %
HCT: 38 % (ref 36.0–46.0)
Hemoglobin: 11.4 g/dL — ABNORMAL LOW (ref 12.0–15.0)
Immature Granulocytes: 0 %
Lymphocytes Relative: 28 %
Lymphs Abs: 2 10*3/uL (ref 0.7–4.0)
MCH: 25.1 pg — ABNORMAL LOW (ref 26.0–34.0)
MCHC: 30 g/dL (ref 30.0–36.0)
MCV: 83.7 fL (ref 80.0–100.0)
Monocytes Absolute: 0.7 10*3/uL (ref 0.1–1.0)
Monocytes Relative: 9 %
Neutro Abs: 4 10*3/uL (ref 1.7–7.7)
Neutrophils Relative %: 55 %
Platelets: 291 10*3/uL (ref 150–400)
RBC: 4.54 MIL/uL (ref 3.87–5.11)
RDW: 18.6 % — ABNORMAL HIGH (ref 11.5–15.5)
WBC: 7.2 10*3/uL (ref 4.0–10.5)
nRBC: 0 % (ref 0.0–0.2)

## 2021-07-27 LAB — COMPREHENSIVE METABOLIC PANEL
ALT: 13 U/L (ref 0–44)
AST: 20 U/L (ref 15–41)
Albumin: 3.2 g/dL — ABNORMAL LOW (ref 3.5–5.0)
Alkaline Phosphatase: 81 U/L (ref 38–126)
Anion gap: 5 (ref 5–15)
BUN: 27 mg/dL — ABNORMAL HIGH (ref 8–23)
CO2: 29 mmol/L (ref 22–32)
Calcium: 10.4 mg/dL — ABNORMAL HIGH (ref 8.9–10.3)
Chloride: 106 mmol/L (ref 98–111)
Creatinine, Ser: 1.1 mg/dL — ABNORMAL HIGH (ref 0.44–1.00)
GFR, Estimated: 48 mL/min — ABNORMAL LOW (ref >60)
Glucose, Bld: 130 mg/dL — ABNORMAL HIGH (ref 70–99)
Potassium: 4.2 mmol/L (ref 3.5–5.1)
Sodium: 140 mmol/L (ref 135–145)
Total Bilirubin: 0.6 mg/dL (ref 0.3–1.2)
Total Protein: 7.1 g/dL (ref 6.5–8.1)

## 2021-07-27 LAB — SEDIMENTATION RATE: Sed Rate: 58 mm/hr — ABNORMAL HIGH (ref 0–30)

## 2021-07-27 LAB — BRAIN NATRIURETIC PEPTIDE: B Natriuretic Peptide: 135.2 pg/mL — ABNORMAL HIGH (ref 0.0–100.0)

## 2021-07-27 MED ORDER — FUROSEMIDE 10 MG/ML IJ SOLN
40.0000 mg | Freq: Once | INTRAMUSCULAR | Status: AC
  Administered 2021-07-27: 40 mg via INTRAVENOUS
  Filled 2021-07-27: qty 4

## 2021-07-27 MED ORDER — VITAMIN B-12 1000 MCG PO TABS
500.0000 ug | ORAL_TABLET | Freq: Every day | ORAL | Status: DC
  Administered 2021-07-28 – 2021-07-31 (×4): 500 ug via ORAL
  Filled 2021-07-27 (×4): qty 1

## 2021-07-27 MED ORDER — SODIUM CHLORIDE 0.9 % IV SOLN
2.0000 g | Freq: Once | INTRAVENOUS | Status: AC
  Administered 2021-07-27: 2 g via INTRAVENOUS
  Filled 2021-07-27: qty 20

## 2021-07-27 MED ORDER — LEVOTHYROXINE SODIUM 50 MCG PO TABS
150.0000 ug | ORAL_TABLET | Freq: Every day | ORAL | Status: DC
  Administered 2021-07-28 – 2021-07-31 (×4): 150 ug via ORAL
  Filled 2021-07-27 (×4): qty 3

## 2021-07-27 MED ORDER — METOPROLOL SUCCINATE ER 25 MG PO TB24
12.5000 mg | ORAL_TABLET | Freq: Every day | ORAL | Status: DC
  Administered 2021-07-27 – 2021-07-30 (×4): 12.5 mg via ORAL
  Filled 2021-07-27 (×4): qty 1

## 2021-07-27 MED ORDER — HYDROXYZINE HCL 10 MG PO TABS
10.0000 mg | ORAL_TABLET | Freq: Two times a day (BID) | ORAL | Status: DC
  Administered 2021-07-27 – 2021-07-31 (×8): 10 mg via ORAL
  Filled 2021-07-27 (×8): qty 1

## 2021-07-27 MED ORDER — ACETAMINOPHEN 325 MG PO TABS
650.0000 mg | ORAL_TABLET | Freq: Four times a day (QID) | ORAL | Status: DC | PRN
  Administered 2021-07-27 – 2021-07-28 (×2): 650 mg via ORAL
  Filled 2021-07-27 (×2): qty 2

## 2021-07-27 MED ORDER — HYDRALAZINE HCL 20 MG/ML IJ SOLN: 5.0000 mg | INTRAMUSCULAR | Status: DC | PRN

## 2021-07-27 MED ORDER — APIXABAN 5 MG PO TABS
5.0000 mg | ORAL_TABLET | Freq: Two times a day (BID) | ORAL | Status: DC
  Administered 2021-07-27 – 2021-07-31 (×8): 5 mg via ORAL
  Filled 2021-07-27 (×8): qty 1

## 2021-07-27 MED ORDER — LORATADINE 10 MG PO TABS
10.0000 mg | ORAL_TABLET | Freq: Every day | ORAL | Status: DC
  Administered 2021-07-28 – 2021-07-31 (×4): 10 mg via ORAL
  Filled 2021-07-27 (×4): qty 1

## 2021-07-27 MED ORDER — ONDANSETRON HCL 4 MG/2ML IJ SOLN: 4.0000 mg | Freq: Three times a day (TID) | INTRAMUSCULAR | Status: DC | PRN

## 2021-07-27 MED ORDER — ADULT MULTIVITAMIN W/MINERALS CH
1.0000 | ORAL_TABLET | Freq: Every day | ORAL | Status: DC
  Administered 2021-07-28 – 2021-07-31 (×4): 1 via ORAL
  Filled 2021-07-27 (×4): qty 1

## 2021-07-27 MED ORDER — OXYCODONE-ACETAMINOPHEN 5-325 MG PO TABS
1.0000 | ORAL_TABLET | ORAL | Status: DC | PRN
  Administered 2021-07-29 – 2021-07-31 (×2): 1 via ORAL
  Filled 2021-07-27 (×2): qty 1

## 2021-07-27 MED ORDER — FAMOTIDINE 20 MG PO TABS
20.0000 mg | ORAL_TABLET | Freq: Every day | ORAL | Status: DC
  Administered 2021-07-27 – 2021-07-30 (×4): 20 mg via ORAL
  Filled 2021-07-27 (×4): qty 1

## 2021-07-27 MED ORDER — FUROSEMIDE 20 MG PO TABS
20.0000 mg | ORAL_TABLET | Freq: Two times a day (BID) | ORAL | Status: DC
  Administered 2021-07-28 – 2021-07-31 (×7): 20 mg via ORAL
  Filled 2021-07-27 (×7): qty 1

## 2021-07-27 MED ORDER — NYSTATIN 100000 UNIT/GM EX CREA
1.0000 "application " | TOPICAL_CREAM | Freq: Three times a day (TID) | CUTANEOUS | Status: DC
  Administered 2021-07-27 – 2021-07-31 (×11): 1 via TOPICAL
  Filled 2021-07-27: qty 30

## 2021-07-27 MED ORDER — VANCOMYCIN HCL 2000 MG/400ML IV SOLN
2000.0000 mg | Freq: Once | INTRAVENOUS | Status: AC
  Administered 2021-07-27: 2000 mg via INTRAVENOUS
  Filled 2021-07-27 (×2): qty 400

## 2021-07-27 MED ORDER — SODIUM CHLORIDE 0.9 % IV SOLN
2.0000 g | INTRAVENOUS | Status: DC
  Administered 2021-07-28 – 2021-07-29 (×2): 2 g via INTRAVENOUS
  Filled 2021-07-27 (×3): qty 20
  Filled 2021-07-27: qty 2

## 2021-07-27 MED ORDER — OCUVITE-LUTEIN PO CAPS
1.0000 | ORAL_CAPSULE | Freq: Every day | ORAL | Status: DC
  Administered 2021-07-28 – 2021-07-31 (×4): 1 via ORAL
  Filled 2021-07-27 (×4): qty 1

## 2021-07-27 MED ORDER — ALBUTEROL SULFATE (2.5 MG/3ML) 0.083% IN NEBU: 2.5000 mg | INHALATION_SOLUTION | RESPIRATORY_TRACT | Status: DC | PRN

## 2021-07-27 MED ORDER — DULOXETINE HCL 30 MG PO CPEP
60.0000 mg | ORAL_CAPSULE | Freq: Every day | ORAL | Status: DC
  Administered 2021-07-28 – 2021-07-31 (×4): 60 mg via ORAL
  Filled 2021-07-27 (×4): qty 2

## 2021-07-27 MED ORDER — DM-GUAIFENESIN ER 30-600 MG PO TB12
1.0000 | ORAL_TABLET | Freq: Two times a day (BID) | ORAL | Status: DC | PRN
  Filled 2021-07-27: qty 1

## 2021-07-27 MED ORDER — IPRATROPIUM BROMIDE 0.02 % IN SOLN
0.5000 mg | RESPIRATORY_TRACT | Status: DC | PRN
Start: 2021-07-27 — End: 2021-07-31

## 2021-07-27 MED ORDER — TRIAMCINOLONE ACETONIDE 0.1 % EX CREA
1.0000 "application " | TOPICAL_CREAM | Freq: Two times a day (BID) | CUTANEOUS | Status: DC
  Administered 2021-07-27 – 2021-07-31 (×7): 1 via TOPICAL
  Filled 2021-07-27 (×2): qty 15

## 2021-07-27 MED ORDER — MONTELUKAST SODIUM 10 MG PO TABS
10.0000 mg | ORAL_TABLET | Freq: Every day | ORAL | Status: DC
  Administered 2021-07-27 – 2021-07-30 (×4): 10 mg via ORAL
  Filled 2021-07-27 (×4): qty 1

## 2021-07-27 MED ORDER — VANCOMYCIN HCL IN DEXTROSE 1-5 GM/200ML-% IV SOLN: 1000.0000 mg | INTRAVENOUS | Status: DC

## 2021-07-27 NOTE — ED Triage Notes (Signed)
BIB EMS from [facility]. Pt tripped and fell and struck her head on the wall. Pt denies any LOC. Pt denies any pain. Pt doesn't know why she is here. Staff at facility made her come get assessed.

## 2021-07-27 NOTE — Assessment & Plan Note (Signed)
-  Eliquis and metoprolol

## 2021-07-27 NOTE — ED Provider Notes (Signed)
[facility] Provider Note    Event Date/Time   First MD Initiated Contact with Patient 07/27/21 1310     (approximate)   History   Fall   HPI  [name] is a 88 y.o. female with past medical history of heart failure with preserved EF, asthma, COPD, hypertension, GERD, here with fall, bilateral leg swelling, and increasing leg redness.  The patient states that over the last several days, she has had increasing bilateral lower extremity edema.  She has a history of similar symptoms, as well as recurrent cellulitis.  She was just started on Keflex at her assisted living facility.  She states that earlier today, she fell.  She is not sure whether her legs gave out or she just tripped.  She does have history of falls although it has been several months.  She currently states she feels otherwise well.  Denies any pain from the fall.  Per daughter's report, patient has had a's history of recurrent cellulitis with similar presentations.  No fevers.     Physical Exam   Triage Vital Signs: ED Triage Vitals  Enc Vitals Group     BP 07/27/21 1307 (!) 141/67     Pulse Rate 07/27/21 1307 73     Resp --      Temp 07/27/21 1307 97.7 F (36.5 C)     Temp Source 07/27/21 1307 Oral     SpO2 07/27/21 1307 99 %     Weight 07/27/21 1308 275 lb (124.7 kg)     Height 07/27/21 1308 '5\' 4"'  (1.626 m)     Head Circumference --      Peak Flow --      Pain Score 07/27/21 1308 0     Pain Loc --      Pain Edu? --      Excl. in [place]? --     Most recent vital signs: Vitals:   07/27/21 1600 07/27/21 1607  BP: (!) 172/80   Pulse: 67   Resp: (!) 24   Temp:  97.8 F (36.6 C)  SpO2: 99%      General: Awake, no distress.  CV:  Good peripheral perfusion.  Regular rate and rhythm. Resp:  Normal effort.  Lungs clear. Abd:  No distention.  No tenderness. Other:  2+ pitting edema bilateral lower extremities.  Moderate erythema bilateral lower shins, particularly along the left  with some streaking up the leg.  Mild tenderness to palpation.  Distal pulses intact.   ED Results / Procedures / Treatments   Labs (all labs ordered are listed, but only abnormal results are displayed) Labs Reviewed  CBC WITH DIFFERENTIAL/PLATELET - Abnormal; Notable for the following components:      Result Value   Hemoglobin 11.4 (*)    MCH 25.1 (*)    RDW 18.6 (*)    All other components within normal limits  COMPREHENSIVE METABOLIC PANEL - Abnormal; Notable for the following components:   Glucose, Bld 130 (*)    BUN 27 (*)    Creatinine, Ser 1.10 (*)    Calcium 10.4 (*)    Albumin 3.2 (*)    GFR, Estimated 48 (*)    All other components within normal limits  SEDIMENTATION RATE - Abnormal; Notable for the following components:   Sed Rate 58 (*)    All other components within normal limits  BRAIN NATRIURETIC PEPTIDE - Abnormal; Notable for the following components:   B Natriuretic Peptide 135.2 (*)  All other components within normal limits  CULTURE, BLOOD (ROUTINE X 2)  CULTURE, BLOOD (ROUTINE X 2)  C-REACTIVE PROTEIN     EKG Atrial fibrillation, ventricular rate 74.  QRS 94, QTc 41.  No acute ST elevations or depressions.   RADIOLOGY CT head/C-spine: No acute abnormality DG pelvis: Negative Chest x-ray: Negative   I also independently reviewed and agree with radiologist interpretations.   PROCEDURES:  Critical Care performed: No  MEDICATIONS ORDERED IN ED: Medications  vancomycin (VANCOREADY) IVPB 2000 mg/400 mL (has no administration in time range)  acetaminophen (TYLENOL) tablet 650 mg (has no administration in time range)  ondansetron (ZOFRAN) injection 4 mg (has no administration in time range)  hydrALAZINE (APRESOLINE) injection 5 mg (has no administration in time range)  albuterol (PROVENTIL) (2.5 MG/3ML) 0.083% nebulizer solution 2.5 mg (has no administration in time range)  dextromethorphan-guaiFENesin (MUCINEX DM) 30-600 MG per 12 hr tablet 1  tablet (has no administration in time range)  cefTRIAXone (ROCEPHIN) 2 g in sodium chloride 0.9 % 100 mL IVPB (has no administration in time range)  vancomycin (VANCOCIN) IVPB 1000 mg/200 mL premix (has no administration in time range)  cefTRIAXone (ROCEPHIN) 2 g in sodium chloride 0.9 % 100 mL IVPB (0 g Intravenous Stopped 07/27/21 1516)  furosemide (LASIX) injection 40 mg (40 mg Intravenous Given 07/27/21 1459)     IMPRESSION / MDM / ASSESSMENT AND PLAN / ED COURSE  I reviewed the triage vital signs and the nursing notes.                                Ddx:  Differential includes the following, with pertinent life- or limb-threatening emergencies considered:  Fall 2/2 generalized weakness from dehydration, cellulitis, UTI, polypharmacy, CHF  Patient's presentation is most consistent with acute complicated illness / injury requiring diagnostic workup.  MDM:  88 yo F with PMHx CHF, COPD, HTN, GERD here with fall and mild weakness. Re: fall - CT imaging, plain films reviewed independently by me as above, and are negative for acute traumatic injury. Suspect her fall was exacerbated, however, by acute cellulitis of L>R legs, in the setting of bl leg edema from CHF. H/o same. Labs reviewed, show no leukocytosis or anemia. CMP with baseline renal function. ESR elevated above baseline c/f acute infection. Given age, comorbidities, extent of cellulitis on legs with generalized weakness, will admit for IV abx, lasix.   MEDICATIONS GIVEN IN ED: Medications  vancomycin (VANCOREADY) IVPB 2000 mg/400 mL (has no administration in time range)  acetaminophen (TYLENOL) tablet 650 mg (has no administration in time range)  ondansetron (ZOFRAN) injection 4 mg (has no administration in time range)  hydrALAZINE (APRESOLINE) injection 5 mg (has no administration in time range)  albuterol (PROVENTIL) (2.5 MG/3ML) 0.083% nebulizer solution 2.5 mg (has no administration in time range)   dextromethorphan-guaiFENesin (MUCINEX DM) 30-600 MG per 12 hr tablet 1 tablet (has no administration in time range)  cefTRIAXone (ROCEPHIN) 2 g in sodium chloride 0.9 % 100 mL IVPB (has no administration in time range)  vancomycin (VANCOCIN) IVPB 1000 mg/200 mL premix (has no administration in time range)  cefTRIAXone (ROCEPHIN) 2 g in sodium chloride 0.9 % 100 mL IVPB (0 g Intravenous Stopped 07/27/21 1516)  furosemide (LASIX) injection 40 mg (40 mg Intravenous Given 07/27/21 1459)     Consults:  Hospitalist   EMR reviewed  Cardiology notes, including CHF Clinic with [facility], resp failure admission  07/04/21 for CHF     FINAL CLINICAL IMPRESSION(S) / ED DIAGNOSES   Final diagnoses:  Bilateral edema of lower extremity  Bilateral cellulitis of lower leg  Generalized weakness  Fall, initial encounter     Rx / DC Orders   ED Discharge Orders     None        Note:  This document was prepared using Dragon voice recognition software and may include unintentional dictation errors.   Duffy Bruce, MD 07/27/21 [id number]

## 2021-07-27 NOTE — Assessment & Plan Note (Signed)
Stable -Bronchodilators 

## 2021-07-27 NOTE — H&P (Signed)
History and Physical    [name] XBM:[id number] DOB: 1930-12-28 DOA: 07/27/2021  Referring MD/NP/PA:   PCP: [facility]   Patient coming from:  The patient is coming from ALF Chief Complaint: fall and leg pain  HPI: [name] is a 88 y.o. female with medical history significant of COPD not on oxygen, asthma, hypothyroidism, gout, depression, OSA on CPAP, PVD, MRSA, dCHF, atrial fibrillation on Eliquis, CKD-3A, UTI, hyperparathyroidism, hypercalcemia, lymphedema, who presents with fall and leg pain.  Patient states that she fell accidentally in the facility this morning, possibly injured her head, no loss of consciousness.  No unilateral numbness or tinglings in extremities.  No facial droop or slurred speech.  Patient has mild cough and mild shortness of breath, no chest pain.  No nausea vomiting, diarrhea or abdominal pain.  No symptoms of UTI.  She states that she has bilateral lower leg cellulitis, the left leg is worse than the right.  Patient is being treated with Keflex without improvement.  She has moderate pain in both legs, no fever or chills.  Her legs are swollen and erythematous.   Data Reviewed and ED Course: pt was found to have WBC 7.2, BNP 135, ESR 58, stable renal function, temperature normal, blood pressure 141/67, heart rate 73, oxygen saturation 99% on room air.  Chest x-ray showed cardiomegaly and vascular congestion.  CT of head and CT of C-spine negative for acute issues.  X-ray of the pelvis is negative for bony fracture.  Patient is placed on telemetry bed for observation.   EKG: I have personally reviewed.  A-fib, QTc 421, LAD, nonspecific T wave change.   Review of Systems:   General: no fevers, chills, no body weight gain, has fatigue HEENT: no blurry vision, hearing changes or sore throat Respiratory: has dyspnea, coughing, no wheezing CV: no chest pain, no palpitations GI: no nausea, vomiting, abdominal pain, diarrhea,  constipation GU: no dysuria, burning on urination, increased urinary frequency, hematuria  Ext: has leg edema and pain Neuro: no unilateral weakness, numbness, or tingling, no vision change or hearing loss. Has fall Skin: no rash, no skin tear. MSK: No muscle spasm, no deformity, no limitation of range of movement in spin Heme: No easy bruising.  Travel history: No recent long distant travel.   Allergy:  Allergies  Allergen Reactions   Prednisone Other (See Comments)    Irritability   Aspirin Nausea Only   Sulfa Antibiotics Rash   Sulfacetamide Sodium Rash    Past Medical History:  Diagnosis Date   Arrhythmia    atrial fibrillation   Arthritis    Asthma    CHF (congestive heart failure) (HCC)    COPD (chronic obstructive pulmonary disease) (HCC)    Cough    Dementia (HCC)    GERD (gastroesophageal reflux disease)    Gout    Hiatal hernia    Hypertension    Hypothyroidism    MRSA (methicillin resistant staph aureus) culture positive    Neuropathic pain of right lower extremity    Peripheral vascular disease ([place])    "poor circulation"   Seasonal allergies    Shortness of breath dyspnea    Sleep apnea    uses CPAP (sometimes)    Past Surgical History:  Procedure Laterality Date   ANKLE ARTHROSCOPY     BACK SURGERY     L4-L5 Decompression   CARDIAC CATHETERIZATION  1995   CATARACT EXTRACTION W/PHACO Right 07/19/2014   Procedure: CATARACT EXTRACTION PHACO AND  INTRAOCULAR LENS PLACEMENT (IOC);  Surgeon: Leandrew Koyanagi, MD;  Location: [facility];  Service: Ophthalmology;  Laterality: Right;   CATARACT EXTRACTION W/PHACO Left 08/23/2014   Procedure: CATARACT EXTRACTION PHACO AND INTRAOCULAR LENS PLACEMENT (IOC);  Surgeon: Leandrew Koyanagi, MD;  Location: [facility];  Service: Ophthalmology;  Laterality: Left;   CHOLECYSTECTOMY     COLONOSCOPY     PARATHYROIDECTOMY  2003   TONSILLECTOMY     UVULOPALATOPHARYNGOPLASTY      Social History:   reports that she has never smoked. She has been exposed to tobacco smoke. She has never used smokeless tobacco. She reports that she does not drink alcohol and does not use drugs.  Family History:  Family History  Problem Relation Age of Onset   Cancer Mother    Hyperlipidemia Son    Diabetes Son    Cancer Maternal Grandmother      Prior to Admission medications   Medication Sig Start Date End Date Taking? Authorizing Provider  acetaminophen (TYLENOL) 500 MG tablet Take 500-1,000 mg by mouth every 6 (six) hours as needed for mild pain or moderate pain.    [provider]  apixaban (ELIQUIS) 5 MG TABS tablet Take 5 mg by mouth 2 (two) times daily.    [provider]  benzonatate (TESSALON) 100 MG capsule Take 1 capsule by mouth 3 (three) times daily as needed. 06/19/21   [provider]  Cholecalciferol 1.25 MG (50000 UT) TABS Take by mouth. Patient not taking: Reported on 07/04/2021    [provider]  DULoxetine (CYMBALTA) 60 MG capsule Take 60 mg by mouth daily. 05/04/20   [provider]  famotidine (PEPCID) 20 MG tablet Take 1 tablet by mouth at bedtime. 05/20/21   [provider]  furosemide (LASIX) 20 MG tablet Take 20 mg by mouth 2 (two) times daily. 04/04/21   [provider]  hydrOXYzine (ATARAX) 10 MG tablet Take 10 mg by mouth 2 (two) times daily. 04/01/21   [provider]  ipratropium-albuterol (DUONEB) 0.5-2.5 (3) MG/3ML SOLN Take 3 mLs by nebulization every 6 (six) hours as needed (shortness of breath). 07/10/20   Loletha Grayer, MD  levothyroxine (SYNTHROID) 150 MCG tablet Take 150 mcg by mouth daily. 04/01/21   [provider]  loratadine (CLARITIN) 10 MG tablet Take 10 mg by mouth daily.    [provider]  metoprolol succinate (TOPROL-XL) 25 MG 24 hr tablet Take 0.5 tablets (12.5 mg total) by mouth at bedtime. 07/10/20   Loletha Grayer, MD  montelukast (SINGULAIR) 10 MG tablet Take 10 mg by  mouth daily.    [provider]  Multiple Vitamins-Minerals (PRESERVISION AREDS 2+MULTI VIT) CAPS Take 1 capsule by mouth as directed.    [provider]  therapeutic multivitamin-minerals (THERAGRAN-M) tablet Take 1 tablet by mouth daily.    [provider]  triamcinolone cream (KENALOG) 0.1 % Apply 1 application topically 2 (two) times daily as needed.    [provider]  VENTOLIN HFA 108 (90 Base) MCG/ACT inhaler Inhale 2 puffs into the lungs every 6 (six) hours as needed for wheezing or shortness of breath.    [provider]  vitamin B-12 (CYANOCOBALAMIN) 500 MCG tablet Take 500 mcg by mouth daily.    [provider]  Vitamin D, Ergocalciferol, (DRISDOL) 1.25 MG (50000 UNIT) CAPS capsule Take 50,000 Units by mouth every 7 (seven) days. Saturday    [provider]    Physical Exam: Vitals:   07/27/21 1530  07/27/21 1600 07/27/21 1607 07/27/21 1652  BP: (!) 172/118 (!) 172/80  138/74  Pulse: 65 67  84  Resp: (!) 25 (!) 24  15  Temp:   97.8 F (36.6 C) 97.7 F (36.5 C)  TempSrc:   Oral   SpO2: 99% 99%    Weight:      Height:       General: Not in acute distress HEENT:       Eyes: PERRL, EOMI, no scleral icterus.       ENT: No discharge from the ears and nose, no pharynx injection, no tonsillar enlargement.        Neck: No JVD, no bruit, no mass felt. Heme: No neck lymph node enlargement. Cardiac: S1/S2, RRR, No murmurs, No gallops or rubs. Respiratory: No rales, wheezing, rhonchi or rubs. GI: Soft, nondistended, nontender, no rebound pain, no organomegaly, BS present. GU: No hematuria Ext: Has swelling, erythema, warmth, tenderness in both legs, left leg is worse than the right.     Musculoskeletal: No joint deformities, No joint redness or warmth, no limitation of ROM in spin. Skin: No rashes.  Neuro: Alert, oriented X3, cranial nerves II-XII grossly intact, moves all extremities normally. Psych: Patient is not  psychotic, no suicidal or hemocidal ideation.  Labs on Admission: I have personally reviewed following labs and imaging studies  CBC: Recent Labs  Lab 07/27/21 1340  WBC 7.2  NEUTROABS 4.0  HGB 11.4*  HCT 38.0  MCV 83.7  PLT 891   Basic Metabolic Panel: Recent Labs  Lab 07/27/21 1340  NA 140  K 4.2  CL 106  CO2 29  GLUCOSE 130*  BUN 27*  CREATININE 1.10*  CALCIUM 10.4*   GFR: Estimated Creatinine Clearance: 46.2 mL/min (A) (by C-G formula based on SCr of 1.1 mg/dL (H)). Liver Function Tests: Recent Labs  Lab 07/27/21 1340  AST 20  ALT 13  ALKPHOS 81  BILITOT 0.6  PROT 7.1  ALBUMIN 3.2*   No results for input(s): LIPASE, AMYLASE in the last 168 hours. No results for input(s): AMMONIA in the last 168 hours. Coagulation Profile: No results for input(s): INR, PROTIME in the last 168 hours. Cardiac Enzymes: No results for input(s): CKTOTAL, CKMB, CKMBINDEX, TROPONINI in the last 168 hours. BNP (last 3 results) No results for input(s): PROBNP in the last 8760 hours. HbA1C: No results for input(s): HGBA1C in the last 72 hours. CBG: No results for input(s): GLUCAP in the last 168 hours. Lipid Profile: No results for input(s): CHOL, HDL, LDLCALC, TRIG, CHOLHDL, LDLDIRECT in the last 72 hours. Thyroid Function Tests: No results for input(s): TSH, T4TOTAL, FREET4, T3FREE, THYROIDAB in the last 72 hours. Anemia Panel: No results for input(s): VITAMINB12, FOLATE, FERRITIN, TIBC, IRON, RETICCTPCT in the last 72 hours. Urine analysis:    Component Value Date/Time   COLORURINE YELLOW (A) 07/07/2021 0805   APPEARANCEUR HAZY (A) 07/07/2021 0805   APPEARANCEUR Clear 01/19/2013 1248   LABSPEC 1.009 07/07/2021 0805   LABSPEC 1.004 01/19/2013 1248   PHURINE 8.0 07/07/2021 0805   GLUCOSEU NEGATIVE 07/07/2021 0805   GLUCOSEU Negative 01/19/2013 1248   HGBUR NEGATIVE 07/07/2021 0805   BILIRUBINUR NEGATIVE 07/07/2021 0805   BILIRUBINUR Negative 01/19/2013 1248    KETONESUR NEGATIVE 07/07/2021 0805   PROTEINUR NEGATIVE 07/07/2021 0805   NITRITE NEGATIVE 07/07/2021 0805   LEUKOCYTESUR NEGATIVE 07/07/2021 0805   LEUKOCYTESUR Negative 01/19/2013 1248   Sepsis Labs: _0 (procalcitonin:4,lacticidven:4) )No results found for this or any previous visit (from the past  240 hour(s)).   Radiological Exams on Admission: CT HEAD WO CONTRAST (5MM)  Result Date: 07/27/2021 CLINICAL DATA:  Tripped and fell at home. Blunt head and neck trauma. Confusion. EXAM: CT HEAD WITHOUT CONTRAST CT CERVICAL SPINE WITHOUT CONTRAST TECHNIQUE: Multidetector CT imaging of the head and cervical spine was performed following the standard protocol without intravenous contrast. Multiplanar CT image reconstructions of the cervical spine were also generated. RADIATION DOSE REDUCTION: This exam was performed according to the departmental dose-optimization program which includes automated exposure control, adjustment of the mA and/or kV according to patient size and/or use of iterative reconstruction technique. COMPARISON:  06/30/2021 and 04/26/2021 FINDINGS: CT HEAD FINDINGS Brain: No evidence of intracranial hemorrhage, acute infarction, hydrocephalus, extra-axial collection, or mass lesion/mass effect. Mild diffuse cerebral atrophy and chronic small vessel disease show no significant change. Vascular:  No hyperdense vessel or other acute findings. Skull: No evidence of fracture or other significant bone abnormality. Sinuses/Orbits:  No acute findings. Other: None. CT CERVICAL SPINE FINDINGS Alignment: Normal. Skull base and vertebrae: No acute fracture. No primary bone lesion or focal pathologic process. Soft tissues and spinal canal: No prevertebral fluid or swelling. No visible canal hematoma. Disc levels: Degenerative disc disease is seen which is most severe at C5-6 and C6-7. Bilateral facet DJD is also seen at most cervical levels. These findings are stable since prior exam. Upper chest:  No acute findings. Other: None. IMPRESSION: No acute intracranial abnormality. Stable cerebral atrophy and chronic small vessel disease. No evidence of acute cervical spine fracture or subluxation. Degenerative spondylosis, as described above. Electronically Signed   By: Marlaine Hind M.D.   On: 07/27/2021 14:20   CT Cervical Spine Wo Contrast  Result Date: 07/27/2021 CLINICAL DATA:  Tripped and fell at home. Blunt head and neck trauma. Confusion. EXAM: CT HEAD WITHOUT CONTRAST CT CERVICAL SPINE WITHOUT CONTRAST TECHNIQUE: Multidetector CT imaging of the head and cervical spine was performed following the standard protocol without intravenous contrast. Multiplanar CT image reconstructions of the cervical spine were also generated. RADIATION DOSE REDUCTION: This exam was performed according to the departmental dose-optimization program which includes automated exposure control, adjustment of the mA and/or kV according to patient size and/or use of iterative reconstruction technique. COMPARISON:  06/30/2021 and 04/26/2021 FINDINGS: CT HEAD FINDINGS Brain: No evidence of intracranial hemorrhage, acute infarction, hydrocephalus, extra-axial collection, or mass lesion/mass effect. Mild diffuse cerebral atrophy and chronic small vessel disease show no significant change. Vascular:  No hyperdense vessel or other acute findings. Skull: No evidence of fracture or other significant bone abnormality. Sinuses/Orbits:  No acute findings. Other: None. CT CERVICAL SPINE FINDINGS Alignment: Normal. Skull base and vertebrae: No acute fracture. No primary bone lesion or focal pathologic process. Soft tissues and spinal canal: No prevertebral fluid or swelling. No visible canal hematoma. Disc levels: Degenerative disc disease is seen which is most severe at C5-6 and C6-7. Bilateral facet DJD is also seen at most cervical levels. These findings are stable since prior exam. Upper chest: No acute findings. Other: None. IMPRESSION: No  acute intracranial abnormality. Stable cerebral atrophy and chronic small vessel disease. No evidence of acute cervical spine fracture or subluxation. Degenerative spondylosis, as described above. Electronically Signed   By: Marlaine Hind M.D.   On: 07/27/2021 14:20   DG Pelvis Portable  Result Date: 07/27/2021 CLINICAL DATA:  Pain after fall EXAM: PORTABLE PELVIS 1-2 VIEWS COMPARISON:  None Available. FINDINGS: There is no evidence of pelvic fracture or diastasis. No pelvic bone lesions  are seen. IMPRESSION: Negative. Electronically Signed   By: Dorise Bullion III M.D.   On: 07/27/2021 14:05   DG Chest Portable 1 View  Result Date: 07/27/2021 CLINICAL DATA:  Weakness.  Pain. EXAM: PORTABLE CHEST 1 VIEW COMPARISON:  Jul 04, 2021 FINDINGS: Stable cardiomegaly. The hila and mediastinum are unchanged. No pneumothorax. No nodules or masses. Blunting of left costophrenic angle. No overt edema or suspicious infiltrates. Mild interstitial prominence. IMPRESSION: 1. Cardiomegaly, a probable tiny left pleural effusion, and possible pulmonary venous congestion without overt edema. Electronically Signed   By: Dorise Bullion III M.D.   On: 07/27/2021 14:06      Assessment/Plan Principal Problem:   Cellulitis of lower extremity Active Problems:   Hypothyroidism   Hypertension   Atrial fibrillation, chronic (HCC)   Morbid obesity with BMI of 45.0-49.9, adult (HCC)   OSA (obstructive sleep apnea)   COPD (chronic obstructive pulmonary disease) (HCC)   Chronic diastolic CHF (congestive heart failure) ([place])   Depression with anxiety   Fall   Chronic kidney disease, stage 3a ([place])     Assessment and Plan: * Cellulitis of lower extremity Patient has cellulitis of lower extremity, left leg is worse than the right.  Patient failed outpatient Keflex treatment.  No fever or leukocytosis.  Not septic clinically. - vancomycin and Rocephin - PRN Zofran for nausea, tylenol and Percocet for pain - Blood  cultures x 2  - ESR and CRP - f/u LE venous doppler to r/o DVT   Hypothyroidism - Synthroid  Chronic kidney disease, stage 3a (HCC) Stable - Follow-up with BMP  Fall - Fall precaution -Need PT OT in facility  Depression with anxiety - Continue home medications  Chronic diastolic CHF (congestive heart failure) ([place]) 2D echo on 07/07/2021 showed EF 55 to 60%.  BNP is 135, no worsening shortness of breath, does not seem to have CHF exacerbation. -Patient received 40 mg of IV Lasix in ED -Continue home oral Lasix 20 mg twice daily  COPD (chronic obstructive pulmonary disease) (HCC) Stable -Bronchodilators  OSA (obstructive sleep apnea) -CPAP  Morbid obesity with BMI of 45.0-49.9, adult (HCC)  BMI= 47.2  and BW=14.7 Kg -Diet and exercise.   -Encouraged to lose weight.   Atrial fibrillation, chronic (HCC) -Eliquis and metoprolol  Hypertension - IV hydralazine as needed - metoprolol -Patient is on Lasix for CHF             DVT ppx: On Eliquis  Code Status: partial code per pt and her daughter (OK for CPR and no intubation)  Family Communication:    Yes, patient's daughter  by phone  Disposition Plan:  Anticipate discharge back to previous environment  Consults called:  none  Admission status and Level of care: Telemetry Medical:   for obs     Severity of Illness:  The appropriate patient status for this patient is OBSERVATION. Observation status is judged to be reasonable and necessary in order to provide the required intensity of service to ensure the patient's safety. The patient's presenting symptoms, physical exam findings, and initial radiographic and laboratory data in the context of their medical condition is felt to place them at decreased risk for further clinical deterioration. Furthermore, it is anticipated that the patient will be medically stable for discharge from the hospital within 2 midnights of admission.        Date of Service  07/27/2021    Ivor Costa [facility]   If 7PM-7AM, please contact night-coverage [email or web address] 07/27/2021, 5:43  PM

## 2021-07-27 NOTE — Assessment & Plan Note (Signed)
2D echo on 07/07/2021 showed EF 55 to 60%.  BNP is 135, no worsening shortness of breath, does not seem to have CHF exacerbation. -Patient received 40 mg of IV Lasix in ED -Continue home oral Lasix 20 mg twice daily

## 2021-07-27 NOTE — ED Notes (Signed)
Messaged pharmacy for [place]- has not arrived yet

## 2021-07-27 NOTE — Assessment & Plan Note (Addendum)
Patient has cellulitis of lower extremity, left leg is worse than the right.  Patient failed outpatient Keflex treatment.  No fever or leukocytosis.  Not septic clinically. Mixed component venous stasis changes, lymphedema and cellulitis Will continue antibiotics, await MRSA swab.  - vancomycin and Rocephin - PRN Zofran for nausea, tylenol and Percocet for pain - Blood cultures x 2  - ESR and CRP - f/u LE venous doppler to r/o DVT

## 2021-07-27 NOTE — Assessment & Plan Note (Addendum)
-   Fall precaution -PT/OT are recommending home health

## 2021-07-27 NOTE — ED Notes (Signed)
Pt cleaned up and put on pure wick

## 2021-07-27 NOTE — Assessment & Plan Note (Signed)
CPAP.  

## 2021-07-27 NOTE — Assessment & Plan Note (Signed)
-   IV hydralazine as needed - metoprolol -Patient is on Lasix for CHF

## 2021-07-27 NOTE — Assessment & Plan Note (Signed)
-  Eliquis -Metoprolol

## 2021-07-27 NOTE — Consult Note (Signed)
PHARMACY -  BRIEF ANTIBIOTIC NOTE   Pharmacy has received consult(s) for vancomycin from an ED provider. Patient is also ordered ceftriaxone. The patient's profile has been reviewed for ht/wt/allergies/indication/available labs.    One time order(s) placed for  --Vancomycin 2 g IV  Further antibiotics/pharmacy consults should be ordered by admitting physician if indicated.                       Thank you, Benita Gutter 07/27/2021  2:38 PM

## 2021-07-27 NOTE — Consult Note (Signed)
Pharmacy Antibiotic Note  [name] is a 88 y.o. female admitted on 07/27/2021 with cellulitis.  Pharmacy has been consulted for vancomycin dosing.  Plan: Vancomycin 2000 mg IV loading dose, followed by 1000 mg IV q24h Goal AUC 400-550  Est AUC: 539.3 Est Cmax:31.4 Est Cmin: 15.4 Calculated with SCr 1.1  Pt is also ordered ceftriaxone 2 g IV q24h   Monitor clinical picture, renal function, and vancomycin levels at steady state F/U C&S, abx deescalation / LOT   Height: '5\' 4"'$  (162.6 cm) Weight: 124.7 kg (275 lb) IBW/kg (Calculated) : 54.7  Temp (24hrs), Avg:97.7 F (36.5 C), Min:97.7 F (36.5 C), Max:97.7 F (36.5 C)  Recent Labs  Lab 07/27/21 1340  WBC 7.2  CREATININE 1.10*    Estimated Creatinine Clearance: 46.2 mL/min (A) (by C-G formula based on SCr of 1.1 mg/dL (H)).    Allergies  Allergen Reactions   Prednisone Other (See Comments)    Irritability   Aspirin Nausea Only   Sulfa Antibiotics Rash   Sulfacetamide Sodium Rash    Antimicrobials this admission: 6/3 ceftriaxone >>  6/3 vancomycin >>   Dose adjustments this admission:   Microbiology results: 6/3 BCx: pending   Thank you for allowing pharmacy to be a part of this patient's care.  Darnelle Bos, PharmD 07/27/2021 3:02 PM

## 2021-07-27 NOTE — Progress Notes (Signed)
1825 Pt being transported down to ultrasound at this time

## 2021-07-27 NOTE — Assessment & Plan Note (Signed)
  BMI= 47.2  and BW=14.7 Kg -Diet and exercise.   -Encouraged to lose weight.

## 2021-07-27 NOTE — Assessment & Plan Note (Addendum)
Repeat TSH within normal limit -Continue current dose of Synthroid

## 2021-07-27 NOTE — Assessment & Plan Note (Signed)
Stable -Follow-up with BMP 

## 2021-07-27 NOTE — Assessment & Plan Note (Signed)
-   Continue home medications 

## 2021-07-28 ENCOUNTER — Observation Stay: Payer: Medicare HMO

## 2021-07-28 LAB — CBC
HCT: 35.9 % — ABNORMAL LOW (ref 36.0–46.0)
Hemoglobin: 11.1 g/dL — ABNORMAL LOW (ref 12.0–15.0)
MCH: 25.1 pg — ABNORMAL LOW (ref 26.0–34.0)
MCHC: 30.9 g/dL (ref 30.0–36.0)
MCV: 81.2 fL (ref 80.0–100.0)
Platelets: 279 10*3/uL (ref 150–400)
RBC: 4.42 MIL/uL (ref 3.87–5.11)
RDW: 18.2 % — ABNORMAL HIGH (ref 11.5–15.5)
WBC: 7.7 10*3/uL (ref 4.0–10.5)
nRBC: 0 % (ref 0.0–0.2)

## 2021-07-28 LAB — CREATININE, SERUM
Creatinine, Ser: 0.84 mg/dL (ref 0.44–1.00)
GFR, Estimated: >60 mL/min (ref >60)

## 2021-07-28 LAB — MRSA NEXT GEN BY PCR, NASAL: MRSA by PCR Next Gen: NOT DETECTED

## 2021-07-28 LAB — C-REACTIVE PROTEIN: CRP: 2 mg/dL — ABNORMAL HIGH (ref <1.0)

## 2021-07-28 MED ORDER — VANCOMYCIN HCL 1250 MG/250ML IV SOLN
1250.0000 mg | INTRAVENOUS | Status: DC
Start: 2021-07-28 — End: 2021-07-30
  Administered 2021-07-28 – 2021-07-29 (×2): 1250 mg via INTRAVENOUS
  Filled 2021-07-28 (×2): qty 250

## 2021-07-28 MED ORDER — CHLORHEXIDINE GLUCONATE 0.12 % MT SOLN
15.0000 mL | Freq: Two times a day (BID) | OROMUCOSAL | Status: DC
  Administered 2021-07-28 – 2021-07-31 (×6): 15 mL via OROMUCOSAL
  Filled 2021-07-28 (×7): qty 15

## 2021-07-28 MED ORDER — ORAL CARE MOUTH RINSE
15.0000 mL | Freq: Two times a day (BID) | OROMUCOSAL | Status: DC
  Administered 2021-07-28 – 2021-07-31 (×7): 15 mL via OROMUCOSAL

## 2021-07-28 NOTE — Progress Notes (Signed)
  Progress Note   Patient: [name] DCV:[id number] DOB: Feb 04, 1931 DOA: 07/27/2021     0 DOS: the patient was seen and examined on 07/28/2021   Brief hospital course: No notes on file  Assessment and Plan: * Cellulitis of lower extremity Patient has cellulitis of lower extremity, left leg is worse than the right.  Patient failed outpatient Keflex treatment.  No fever or leukocytosis.  Not septic clinically. Mixed component venous stasis changes, lymphedema and cellulitis Will continue antibiotics, await MRSA swab.  - vancomycin and Rocephin - PRN Zofran for nausea, tylenol and Percocet for pain - Blood cultures x 2  - ESR and CRP - f/u LE venous doppler to r/o DVT   Hypothyroidism - Synthroid  Chronic kidney disease, stage 3a (HCC) Stable - Follow-up with BMP  Fall - Fall precaution -Need PT OT in facility  Depression with anxiety - Continue home medications  Chronic diastolic CHF (congestive heart failure) ([place]) 2D echo on 07/07/2021 showed EF 55 to 60%.  BNP is 135, no worsening shortness of breath, does not seem to have CHF exacerbation. -Patient received 40 mg of IV Lasix in ED -Continue home oral Lasix 20 mg twice daily  COPD (chronic obstructive pulmonary disease) (HCC) Stable -Bronchodilators  OSA (obstructive sleep apnea) -CPAP  Morbid obesity with BMI of 45.0-49.9, adult (HCC)  BMI= 47.2  and BW=14.7 Kg -Diet and exercise.   -Encouraged to lose weight.   Atrial fibrillation, chronic (HCC) -Eliquis and metoprolol  Hypertension - IV hydralazine as needed - metoprolol -Patient is on Lasix for CHF        Subjective: Tells me she has chronic lymphedema and has utilized unna boots in the past. Tells perhaps fall was exacerbated by leg swelling and pain. Denies SOB/CP, F/C. No N/V.  Physical Exam: Vitals:   07/27/21 2027 07/28/21 0510 07/28/21 0736 07/28/21 1115  BP: (!) 144/60 (!) 156/74 129/68 (!) 111/53  Pulse: 69 88 65 65  Resp: $Remo'18 17  16 18  'FMYAa$ Temp: 97.6 F (36.4 C) 97.8 F (36.6 C) 98.1 F (36.7 C) (!) 97.5 F (36.4 C)  TempSrc:      SpO2: 100% 99% 96% 99%  Weight:      Height:        Data Reviewed:  There are no new results to review at this time.  Family Communication: none available  Disposition: Status is: Observation The patient remains OBS appropriate and will d/c before 2 midnights.  Planned Discharge Destination: Home    Time spent: 25 minutes  Author: Vanna Scotland, MD 07/28/2021 1:37 PM  For on call review [email or web address].

## 2021-07-28 NOTE — Consult Note (Signed)
Pharmacy Antibiotic Note  Tanishia Lemaster is a 88 y.o. female admitted on 07/27/2021 with cellulitis.  Pharmacy has been consulted for vancomycin dosing.  Plan: Adjusting Vancomycin to 1250 mg IV q24h Goal AUC 400-550  Est AUC: 533.5 Est Cmax:31.4 Est Cmin: 14.5 Calculated with Scr 0.84  Pt is also ordered ceftriaxone 2 g IV q24h   Monitor clinical picture, renal function, and vancomycin levels at steady state F/U C&S, abx deescalation / LOT   Height: '5\' 4"'$  (162.6 cm) Weight: 124.7 kg (275 lb) IBW/kg (Calculated) : 54.7  Temp (24hrs), Avg:97.8 F (36.6 C), Min:97.6 F (36.4 C), Max:98.1 F (36.7 C)  Recent Labs  Lab 07/27/21 1340 07/28/21 0448  WBC 7.2 7.7  CREATININE 1.10* 0.84     Estimated Creatinine Clearance: 60.4 mL/min (by C-G formula based on SCr of 0.84 mg/dL).    Allergies  Allergen Reactions   Prednisone Other (See Comments)    Irritability   Aspirin Nausea Only   Sulfa Antibiotics Rash   Sulfacetamide Sodium Rash    Antimicrobials this admission: 6/3 ceftriaxone >>  6/3 vancomycin >>   Dose adjustments this admission: Vancomycin 1g Q24 changed to 1.25g d/t improved renal function  Microbiology results: 6/3 BCx: NGTD   Thank you for allowing pharmacy to be a part of this patient's care.  Pearla Dubonnet, PharmD 07/28/2021 9:37 AM

## 2021-07-29 DIAGNOSIS — K219 Gastro-esophageal reflux disease without esophagitis: Secondary | ICD-10-CM | POA: Diagnosis present

## 2021-07-29 DIAGNOSIS — Z6841 Body Mass Index (BMI) 40.0 and over, adult: Secondary | ICD-10-CM | POA: Diagnosis not present

## 2021-07-29 DIAGNOSIS — Z8614 Personal history of Methicillin resistant Staphylococcus aureus infection: Secondary | ICD-10-CM | POA: Diagnosis not present

## 2021-07-29 DIAGNOSIS — I130 Hypertensive heart and chronic kidney disease with heart failure and stage 1 through stage 4 chronic kidney disease, or unspecified chronic kidney disease: Secondary | ICD-10-CM | POA: Diagnosis present

## 2021-07-29 DIAGNOSIS — Z9181 History of falling: Secondary | ICD-10-CM | POA: Diagnosis not present

## 2021-07-29 DIAGNOSIS — N1831 Chronic kidney disease, stage 3a: Secondary | ICD-10-CM | POA: Diagnosis present

## 2021-07-29 DIAGNOSIS — E892 Postprocedural hypoparathyroidism: Secondary | ICD-10-CM | POA: Diagnosis present

## 2021-07-29 DIAGNOSIS — I5032 Chronic diastolic (congestive) heart failure: Secondary | ICD-10-CM | POA: Diagnosis present

## 2021-07-29 DIAGNOSIS — J449 Chronic obstructive pulmonary disease, unspecified: Secondary | ICD-10-CM | POA: Diagnosis present

## 2021-07-29 DIAGNOSIS — W010XXA Fall on same level from slipping, tripping and stumbling without subsequent striking against object, initial encounter: Secondary | ICD-10-CM | POA: Diagnosis present

## 2021-07-29 DIAGNOSIS — L03116 Cellulitis of left lower limb: Secondary | ICD-10-CM | POA: Diagnosis present

## 2021-07-29 DIAGNOSIS — E039 Hypothyroidism, unspecified: Secondary | ICD-10-CM | POA: Diagnosis present

## 2021-07-29 DIAGNOSIS — I878 Other specified disorders of veins: Secondary | ICD-10-CM | POA: Diagnosis present

## 2021-07-29 DIAGNOSIS — Z7722 Contact with and (suspected) exposure to environmental tobacco smoke (acute) (chronic): Secondary | ICD-10-CM | POA: Diagnosis present

## 2021-07-29 DIAGNOSIS — F0394 Unspecified dementia, unspecified severity, with anxiety: Secondary | ICD-10-CM | POA: Diagnosis present

## 2021-07-29 DIAGNOSIS — Z9049 Acquired absence of other specified parts of digestive tract: Secondary | ICD-10-CM | POA: Diagnosis not present

## 2021-07-29 DIAGNOSIS — I739 Peripheral vascular disease, unspecified: Secondary | ICD-10-CM | POA: Diagnosis present

## 2021-07-29 DIAGNOSIS — I482 Chronic atrial fibrillation, unspecified: Secondary | ICD-10-CM | POA: Diagnosis present

## 2021-07-29 DIAGNOSIS — L03115 Cellulitis of right lower limb: Secondary | ICD-10-CM | POA: Diagnosis present

## 2021-07-29 DIAGNOSIS — M109 Gout, unspecified: Secondary | ICD-10-CM | POA: Diagnosis present

## 2021-07-29 DIAGNOSIS — F0393 Unspecified dementia, unspecified severity, with mood disturbance: Secondary | ICD-10-CM | POA: Diagnosis present

## 2021-07-29 DIAGNOSIS — G4733 Obstructive sleep apnea (adult) (pediatric): Secondary | ICD-10-CM | POA: Diagnosis present

## 2021-07-29 DIAGNOSIS — Z87440 Personal history of urinary (tract) infections: Secondary | ICD-10-CM | POA: Diagnosis not present

## 2021-07-29 DIAGNOSIS — L03119 Cellulitis of unspecified part of limb: Secondary | ICD-10-CM | POA: Diagnosis not present

## 2021-07-29 DIAGNOSIS — M7989 Other specified soft tissue disorders: Secondary | ICD-10-CM | POA: Diagnosis present

## 2021-07-29 DIAGNOSIS — I890 Lymphedema, not elsewhere classified: Secondary | ICD-10-CM | POA: Diagnosis present

## 2021-07-29 DIAGNOSIS — Y92099 Unspecified place in other non-institutional residence as the place of occurrence of the external cause: Secondary | ICD-10-CM | POA: Diagnosis not present

## 2021-07-29 LAB — BASIC METABOLIC PANEL
Anion gap: 6 (ref 5–15)
BUN: 28 mg/dL — ABNORMAL HIGH (ref 8–23)
CO2: 28 mmol/L (ref 22–32)
Calcium: 10.4 mg/dL — ABNORMAL HIGH (ref 8.9–10.3)
Chloride: 107 mmol/L (ref 98–111)
Creatinine, Ser: 1.09 mg/dL — ABNORMAL HIGH (ref 0.44–1.00)
GFR, Estimated: 49 mL/min — ABNORMAL LOW (ref >60)
Glucose, Bld: 113 mg/dL — ABNORMAL HIGH (ref 70–99)
Potassium: 4.1 mmol/L (ref 3.5–5.1)
Sodium: 141 mmol/L (ref 135–145)

## 2021-07-29 LAB — TSH: TSH: 1.339 u[IU]/mL (ref 0.350–4.500)

## 2021-07-29 NOTE — Evaluation (Signed)
Physical Therapy Evaluation Patient Details Name: [name] MRN: [id number] DOB: 01/21/1931 Today's Date: 07/29/2021  History of Present Illness  Pt is an 88 y.o. female presenting to hospital 6/3 s/p fall.  Imaging showing  "Lateral subluxation of the fifth toe at the proximal interphalangeal joint, of uncertain acuity. There is an a erosion or subchondral cyst involving the proximal phalanx at the interphalangeal joint."  Pt admitted with cellulitis of LE (L>R).   PMH includes HF, asthma, COPD, htn, B LE swelling, OSA on CPAP, a-fib, CKD, lymphedema.  Clinical Impression  Prior to hospital admission, pt reports being ambulatory short distances with rollator; per chart lives at ALF.  Pt A&O x4 but some generalized confusion noted during session.  Currently pt is modified independent semi-supine to sitting edge of bed; min assist progressing to CGA with transfers using RW; and CGA to ambulate a few feet bed to recliner with RW use (limited distance ambulating d/t SOB; O2 sats WFL on room air during sessions activities).  Pt would benefit from skilled PT to address noted impairments and functional limitations (see below for any additional details).  Upon hospital discharge, pt would benefit from HHPT at ALF.    Recommendations for follow up therapy are one component of a multi-disciplinary discharge planning process, led by the attending physician.  Recommendations may be updated based on patient status, additional functional criteria and insurance authorization.  Follow Up Recommendations Home health PT (at ALF)    Assistance Recommended at Discharge Frequent or constant Supervision/Assistance  Patient can return home with the following  A little help with walking and/or transfers;A little help with bathing/dressing/bathroom;Assistance with cooking/housework;Assist for transportation    Equipment Recommendations None recommended by PT  Recommendations for Other Services       Functional  Status Assessment Patient has had a recent decline in their functional status and demonstrates the ability to make significant improvements in function in a reasonable and predictable amount of time.     Precautions / Restrictions Precautions Precautions: Fall Restrictions Weight Bearing Restrictions: No      Mobility  Bed Mobility Overal bed mobility: Modified Independent Bed Mobility: Supine to Sit     Supine to sit: Modified independent (Device/Increase time), HOB elevated     General bed mobility comments: increased effort to perform on own; use of bed rail    Transfers Overall transfer level: Needs assistance Equipment used: Rolling walker (2 wheels) Transfers: Sit to/from Stand Sit to Stand: Min assist, Min guard           General transfer comment: min assist to stand from bed 1st trial but CGA to stand from bed 2nd trial; increased effort/time to stand but steady    Ambulation/Gait Ambulation/Gait assistance: Min guard Gait Distance (Feet): 3 Feet (bed to recliner) Assistive device: Rolling walker (2 wheels)   Gait velocity: decreased     General Gait Details: decreased B LE step length/foot clearance  Stairs            Wheelchair Mobility    Modified Rankin (Stroke Patients Only)       Balance Overall balance assessment: Needs assistance Sitting-balance support: No upper extremity supported, Feet supported Sitting balance-Leahy Scale: Good Sitting balance - Comments: steady sitting reaching outside BOS   Standing balance support: Bilateral upper extremity supported, During functional activity Standing balance-Leahy Scale: Good Standing balance comment: steady ambulating short distance bed to recliner with RW use  Pertinent Vitals/Pain Pain Assessment Pain Assessment: No/denies pain HR WFL during sessions activities.    Home Living Family/patient expects to be discharged to:: Assisted living                  Home Equipment: Rollator (4 wheels)      Prior Function Prior Level of Function : Needs assist             Mobility Comments: Pt reports being ambulatory limited/short distances with rollator ADLs Comments: Pt reports meals are brought to her room; has assist for ADL's and IADL's     Hand Dominance        Extremity/Trunk Assessment   Upper Extremity Assessment Upper Extremity Assessment: Generalized weakness    Lower Extremity Assessment Lower Extremity Assessment: Generalized weakness    Cervical / Trunk Assessment Cervical / Trunk Assessment: Other exceptions Cervical / Trunk Exceptions: forward head/shoulders  Communication   Communication: HOH  Cognition Arousal/Alertness: Awake/alert Behavior During Therapy: WFL for tasks assessed/performed Overall Cognitive Status: Within Functional Limits for tasks assessed                                 General Comments: A&O x4; occasional mild confusion noted        General Comments  Nursing cleared pt for participation in physical therapy.  Pt agreeable to PT session.    Exercises  Transfer/mobility training   Assessment/Plan    PT Assessment Patient needs continued PT services  PT Problem List Decreased strength;Decreased activity tolerance;Decreased balance;Decreased mobility;Decreased knowledge of use of DME       PT Treatment Interventions DME instruction;Gait training;Functional mobility training;Therapeutic activities;Therapeutic exercise;Balance training;Patient/family education    PT Goals (Current goals can be found in the Care Plan section)  Acute Rehab PT Goals Patient Stated Goal: to improve mobility PT Goal Formulation: With patient Time For Goal Achievement: 08/12/21 Potential to Achieve Goals: Good    Frequency Min 2X/week     Co-evaluation               AM-PAC PT "6 Clicks" Mobility  Outcome Measure Help needed turning from your back to your side  while in a flat bed without using bedrails?: None Help needed moving from lying on your back to sitting on the side of a flat bed without using bedrails?: A Little Help needed moving to and from a bed to a chair (including a wheelchair)?: A Little Help needed standing up from a chair using your arms (e.g., wheelchair or bedside chair)?: A Little Help needed to walk in hospital room?: A Little Help needed climbing 3-5 steps with a railing? : A Lot 6 Click Score: 18    End of Session Equipment Utilized During Treatment: Gait belt Activity Tolerance: Patient tolerated treatment well Patient left: in chair;with call bell/phone within reach;with chair alarm set Nurse Communication: Mobility status;Precautions PT Visit Diagnosis: Muscle weakness (generalized) (M62.81);Unsteadiness on feet (R26.81);Other abnormalities of gait and mobility (R26.89);History of falling (Z91.81)    Time: 1660-6301 PT Time Calculation (min) (ACUTE ONLY): 26 min   Charges:   PT Evaluation $PT Eval Low Complexity: 1 Low PT Treatments $Therapeutic Activity: 8-22 mins       Leitha Bleak, PT 07/29/21, 3:41 PM

## 2021-07-29 NOTE — Progress Notes (Signed)
Progress Note   Patient: [name] MGQ:[id number] DOB: 03/05/1930 DOA: 07/27/2021     0 DOS: the patient was seen and examined on 07/29/2021   Brief hospital course: Taken from prior notes.  [name] is a 88 y.o. female with medical history significant of COPD not on oxygen, asthma, hypothyroidism, gout, depression, OSA on CPAP, PVD, MRSA, dCHF, atrial fibrillation on Eliquis, CKD-3A, UTI, hyperparathyroidism, hypercalcemia, lymphedema, who presents with fall and leg pain.  Patient lives at [facility]   Patient states that she fell accidentally in the facility this morning, possibly injured her head, no loss of consciousness.  No unilateral numbness or tinglings in extremities.  No facial droop or slurred speech.  Patient has mild cough and mild shortness of breath, no chest pain.  No nausea vomiting, diarrhea or abdominal pain.  No symptoms of UTI.  She states that she has bilateral lower leg cellulitis, the left leg is worse than the right.  Patient is being treated with Keflex without improvement.  She has moderate pain in both legs, no fever or chills.  Her legs are swollen and erythematous.   Data Reviewed and ED Course: pt was found to have WBC 7.2, BNP 135, ESR 58, stable renal function, temperature normal, blood pressure 141/67, heart rate 73, oxygen saturation 99% on room air.  Chest x-ray showed cardiomegaly and vascular congestion.  CT of head and CT of C-spine negative for acute issues.  X-ray of the pelvis is negative for bony fracture.   EKG: I have personally reviewed.  A-fib, QTc 421, LAD, nonspecific T wave change.  6/5: ESR elevated at 58, CRP at 2, blood cultures remain negative.  MRSA PCR negative. Remained afebrile.  PT is recommending home health services. TSH was rechecked as it was low in March and her dose of Synthroid was decreased.  Current TSH within normal limit. Can be discharged home tomorrow if remains stable.   Assessment and Plan: * Cellulitis of lower  extremity Patient has cellulitis of lower extremity, left leg is worse than the right.  Patient failed outpatient Keflex treatment.  No fever or leukocytosis.  Not septic clinically. Mixed component venous stasis changes, lymphedema and cellulitis Will continue antibiotics, MRSA swab negative.  - vancomycin and Rocephin - PRN Zofran for nausea, tylenol and Percocet for pain - Blood cultures x 2 -remain negative - ESR and CRP-mildly elevated with CRP at 2 and ESR at 56 -   Hypothyroidism Repeat TSH within normal limit -Continue current dose of Synthroid  Chronic kidney disease, stage 3a (HCC) Stable - Follow-up with BMP  Fall - Fall precaution -PT/OT are recommending home health  Depression with anxiety - Continue home medications  Chronic diastolic CHF (congestive heart failure) ([place]) 2D echo on 07/07/2021 showed EF 55 to 60%.  BNP is 135, no worsening shortness of breath, does not seem to have CHF exacerbation. -Patient received 40 mg of IV Lasix in ED -Continue home oral Lasix 20 mg twice daily  COPD (chronic obstructive pulmonary disease) (HCC) Stable -Bronchodilators  OSA (obstructive sleep apnea) -CPAP  Morbid obesity with BMI of 45.0-49.9, adult (HCC)  BMI= 47.2  and BW=14.7 Kg -Diet and exercise.   -Encouraged to lose weight.   Atrial fibrillation, chronic (HCC) -Eliquis and metoprolol  Hypertension - IV hydralazine as needed - metoprolol -Patient is on Lasix for CHF   Subjective: Patient was seen and examined today.  No new complaints.  Physical Exam: Vitals:   07/29/21 0500 07/29/21 0730 07/29/21  1126 07/29/21 1534  BP:  (!) 145/56 (!) 135/58 (!) 107/59  Pulse:  65 (!) 56 64  Resp:  '17 20 20  ' Temp:  97.8 F (36.6 C) 98.4 F (36.9 C) 98.2 F (36.8 C)  TempSrc:  Oral Oral   SpO2:  96% 96% 98%  Weight: 117.5 kg     Height:       General.  Obese elderly lady in no acute distress. Pulmonary.  Lungs clear bilaterally, normal respiratory  effort. CV.  Regular rate and rhythm, no JVD, rub or murmur. Abdomen.  Soft, nontender, nondistended, BS positive. CNS.  Alert and oriented .  No focal neurologic deficit. Extremities.  Bilateral lower leg erythema with trace edema, pulses intact Psychiatry.  Judgment and insight appears normal.  Data Reviewed: Prior notes, labs and images reviewed  Family Communication:   Disposition: Status is: Inpatient Remains inpatient appropriate because: Severity of illness   Planned Discharge Destination: Home with Home Health  DVT prophylaxis.  Eliquis Time spent: 45 minutes  This record has been created using Systems analyst. Errors have been sought and corrected,but may not always be located. Such creation errors do not reflect on the standard of care.  Author: Lorella Nimrod, MD 07/29/2021 5:26 PM  For on call review [email or web address].

## 2021-07-29 NOTE — Plan of Care (Signed)

## 2021-07-29 NOTE — Plan of Care (Signed)
  Problem: Education: Goal: Knowledge of General Education information will improve Description: Including pain rating scale, medication(s)/side effects and non-pharmacologic comfort measures Outcome: Progressing   Problem: Health Behavior/Discharge Planning: Goal: Ability to manage health-related needs will improve Outcome: Progressing   Problem: Clinical Measurements: Goal: Ability to maintain clinical measurements within normal limits will improve Outcome: Progressing   Problem: Clinical Measurements: Goal: Will remain free from infection Outcome: Progressing   Problem: Clinical Measurements: Goal: Diagnostic test results will improve Outcome: Progressing   Problem: Clinical Measurements: Goal: Respiratory complications will improve Outcome: Progressing   Problem: Clinical Measurements: Goal: Cardiovascular complication will be avoided Outcome: Progressing   Problem: Activity: Goal: Risk for activity intolerance will decrease Outcome: Progressing   Problem: Nutrition: Goal: Adequate nutrition will be maintained Outcome: Progressing   Problem: Coping: Goal: Level of anxiety will decrease Outcome: Progressing   Problem: Elimination: Goal: Will not experience complications related to bowel motility Outcome: Progressing   Problem: Elimination: Goal: Will not experience complications related to urinary retention Outcome: Progressing   Problem: Pain Managment: Goal: General experience of comfort will improve Outcome: Progressing   Problem: Safety: Goal: Ability to remain free from injury will improve Outcome: Progressing   Problem: Skin Integrity: Goal: Risk for impaired skin integrity will decrease Outcome: Progressing   

## 2021-07-29 NOTE — Hospital Course (Addendum)
Taken from prior notes.  [name] is a 88 y.o. female with medical history significant of COPD not on oxygen, asthma, hypothyroidism, gout, depression, OSA on CPAP, PVD, MRSA, dCHF, atrial fibrillation on Eliquis, CKD-3A, UTI, hyperparathyroidism, hypercalcemia, lymphedema, who presents with fall and leg pain.  Patient lives at [place]   Patient states that she fell accidentally in the facility this morning, possibly injured her head, no loss of consciousness.  No unilateral numbness or tinglings in extremities.  No facial droop or slurred speech.  Patient has mild cough and mild shortness of breath, no chest pain.  No nausea vomiting, diarrhea or abdominal pain.  No symptoms of UTI.  She states that she has bilateral lower leg cellulitis, the left leg is worse than the right.  Patient is being treated with Keflex without improvement.  She has moderate pain in both legs, no fever or chills.  Her legs are swollen and erythematous.   Data Reviewed and ED Course: pt was found to have WBC 7.2, BNP 135, ESR 58, stable renal function, temperature normal, blood pressure 141/67, heart rate 73, oxygen saturation 99% on room air.  Chest x-ray showed cardiomegaly and vascular congestion.  CT of head and CT of C-spine negative for acute issues.  X-ray of the pelvis is negative for bony fracture.   EKG: I have personally reviewed.  A-fib, QTc 421, LAD, nonspecific T wave change.  6/5: ESR elevated at 58, CRP at 2, blood cultures remain negative.  MRSA PCR negative. Remained afebrile.  PT is recommending home health services. TSH was rechecked as it was low in March and her dose of Synthroid was decreased.  Current TSH within normal limit. Can be discharged home tomorrow if remains stable.

## 2021-07-30 DIAGNOSIS — L03116 Cellulitis of left lower limb: Principal | ICD-10-CM

## 2021-07-30 DIAGNOSIS — L03115 Cellulitis of right lower limb: Secondary | ICD-10-CM

## 2021-07-30 LAB — BASIC METABOLIC PANEL
Anion gap: 6 (ref 5–15)
BUN: 28 mg/dL — ABNORMAL HIGH (ref 8–23)
CO2: 29 mmol/L (ref 22–32)
Calcium: 10.5 mg/dL — ABNORMAL HIGH (ref 8.9–10.3)
Chloride: 104 mmol/L (ref 98–111)
Creatinine, Ser: 0.84 mg/dL (ref 0.44–1.00)
GFR, Estimated: >60 mL/min (ref >60)
Glucose, Bld: 126 mg/dL — ABNORMAL HIGH (ref 70–99)
Potassium: 4 mmol/L (ref 3.5–5.1)
Sodium: 139 mmol/L (ref 135–145)

## 2021-07-30 MED ORDER — DOXYCYCLINE HYCLATE 100 MG PO TABS: 100.0000 mg | ORAL_TABLET | Freq: Two times a day (BID) | ORAL | 0 refills | Status: AC

## 2021-07-30 MED ORDER — DOXYCYCLINE HYCLATE 100 MG PO TABS
100.0000 mg | ORAL_TABLET | Freq: Two times a day (BID) | ORAL | Status: DC
  Administered 2021-07-30 – 2021-07-31 (×3): 100 mg via ORAL
  Filled 2021-07-30 (×3): qty 1

## 2021-07-30 NOTE — TOC Progression Note (Signed)
Transition of Care [facility]) - Progression Note    Patient Details  Name: Lya Holben MRN: [id number] Date of Birth: [birth year]  [facility] CM/SW [place], RN Phone Number: 07/30/2021, 12:15 PM  Clinical Narrative:    Reached out to Earlyne Iba admission nurse, she is not longer with [facility] she is now retired, She stated she will reach out to the Statistician and provide them with my number and request that they call me back   Expected Discharge Plan: Assisted Living Barriers to Discharge: Continued Medical Work up  Expected Discharge Plan and Services Expected Discharge Plan: Assisted Living   Discharge Planning Services: CM Consult   Living arrangements for the past 2 months: [place] Expected Discharge Date: 07/30/21               DME Arranged: 3-N-1 DME Agency: [employer] Date DME Agency Contacted: 07/30/21 Time DME Agency Contacted: 29 Representative spoke with at DME Agency: Suanne Marker HH Arranged: OT, PT [place] Agency: [facility] Date [place]: 07/30/21 Time [place]: 1108 Representative spoke with at [place]: [place] ([place]) Interventions    Readmission Risk Interventions    04/27/2021    1:16 PM  Readmission Risk Prevention Plan  Post Dischage Appt Complete  Medication Screening Complete  Transportation Screening Complete

## 2021-07-30 NOTE — Progress Notes (Signed)
Physical Therapy Treatment Patient Details Name: Claudina Oliphant MRN: [id number] DOB: 1930-03-30 Today's Date: 07/30/2021   History of Present Illness Pt is an 88 y.o. female presenting to hospital 6/3 s/p fall.  Imaging showing  "Lateral subluxation of the fifth toe at the proximal interphalangeal joint, of uncertain acuity. There is an a erosion or subchondral cyst involving the proximal phalanx at the interphalangeal joint."  Pt admitted with cellulitis of LE (L>R).   PMH includes HF, asthma, COPD, htn, B LE swelling, OSA on CPAP, a-fib, CKD, lymphedema.    PT Comments    Pt received seated in recliner upon arrival to room and pt agreeable to therapy.  Pt performed well with STS x4 and was able to assist with donning a brief for ambulation.  Pt then able to ambulate a few feet away from the nursing station and then back to the room.  Pt transferred back to bed per request and performed bed-level exercises in order to assist with blood flow of the LE's.  Pt notes that her back was relieved after being placed in the bed, however her R LE was hurting more and was more red upon getting back in bed.  Nursing notified via securechat.  Current discharge plans to home health at ALF remain appropriate at this time.  Pt will continue to benefit from skilled therapy in order to address deficits listed below.     Recommendations for follow up therapy are one component of a multi-disciplinary discharge planning process, led by the attending physician.  Recommendations may be updated based on patient status, additional functional criteria and insurance authorization.  Follow Up Recommendations  Home health PT (at ALF)     Assistance Recommended at Discharge Frequent or constant Supervision/Assistance  Patient can return home with the following A little help with walking and/or transfers;A little help with bathing/dressing/bathroom;Assistance with cooking/housework;Assist for transportation   Equipment  Recommendations  None recommended by PT    Recommendations for Other Services       Precautions / Restrictions Precautions Precautions: Fall Restrictions Weight Bearing Restrictions: No     Mobility  Bed Mobility               General bed mobility comments: pt upright in recliner upon arrival.    Transfers Overall transfer level: Needs assistance Equipment used: Rolling walker (2 wheels) Transfers: Sit to/from Stand Sit to Stand: Min assist, Min guard           General transfer comment: min assist to stand from bed 1st trial but CGA to stand from bed 2nd trial; increased effort/time to stand but steady    Ambulation/Gait Ambulation/Gait assistance: Min guard Gait Distance (Feet): 15 Feet Assistive device: Rolling walker (2 wheels) Gait Pattern/deviations: Decreased step length - left, Decreased step length - right, Decreased stride length, Trunk flexed Gait velocity: decreased     General Gait Details: decreased B LE step length/foot clearance   Stairs             Wheelchair Mobility    Modified Rankin (Stroke Patients Only)       Balance Overall balance assessment: Needs assistance Sitting-balance support: No upper extremity supported, Feet supported Sitting balance-Leahy Scale: Good Sitting balance - Comments: steady sitting reaching outside BOS   Standing balance support: Bilateral upper extremity supported, During functional activity Standing balance-Leahy Scale: Good Standing balance comment: steady ambulating short distance bed to recliner with RW use  Cognition Arousal/Alertness: Awake/alert Behavior During Therapy: WFL for tasks assessed/performed Overall Cognitive Status: Within Functional Limits for tasks assessed                                 General Comments: A&O x4; occasional mild confusion noted        Exercises      General Comments        Pertinent  Vitals/Pain Pain Assessment Pain Assessment: Faces Faces Pain Scale: Hurts a little bit Pain Location: R Leg Pain Descriptors / Indicators: Aching Pain Intervention(s): Limited activity within patient's tolerance, Monitored during session, Premedicated before session, Repositioned    Home Living                          Prior Function            PT Goals (current goals can now be found in the care plan section) Acute Rehab PT Goals Patient Stated Goal: to improve mobility PT Goal Formulation: With patient Time For Goal Achievement: 08/12/21 Potential to Achieve Goals: Good Progress towards PT goals: Progressing toward goals    Frequency    Min 2X/week      PT Plan Current plan remains appropriate    Co-evaluation              AM-PAC PT "6 Clicks" Mobility   Outcome Measure  Help needed turning from your back to your side while in a flat bed without using bedrails?: None Help needed moving from lying on your back to sitting on the side of a flat bed without using bedrails?: A Little Help needed moving to and from a bed to a chair (including a wheelchair)?: A Little Help needed standing up from a chair using your arms (e.g., wheelchair or bedside chair)?: A Little Help needed to walk in hospital room?: A Little Help needed climbing 3-5 steps with a railing? : A Lot 6 Click Score: 18    End of Session Equipment Utilized During Treatment: Gait belt Activity Tolerance: Patient tolerated treatment well Patient left: in chair;with call bell/phone within reach;with chair alarm set Nurse Communication: Mobility status;Precautions PT Visit Diagnosis: Muscle weakness (generalized) (M62.81);Unsteadiness on feet (R26.81);Other abnormalities of gait and mobility (R26.89);History of falling (Z91.81)     Time: 5784-6962 PT Time Calculation (min) (ACUTE ONLY): 26 min  Charges:  $Therapeutic Activity: 8-22 mins                     Gwenlyn Saran, PT,  DPT 07/30/21, 4:42 PM    Christie Nottingham 07/30/2021, 4:36 PM

## 2021-07-30 NOTE — TOC Progression Note (Signed)
Transition of Care [facility]) - Progression Note    Patient Details  Name: Adalay Azucena MRN: [id number] Date of Birth: 1930/05/06  Transition of Care [facility]) CM/SW [place], RN Phone Number: 07/30/2021, 2:06 PM  Clinical Narrative:     Bary Leriche called from [facility] and stated that she will come in the morning to do an assessment on the patient to see if they can take her back, I explained that she has a discharge summary and a Discharge order for today, she said she cant come until tomorrow  Expected Discharge Plan: Assisted Living Barriers to Discharge: Continued Medical Work up  Expected Discharge Plan and Services Expected Discharge Plan: Assisted Living   Discharge Planning Services: CM Consult   Living arrangements for the past 2 months: [place] Expected Discharge Date: 07/30/21               DME Arranged: 3-N-1 DME Agency: [employer] Date DME Agency Contacted: 07/30/21 Time DME Agency Contacted: 68 Representative spoke with at DME Agency: Suanne Marker HH Arranged: OT, PT [place] Agency: [facility] Date [place]: 07/30/21 Time [place]: [id number] Representative spoke with at [place]: [place]) Interventions    Readmission Risk Interventions    04/27/2021    1:16 PM  Readmission Risk Prevention Plan  Post Dischage Appt Complete  Medication Screening Complete  Transportation Screening Complete

## 2021-07-30 NOTE — Progress Notes (Signed)
PT REFUSED NIV/CPAP for HS. NARD/SOB at this time.

## 2021-07-30 NOTE — TOC Progression Note (Signed)
Transition of Care [facility]) - Progression Note    Patient Details  Name: [name] MRN: [id number] Date of Birth: Mar 26, 1930  Transition of Care [facility]) CM/SW [place], RN Phone Number: 07/30/2021, 11:06 AM  Clinical Narrative:     Merleen Nicely with [facility] confirmed that they service [employer] patients at [facility], I set the patient uyp with [facility] services thru [facility]  Expected Discharge Plan: Assisted Living Barriers to Discharge: Continued Medical Work up  Expected Discharge Plan and Services Expected Discharge Plan: Assisted Living   Discharge Planning Services: CM Consult   Living arrangements for the past 2 months: [place]                 DME Arranged: 3-N-1 DME Agency: [facility] Date DME Agency Contacted: 07/30/21 Time DME Agency Contacted: 93 Representative spoke with at DME Agency: rhonda HH Arranged: PT           Social Determinants of Health ([place]) Interventions    Readmission Risk Interventions    04/27/2021    1:16 PM  Readmission Risk Prevention Plan  Post Dischage Appt Complete  Medication Screening Complete  Transportation Screening Complete

## 2021-07-30 NOTE — Progress Notes (Signed)
Patient is not able to walk the distance required to go the bathroom, or he/she is unable to safely negotiate stairs required to access the bathroom.  A 3in1 BSC will alleviate this problem  

## 2021-07-30 NOTE — Progress Notes (Signed)
Met with the patient in the room and spoke with her daughter on the phone, the patient lives at [facility], she has a Corporate investment banker at the ALF, She needs a 3 in 1 [employer] will deliver the 3 in 1 She will need [place] services I contacted [place] [facility] to arrange [facility] services, left a VM for the supervisor requesting a call back Her daughter stated that last time the patient was in the hospital she was transported back by EMS but the ALF has a Lucianne Lei and they should be able to transport her back I am awaiting a call back from [place] [facility] to arrange [facility]

## 2021-07-30 NOTE — Discharge Summary (Signed)
Physician Discharge Summary   Patient: [name] MRN: [id number] DOB: 09-Jul-1930  Admit date:     07/27/2021  Discharge date: 07/30/21  Discharge Physician: Lorella Nimrod   PCP: Orvis Brill, [facility]   Recommendations at discharge:  Follow-up with primary care provider within a week Please ensure the completion of antibiotic course Please obtain TSH in 4 weeks  Discharge Diagnoses: Principal Problem:   Bilateral cellulitis of lower leg Active Problems:   Hypothyroidism   Hypertension   Atrial fibrillation, chronic (HCC)   Bilateral edema of lower extremity   Morbid obesity with BMI of 45.0-49.9, adult (HCC)   OSA (obstructive sleep apnea)   COPD (chronic obstructive pulmonary disease) (HCC)   Chronic diastolic CHF (congestive heart failure) ([place])   Depression with anxiety   Fall   Chronic kidney disease, stage 3a ([place])   Cellulitis of left lower extremity   Hospital Course: Taken from prior notes.  [name] is a 88 y.o. female with medical history significant of COPD not on oxygen, asthma, hypothyroidism, gout, depression, OSA on CPAP, PVD, MRSA, dCHF, atrial fibrillation on Eliquis, CKD-3A, UTI, hyperparathyroidism, hypercalcemia, lymphedema, who presents with fall and leg pain.  Patient lives at [facility]   Patient states that she fell accidentally in the facility this morning, possibly injured her head, no loss of consciousness.  No unilateral numbness or tinglings in extremities.  No facial droop or slurred speech.  Patient has mild cough and mild shortness of breath, no chest pain.  No nausea vomiting, diarrhea or abdominal pain.  No symptoms of UTI.  She states that she has bilateral lower leg cellulitis, the left leg is worse than the right.  Patient is being treated with Keflex without improvement.  She has moderate pain in both legs, no fever or chills.  Her legs are swollen and erythematous.   Data Reviewed and ED Course: pt was found to have WBC 7.2, BNP 135,  ESR 58, stable renal function, temperature normal, blood pressure 141/67, heart rate 73, oxygen saturation 99% on room air.  Chest x-ray showed cardiomegaly and vascular congestion.  CT of head and CT of C-spine negative for acute issues.  X-ray of the pelvis is negative for bony fracture.   EKG: I have personally reviewed.  A-fib, QTc 421, LAD, nonspecific T wave change.  6/5: ESR elevated at 58, CRP at 2, blood cultures remain negative.  MRSA PCR negative. Remained afebrile.  PT is recommending home health services. TSH was rechecked as it was low in March and her dose of Synthroid was decreased.  Current TSH within normal limit.  6/6: Patient remained stable.  Renal function back to baseline of below 1.  Pain well controlled and patient would like to go back to her facility where she is being discharged with home health services.  Patient lives in assisted living.  She is being discharged on 1 more week of doxycycline and will need a close follow-up by PCP for further recommendations.  She will continue current medications and follow-up with her providers.  Assessment and Plan: * Bilateral cellulitis of lower leg Patient has cellulitis of lower extremity, left leg is worse than the right.  Patient failed outpatient Keflex treatment.  No fever or leukocytosis.  Not septic clinically. Mixed component venous stasis changes, lymphedema and cellulitis Will continue antibiotics, MRSA swab negative.  - vancomycin and Rocephin - PRN Zofran for nausea, tylenol and Percocet for pain - Blood cultures x 2 -remain negative - ESR and  CRP-mildly elevated with CRP at 2 and ESR at 56 -   Hypothyroidism Repeat TSH within normal limit -Continue current dose of Synthroid  Chronic kidney disease, stage 3a (HCC) Stable - Follow-up with BMP  Fall - Fall precaution -PT/OT are recommending home health  Depression with anxiety - Continue home medications  Chronic diastolic CHF (congestive heart  failure) ([place]) 2D echo on 07/07/2021 showed EF 55 to 60%.  BNP is 135, no worsening shortness of breath, does not seem to have CHF exacerbation. -Patient received 40 mg of IV Lasix in ED -Continue home oral Lasix 20 mg twice daily  COPD (chronic obstructive pulmonary disease) (HCC) Stable -Bronchodilators  OSA (obstructive sleep apnea) -CPAP  Morbid obesity with BMI of 45.0-49.9, adult (HCC)  BMI= 47.2  and BW=14.7 Kg -Diet and exercise.   -Encouraged to lose weight.   Atrial fibrillation, chronic (HCC) -Eliquis and metoprolol  Hypertension - IV hydralazine as needed - metoprolol -Patient is on Lasix for CHF   Consultants: None Procedures performed: None Disposition: Assisted living Diet recommendation:  Discharge Diet Orders (From admission, onward)     Start     Ordered   07/30/21 0000  Diet - low sodium heart healthy        07/30/21 1125           Cardiac diet DISCHARGE MEDICATION: Allergies as of 07/30/2021       Reactions   Prednisone Other (See Comments)   Irritability   Aspirin Nausea Only   Sulfa Antibiotics Rash   Sulfacetamide Sodium Rash        Medication List     STOP taking these medications    cephALEXin 500 MG capsule Commonly known as: KEFLEX       TAKE these medications    acetaminophen 500 MG tablet Commonly known as: TYLENOL Take 500-1,000 mg by mouth every 6 (six) hours as needed for mild pain or moderate pain.   albuterol 108 (90 Base) MCG/ACT inhaler Commonly known as: VENTOLIN HFA Inhale 2 puffs into the lungs every 6 (six) hours as needed for wheezing or shortness of breath.   apixaban 5 MG Tabs tablet Commonly known as: ELIQUIS Take 5 mg by mouth 2 (two) times daily.   benzonatate 100 MG capsule Commonly known as: TESSALON Take 100 mg by mouth 3 (three) times daily as needed for cough.   doxycycline 100 MG tablet Commonly known as: VIBRA-TABS Take 1 tablet (100 mg total) by mouth 2 (two) times daily for 7  days.   DULoxetine 60 MG capsule Commonly known as: CYMBALTA Take 60 mg by mouth daily.   famotidine 20 MG tablet Commonly known as: PEPCID Take 20 mg by mouth at bedtime.   furosemide 20 MG tablet Commonly known as: LASIX Take 20 mg by mouth 2 (two) times daily.   guaiFENesin-dextromethorphan 100-10 MG/5ML syrup Commonly known as: ROBITUSSIN DM Take 5 mLs by mouth every 6 (six) hours as needed for cough.   hydrOXYzine 10 MG tablet Commonly known as: ATARAX Take 10 mg by mouth 2 (two) times daily.   ipratropium 0.02 % nebulizer solution Commonly known as: ATROVENT Take 0.5 mg by nebulization every 4 (four) hours as needed for wheezing or shortness of breath.   levothyroxine 150 MCG tablet Commonly known as: SYNTHROID Take 150 mcg by mouth daily.   loratadine 10 MG tablet Commonly known as: CLARITIN Take 10 mg by mouth daily.   metoprolol succinate 25 MG 24 hr tablet Commonly known as: TOPROL-XL  Take 0.5 tablets (12.5 mg total) by mouth at bedtime.   montelukast 10 MG tablet Commonly known as: SINGULAIR Take 10 mg by mouth at bedtime.   nystatin cream Commonly known as: MYCOSTATIN Apply 1 application. topically 3 (three) times daily. (Apply under the breasts)   ondansetron 8 MG disintegrating tablet Commonly known as: ZOFRAN-ODT Take 8 mg by mouth every 8 (eight) hours as needed for nausea or vomiting.   PreserVision AREDS 2+Multi Vit Caps Take 1 capsule by mouth daily.   therapeutic multivitamin-minerals tablet Take 1 tablet by mouth daily.   triamcinolone cream 0.1 % Commonly known as: KENALOG Apply 1 application. topically 2 (two) times daily.   vitamin B-12 500 MCG tablet Commonly known as: CYANOCOBALAMIN Take 500 mcg by mouth daily.   Vitamin D (Ergocalciferol) 1.25 MG (50000 UNIT) Caps capsule Commonly known as: DRISDOL Take 50,000 Units by mouth every Friday.        Follow-up Information     [facility], [facility]. Schedule an  appointment as soon as possible for a visit in 1 week(s).   Specialty: Geriatric Medicine Contact information: [address] [place] [id number] [phone]                Discharge Exam: Filed Weights   07/27/21 1308 07/29/21 0500  Weight: 124.7 kg 117.5 kg   General.  Obese elderly lady, in no acute distress. Pulmonary.  Lungs clear bilaterally, normal respiratory effort. CV.  Regular rate and rhythm, no JVD, rub or murmur. Abdomen.  Soft, nontender, nondistended, BS positive. CNS.  Alert and oriented .  No focal neurologic deficit. Extremities.  No edema, no cyanosis, pulses intact and symmetrical. Psychiatry.  Judgment and insight appears normal.   Condition at discharge: stable  The results of significant diagnostics from this hospitalization (including imaging, microbiology, ancillary and laboratory) are listed below for reference.   Imaging Studies: DG Chest 2 View  Result Date: 07/04/2021 CLINICAL DATA:  Short of breath EXAM: CHEST - 2 VIEW COMPARISON:  06/30/2021 FINDINGS: Prominent right heart border similar to the prior study. Mild vascular congestion. No edema. Mild left lower lobe atelectasis similar. No effusion. IMPRESSION: Mild vascular congestion without edema Mild left lower lobe atelectasis. Electronically Signed   By: Franchot Gallo M.D.   On: 07/04/2021 16:31   CT HEAD WO CONTRAST (5MM)  Result Date: 07/27/2021 CLINICAL DATA:  Tripped and fell at home. Blunt head and neck trauma. Confusion. EXAM: CT HEAD WITHOUT CONTRAST CT CERVICAL SPINE WITHOUT CONTRAST TECHNIQUE: Multidetector CT imaging of the head and cervical spine was performed following the standard protocol without intravenous contrast. Multiplanar CT image reconstructions of the cervical spine were also generated. RADIATION DOSE REDUCTION: This exam was performed according to the departmental dose-optimization program which includes automated exposure control, adjustment of the mA  and/or kV according to patient size and/or use of iterative reconstruction technique. COMPARISON:  06/30/2021 and 04/26/2021 FINDINGS: CT HEAD FINDINGS Brain: No evidence of intracranial hemorrhage, acute infarction, hydrocephalus, extra-axial collection, or mass lesion/mass effect. Mild diffuse cerebral atrophy and chronic small vessel disease show no significant change. Vascular:  No hyperdense vessel or other acute findings. Skull: No evidence of fracture or other significant bone abnormality. Sinuses/Orbits:  No acute findings. Other: None. CT CERVICAL SPINE FINDINGS Alignment: Normal. Skull base and vertebrae: No acute fracture. No primary bone lesion or focal pathologic process. Soft tissues and spinal canal: No prevertebral fluid or swelling. No visible canal hematoma. Disc levels: Degenerative disc disease  is seen which is most severe at C5-6 and C6-7. Bilateral facet DJD is also seen at most cervical levels. These findings are stable since prior exam. Upper chest: No acute findings. Other: None. IMPRESSION: No acute intracranial abnormality. Stable cerebral atrophy and chronic small vessel disease. No evidence of acute cervical spine fracture or subluxation. Degenerative spondylosis, as described above. Electronically Signed   By: Marlaine Hind M.D.   On: 07/27/2021 14:20   CT HEAD WO CONTRAST (5MM)  Result Date: 06/30/2021 CLINICAL DATA:  Altered mental status. EXAM: CT HEAD WITHOUT CONTRAST TECHNIQUE: Contiguous axial images were obtained from the base of the skull through the vertex without intravenous contrast. RADIATION DOSE REDUCTION: This exam was performed according to the departmental dose-optimization program which includes automated exposure control, adjustment of the mA and/or kV according to patient size and/or use of iterative reconstruction technique. COMPARISON:  April 26, 2021 FINDINGS: Brain: There is mild cerebral atrophy with widening of the extra-axial spaces and ventricular  dilatation. There are areas of decreased attenuation within the white matter tracts of the supratentorial brain, consistent with microvascular disease changes. Vascular: No hyperdense vessel or unexpected calcification. Skull: Normal. Negative for fracture or focal lesion. Sinuses/Orbits: No acute finding. Other: None. IMPRESSION: 1. No acute intracranial abnormality. 2. Generalized cerebral atrophy with chronic white matter small vessel ischemic changes. Electronically Signed   By: Virgina Norfolk M.D.   On: 06/30/2021 20:20   CT Angio Chest Pulmonary Embolism (PE) W or WO Contrast  Result Date: 07/07/2021 CLINICAL DATA:  Shortness of breath. High probability for acute pulmonary embolus. EXAM: CT ANGIOGRAPHY CHEST WITH CONTRAST TECHNIQUE: Multidetector CT imaging of the chest was performed using the standard protocol during bolus administration of intravenous contrast. Multiplanar CT image reconstructions and MIPs were obtained to evaluate the vascular anatomy. RADIATION DOSE REDUCTION: This exam was performed according to the departmental dose-optimization program which includes automated exposure control, adjustment of the mA and/or kV according to patient size and/or use of iterative reconstruction technique. CONTRAST:  68m OMNIPAQUE IOHEXOL 350 MG/ML SOLN COMPARISON:  None FINDINGS: Cardiovascular: Satisfactory opacification of the pulmonary arteries to the segmental level. No evidence of pulmonary embolism. Aortic atherosclerosis. Normal heart size. No pericardial effusion. Mediastinum/Nodes: No enlarged mediastinal, hilar, or axillary lymph nodes. Thyroid gland, trachea, and esophagus demonstrate no significant findings. Lungs/Pleura: There is no pleural effusion. Bilateral patchy upper and lower lobe areas of ground-glass attenuation identified. There are also bilateral, lower lung zone predominant areas of peribronchial thickening with thickening of the peribronchovascular interstitium. Peripheral  predominant areas of lower lobe interstitial reticulation are also noted. No signs of lobar consolidation or pneumothorax. No suspicious pulmonary nodule or mass. Upper Abdomen: Nodular contour the liver is suggestive of cirrhosis. Cholecystectomy. Musculoskeletal: Spondylosis within the thoracic spine. Review of the MIP images confirms the above findings. IMPRESSION: 1. No evidence for acute pulmonary embolism. 2. Bilateral patchy upper and lower lobe predominant areas of ground-glass attenuation with peribronchial thickening and thickening of the peribronchovascular interstitium. Imaging findings are concerning for acute inflammatory/infectious process. Atypical viral infection is not excluded. 3. Nodular contour the liver suggestive of cirrhosis. 4. Aortic Atherosclerosis (ICD10-I70.0). Electronically Signed   By: TKerby MoorsM.D.   On: 07/07/2021 10:30   CT Cervical Spine Wo Contrast  Result Date: 07/27/2021 CLINICAL DATA:  Tripped and fell at home. Blunt head and neck trauma. Confusion. EXAM: CT HEAD WITHOUT CONTRAST CT CERVICAL SPINE WITHOUT CONTRAST TECHNIQUE: Multidetector CT imaging of the head and cervical spine was performed  following the standard protocol without intravenous contrast. Multiplanar CT image reconstructions of the cervical spine were also generated. RADIATION DOSE REDUCTION: This exam was performed according to the departmental dose-optimization program which includes automated exposure control, adjustment of the mA and/or kV according to patient size and/or use of iterative reconstruction technique. COMPARISON:  06/30/2021 and 04/26/2021 FINDINGS: CT HEAD FINDINGS Brain: No evidence of intracranial hemorrhage, acute infarction, hydrocephalus, extra-axial collection, or mass lesion/mass effect. Mild diffuse cerebral atrophy and chronic small vessel disease show no significant change. Vascular:  No hyperdense vessel or other acute findings. Skull: No evidence of fracture or other  significant bone abnormality. Sinuses/Orbits:  No acute findings. Other: None. CT CERVICAL SPINE FINDINGS Alignment: Normal. Skull base and vertebrae: No acute fracture. No primary bone lesion or focal pathologic process. Soft tissues and spinal canal: No prevertebral fluid or swelling. No visible canal hematoma. Disc levels: Degenerative disc disease is seen which is most severe at C5-6 and C6-7. Bilateral facet DJD is also seen at most cervical levels. These findings are stable since prior exam. Upper chest: No acute findings. Other: None. IMPRESSION: No acute intracranial abnormality. Stable cerebral atrophy and chronic small vessel disease. No evidence of acute cervical spine fracture or subluxation. Degenerative spondylosis, as described above. Electronically Signed   By: Marlaine Hind M.D.   On: 07/27/2021 14:20   DG Pelvis Portable  Result Date: 07/27/2021 CLINICAL DATA:  Pain after fall EXAM: PORTABLE PELVIS 1-2 VIEWS COMPARISON:  None Available. FINDINGS: There is no evidence of pelvic fracture or diastasis. No pelvic bone lesions are seen. IMPRESSION: Negative. Electronically Signed   By: Dorise Bullion III M.D.   On: 07/27/2021 14:05   US Venous Img Lower Bilateral (DVT)  Result Date: 07/27/2021 CLINICAL DATA:  Swelling, pain and redness EXAM: BILATERAL LOWER EXTREMITY VENOUS DOPPLER ULTRASOUND TECHNIQUE: Gray-scale sonography with graded compression, as well as color Doppler and duplex ultrasound were performed to evaluate the lower extremity deep venous systems from the level of the common femoral vein and including the common femoral, femoral, profunda femoral, popliteal and calf veins including the posterior tibial, peroneal and gastrocnemius veins when visible. The superficial great saphenous vein was also interrogated. Spectral Doppler was utilized to evaluate flow at rest and with distal augmentation maneuvers in the common femoral, femoral and popliteal veins. COMPARISON:  None Available.  FINDINGS: RIGHT LOWER EXTREMITY Common Femoral Vein: No evidence of thrombus. Normal compressibility, respiratory phasicity and response to augmentation. Saphenofemoral Junction: No evidence of thrombus. Normal compressibility and flow on color Doppler imaging. Profunda Femoral Vein: No evidence of thrombus. Normal compressibility and flow on color Doppler imaging. Femoral Vein: No evidence of thrombus. Normal compressibility, respiratory phasicity and response to augmentation. Popliteal Vein: No evidence of thrombus. Normal compressibility, respiratory phasicity and response to augmentation. Calf Veins: No evidence of thrombus. Normal compressibility and flow on color Doppler imaging. Superficial Great Saphenous Vein: No evidence of thrombus. Normal compressibility. Venous Reflux:  None. Other Findings:  None. LEFT LOWER EXTREMITY Common Femoral Vein: No evidence of thrombus. Normal compressibility, respiratory phasicity and response to augmentation. Saphenofemoral Junction: No evidence of thrombus. Normal compressibility and flow on color Doppler imaging. Profunda Femoral Vein: No evidence of thrombus. Normal compressibility and flow on color Doppler imaging. Femoral Vein: No evidence of thrombus. Normal compressibility, respiratory phasicity and response to augmentation. Popliteal Vein: No evidence of thrombus. Normal compressibility, respiratory phasicity and response to augmentation. Calf Veins: No evidence of thrombus. Normal compressibility and flow on color Doppler imaging.  Superficial Great Saphenous Vein: No evidence of thrombus. Normal compressibility. Venous Reflux:  None. Other Findings:  None. IMPRESSION: No evidence of deep venous thrombosis in either lower extremity. Electronically Signed   By: Jacqulynn Cadet M.D.   On: 07/27/2021 20:19   DG Chest Portable 1 View  Result Date: 07/27/2021 CLINICAL DATA:  Weakness.  Pain. EXAM: PORTABLE CHEST 1 VIEW COMPARISON:  Jul 04, 2021 FINDINGS: Stable  cardiomegaly. The hila and mediastinum are unchanged. No pneumothorax. No nodules or masses. Blunting of left costophrenic angle. No overt edema or suspicious infiltrates. Mild interstitial prominence. IMPRESSION: 1. Cardiomegaly, a probable tiny left pleural effusion, and possible pulmonary venous congestion without overt edema. Electronically Signed   By: Dorise Bullion III M.D.   On: 07/27/2021 14:06   DG Chest Port 1 View  Result Date: 06/30/2021 CLINICAL DATA:  Altered mental status EXAM: PORTABLE CHEST 1 VIEW COMPARISON:  Chest x-ray dated Jun 30, 2021 FINDINGS: Unchanged enlarged cardiac and mediastinal contours. Lungs are clear. No large pleural effusion or pneumothorax. IMPRESSION: No active disease. Electronically Signed   By: Yetta Glassman M.D.   On: 06/30/2021 17:35   DG Foot 2 Views Right  Result Date: 07/28/2021 CLINICAL DATA:  Injury of second toe. EXAM: RIGHT FOOT - 2 VIEW COMPARISON:  None Available. FINDINGS: AP and lateral views of the foot obtained. There is no evidence of second toe fracture or dislocation. There is lateral subluxation of the fifth toe at the proximal interphalangeal joint. Subchondral cyst or erosion is noted involving the proximal phalanx at the interphalangeal joint. Acuity is uncertain. Minor degenerative change in the midfoot with dorsal spurring. Diffuse soft tissue edema. No soft tissue gas or radiopaque foreign body. Irregular calcifications in the region of the mid Achilles. IMPRESSION: 1. Lateral subluxation of the fifth toe at the proximal interphalangeal joint, of uncertain acuity. There is an a erosion or subchondral cyst involving the proximal phalanx at the interphalangeal joint. 2. No fracture or dislocation of the second toe. 3. Diffuse soft tissue edema. Electronically Signed   By: Keith Rake M.D.   On: 07/28/2021 14:30   ECHOCARDIOGRAM COMPLETE  Result Date: 07/07/2021    ECHOCARDIOGRAM REPORT   Patient Name:   [name] Date of Exam:  07/07/2021 Medical Rec #:  [id number]      Height:       64.0 in Accession #:    [id number]     Weight:       258.2 lb Date of Birth:  May 08, 1930      BSA:          2.181 m Patient Age:    16 years       BP:           98/45 mmHg Patient Gender: F              HR:           63 bpm. Exam Location:  Inpatient Procedure: 2D Echo, Cardiac Doppler, Color Doppler and Intracardiac            Opacification Agent Indications:     Cardiomyopathy-Unspecified I42.9  History:         Patient has prior history of Echocardiogram examinations, most                  recent 07/07/2020. COPD and PAD; Risk Factors:Former Smoker and                  Hypertension.  Covid. Thyroid disease. GERD.  Sonographer:     Darlina Sicilian RDCS Referring Phys:  [id number] Jeanella Flattery AMIN Diagnosing Phys: Ida Rogue MD  Sonographer Comments: Suboptimal parasternal window and suboptimal apical window. IMPRESSIONS  1. Left ventricular ejection fraction, by estimation, is 55 to 60%. The left ventricle has normal function. The left ventricle has no regional wall motion abnormalities. Left ventricular diastolic parameters are indeterminate.  2. Right ventricular systolic function is normal. The right ventricular size is normal.  3. The mitral valve was not well visualized. Mild mitral valve regurgitation. No evidence of mitral stenosis.  4. The aortic valve was not well visualized. Aortic valve regurgitation is not visualized. No aortic stenosis is present.  5. The inferior vena cava is normal in size with greater than 50% respiratory variability, suggesting right atrial pressure of 3 mmHg. FINDINGS  Left Ventricle: Left ventricular ejection fraction, by estimation, is 55 to 60%. The left ventricle has normal function. The left ventricle has no regional wall motion abnormalities. The left ventricular internal cavity size was normal in size. There is  no left ventricular hypertrophy. Left ventricular diastolic parameters are indeterminate. Right Ventricle:  The right ventricular size is normal. No increase in right ventricular wall thickness. Right ventricular systolic function is normal. Left Atrium: Left atrial size was normal in size. Right Atrium: Right atrial size was normal in size. Pericardium: There is no evidence of pericardial effusion. Mitral Valve: The mitral valve was not well visualized. Mild mitral valve regurgitation. No evidence of mitral valve stenosis. Tricuspid Valve: The tricuspid valve is not well visualized. Tricuspid valve regurgitation is mild . No evidence of tricuspid stenosis. Aortic Valve: The aortic valve was not well visualized. Aortic valve regurgitation is not visualized. No aortic stenosis is present. Pulmonic Valve: The pulmonic valve was not well visualized. Pulmonic valve regurgitation is not visualized. No evidence of pulmonic stenosis. Aorta: The aortic root was not well visualized, aortic root could not be assessed and the ascending aorta was not well visualized. Venous: The inferior vena cava is normal in size with greater than 50% respiratory variability, suggesting right atrial pressure of 3 mmHg. IAS/Shunts: No atrial level shunt detected by color flow Doppler.  LEFT VENTRICLE PLAX 2D LVIDd:         4.30 cm      Diastology LVIDs:         2.80 cm      LV e' medial:    4.65 cm/s LV PW:         1.00 cm      LV E/e' medial:  21.0 LV IVS:        1.20 cm      LV e' lateral:   6.45 cm/s LVOT diam:     1.90 cm      LV E/e' lateral: 15.1 LV SV:         48 LV SV Index:   22 LVOT Area:     2.84 cm  LV Volumes (MOD) LV vol d, MOD A2C: 92.1 ml LV vol d, MOD A4C: 117.0 ml LV vol s, MOD A2C: 48.1 ml LV vol s, MOD A4C: 44.4 ml LV SV MOD A2C:     44.0 ml LV SV MOD A4C:     117.0 ml LV SV MOD BP:      57.7 ml RIGHT VENTRICLE RV S prime:     7.50 cm/s TAPSE (M-mode): 1.4 cm LEFT ATRIUM  Index        RIGHT ATRIUM          Index LA diam:      3.50 cm 1.61 cm/m   RA Area:     8.98 cm LA Vol (A2C): 45.8 ml 21.00 ml/m  RA Volume:    15.80 ml 7.25 ml/m LA Vol (A4C): 59.8 ml 27.42 ml/m  AORTIC VALVE LVOT Vmax:   78.65 cm/s LVOT Vmean:  49.200 cm/s LVOT VTI:    0.169 m  AORTA Ao Root diam: 3.00 cm Ao Asc diam:  3.40 cm MITRAL VALVE MV Area (PHT): 5.42 cm    SHUNTS MV Decel Time: 140 msec    Systemic VTI:  0.17 m MV E velocity: 97.55 cm/s  Systemic Diam: 1.90 cm Ida Rogue MD Electronically signed by Ida Rogue MD Signature Date/Time: 07/07/2021/1:19:06 PM    Final     Microbiology: Results for orders placed or performed during the hospital encounter of 07/27/21  Blood culture (routine x 2)     Status: None (Preliminary result)   Collection Time: 07/27/21  1:40 PM   Specimen: BLOOD  Result Value Ref Range Status   Specimen Description BLOOD BLOOD RIGHT FOREARM  Final   Special Requests   Final    BOTTLES DRAWN AEROBIC AND ANAEROBIC Blood Culture adequate volume   Culture   Final    NO GROWTH 3 DAYS Performed at [facility], [address] [place], [place] [address]    Report Status PENDING  Incomplete  Blood culture (routine x 2)     Status: None (Preliminary result)   Collection Time: 07/27/21  1:45 PM   Specimen: BLOOD  Result Value Ref Range Status   Specimen Description BLOOD RIGHT ANTECUBITAL  Final   Special Requests   Final    BOTTLES DRAWN AEROBIC AND ANAEROBIC Blood Culture adequate volume   Culture   Final    NO GROWTH 3 DAYS Performed at [facility], [place], [address] [place], [place] [address]    Report Status PENDING  Incomplete  MRSA Next Gen by PCR, Nasal     Status: None   Collection Time: 07/28/21  6:49 PM   Specimen: Nasal Mucosa; Nasal Swab  Result Value Ref Range Status   MRSA by PCR Next Gen NOT DETECTED NOT DETECTED Final    Comment: (NOTE) The GeneXpert MRSA Assay (FDA approved for NASAL specimens only), is one component of a comprehensive MRSA colonization surveillance program. It is not intended to diagnose MRSA infection nor to guide or monitor treatment  for MRSA infections. Test performance is not FDA approved in patients less than 65 years old. Performed at [facility], [address] [place], [place] [address]     Labs: CBC: Recent Labs  Lab 07/27/21 1340 07/28/21 0448  WBC 7.2 7.7  NEUTROABS 4.0  --   HGB 11.4* 11.1*  HCT 38.0 35.9*  MCV 83.7 81.2  PLT 291 592   Basic Metabolic Panel: Recent Labs  Lab 07/27/21 1340 07/28/21 0448 07/29/21 0636 07/30/21 0940  NA 140  --  141 139  K 4.2  --  4.1 4.0  CL 106  --  107 104  CO2 29  --  28 29  GLUCOSE 130*  --  113* 126*  BUN 27*  --  28* 28*  CREATININE 1.10* 0.84 1.09* 0.84  CALCIUM 10.4*  --  10.4* 10.5*   Liver Function Tests: Recent Labs  Lab 07/27/21 1340  AST  20  ALT 13  ALKPHOS 81  BILITOT 0.6  PROT 7.1  ALBUMIN 3.2*   CBG: No results for input(s): GLUCAP in the last 168 hours.  Discharge time spent: greater than 30 minutes.  This record has been created using Systems analyst. Errors have been sought and corrected,but may not always be located. Such creation errors do not reflect on the standard of care.   Signed: Lorella Nimrod, MD [facility] 07/30/2021

## 2021-07-30 NOTE — Plan of Care (Signed)
  Problem: Education: Goal: Knowledge of General Education information will improve Description: Including pain rating scale, medication(s)/side effects and non-pharmacologic comfort measures Outcome: Progressing   Problem: Health Behavior/Discharge Planning: Goal: Ability to manage health-related needs will improve Outcome: Progressing   Problem: Clinical Measurements: Goal: Ability to maintain clinical measurements within normal limits will improve Outcome: Progressing   Problem: Clinical Measurements: Goal: Will remain free from infection Outcome: Progressing   Problem: Clinical Measurements: Goal: Diagnostic test results will improve Outcome: Progressing   Problem: Clinical Measurements: Goal: Respiratory complications will improve Outcome: Progressing   Problem: Clinical Measurements: Goal: Cardiovascular complication will be avoided Outcome: Progressing   Problem: Activity: Goal: Risk for activity intolerance will decrease Outcome: Progressing   Problem: Nutrition: Goal: Adequate nutrition will be maintained Outcome: Progressing   Problem: Coping: Goal: Level of anxiety will decrease Outcome: Progressing   Problem: Elimination: Goal: Will not experience complications related to bowel motility Outcome: Progressing   Problem: Elimination: Goal: Will not experience complications related to urinary retention Outcome: Progressing   Problem: Pain Managment: Goal: General experience of comfort will improve Outcome: Progressing   Problem: Safety: Goal: Ability to remain free from injury will improve Outcome: Progressing   Problem: Skin Integrity: Goal: Risk for impaired skin integrity will decrease Outcome: Progressing   

## 2021-07-30 NOTE — NC FL2 (Signed)
[place] LEVEL OF CARE SCREENING TOOL     IDENTIFICATION  Patient Name: [name] Birthdate: 1930/07/16 Sex: female Admission Date (Current Location): 07/27/2021  [facility] and [place] Number:  Engineering geologist and Address:  [facility], [address] [place], [place] [address]      Provider Number: [id number]  Attending Physician Name and Address:  Lorella Nimrod, MD  Relative Name and Phone Number:  Marthe Patch [phone]    Current Level of Care: Hospital Recommended Level of Care: [place] Prior Approval Number:    Date Approved/Denied:   PASRR Number:    Discharge Plan: Other (Comment) (Assisted living)    Current Diagnoses: Patient Active Problem List   Diagnosis Date Noted   Cellulitis of left lower extremity 07/29/2021   Bilateral cellulitis of lower leg 07/27/2021   Depression with anxiety 07/27/2021   Fall 07/27/2021   Chronic kidney disease, stage 3a ([place]) 07/27/2021   COVID-19 virus infection 07/04/2021   Acute hypoxemic respiratory failure due to COVID-19 ([place]) 07/04/2021   Asthma-COPD overlap syndrome ([place]) 07/04/2021   Frequent falls 04/26/2021   Nausea vomiting and diarrhea 02/25/2021   Atrial fibrillation with RVR ([place]) 02/25/2021   Acute renal failure superimposed on stage 3a chronic kidney disease ([place]) 02/25/2021   Chronic diastolic CHF (congestive heart failure) ([place]) 02/25/2021   Severe sepsis ([place]) 02/25/2021   Lower extremity cellulitis 02/25/2021   Allergic rhinitis 08/10/2020   Peripheral neuropathy 08/10/2020   Acute delirium    Hypomagnesemia    Sepsis due to group B Streptococcus without acute organ dysfunction (HCC)    Acute on chronic diastolic CHF (congestive heart failure) (HCC)    Atrial fibrillation, chronic ([place])    Depression    Sepsis secondary to UTI ([place]) 07/05/2020   COPD with acute exacerbation ([place])    Hypothyroidism    Hypertension    CKD (chronic  kidney disease) stage 3, GFR 30-59 ml/min (HCC)    Chronic kidney disease 06/21/2020   Secondary hyperaldosteronism ([place]) 06/16/2020   Stasis dermatitis of both legs 07/28/2017   Pruritic rash 07/03/2017   Gait instability 05/27/2017   CHF (congestive heart failure) ([place]) 03/27/2017   Lower extremity ulceration, left, limited to breakdown of skin ([place]) 03/27/2017   High risk medication use 03/12/2017   Primary hyperparathyroidism ([place]) 03/12/2017   Impaired glucose tolerance 03/12/2017   Noncompliance 03/12/2017   Parathyroid adenoma 03/12/2017   Chronic renal insufficiency, stage 3 (moderate) ([place]) 03/12/2017   Mild reactive airways disease 03/08/2016   Influenza 03/06/2016   Morbid obesity with BMI of 45.0-49.9, adult ([place]) 02/18/2016   OSA (obstructive sleep apnea) 02/18/2016   CKD (chronic kidney disease), stage II 02/12/2016   Swelling of limb 02/12/2016   Pain in toes of both feet 01/14/2016   Varicose veins of both lower extremities with pain 01/14/2016   Lymphedema 01/14/2016   Longstanding persistent atrial fibrillation ([place]) 10/04/2015   Atrophic vaginitis 12/13/2014   Hypercalcemia 12/13/2014   Hyperparathyroidism ([place]) 12/13/2014   Chronic UTI (urinary tract infection) 11/29/2014   Hyperlipidemia 01/03/2014   Bilateral edema of lower extremity 12/08/2013   SOB (shortness of breath) 12/08/2013   Anxiety 06/15/2013   GERD (gastroesophageal reflux disease) 06/15/2013   Osteoarthritis 06/15/2013   COPD (chronic obstructive pulmonary disease) ([place]) 06/15/2013   Incomplete emptying of bladder 05/25/2013   Mixed incontinence 37/62/8315   Renal colic 17/61/6073   Renal cyst, acquired 05/25/2013   Sciatica 05/25/2013   Chronic  cystitis 05/25/2013    Orientation RESPIRATION BLADDER Height & Weight     Self, Time, Situation, Place  Normal Continent Weight: 117.5 kg Height:  '5\' 4"'$  (162.6 cm)  BEHAVIORAL SYMPTOMS/MOOD NEUROLOGICAL BOWEL NUTRITION STATUS      Continent  Diet (see dc summary)  AMBULATORY STATUS COMMUNICATION OF NEEDS Skin   Limited Assist Verbally Normal                       Personal Care Assistance Level of Assistance    Bathing Assistance: Limited assistance Feeding assistance: Independent Dressing Assistance: Limited assistance     Functional Limitations Info  Sight, Hearing Sight Info: Impaired Hearing Info: Impaired      SPECIAL CARE FACTORS FREQUENCY  PT (By licensed PT)     PT Frequency: 2 times per week              Contractures Contractures Info: Not present    Additional Factors Info  Code Status, Allergies Code Status Info: partial Allergies Info: Prednisone, Aspirin, Sulfa Antibiotics, Sulfacetamide Sodium           Current Medications (07/30/2021):  This is the current hospital active medication list Current Facility-Administered Medications  Medication Dose Route Frequency Provider Last Rate Last Admin   acetaminophen (TYLENOL) tablet 650 mg  650 mg Oral Q6H PRN Ivor Costa, MD   650 mg at 07/28/21 1752   albuterol (PROVENTIL) (2.5 MG/3ML) 0.083% nebulizer solution 2.5 mg  2.5 mg Inhalation Q4H PRN Ivor Costa, MD       apixaban Arne [place]) tablet 5 mg  5 mg Oral BID Ivor Costa, MD   5 mg at 07/30/21 0901   cefTRIAXone (ROCEPHIN) 2 g in sodium chloride 0.9 % 100 mL IVPB  2 g Intravenous Q24H Ivor Costa, MD   Stopped at 07/29/21 1650   chlorhexidine (PERIDEX) 0.12 % solution 15 mL  15 mL Mouth Rinse BID Alanda Amass, Aseem, MD   15 mL at 07/30/21 0901   dextromethorphan-guaiFENesin ([place] DM) 30-600 MG per 12 hr tablet 1 tablet  1 tablet Oral BID PRN Ivor Costa, MD       DULoxetine (CYMBALTA) DR capsule 60 mg  60 mg Oral Daily Ivor Costa, MD   60 mg at 07/30/21 0902   famotidine (PEPCID) tablet 20 mg  20 mg Oral QHS Ivor Costa, MD   20 mg at 07/29/21 2054   furosemide (LASIX) tablet 20 mg  20 mg Oral BID Ivor Costa, MD   20 mg at 07/30/21 8144   hydrALAZINE (APRESOLINE) injection 5 mg  5 mg Intravenous Q2H  PRN Ivor Costa, MD       hydrOXYzine (ATARAX) tablet 10 mg  10 mg Oral BID Ivor Costa, MD   10 mg at 07/30/21 0902   ipratropium (ATROVENT) nebulizer solution 0.5 mg  0.5 mg Nebulization Q4H PRN Ivor Costa, MD       levothyroxine (SYNTHROID) tablet 150 mcg  150 mcg Oral Daily Ivor Costa, MD   150 mcg at 07/30/21 0538   loratadine (CLARITIN) tablet 10 mg  10 mg Oral Daily Ivor Costa, MD   10 mg at 07/30/21 0902   MEDLINE mouth rinse  15 mL Mouth Rinse q12n4p Alanda Amass, Aseem, MD   15 mL at 07/29/21 1657   metoprolol succinate (TOPROL-XL) 24 hr tablet 12.5 mg  12.5 mg Oral QHS Ivor Costa, MD   12.5 mg at 07/29/21 2054   montelukast (SINGULAIR) tablet 10 mg  10 mg Oral QHS  Ivor Costa, MD   10 mg at 07/29/21 2054   multivitamin with minerals tablet 1 tablet  1 tablet Oral Daily Ivor Costa, MD   1 tablet at 07/30/21 0902   multivitamin-lutein (OCUVITE-LUTEIN) capsule 1 capsule  1 capsule Oral Daily Ivor Costa, MD   1 capsule at 07/30/21 0902   nystatin cream (MYCOSTATIN) 1 application.  1 application. Topical TID Ivor Costa, MD   1 application. at 07/30/21 0901   ondansetron (ZOFRAN) injection 4 mg  4 mg Intravenous Q8H PRN Ivor Costa, MD       oxyCODONE-acetaminophen (PERCOCET/ROXICET) 5-325 MG per tablet 1 tablet  1 tablet Oral Q4H PRN Ivor Costa, MD   1 tablet at 07/29/21 0847   triamcinolone cream (KENALOG) 0.1 % cream 1 application.  1 application. Topical BID Ivor Costa, MD   1 application. at 07/30/21 0905   vitamin B-12 (CYANOCOBALAMIN) tablet 500 mcg  500 mcg Oral Daily Ivor Costa, MD   500 mcg at 07/30/21 4097     Discharge Medications: Please see discharge summary for a list of discharge medications.  Relevant Imaging Results:  Relevant Lab Results:   Additional Information Patient SS# [id number]  Conception Oms, RN

## 2021-07-30 NOTE — Progress Notes (Signed)
PT REFUSED CPAP for HS. NARD/SOB noted, stable on room air, RN notified.

## 2021-07-31 NOTE — Plan of Care (Signed)
  Problem: Education: Goal: Knowledge of General Education information will improve Description: Including pain rating scale, medication(s)/side effects and non-pharmacologic comfort measures Outcome: Progressing   Problem: Clinical Measurements: Goal: Ability to maintain clinical measurements within normal limits will improve Outcome: Progressing   Problem: Clinical Measurements: Goal: Will remain free from infection Outcome: Progressing   Problem: Clinical Measurements: Goal: Diagnostic test results will improve Outcome: Progressing   Problem: Clinical Measurements: Goal: Respiratory complications will improve Outcome: Progressing   Problem: Clinical Measurements: Goal: Cardiovascular complication will be avoided Outcome: Progressing   Problem: Activity: Goal: Risk for activity intolerance will decrease Outcome: Progressing   Problem: Nutrition: Goal: Adequate nutrition will be maintained Outcome: Progressing   Problem: Coping: Goal: Level of anxiety will decrease Outcome: Progressing   Problem: Elimination: Goal: Will not experience complications related to bowel motility Outcome: Progressing   Problem: Elimination: Goal: Will not experience complications related to urinary retention Outcome: Progressing   Problem: Pain Managment: Goal: General experience of comfort will improve Outcome: Progressing   Problem: Safety: Goal: Ability to remain free from injury will improve Outcome: Progressing   Problem: Skin Integrity: Goal: Risk for impaired skin integrity will decrease Outcome: Progressing

## 2021-07-31 NOTE — Progress Notes (Signed)
Blood pressure (!) 135/96, pulse (!) 57, temperature (!) 97.3 F (36.3 C), resp. rate 16, height '5\' 4"'$  (1.626 m), weight 116.6 kg, SpO2 98 %. Pt removed IV 6/6, got pt dressed and pt to return to ALF 6/7 discussed packet with granddaughter at bed side and pt transported via w/c for family to take to ALF

## 2021-07-31 NOTE — TOC Progression Note (Addendum)
Transition of Care [facility]) - Progression Note    Patient Details  Name: [name] MRN: [id number] Date of Birth: [birth year]  Transition of Care [facility]) CM/SW [place], RN Phone Number: 07/31/2021, 12:17 PM  Clinical Narrative:     Spoke with Mariel Craft, the patient was evaluated and is good to go back, she will check to see if they can transport , she will let me know  They are not able to pick her up Her daughter [name] will transport  Expected Discharge Plan: Assisted Living Barriers to Discharge: Continued Medical Work up  Expected Discharge Plan and Services Expected Discharge Plan: Assisted Living   Discharge Planning Services: CM Consult   Living arrangements for the past 2 months: [place] Expected Discharge Date: 07/30/21               DME Arranged: 3-N-1 DME Agency: [employer] Date DME Agency Contacted: 07/30/21 Time DME Agency Contacted: 45 Representative spoke with at DME Agency: Suanne Marker HH Arranged: OT, PT [place] Agency: [facility] Date [place]: 07/30/21 Time [place]: 1108 Representative spoke with at [place]: [place]) Interventions    Readmission Risk Interventions    04/27/2021    1:16 PM  Readmission Risk Prevention Plan  Post Dischage Appt Complete  Medication Screening Complete  Transportation Screening Complete

## 2021-08-01 LAB — CULTURE, BLOOD (ROUTINE X 2)
Culture: NO GROWTH
Culture: NO GROWTH
Special Requests: ADEQUATE
Special Requests: ADEQUATE

## 2021-08-30 ENCOUNTER — Ambulatory Visit: Payer: Medicare HMO | Admitting: Family

## 2021-09-01 ENCOUNTER — Emergency Department
Admission: EM | Admit: 2021-09-01 | Discharge: 2021-09-01 | Disposition: A | Discharge status: Home / Self Care | Payer: Medicare HMO | Attending: Emergency Medicine | Admitting: Emergency Medicine

## 2021-09-01 ENCOUNTER — Emergency Department: Payer: Medicare HMO

## 2021-09-01 ENCOUNTER — Other Ambulatory Visit: Payer: Self-pay

## 2021-09-01 DIAGNOSIS — J441 Chronic obstructive pulmonary disease with (acute) exacerbation: Secondary | ICD-10-CM | POA: Diagnosis not present

## 2021-09-01 DIAGNOSIS — R0789 Other chest pain: Secondary | ICD-10-CM

## 2021-09-01 DIAGNOSIS — R079 Chest pain, unspecified: Secondary | ICD-10-CM | POA: Diagnosis not present

## 2021-09-01 LAB — CBC
HCT: 38.2 % (ref 36.0–46.0)
Hemoglobin: 11.7 g/dL — ABNORMAL LOW (ref 12.0–15.0)
MCH: 26.2 pg (ref 26.0–34.0)
MCHC: 30.6 g/dL (ref 30.0–36.0)
MCV: 85.7 fL (ref 80.0–100.0)
Platelets: 252 10*3/uL (ref 150–400)
RBC: 4.46 MIL/uL (ref 3.87–5.11)
RDW: 16.9 % — ABNORMAL HIGH (ref 11.5–15.5)
WBC: 6.7 10*3/uL (ref 4.0–10.5)
nRBC: 0 % (ref 0.0–0.2)

## 2021-09-01 LAB — BASIC METABOLIC PANEL
Anion gap: 3 — ABNORMAL LOW (ref 5–15)
BUN: 17 mg/dL (ref 8–23)
CO2: 32 mmol/L (ref 22–32)
Calcium: 10.2 mg/dL (ref 8.9–10.3)
Chloride: 105 mmol/L (ref 98–111)
Creatinine, Ser: 0.96 mg/dL (ref 0.44–1.00)
GFR, Estimated: 57 mL/min — ABNORMAL LOW (ref >60)
Glucose, Bld: 106 mg/dL — ABNORMAL HIGH (ref 70–99)
Potassium: 3.7 mmol/L (ref 3.5–5.1)
Sodium: 140 mmol/L (ref 135–145)

## 2021-09-01 LAB — TROPONIN I (HIGH SENSITIVITY): Troponin I (High Sensitivity): 10 ng/L

## 2021-09-01 MED ORDER — IPRATROPIUM-ALBUTEROL 0.5-2.5 (3) MG/3ML IN SOLN
RESPIRATORY_TRACT | Status: AC
  Filled 2021-09-01: qty 6

## 2021-09-01 MED ORDER — METHYLPREDNISOLONE 4 MG PO TBPK
ORAL_TABLET | ORAL | 0 refills | Status: DC
Start: 2021-09-01 — End: 2021-09-19

## 2021-09-01 MED ORDER — METHYLPREDNISOLONE 4 MG PO TBPK: ORAL_TABLET | ORAL | 0 refills | Status: DC

## 2021-09-01 MED ORDER — METHYLPREDNISOLONE SODIUM SUCC 125 MG IJ SOLR
125.0000 mg | Freq: Once | INTRAMUSCULAR | Status: AC
Start: 2021-09-01 — End: 2021-09-01
  Administered 2021-09-01: 125 mg via INTRAVENOUS
  Filled 2021-09-01: qty 2

## 2021-09-01 MED ORDER — IPRATROPIUM-ALBUTEROL 0.5-2.5 (3) MG/3ML IN SOLN
6.0000 mL | Freq: Once | RESPIRATORY_TRACT | Status: AC
  Administered 2021-09-01: 6 mL via RESPIRATORY_TRACT

## 2021-09-01 MED ORDER — DOXYCYCLINE HYCLATE 50 MG PO CAPS: 100.0000 mg | ORAL_CAPSULE | Freq: Two times a day (BID) | ORAL | 0 refills | Status: DC

## 2021-09-01 MED ORDER — DOXYCYCLINE HYCLATE 50 MG PO CAPS: 100.0000 mg | ORAL_CAPSULE | Freq: Two times a day (BID) | ORAL | 0 refills | Status: AC

## 2021-09-01 NOTE — ED Triage Notes (Addendum)
Patient to ER via [facility] from [facility] with complaints of centralized, non-radiating chest pain she describes as sharp. States it started this morning after eating breakfast. Reports this has now resolved. Patient also wheezing. Reports hx of COPD and CHF.  Cellulitis present to bilateral lower extremities.

## 2021-09-01 NOTE — ED Notes (Addendum)
EMS placed pt in wheel chair with 3 people assisting . While doing so pt began to urinate into floor. Pt brief changed and socks and pt placed on clean bad in wheel chair and pad sent to car with pt

## 2021-09-01 NOTE — ED Notes (Signed)
[place] at [facility] given report

## 2021-09-01 NOTE — ED Provider Notes (Signed)
[facility] Provider Note   Event Date/Time   First MD Initiated Contact with Patient 09/01/21 1219     (approximate) History  Chest Pain  HPI [name] is a 88 y.o. female  Location: Right chest Duration: Began this morning Timing: Resolved since onset Severity: Currently 0/10 Quality: Aching pain Context: Patient states that when she woke this morning and while she was eating breakfast she noticed a aching right-sided nonradiating chest pain that resolved after EMS arrival without intervention Modifying factors: Denies Associated Symptoms: Shortness of breath, wheezing ROS: Patient currently denies any vision changes, tinnitus, difficulty speaking, facial droop, sore throat, abdominal pain, nausea/vomiting/diarrhea, dysuria, or weakness/numbness/paresthesias in any extremity   Physical Exam  Triage Vital Signs: ED Triage Vitals  Enc Vitals Group     BP 09/01/21 1220 (!) 130/52     Pulse Rate 09/01/21 1221 (!) 55     Resp 09/01/21 1225 12     Temp 09/01/21 1225 97.7 F (36.5 C)     Temp Source 09/01/21 1225 Oral     SpO2 09/01/21 1221 100 %     Weight --      Height 09/01/21 1221 '5\' 4"'$  (1.626 m)     Head Circumference --      Peak Flow --      Pain Score 09/01/21 1221 0     Pain Loc --      Pain Edu? --      Excl. in [place]? --    Most recent vital signs: Vitals:   09/01/21 1303 09/01/21 1400  BP:  132/78  Pulse: 78 63  Resp: 18 13  Temp:  98 F (36.7 C)  SpO2: 100% 100%   General: Awake, oriented x4. CV:  Good peripheral perfusion.  Resp:  Normal effort.  Inspiratory and expiratory wheezes over bilateral lung fields Abd:  No distention.  Other:  Morbidly obese elderly Caucasian female laying in bed in no acute distress ED Results / Procedures / Treatments  Labs (all labs ordered are listed, but only abnormal results are displayed) Labs Reviewed  BASIC METABOLIC PANEL - Abnormal; Notable for the following components:      Result  Value   Glucose, Bld 106 (*)    GFR, Estimated 57 (*)    Anion gap 3 (*)    All other components within normal limits  CBC - Abnormal; Notable for the following components:   Hemoglobin 11.7 (*)    RDW 16.9 (*)    All other components within normal limits  TROPONIN I (HIGH SENSITIVITY)  TROPONIN I (HIGH SENSITIVITY)   EKG ED ECG REPORT I, Naaman Plummer, the attending physician, personally viewed and interpreted this ECG. Date: 09/01/2021 EKG Time: 1224 Rate: 66 Rhythm: Atrial fibrillation QRS Axis: normal Intervals: normal ST/T Wave abnormalities: normal Narrative Interpretation: Atrial fibrillation.  No evidence of acute ischemia RADIOLOGY ED MD interpretation: 2 view chest x-ray interpreted by me showing cardiomegaly with mild diffuse bilateral interstitial pulmonary opacities -Agree with radiology assessment Official radiology report(s): DG Chest 2 View  Result Date: 09/01/2021 CLINICAL DATA:  Chest pain EXAM: CHEST - 2 VIEW COMPARISON:  07/27/2021 FINDINGS: Cardiomegaly. Mild, diffuse bilateral interstitial pulmonary opacity. The visualized skeletal structures are unremarkable. IMPRESSION: Cardiomegaly with mild, diffuse bilateral interstitial pulmonary opacity, likely edema. No focal airspace opacity. Electronically Signed   By: Delanna Ahmadi M.D.   On: 09/01/2021 13:05   PROCEDURES: Critical Care performed: No .1-3 Lead EKG Interpretation  Performed by:  Naaman Plummer, MD Authorized by: Naaman Plummer, MD     Interpretation: normal     ECG rate:  62   ECG rate assessment: normal     Rhythm: sinus rhythm     Ectopy: none     Conduction: normal    MEDICATIONS ORDERED IN ED: Medications  ipratropium-albuterol (DUONEB) 0.5-2.5 (3) MG/3ML nebulizer solution 6 mL ( Nebulization Canceled Entry 09/01/21 1249)  methylPREDNISolone sodium succinate (SOLU-MEDROL) 125 mg/2 mL injection 125 mg (125 mg Intravenous Given 09/01/21 1241)   IMPRESSION / MDM / ASSESSMENT AND PLAN / ED  COURSE  I reviewed the triage vital signs and the nursing notes.                             The patient is on the cardiac monitor to evaluate for evidence of arrhythmia and/or significant heart rate changes. Patient's presentation is most consistent with exacerbation of chronic illness. The patient appears to be suffering from a moderate exacerbation of COPD.  Based on the history, exam, CXR/EKG, and further workup I dont suspect any other emergent cause of this presentation, such as pneumonia, acute coronary syndrome, congestive heart failure, pulmonary embolism, or pneumothorax.  ED Interventions: bronchodilators, steroids, antibiotics, reassess  1414 Reassessment: After treatment, the patients shortness of breath is resolved, and their lung exam has returned to baseline. They are comfortable and want to go home.  Rx: Steroids, Antibiotics, Albuterol Disposition: Discharge home with SRP. PCP follow up recommended in next 48hours.   FINAL CLINICAL IMPRESSION(S) / ED DIAGNOSES   Final diagnoses:  Intermittent right-sided chest pain  COPD with acute exacerbation ([place])   Rx / DC Orders   ED Discharge Orders          Ordered    doxycycline (VIBRAMYCIN) 50 MG capsule  2 times daily,   Status:  Discontinued        09/01/21 1443    methylPREDNISolone (MEDROL DOSEPAK) 4 MG TBPK tablet  Status:  Discontinued        09/01/21 1443    doxycycline (VIBRAMYCIN) 50 MG capsule  2 times daily        09/01/21 1444    methylPREDNISolone (MEDROL DOSEPAK) 4 MG TBPK tablet        09/01/21 1444           Note:  This document was prepared using Dragon voice recognition software and may include unintentional dictation errors.   Naaman Plummer, MD 09/01/21 [phone]
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
## 2021-09-01 NOTE — ED Notes (Signed)
Pending EMS transport

## 2021-09-02 DIAGNOSIS — I959 Hypotension, unspecified: Secondary | ICD-10-CM | POA: Diagnosis not present

## 2021-09-02 DIAGNOSIS — I4891 Unspecified atrial fibrillation: Secondary | ICD-10-CM | POA: Diagnosis not present

## 2021-09-02 DIAGNOSIS — J80 Acute respiratory distress syndrome: Secondary | ICD-10-CM | POA: Diagnosis not present

## 2021-09-05 DIAGNOSIS — I10 Essential (primary) hypertension: Secondary | ICD-10-CM | POA: Diagnosis not present

## 2021-09-05 DIAGNOSIS — R530 Neoplastic (malignant) related fatigue: Secondary | ICD-10-CM | POA: Diagnosis not present

## 2021-09-16 DIAGNOSIS — R5383 Other fatigue: Secondary | ICD-10-CM | POA: Diagnosis not present

## 2021-09-16 DIAGNOSIS — I110 Hypertensive heart disease with heart failure: Secondary | ICD-10-CM | POA: Diagnosis not present

## 2021-09-16 DIAGNOSIS — L03115 Cellulitis of right lower limb: Secondary | ICD-10-CM | POA: Diagnosis not present

## 2021-09-19 ENCOUNTER — Ambulatory Visit: Payer: Medicare Other | Attending: Family | Admitting: Family

## 2021-09-19 ENCOUNTER — Encounter: Payer: Self-pay | Admitting: Family

## 2021-09-19 VITALS — BP 123/61 | HR 77 | Resp 16 | Ht 64.0 in

## 2021-09-19 DIAGNOSIS — I110 Hypertensive heart disease with heart failure: Secondary | ICD-10-CM | POA: Insufficient documentation

## 2021-09-19 DIAGNOSIS — R531 Weakness: Secondary | ICD-10-CM | POA: Insufficient documentation

## 2021-09-19 DIAGNOSIS — I5032 Chronic diastolic (congestive) heart failure: Secondary | ICD-10-CM | POA: Insufficient documentation

## 2021-09-19 DIAGNOSIS — F0390 Unspecified dementia without behavioral disturbance: Secondary | ICD-10-CM | POA: Insufficient documentation

## 2021-09-19 DIAGNOSIS — I4891 Unspecified atrial fibrillation: Secondary | ICD-10-CM | POA: Diagnosis not present

## 2021-09-19 DIAGNOSIS — J449 Chronic obstructive pulmonary disease, unspecified: Secondary | ICD-10-CM | POA: Diagnosis not present

## 2021-09-19 DIAGNOSIS — I890 Lymphedema, not elsewhere classified: Secondary | ICD-10-CM | POA: Diagnosis not present

## 2021-09-19 DIAGNOSIS — I482 Chronic atrial fibrillation, unspecified: Secondary | ICD-10-CM | POA: Diagnosis not present

## 2021-09-19 DIAGNOSIS — Z7901 Long term (current) use of anticoagulants: Secondary | ICD-10-CM | POA: Diagnosis not present

## 2021-09-19 DIAGNOSIS — G4730 Sleep apnea, unspecified: Secondary | ICD-10-CM | POA: Diagnosis not present

## 2021-09-19 DIAGNOSIS — I10 Essential (primary) hypertension: Secondary | ICD-10-CM

## 2021-09-19 DIAGNOSIS — K219 Gastro-esophageal reflux disease without esophagitis: Secondary | ICD-10-CM | POA: Diagnosis not present

## 2021-09-19 DIAGNOSIS — I739 Peripheral vascular disease, unspecified: Secondary | ICD-10-CM | POA: Diagnosis not present

## 2021-09-19 NOTE — Patient Instructions (Addendum)
Continue weighing daily and call for an overnight weight gain of 3 pounds or more or a weekly weight gain of more than 5 pounds.  ° ° ° °Call us in the future if you need us for anything °

## 2021-09-19 NOTE — Progress Notes (Signed)
Patient ID: [name], female    DOB: 20-Feb-1931, 88 y.o.   MRN: [id number]  HPI  [name] is a 88 y/o female with a history of atrial fibrillation, asthma, HTN, thyroid disease, dementia, GERD, gout, PVD, sleep apnea and chronic heart failure.   Echo report from 07/07/21 reviewed and showed an EF of 55-60% along with mild MR but no LVH  Was in the ED 09/01/21 due to intermittent chest pain where she was evaluated and released. Admitted 07/27/21 due to fall and bilateral leg cellulitis. Treated with IV antibiotics. Negative for MRSA. Given IV lasix with transition to oral diuretics. PT evaluation done. Discharged after 4 days. Admitted 07/04/21 due to dyspnea, productive cough, nausea and diarrhea. Started on steroids, bronchodilators, IV Lasix.  CTA chest was negative for pulmonary embolism. Initially given IV lasix but then transitioned to oral diuretics. PT evaluation done. Tested covid + and  completed molnupiravir. Discharged after 8 days. Was in the ED 06/30/21 due to AMS where she was evaluated and released.   She presents today for a follow-up visit with a chief complaint of minimal fatigue upon moderate exertion. Describes this as chronic in nature. She has associated cough, shortness of breath, wheezing, pedal edema, dizziness and weakness along with this. She denies any difficulty sleeping, chest pain, palpitations or abdominal distention.   She says that she will be getting her legs wrapped hopefully next week.   Past Medical History:  Diagnosis Date   Arrhythmia    atrial fibrillation   Arthritis    Asthma    CHF (congestive heart failure) (HCC)    COPD (chronic obstructive pulmonary disease) (HCC)    Cough    Dementia (HCC)    GERD (gastroesophageal reflux disease)    Gout    Hiatal hernia    Hypertension    Hypothyroidism    MRSA (methicillin resistant staph aureus) culture positive    Neuropathic pain of right lower extremity    Peripheral vascular disease ([place])    "poor  circulation"   Seasonal allergies    Shortness of breath dyspnea    Sleep apnea    uses CPAP (sometimes)   Past Surgical History:  Procedure Laterality Date   ANKLE ARTHROSCOPY     BACK SURGERY     L4-L5 Decompression   CARDIAC CATHETERIZATION  1995   CATARACT EXTRACTION W/PHACO Right 07/19/2014   Procedure: CATARACT EXTRACTION PHACO AND INTRAOCULAR LENS PLACEMENT ([place]);  Surgeon: Leandrew Koyanagi, MD;  Location: [facility];  Service: Ophthalmology;  Laterality: Right;   CATARACT EXTRACTION W/PHACO Left 08/23/2014   Procedure: CATARACT EXTRACTION PHACO AND INTRAOCULAR LENS PLACEMENT ([facility]);  Surgeon: Leandrew Koyanagi, MD;  Location: [facility];  Service: Ophthalmology;  Laterality: Left;   CHOLECYSTECTOMY     COLONOSCOPY     PARATHYROIDECTOMY  2003   TONSILLECTOMY     UVULOPALATOPHARYNGOPLASTY     Family History  Problem Relation Age of Onset   Cancer Mother    Hyperlipidemia Son    Diabetes Son    Cancer Maternal Grandmother    Social History   Tobacco Use   Smoking status: Never    Passive exposure: Yes   Smokeless tobacco: Never  Substance Use Topics   Alcohol use: No   Allergies  Allergen Reactions   Prednisone Other (See Comments)    Irritability   Aspirin Nausea Only   Sulfa Antibiotics Rash   Sulfacetamide Sodium Rash   Prior to Admission medications   Medication  Sig Start Date End Date Taking? Authorizing Provider  acetaminophen (TYLENOL) 500 MG tablet Take 500-1,000 mg by mouth every 6 (six) hours as needed for mild pain or moderate pain.   Yes [provider]  albuterol (VENTOLIN HFA) 108 (90 Base) MCG/ACT inhaler Inhale 2 puffs into the lungs every 6 (six) hours as needed for wheezing or shortness of breath.   Yes [provider]  apixaban (ELIQUIS) 5 MG TABS tablet Take 5 mg by mouth 2 (two) times daily.   Yes [provider]  benzonatate (TESSALON) 100 MG capsule Take 100 mg by mouth 3 (three) times daily  as needed for cough.   Yes [provider]  DULoxetine (CYMBALTA) 60 MG capsule Take 60 mg by mouth daily. 05/04/20  Yes [provider]  famotidine (PEPCID) 20 MG tablet Take 20 mg by mouth at bedtime.   Yes [provider]  furosemide (LASIX) 20 MG tablet Take 20 mg by mouth 2 (two) times daily. 04/04/21  Yes [provider]  guaiFENesin-dextromethorphan (ROBITUSSIN DM) 100-10 MG/5ML syrup Take 5 mLs by mouth every 6 (six) hours as needed for cough.   Yes [provider]  hydrOXYzine (ATARAX) 10 MG tablet Take 10 mg by mouth 2 (two) times daily. 04/01/21  Yes [provider]  ipratropium (ATROVENT) 0.02 % nebulizer solution Take 0.5 mg by nebulization every 4 (four) hours as needed for wheezing or shortness of breath.   Yes [provider]  levothyroxine (SYNTHROID) 150 MCG tablet Take 150 mcg by mouth daily. 04/01/21  Yes [provider]  loratadine (CLARITIN) 10 MG tablet Take 10 mg by mouth daily.   Yes [provider]  metoprolol succinate (TOPROL-XL) 25 MG 24 hr tablet Take 0.5 tablets (12.5 mg total) by mouth at bedtime. 07/10/20  Yes Wieting, Richard, MD  montelukast (SINGULAIR) 10 MG tablet Take 10 mg by mouth at bedtime.   Yes [provider]  Multiple Vitamins-Minerals (PRESERVISION AREDS 2+MULTI VIT) CAPS Take 1 capsule by mouth daily.   Yes [provider]  nystatin cream (MYCOSTATIN) Apply 1 application. topically 3 (three) times daily. (Apply under the breasts)   Yes [provider]  ondansetron (ZOFRAN-ODT) 8 MG disintegrating tablet Take 8 mg by mouth every 8 (eight) hours as needed for nausea or vomiting.   Yes [provider]  triamcinolone cream (KENALOG) 0.1 % Apply 1 application. topically 2 (two) times daily.   Yes [provider]  vitamin B-12 (CYANOCOBALAMIN) 500 MCG tablet Take 500 mcg by mouth daily.   Yes [provider]  Vitamin D,  Ergocalciferol, (DRISDOL) 1.25 MG (50000 UNIT) CAPS capsule Take 50,000 Units by mouth every Friday.    [provider]    Review of Systems  Constitutional:  Positive for fatigue. Negative for appetite change.  HENT:  Negative for congestion, postnasal drip and sore throat.   Eyes: Negative.   Respiratory:  Positive for cough (dry), shortness of breath and wheezing. Negative for chest tightness.   Cardiovascular:  Positive for leg swelling. Negative for chest pain and palpitations.  Gastrointestinal:  Negative for abdominal distention and abdominal pain.  Endocrine: Negative.   Genitourinary: Negative.   Musculoskeletal:  Negative for back pain and neck pain.  Skin: Negative.   Allergic/Immunologic: Negative.   Neurological:  Positive for dizziness and weakness. Negative for light-headedness.  Hematological:  Negative for adenopathy. Does not bruise/bleed easily.  Psychiatric/Behavioral:  Negative for dysphoric mood and sleep disturbance. The patient is not  nervous/anxious.    Vitals:   09/19/21 1403  BP: 123/61  Pulse: 77  Resp: 16  SpO2: 98%  Height: '5\' 4"'$  (1.626 m)   Wt Readings from Last 3 Encounters:  07/31/21 257 lb 0.9 oz (116.6 kg)  07/12/21 257 lb 4.8 oz (116.7 kg)  06/30/21 285 lb 7.9 oz (129.5 kg)   Lab Results  Component Value Date   CREATININE 0.96 09/01/2021   CREATININE 0.84 07/30/2021   CREATININE 1.09 (H) 07/29/2021   Physical Exam Vitals and nursing note reviewed. Exam conducted with a chaperone present (daughter).  Constitutional:      Appearance: Normal appearance.  HENT:     Head: Normocephalic and atraumatic.  Cardiovascular:     Rate and Rhythm: Normal rate. Rhythm irregular.  Pulmonary:     Effort: Pulmonary effort is normal.     Breath sounds: No wheezing, rhonchi or rales.  Abdominal:     General: There is no distension.     Palpations: Abdomen is soft.  Musculoskeletal:        General: No tenderness.     Cervical back: Normal  range of motion and neck supple.     Right lower leg: Edema (2+ pitting) present.     Left lower leg: Edema (2+ pitting) present.  Skin:    General: Skin is warm and dry.  Neurological:     General: No focal deficit present.     Mental Status: She is alert and oriented to person, place, and time.  Psychiatric:        Mood and Affect: Mood normal.        Behavior: Behavior normal.   Assessment & Plan:  1: Chronic heart failure with preserved ejection fraction without structural changes- - NYHA class II - euvolemic today - being weighed most days of the week; reminded to call for an overnight weight gain of > 2 pounds or a weekly weight gain of > 5 pounds - due to weakness, she was unable to stand in clinic to get a weight - not adding any salt to her food although she says that sometimes her food does taste salty to her - drinking water and some tea during the day - BNP 07/27/21 was 135.2  2: HTN- - BP looks good (123/61) - saw PCP (Gauger) 05/24/21; now seeing [facility] facility - [facility] 09/01/21 reviewed and showed sodium 140, potassium 3.7, creatinine 0.96 & GFR 57  3: COPD- - saw pulmonology Raul Del) 06/19/21  4: Atrial fibrillation-  - saw cardiology Corky Sox) 06/24/21 - currently taking apixaban and metoprolol  5: Lymphedema- - stage 2 - does improve some with elevation of her legs - limited in her ability to exercise due to weakness - patient says that she will be getting her legs wrapped hopefully next week - daughter says that she is now being followed by hospice and will be re-evaluated in 3 months   Facility medication list reviewed.   Due to HF stability will not make a return appointment at this time. Advised patient and her daughter to follow closely with her other providers but that they could call back at anytime for questions or to make another appointment. They were comfortable with this plan.

## 2021-09-27 ENCOUNTER — Ambulatory Visit (INDEPENDENT_AMBULATORY_CARE_PROVIDER_SITE_OTHER): Admitting: Nurse Practitioner

## 2021-09-27 ENCOUNTER — Encounter (INDEPENDENT_AMBULATORY_CARE_PROVIDER_SITE_OTHER): Payer: Self-pay | Admitting: Nurse Practitioner

## 2021-09-27 DIAGNOSIS — G6289 Other specified polyneuropathies: Secondary | ICD-10-CM

## 2021-09-27 DIAGNOSIS — I10 Essential (primary) hypertension: Secondary | ICD-10-CM | POA: Diagnosis not present

## 2021-09-27 DIAGNOSIS — I890 Lymphedema, not elsewhere classified: Secondary | ICD-10-CM

## 2021-09-28 ENCOUNTER — Encounter (INDEPENDENT_AMBULATORY_CARE_PROVIDER_SITE_OTHER): Payer: Self-pay | Admitting: Nurse Practitioner

## 2021-09-28 NOTE — Progress Notes (Signed)
Subjective:    Patient ID: [name], female    DOB: 07-13-1930, 88 y.o.   MRN: [id number] No chief complaint on file.   The patient returns today for follow-up evaluation for lower extremity edema.  She was previously doing well at her last follow-up visit.  However since her most recent visit she has had a significant decline in her mobility.  She has had approximately 7 falls in the last few weeks.  Because of this she is not very active anymore.  This is also led to an increase in worsening of her swelling.  She denies any open wounds or ulcerations but there have been a few open blisters.  She does have some worsening skin changes as well.  There is no evidence of cellulitis.  She does note some increasing pain in her toes.  She notes that they turn different colors sometimes they are red sometimes or blue.  She notes sharp shooting pains consistent with her neuropathy.  She also continues to have significant arthritis in sometimes is a poor historian and hard to pinpoint her exact area of pain.    Review of Systems  Cardiovascular:  Positive for leg swelling.  Musculoskeletal:  Positive for arthralgias.  Neurological:  Positive for weakness.  All other systems reviewed and are negative.      Objective:   Physical Exam Vitals reviewed.  HENT:     Head: Normocephalic.  Cardiovascular:     Rate and Rhythm: Normal rate.     Comments: Nonpalpable pedal pulses Pulmonary:     Effort: Pulmonary effort is normal.  Skin:    General: Skin is warm and dry.  Neurological:     Mental Status: She is alert and oriented to person, place, and time.     Motor: Weakness present.     Gait: Gait abnormal.  Psychiatric:        Mood and Affect: Mood normal.        Behavior: Behavior normal.        Thought Content: Thought content normal.        Judgment: Judgment normal.     There were no vitals taken for this visit.  Past Medical History:  Diagnosis Date   Arrhythmia    atrial  fibrillation   Arthritis    Asthma    CHF (congestive heart failure) (HCC)    COPD (chronic obstructive pulmonary disease) (HCC)    Cough    Dementia (HCC)    GERD (gastroesophageal reflux disease)    Gout    Hiatal hernia    Hypertension    Hypothyroidism    MRSA (methicillin resistant staph aureus) culture positive    Neuropathic pain of right lower extremity    Peripheral vascular disease ([place])    "poor circulation"   Seasonal allergies    Shortness of breath dyspnea    Sleep apnea    uses CPAP (sometimes)    Social History   Socioeconomic History   Marital status: Widowed    Spouse name: Not on file   Number of children: Not on file   Years of education: Not on file   Highest education level: Not on file  Occupational History   Not on file  Tobacco Use   Smoking status: Never    Passive exposure: Yes   Smokeless tobacco: Never  Vaping Use   Vaping Use: Never used  Substance and Sexual Activity   Alcohol use: No   Drug use: No  Sexual activity: Not Currently  Other Topics Concern   Not on file  Social History Narrative   Not on file   Social Determinants of Health   Financial Resource Strain: Not on file  Food Insecurity: Not on file  Transportation Needs: No Transportation Needs (03/28/2021)   PRAPARE - Transportation    Lack of Transportation (Medical): No    Lack of Transportation (Non-Medical): No  Physical Activity: Not on file  Stress: Not on file  Social Connections: Not on file  Intimate Partner Violence: Not on file    Past Surgical History:  Procedure Laterality Date   ANKLE ARTHROSCOPY     BACK SURGERY     L4-L5 Decompression   CARDIAC CATHETERIZATION  1995   CATARACT EXTRACTION W/PHACO Right 07/19/2014   Procedure: CATARACT EXTRACTION PHACO AND INTRAOCULAR LENS PLACEMENT ([facility]);  Surgeon: Leandrew Koyanagi, MD;  Location: [place];  Service: Ophthalmology;  Laterality: Right;   CATARACT EXTRACTION W/PHACO Left 08/23/2014    Procedure: CATARACT EXTRACTION PHACO AND INTRAOCULAR LENS PLACEMENT ([facility]);  Surgeon: Leandrew Koyanagi, MD;  Location: [place];  Service: Ophthalmology;  Laterality: Left;   CHOLECYSTECTOMY     COLONOSCOPY     PARATHYROIDECTOMY  2003   TONSILLECTOMY     UVULOPALATOPHARYNGOPLASTY      Family History  Problem Relation Age of Onset   Cancer Mother    Hyperlipidemia Son    Diabetes Son    Cancer Maternal Grandmother     Allergies  Allergen Reactions   Prednisone Other (See Comments)    Irritability   Aspirin Nausea Only   Sulfa Antibiotics Rash   Sulfacetamide Sodium Rash       Latest Ref Rng & Units 09/01/2021   12:24 PM 07/28/2021    4:48 AM 07/27/2021    1:40 PM  CBC  WBC 4.0 - 10.5 K/uL 6.7  7.7  7.2   Hemoglobin 12.0 - 15.0 g/dL 11.7  11.1  11.4   Hematocrit 36.0 - 46.0 % 38.2  35.9  38.0   Platelets 150 - 400 K/uL 252  279  291       CMP     Component Value Date/Time   NA 140 09/01/2021 1224   NA 139 01/19/2013 0313   K 3.7 09/01/2021 1224   K 3.6 01/19/2013 0313   CL 105 09/01/2021 1224   CL 107 01/19/2013 0313   CO2 32 09/01/2021 1224   CO2 28 01/19/2013 0313   GLUCOSE 106 (H) 09/01/2021 1224   GLUCOSE 107 (H) 01/19/2013 0313   BUN 17 09/01/2021 1224   BUN 20 (H) 01/19/2013 0313   CREATININE 0.96 09/01/2021 1224   CREATININE 1.00 01/19/2013 0313   CALCIUM 10.2 09/01/2021 1224   CALCIUM 9.8 01/19/2013 0313   PROT 7.1 07/27/2021 1340   PROT 7.0 01/19/2013 0313   ALBUMIN 3.2 (L) 07/27/2021 1340   ALBUMIN 3.3 (L) 01/19/2013 0313   AST 20 07/27/2021 1340   AST 18 01/19/2013 0313   ALT 13 07/27/2021 1340   ALT 13 01/19/2013 0313   ALKPHOS 81 07/27/2021 1340   ALKPHOS 76 01/19/2013 0313   BILITOT 0.6 07/27/2021 1340   BILITOT 0.2 01/19/2013 0313   GFRNONAA 57 (L) 09/01/2021 1224   GFRNONAA 53 (L) 01/19/2013 0313   GFRAA 56 (L) 09/08/2019 1626   GFRAA >60 01/19/2013 0313     No results found.     Assessment & Plan:   1. Lymphedema  of both lower extremities The patient's  lower extremity edema is worse and therefore we will place her in [place] boots.  The patient recently notes that she has been having hospice care.  We will work with her assisted living facility and palliative care to try to have her wraps done at her facility.  2. Essential hypertension Continue antihypertensive medications as already ordered, these medications have been reviewed and there are no changes at this time.   3. Other polyneuropathy I suspect that the discoloration and pain the patient is experiencing are related to the underlying venous insufficiency and her neuropathy.  However when she returns we will have the patient undergo ABIs to ensure there is no arterial component.   Current Outpatient Medications on File Prior to Visit  Medication Sig Dispense Refill   acetaminophen (TYLENOL) 500 MG tablet Take 500-1,000 mg by mouth every 6 (six) hours as needed for mild pain or moderate pain.     albuterol (VENTOLIN HFA) 108 (90 Base) MCG/ACT inhaler Inhale 2 puffs into the lungs every 6 (six) hours as needed for wheezing or shortness of breath.  12   apixaban (ELIQUIS) 5 MG TABS tablet Take 5 mg by mouth 2 (two) times daily.     benzonatate (TESSALON) 100 MG capsule Take 100 mg by mouth 3 (three) times daily as needed for cough.     DULoxetine (CYMBALTA) 60 MG capsule Take 60 mg by mouth daily.     famotidine (PEPCID) 20 MG tablet Take 20 mg by mouth at bedtime.     furosemide (LASIX) 20 MG tablet Take 20 mg by mouth 2 (two) times daily.     hydrOXYzine (ATARAX) 10 MG tablet Take 10 mg by mouth 2 (two) times daily.     levothyroxine (SYNTHROID) 150 MCG tablet Take 150 mcg by mouth daily.     loratadine (CLARITIN) 10 MG tablet Take 10 mg by mouth daily.     metoprolol succinate (TOPROL-XL) 25 MG 24 hr tablet Take 0.5 tablets (12.5 mg total) by mouth at bedtime. 15 tablet 0   montelukast (SINGULAIR) 10 MG tablet Take 10 mg by mouth at bedtime.      Multiple Vitamins-Minerals (PRESERVISION AREDS 2+MULTI VIT) CAPS Take 1 capsule by mouth daily.     nystatin cream (MYCOSTATIN) Apply 1 application. topically 3 (three) times daily. (Apply under the breasts)     triamcinolone cream (KENALOG) 0.1 % Apply 1 application. topically 2 (two) times daily.     vitamin B-12 (CYANOCOBALAMIN) 500 MCG tablet Take 500 mcg by mouth daily.     No current facility-administered medications on file prior to visit.    There are no Patient Instructions on file for this visit. No follow-ups on file.   Kris Hartmann, NP

## 2021-10-31 ENCOUNTER — Other Ambulatory Visit (INDEPENDENT_AMBULATORY_CARE_PROVIDER_SITE_OTHER): Payer: Self-pay | Admitting: Nurse Practitioner

## 2021-10-31 DIAGNOSIS — G6289 Other specified polyneuropathies: Secondary | ICD-10-CM

## 2021-10-31 DIAGNOSIS — M79606 Pain in leg, unspecified: Secondary | ICD-10-CM

## 2021-11-01 ENCOUNTER — Observation Stay
Admission: EM | Admit: 2021-11-01 | Discharge: 2021-11-03 | Disposition: A | Discharge status: Home / Self Care | Attending: Internal Medicine | Admitting: Internal Medicine

## 2021-11-01 ENCOUNTER — Other Ambulatory Visit: Payer: Self-pay

## 2021-11-01 ENCOUNTER — Emergency Department

## 2021-11-01 DIAGNOSIS — F0390 Unspecified dementia without behavioral disturbance: Secondary | ICD-10-CM | POA: Insufficient documentation

## 2021-11-01 DIAGNOSIS — R4182 Altered mental status, unspecified: Secondary | ICD-10-CM | POA: Diagnosis not present

## 2021-11-01 DIAGNOSIS — Z7901 Long term (current) use of anticoagulants: Secondary | ICD-10-CM | POA: Insufficient documentation

## 2021-11-01 DIAGNOSIS — N1831 Chronic kidney disease, stage 3a: Secondary | ICD-10-CM | POA: Diagnosis present

## 2021-11-01 DIAGNOSIS — R400 Somnolence: Secondary | ICD-10-CM | POA: Diagnosis not present

## 2021-11-01 DIAGNOSIS — R410 Disorientation, unspecified: Secondary | ICD-10-CM

## 2021-11-01 DIAGNOSIS — Z20822 Contact with and (suspected) exposure to covid-19: Secondary | ICD-10-CM | POA: Insufficient documentation

## 2021-11-01 DIAGNOSIS — Z79899 Other long term (current) drug therapy: Secondary | ICD-10-CM | POA: Insufficient documentation

## 2021-11-01 DIAGNOSIS — I10 Essential (primary) hypertension: Secondary | ICD-10-CM | POA: Diagnosis present

## 2021-11-01 DIAGNOSIS — J449 Chronic obstructive pulmonary disease, unspecified: Secondary | ICD-10-CM | POA: Diagnosis not present

## 2021-11-01 DIAGNOSIS — R531 Weakness: Secondary | ICD-10-CM

## 2021-11-01 DIAGNOSIS — E213 Hyperparathyroidism, unspecified: Secondary | ICD-10-CM | POA: Diagnosis present

## 2021-11-01 DIAGNOSIS — I130 Hypertensive heart and chronic kidney disease with heart failure and stage 1 through stage 4 chronic kidney disease, or unspecified chronic kidney disease: Secondary | ICD-10-CM | POA: Insufficient documentation

## 2021-11-01 DIAGNOSIS — I482 Chronic atrial fibrillation, unspecified: Secondary | ICD-10-CM | POA: Diagnosis not present

## 2021-11-01 DIAGNOSIS — Z6841 Body Mass Index (BMI) 40.0 and over, adult: Secondary | ICD-10-CM | POA: Diagnosis not present

## 2021-11-01 DIAGNOSIS — J45909 Unspecified asthma, uncomplicated: Secondary | ICD-10-CM | POA: Diagnosis not present

## 2021-11-01 DIAGNOSIS — I5032 Chronic diastolic (congestive) heart failure: Secondary | ICD-10-CM | POA: Diagnosis present

## 2021-11-01 DIAGNOSIS — E039 Hypothyroidism, unspecified: Secondary | ICD-10-CM | POA: Diagnosis not present

## 2021-11-01 DIAGNOSIS — R748 Abnormal levels of other serum enzymes: Secondary | ICD-10-CM | POA: Diagnosis present

## 2021-11-01 DIAGNOSIS — G9341 Metabolic encephalopathy: Secondary | ICD-10-CM | POA: Diagnosis not present

## 2021-11-01 DIAGNOSIS — G4733 Obstructive sleep apnea (adult) (pediatric): Secondary | ICD-10-CM | POA: Diagnosis present

## 2021-11-01 DIAGNOSIS — F418 Other specified anxiety disorders: Secondary | ICD-10-CM | POA: Diagnosis present

## 2021-11-01 LAB — URINALYSIS, ROUTINE W REFLEX MICROSCOPIC
Bilirubin Urine: NEGATIVE
Glucose, UA: NEGATIVE mg/dL
Hgb urine dipstick: NEGATIVE
Ketones, ur: 5 mg/dL — AB
Leukocytes,Ua: NEGATIVE
Nitrite: NEGATIVE
Protein, ur: NEGATIVE mg/dL
Specific Gravity, Urine: 1.01 (ref 1.005–1.030)
pH: 7 (ref 5.0–8.0)

## 2021-11-01 LAB — COMPREHENSIVE METABOLIC PANEL
ALT: 13 U/L (ref 0–44)
AST: 29 U/L (ref 15–41)
Albumin: 3.7 g/dL (ref 3.5–5.0)
Alkaline Phosphatase: 74 U/L (ref 38–126)
Anion gap: 8 (ref 5–15)
BUN: 25 mg/dL — ABNORMAL HIGH (ref 8–23)
CO2: 27 mmol/L (ref 22–32)
Calcium: 10.4 mg/dL — ABNORMAL HIGH (ref 8.9–10.3)
Chloride: 103 mmol/L (ref 98–111)
Creatinine, Ser: 0.99 mg/dL (ref 0.44–1.00)
GFR, Estimated: 55 mL/min — ABNORMAL LOW (ref >60)
Glucose, Bld: 128 mg/dL — ABNORMAL HIGH (ref 70–99)
Potassium: 3.8 mmol/L (ref 3.5–5.1)
Sodium: 138 mmol/L (ref 135–145)
Total Bilirubin: 0.9 mg/dL (ref 0.3–1.2)
Total Protein: 7.1 g/dL (ref 6.5–8.1)

## 2021-11-01 LAB — BLOOD GAS, VENOUS
Acid-Base Excess: 5.2 mmol/L — ABNORMAL HIGH (ref 0.0–2.0)
Bicarbonate: 31.5 mmol/L — ABNORMAL HIGH (ref 20.0–28.0)
O2 Saturation: 55 %
Patient temperature: 37
pCO2, Ven: 52 mmHg (ref 44–60)
pH, Ven: 7.39 (ref 7.25–7.43)
pO2, Ven: 37 mmHg (ref 32–45)

## 2021-11-01 LAB — RESP PANEL BY RT-PCR (FLU A&B, COVID) ARPGX2
Influenza A by PCR: NEGATIVE
Influenza B by PCR: NEGATIVE
SARS Coronavirus 2 by RT PCR: NEGATIVE

## 2021-11-01 LAB — CBC
HCT: 36.7 % (ref 36.0–46.0)
Hemoglobin: 11.6 g/dL — ABNORMAL LOW (ref 12.0–15.0)
MCH: 27 pg (ref 26.0–34.0)
MCHC: 31.6 g/dL (ref 30.0–36.0)
MCV: 85.5 fL (ref 80.0–100.0)
Platelets: 256 10*3/uL (ref 150–400)
RBC: 4.29 MIL/uL (ref 3.87–5.11)
RDW: 15.6 % — ABNORMAL HIGH (ref 11.5–15.5)
WBC: 10.5 10*3/uL (ref 4.0–10.5)
nRBC: 0 % (ref 0.0–0.2)

## 2021-11-01 LAB — CK: Total CK: 408 U/L — ABNORMAL HIGH (ref 38–234)

## 2021-11-01 LAB — CBG MONITORING, ED: Glucose-Capillary: 106 mg/dL — ABNORMAL HIGH (ref 70–99)

## 2021-11-01 LAB — LACTIC ACID, PLASMA
Lactic Acid, Venous: 1 mmol/L (ref 0.5–1.9)
Lactic Acid, Venous: 1.3 mmol/L (ref 0.5–1.9)

## 2021-11-01 LAB — MRSA NEXT GEN BY PCR, NASAL: MRSA by PCR Next Gen: NOT DETECTED

## 2021-11-01 LAB — TSH: TSH: 4.569 u[IU]/mL — ABNORMAL HIGH (ref 0.350–4.500)

## 2021-11-01 LAB — AMMONIA: Ammonia: 13 umol/L (ref 9–35)

## 2021-11-01 LAB — BRAIN NATRIURETIC PEPTIDE: B Natriuretic Peptide: 144.5 pg/mL — ABNORMAL HIGH (ref 0.0–100.0)

## 2021-11-01 LAB — PROCALCITONIN: Procalcitonin: 0.18 ng/mL

## 2021-11-01 MED ORDER — FUROSEMIDE 20 MG PO TABS
20.0000 mg | ORAL_TABLET | Freq: Two times a day (BID) | ORAL | Status: DC
  Administered 2021-11-02 – 2021-11-03 (×3): 20 mg via ORAL
  Filled 2021-11-01 (×3): qty 1

## 2021-11-01 MED ORDER — LEVOTHYROXINE SODIUM 50 MCG PO TABS
150.0000 ug | ORAL_TABLET | Freq: Every day | ORAL | Status: DC
  Administered 2021-11-02 – 2021-11-03 (×2): 150 ug via ORAL
  Filled 2021-11-01 (×2): qty 1

## 2021-11-01 MED ORDER — METOPROLOL SUCCINATE ER 25 MG PO TB24
12.5000 mg | ORAL_TABLET | Freq: Every day | ORAL | Status: DC
  Administered 2021-11-02: 12.5 mg via ORAL
  Filled 2021-11-01: qty 1

## 2021-11-01 MED ORDER — NYSTATIN 100000 UNIT/GM EX CREA
1.0000 | TOPICAL_CREAM | Freq: Three times a day (TID) | CUTANEOUS | Status: DC
Start: 2021-11-01 — End: 2021-11-03
  Administered 2021-11-02 – 2021-11-03 (×4): 1 via TOPICAL
  Filled 2021-11-01 (×2): qty 30

## 2021-11-01 MED ORDER — APIXABAN 5 MG PO TABS
5.0000 mg | ORAL_TABLET | Freq: Two times a day (BID) | ORAL | Status: DC
  Administered 2021-11-01 – 2021-11-03 (×4): 5 mg via ORAL
  Filled 2021-11-01 (×4): qty 1

## 2021-11-01 MED ORDER — DULOXETINE HCL 30 MG PO CPEP
60.0000 mg | ORAL_CAPSULE | Freq: Every day | ORAL | Status: DC
  Administered 2021-11-02 – 2021-11-03 (×2): 60 mg via ORAL
  Filled 2021-11-01 (×2): qty 2

## 2021-11-01 MED ORDER — OXYCODONE-ACETAMINOPHEN 5-325 MG PO TABS
1.0000 | ORAL_TABLET | Freq: Four times a day (QID) | ORAL | Status: DC | PRN
  Administered 2021-11-01: 1 via ORAL
  Filled 2021-11-01: qty 1

## 2021-11-01 MED ORDER — ONDANSETRON HCL 4 MG/2ML IJ SOLN: 4.0000 mg | Freq: Three times a day (TID) | INTRAMUSCULAR | Status: DC | PRN

## 2021-11-01 MED ORDER — HYDRALAZINE HCL 20 MG/ML IJ SOLN
5.0000 mg | INTRAMUSCULAR | Status: DC | PRN
Start: 2021-11-01 — End: 2021-11-03
  Administered 2021-11-01: 5 mg via INTRAVENOUS
  Filled 2021-11-01: qty 1

## 2021-11-01 MED ORDER — IPRATROPIUM BROMIDE 0.02 % IN SOLN: 0.5000 mg | Freq: Four times a day (QID) | RESPIRATORY_TRACT | Status: DC | PRN

## 2021-11-01 MED ORDER — ALBUTEROL SULFATE (2.5 MG/3ML) 0.083% IN NEBU
2.5000 mg | INHALATION_SOLUTION | RESPIRATORY_TRACT | Status: DC | PRN
  Administered 2021-11-02 – 2021-11-03 (×2): 2.5 mg via RESPIRATORY_TRACT
  Filled 2021-11-01 (×2): qty 3

## 2021-11-01 MED ORDER — MONTELUKAST SODIUM 10 MG PO TABS
10.0000 mg | ORAL_TABLET | Freq: Every day | ORAL | Status: DC
  Administered 2021-11-01 – 2021-11-02 (×2): 10 mg via ORAL
  Filled 2021-11-01 (×2): qty 1

## 2021-11-01 MED ORDER — FAMOTIDINE 20 MG PO TABS
20.0000 mg | ORAL_TABLET | Freq: Every day | ORAL | Status: DC
  Administered 2021-11-01 – 2021-11-02 (×2): 20 mg via ORAL
  Filled 2021-11-01 (×2): qty 1

## 2021-11-01 MED ORDER — IPRATROPIUM-ALBUTEROL 0.5-2.5 (3) MG/3ML IN SOLN
3.0000 mL | Freq: Once | RESPIRATORY_TRACT | Status: AC
  Administered 2021-11-01: 3 mL via RESPIRATORY_TRACT
  Filled 2021-11-01: qty 3

## 2021-11-01 MED ORDER — TRIAMCINOLONE ACETONIDE 0.1 % EX CREA
1.0000 | TOPICAL_CREAM | Freq: Two times a day (BID) | CUTANEOUS | Status: DC
Start: 2021-11-01 — End: 2021-11-03
  Administered 2021-11-02 – 2021-11-03 (×3): 1 via TOPICAL
  Filled 2021-11-01 (×2): qty 15

## 2021-11-01 MED ORDER — INFLUENZA VAC A&B SA ADJ QUAD 0.5 ML IM PRSY
0.5000 mL | PREFILLED_SYRINGE | INTRAMUSCULAR | Status: DC
  Filled 2021-11-01: qty 0.5

## 2021-11-01 MED ORDER — ACETAMINOPHEN 325 MG PO TABS
650.0000 mg | ORAL_TABLET | Freq: Four times a day (QID) | ORAL | Status: DC | PRN
  Administered 2021-11-01 – 2021-11-03 (×3): 650 mg via ORAL
  Filled 2021-11-01 (×3): qty 2

## 2021-11-01 MED ORDER — LORATADINE 10 MG PO TABS
10.0000 mg | ORAL_TABLET | Freq: Every day | ORAL | Status: DC
  Administered 2021-11-02 – 2021-11-03 (×2): 10 mg via ORAL
  Filled 2021-11-01 (×2): qty 1

## 2021-11-01 MED ORDER — DM-GUAIFENESIN ER 30-600 MG PO TB12: 1.0000 | ORAL_TABLET | Freq: Two times a day (BID) | ORAL | Status: DC | PRN

## 2021-11-01 MED ORDER — PNEUMOCOCCAL 20-VAL CONJ VACC 0.5 ML IM SUSY
0.5000 mL | PREFILLED_SYRINGE | INTRAMUSCULAR | Status: DC
  Filled 2021-11-01: qty 0.5

## 2021-11-01 MED ORDER — CYANOCOBALAMIN 500 MCG PO TABS
500.0000 ug | ORAL_TABLET | Freq: Every day | ORAL | Status: DC
  Administered 2021-11-02 – 2021-11-03 (×2): 500 ug via ORAL
  Filled 2021-11-01 (×2): qty 1

## 2021-11-01 MED ORDER — LIDOCAINE 5 % EX PTCH
1.0000 | MEDICATED_PATCH | CUTANEOUS | Status: DC
Start: 2021-11-02 — End: 2021-11-03
  Administered 2021-11-03: 1 via TRANSDERMAL
  Filled 2021-11-01 (×2): qty 1

## 2021-11-01 MED ORDER — PRESERVISION AREDS 2+MULTI VIT PO CAPS: 1.0000 | ORAL_CAPSULE | Freq: Every day | ORAL | Status: DC

## 2021-11-01 MED ORDER — OXYCODONE-ACETAMINOPHEN 5-325 MG PO TABS: 1.0000 | ORAL_TABLET | ORAL | Status: DC | PRN

## 2021-11-01 MED ORDER — OCUVITE-LUTEIN PO CAPS
1.0000 | ORAL_CAPSULE | Freq: Every day | ORAL | Status: DC
  Administered 2021-11-02 – 2021-11-03 (×2): 1 via ORAL
  Filled 2021-11-01 (×3): qty 1

## 2021-11-01 NOTE — Assessment & Plan Note (Signed)
  BMI= 48.92  and BW= 129.3 -Diet and exercise.   -Encourage to lose weight.

## 2021-11-01 NOTE — Assessment & Plan Note (Signed)
TSH 4.569, slightly elevated. - Continue Synthroid, will not make dose adjustment due to her old age 88

## 2021-11-01 NOTE — Assessment & Plan Note (Signed)
2D echo on 07/07/2021 showed EF of 55 to 60%.  Patient has 1+ leg edema, BNP 144, oxygen saturation 93-95% on room air.  Does not seem to have CHF exacerbation, but obviously at high risk of developing CHF exacerbation. -Continue home Lasix 20 mg twice daily

## 2021-11-01 NOTE — ED Notes (Signed)
Pt's daughter at bedside states that pt does have early dementia and lives in an assisted living setting. Daughter received a call from the facility this morning to inform her that pt was found down on the floor with unknown time down, presenting with confusion. Pt has had multiple severe UTIs since the beginning of the year and just finished a round of abx within the past week for UTI treatment. Since Monday she has been increasingly confused and has been "talking crazy" for several days prior to this morning's events. MD notified and RN awaiting orders.

## 2021-11-01 NOTE — Assessment & Plan Note (Addendum)
Etiology is not clear.  Patient moves all extremities, no facial droop or slurred speech.  Low suspicions for stroke.  CT head negative.  Urinalysis negative.  Patient has  mild erythema in both legs, but per her daughter, this is chronic looking for her legs, no warmth, less likely to have cellulitis.  Differential diagnosis include progression of dementia versus delirium.  -Placed on telemetry bed for observation -Frequent neurochecks -Check vitamin B12 level -Check TSH level --> 4.569 -Bx is ordered by EDP -Hold hydroxyzine

## 2021-11-01 NOTE — Assessment & Plan Note (Signed)
No wheezing or rhonchi on auscultation. -Bronchodilators 

## 2021-11-01 NOTE — Assessment & Plan Note (Addendum)
BP's overall stable --on Metoprolol, Lasix

## 2021-11-01 NOTE — Assessment & Plan Note (Signed)
-  continue Cymbalta -hydroxyzine was held due to altered mental status, okay to resume at discharge

## 2021-11-01 NOTE — Assessment & Plan Note (Signed)
Heart rate 45, 73 -Continue metoprolol, Eliquis

## 2021-11-01 NOTE — Assessment & Plan Note (Signed)
CPAP.  

## 2021-11-01 NOTE — Progress Notes (Signed)
Chaplain visited room to bring AD paperwork per consult. PT daughter who had requested the paperwork had gone for the day, paperwork left with night shift nurse and on call chaplain will follow up in the morning per request.

## 2021-11-01 NOTE — ED Provider Notes (Signed)
[facility] Provider Note    Event Date/Time   First MD Initiated Contact with Patient 11/01/21 1253     (approximate)   History   Altered Mental Status   HPI  [name] is a 88 y.o. female history of previous admission from June 6 for bilateral lower extremity cellulitis history of hypertension hypothyroidism chronic A-fib obesity COPD CHF chronic kidney disease  Family reports that yesterday the patient was a little bit confused, she lives at assisted living.  Then this morning she evidently was found next to her bed, they suspect she may have rolled out of bed during the night.  She is continued to be more confused.  Daughter reports she was recently treated by her doctor for urinary tract infection and has had confusion in the past with urinary infection  Patient alerts to voice, reports no complaints just reports she feels tired.  Denies headache no chest pain.  No neck pain.  No shortness of breath.  Just feels tired.  She is able to communicate and hold conversation, but when not being converse with she closes her eyes and seems to fall back towards sleepiness     Physical Exam   Triage Vital Signs: ED Triage Vitals  Enc Vitals Group     BP 11/01/21 1234 (!) 156/65     Pulse Rate 11/01/21 1234 88     Resp 11/01/21 1234 20     Temp 11/01/21 1240 97.7 F (36.5 C)     Temp Source 11/01/21 1240 Oral     SpO2 11/01/21 1234 95 %     Weight --      Height --      Head Circumference --      Peak Flow --      Pain Score --      Pain Loc --      Pain Edu? --      Excl. in [place]? --     Most recent vital signs: Vitals:   11/01/21 1500 11/01/21 1530  BP: (!) 150/95 (!) 138/95  Pulse: 73 92  Resp: 14 16  Temp:    SpO2: 93% 95%     General: Somnolent.  Awakes easily to voice though.  Able to give her full name tells me you are at the hospital but when asked what the year and date might be she reports she has no idea.  She seems slightly  confused with orientation questions.  Follows commands without difficulty.  Denies being in any pain CV:  Good peripheral perfusion.  Normal heart tones.  Warm well-perfused extremities Resp:  Normal effort when awake, but when she lays back down to rest she has upper respiratory wheezing sound that seems to be indicative of mild upper airway obstruction or possibly sleep apnea.  This sound clears and her lungs appear to be clear on auscultation when the patient is alert and sitting up.  Lung sounds seem somewhat diminished when she is sleeping now Abd:  No distention.  Soft nontender nondistended Other:  By lower extremities with wound dressings, these were taken down and findings of what appears to be lymphedema without evidence of cellulitis or skin breakdown   ED Results / Procedures / Treatments   Labs (all labs ordered are listed, but only abnormal results are displayed) Labs Reviewed  COMPREHENSIVE METABOLIC PANEL - Abnormal; Notable for the following components:      Result Value   Glucose, Bld 128 (*)  BUN 25 (*)    Calcium 10.4 (*)    GFR, Estimated 55 (*)    All other components within normal limits  CBC - Abnormal; Notable for the following components:   Hemoglobin 11.6 (*)    RDW 15.6 (*)    All other components within normal limits  BLOOD GAS, VENOUS - Abnormal; Notable for the following components:   Bicarbonate 31.5 (*)    Acid-Base Excess 5.2 (*)    All other components within normal limits  URINALYSIS, ROUTINE W REFLEX MICROSCOPIC - Abnormal; Notable for the following components:   Color, Urine YELLOW (*)    APPearance CLEAR (*)    Ketones, ur 5 (*)    All other components within normal limits  CK - Abnormal; Notable for the following components:   Total CK 408 (*)    All other components within normal limits  CBG MONITORING, ED - Abnormal; Notable for the following components:   Glucose-Capillary 106 (*)    All other components within normal limits  RESP  PANEL BY RT-PCR (FLU A&B, COVID) ARPGX2  CULTURE, BLOOD (ROUTINE X 2)  CULTURE, BLOOD (ROUTINE X 2)  LACTIC ACID, PLASMA  LACTIC ACID, PLASMA  PROCALCITONIN  TSH  AMMONIA  BRAIN NATRIURETIC PEPTIDE     EKG  ED ECG REPORT I, Delman Kitten, the attending physician, personally viewed and interpreted this ECG.  Date: 11/01/2021 EKG Time: 1240 Rate: 75 Rhythm: Atrial fibrillation rate controlled QRS Axis: normal Intervals: normal ST/T Wave abnormalities: normal Narrative Interpretation: no evidence of acute ischemia    RADIOLOGY  Chest x-ray interpreted by me is notable for cardiomegaly.  No obvious pulmonary edema noted however.  Radiologist reports possible mild interstitial edema or interstitial pneumonia but no obvious consolidation  CT Head Wo Contrast  Result Date: 11/01/2021 CLINICAL DATA:  [place]ium EXAM: CT HEAD WITHOUT CONTRAST TECHNIQUE: Contiguous axial images were obtained from the base of the skull through the vertex without intravenous contrast. RADIATION DOSE REDUCTION: This exam was performed according to the departmental dose-optimization program which includes automated exposure control, adjustment of the mA and/or kV according to patient size and/or use of iterative reconstruction technique. COMPARISON:  CT 07/27/2021 FINDINGS: Brain: No evidence of acute intracranial hemorrhage or extra-axial collection. No loss of gray-white matter differentiation.No evidence of mass lesion/concerning mass effect.The ventricles are normal in size.Scattered subcortical and periventricular white matter hypodensities, nonspecific but likely sequela of chronic small vessel ischemic disease.Mild cerebral atrophy Vascular: No hyperdense vessel or unexpected calcification. Skull: Mild hyperostosis frontalis internus. Negative for skull fracture. Sinuses/Orbits: No acute findings. Other: None. IMPRESSION: No acute intracranial abnormality. Unchanged atrophy and mild chronic small vessel ischemic  change. Electronically Signed   By: Maurine Simmering M.D.   On: 11/01/2021 14:13   DG Chest 1 View  Result Date: 11/01/2021 CLINICAL DATA:  Altered mental status EXAM: CHEST  1 VIEW COMPARISON:  Previous studies including the examination of 09/01/2021 FINDINGS: Transverse diameter of heart is increased. Apparent shift of mediastinum to the right may be due to rotation. Central pulmonary vessels are prominent without signs of alveolar pulmonary edema. There is no focal pulmonary consolidation. Interstitial markings in the parahilar regions appear prominent. There is no pleural effusion or pneumothorax. IMPRESSION: Cardiomegaly. Central pulmonary vessels are prominent without signs of alveolar pulmonary edema. There is prominence of interstitial markings in the parahilar regions which may suggest mild interstitial edema or interstitial pneumonia. There is no focal pulmonary consolidation. Electronically Signed   By: Prudy Feeler.D.  On: 11/01/2021 13:31       PROCEDURES:  Critical Care performed: No  Procedures   MEDICATIONS ORDERED IN ED: Medications  ipratropium-albuterol (DUONEB) 0.5-2.5 (3) MG/3ML nebulizer solution 3 mL (3 mLs Nebulization Given 11/01/21 1327)     IMPRESSION / MDM / ASSESSMENT AND PLAN / ED COURSE  I reviewed the triage vital signs and the nursing notes.                              Differential diagnosis includes, but is not limited to, encephalopathy, metabolic abnormality, ongoing urinary tract infection, COPD, obstructive or restrictive airway disease etc.  Differential diagnosis is broad.  Work-up.  The ER thus far fairly reassuring with negative COVID and influenza urinalysis without evidence of infection.  TSH just minimally elevated.  Her chest x-ray with mild possible interstitial edema versus infiltrate, but patient afebrile with normal white count and no obvious evidence of infection at this time.  Procalcitonin is low arguing against acute pulmonary  infection  Patient's presentation is most consistent with acute illness / injury with system symptoms.  The patient is on the cardiac monitor to evaluate for evidence of arrhythmia and/or significant heart rate changes.  Discussed with patient's daughter who is agreeable with plan for admission.   Consulted with the hospitalist Dr. Blaine Hamper, and he will plan to admit her and evaluate for further work-up as to cause of her altered sensorium.  At this juncture is not quite clear to me what the cause for her confusion is, but I do believe she would benefit from further work-up in the hospital.  At this point I have not clearly identified an acute emergent condition that would preclude her from further inpatient work-up under the hospitalist service.  FINAL CLINICAL IMPRESSION(S) / ED DIAGNOSES   Final diagnoses:  Disorientation  Somnolence, daytime     Rx / DC Orders   ED Discharge Orders     None        Note:  This document was prepared using Dragon voice recognition software and may include unintentional dictation errors.   Delman Kitten, MD 11/01/21 [phone]

## 2021-11-01 NOTE — H&P (Signed)
History and Physical    [name] RJJ:[id number] DOB: 1930-11-25 DOA: 11/01/2021  Referring MD/NP/PA:   PCP: [facility]   Patient coming from:  The patient is coming from ALF  Chief Complaint: AMS  HPI: [name] is a 88 y.o. female with medical history significant of COPD not on oxygen, asthma, hypothyroidism, gout, depression, OSA on CPAP, PVD, MRSA, dCHF, atrial fibrillation on Eliquis, CKD-3A, UTI, hyperparathyroidism, hypercalcemia, lymphedema, early stage of dementia, leg cellulitis, who presents with AMS.  Per her daughter at the bedside, patient has of early stage of dementia, but had a normal baseline, patient is oriented x3.  In the past several days, patient has been confused and more somnolent. When I saw pt in ED, pt is confused. Patient is still orientated to the person and place, not to the time.  She moves all extremities.  No facial droop or slurred speech.  Patient has mild shortness breath, denies chest pain, no cough, fever or chills.  No nausea, vomiting, diarrhea or abdominal pain.  Denies symptoms of UTI.   Data reviewed independently and ED Course: pt was found to have CK 408, ammonia level 13, negative COVID PCR, WBC 10.5, BNP 144, lactic acid 1.0, negative urinalysis, renal function at baseline, temperature normal, blood pressure 150/95, heart rate 45, 73, RR 20, oxygen saturation 93-95% on room air.  Chest x-ray showed mild interstitial edema and cardiomegaly.  Patient is placed on telemetry bed for position  EKG:  Not done in ED, will get one.   Review of Systems: Could not be reviewed accurately due to altered mental status.  Allergy:  Allergies  Allergen Reactions   Prednisone Other (See Comments)    Irritability   Aspirin Nausea Only   Sulfa Antibiotics Rash   Sulfacetamide Sodium Rash    Past Medical History:  Diagnosis Date   Arrhythmia    atrial fibrillation   Arthritis    Asthma    CHF (congestive heart failure) (HCC)     COPD (chronic obstructive pulmonary disease) (HCC)    Cough    Dementia (HCC)    GERD (gastroesophageal reflux disease)    Gout    Hiatal hernia    Hypertension    Hypothyroidism    MRSA (methicillin resistant staph aureus) culture positive    Neuropathic pain of right lower extremity    Peripheral vascular disease ([place])    "poor circulation"   Seasonal allergies    Shortness of breath dyspnea    Sleep apnea    uses CPAP (sometimes)    Past Surgical History:  Procedure Laterality Date   ANKLE ARTHROSCOPY     BACK SURGERY     L4-L5 Decompression   CARDIAC CATHETERIZATION  1995   CATARACT EXTRACTION W/PHACO Right 07/19/2014   Procedure: CATARACT EXTRACTION PHACO AND INTRAOCULAR LENS PLACEMENT ([place]);  Surgeon: Leandrew Koyanagi, MD;  Location: [facility];  Service: Ophthalmology;  Laterality: Right;   CATARACT EXTRACTION W/PHACO Left 08/23/2014   Procedure: CATARACT EXTRACTION PHACO AND INTRAOCULAR LENS PLACEMENT ([facility]);  Surgeon: Leandrew Koyanagi, MD;  Location: [facility];  Service: Ophthalmology;  Laterality: Left;   CHOLECYSTECTOMY     COLONOSCOPY     PARATHYROIDECTOMY  2003   TONSILLECTOMY     UVULOPALATOPHARYNGOPLASTY      Social History:  reports that she has never smoked. She has been exposed to tobacco smoke. She has never used smokeless tobacco. She reports that she does not drink alcohol and does not use  drugs.  Family History:  Family History  Problem Relation Age of Onset   Cancer Mother    Hyperlipidemia Son    Diabetes Son    Cancer Maternal Grandmother      Prior to Admission medications   Medication Sig Start Date End Date Taking? Authorizing Provider  acetaminophen (TYLENOL) 500 MG tablet Take 500-1,000 mg by mouth every 6 (six) hours as needed for mild pain or moderate pain.   Yes [provider]  albuterol (PROVENTIL) (2.5 MG/3ML) 0.083% nebulizer solution Take 2.5 mg by nebulization in the morning and at bedtime.   Yes  [provider]  albuterol (VENTOLIN HFA) 108 (90 Base) MCG/ACT inhaler Inhale 2 puffs into the lungs every 6 (six) hours as needed for wheezing or shortness of breath.   Yes [provider]  apixaban (ELIQUIS) 5 MG TABS tablet Take 5 mg by mouth 2 (two) times daily.   Yes [provider]  DULoxetine (CYMBALTA) 60 MG capsule Take 60 mg by mouth daily. 05/04/20  Yes [provider]  famotidine (PEPCID) 20 MG tablet Take 20 mg by mouth at bedtime.   Yes [provider]  furosemide (LASIX) 20 MG tablet Take 20 mg by mouth 2 (two) times daily. 04/04/21  Yes [provider]  hydrOXYzine (ATARAX) 10 MG tablet Take 10 mg by mouth 2 (two) times daily. 04/01/21  Yes [provider]  ipratropium (ATROVENT) 0.02 % nebulizer solution Take 0.5 mg by nebulization 4 (four) times daily.   Yes [provider]  levothyroxine (SYNTHROID) 150 MCG tablet Take 150 mcg by mouth daily. 04/01/21  Yes [provider]  lidocaine 4 % 1 patch daily. 10/17/21  Yes [provider]  loratadine (CLARITIN) 10 MG tablet Take 10 mg by mouth daily.   Yes [provider]  metoprolol succinate (TOPROL-XL) 25 MG 24 hr tablet Take 0.5 tablets (12.5 mg total) by mouth at bedtime. 07/10/20  Yes Wieting, Richard, MD  montelukast (SINGULAIR) 10 MG tablet Take 10 mg by mouth at bedtime.   Yes [provider]  Multiple Vitamins-Minerals (PRESERVISION AREDS 2+MULTI VIT) CAPS Take 1 capsule by mouth daily.   Yes [provider]  nystatin cream (MYCOSTATIN) Apply 1 application. topically 3 (three) times daily. (Apply under the breasts)   Yes [provider]  traMADol (ULTRAM) 50 MG tablet Take 50 mg by mouth every 6 (six) hours as needed.   Yes [provider]  triamcinolone cream (KENALOG) 0.1 % Apply 1 application. topically 2 (two) times daily.   Yes [provider]  vitamin B-12 (CYANOCOBALAMIN) 500 MCG tablet  Take 500 mcg by mouth daily.   Yes [provider]  benzonatate (TESSALON) 100 MG capsule Take 100 mg by mouth 3 (three) times daily as needed for cough.    [provider]    Physical Exam: Vitals:   11/01/21 1300 11/01/21 1330 11/01/21 1500 11/01/21 1530  BP: (!) 149/108 (!) 185/86 (!) 150/95 (!) 138/95  Pulse: 69 (!) 45 73 92  Resp: '17 19 14 16  '$ Temp:      TempSrc:      SpO2: 93% 95% 93% 95%  Weight:      Height:       General: Not in acute distress HEENT:       Eyes: PERRL, EOMI, no scleral icterus.       ENT: No discharge from the ears and nose       Neck: No JVD, no  bruit, no mass felt. Heme: No neck lymph node enlargement. Cardiac: S1/S2, RRR, No murmurs, No gallops or rubs. Respiratory: No rales, wheezing, rhonchi or rubs. GI: Soft, nondistended, nontender, no organomegaly, BS present. GU: No hematuria Ext: Has chronic lymphedema in both legs, 1+ leg edema bilaterally, has mild erythema in both lower leg, but no warmth Musculoskeletal: No joint deformities, No joint redness or warmth, no limitation of ROM in spin. Skin: No rashes.  Neuro: Confused, orientated to the place and person, not to the time.  Oriented X3, cranial nerves II-XII grossly intact, moves all extremities. Psych: Patient is not psychotic, no suicidal or hemocidal ideation.  Labs on Admission: I have personally reviewed following labs and imaging studies  CBC: Recent Labs  Lab 11/01/21 1236  WBC 10.5  HGB 11.6*  HCT 36.7  MCV 85.5  PLT 132   Basic Metabolic Panel: Recent Labs  Lab 11/01/21 1236  NA 138  K 3.8  CL 103  CO2 27  GLUCOSE 128*  BUN 25*  CREATININE 0.99  CALCIUM 10.4*   GFR: Estimated Creatinine Clearance: 51.4 mL/min (by C-G formula based on SCr of 0.99 mg/dL). Liver Function Tests: Recent Labs  Lab 11/01/21 1236  AST 29  ALT 13  ALKPHOS 74  BILITOT 0.9  PROT 7.1  ALBUMIN 3.7   No results for input(s): "LIPASE", "AMYLASE" in the last 168  hours. Recent Labs  Lab 11/01/21 1606  AMMONIA 13   Coagulation Profile: No results for input(s): "INR", "PROTIME" in the last 168 hours. Cardiac Enzymes: Recent Labs  Lab 11/01/21 1237  CKTOTAL 408*   BNP (last 3 results) No results for input(s): "PROBNP" in the last 8760 hours. HbA1C: No results for input(s): "HGBA1C" in the last 72 hours. CBG: Recent Labs  Lab 11/01/21 1249  GLUCAP 106*   Lipid Profile: No results for input(s): "CHOL", "HDL", "LDLCALC", "TRIG", "CHOLHDL", "LDLDIRECT" in the last 72 hours. Thyroid Function Tests: Recent Labs    11/01/21 1237  TSH 4.569*   Anemia Panel: No results for input(s): "VITAMINB12", "FOLATE", "FERRITIN", "TIBC", "IRON", "RETICCTPCT" in the last 72 hours. Urine analysis:    Component Value Date/Time   COLORURINE YELLOW (A) 11/01/2021 1520   APPEARANCEUR CLEAR (A) 11/01/2021 1520   APPEARANCEUR Clear 01/19/2013 1248   LABSPEC 1.010 11/01/2021 1520   LABSPEC 1.004 01/19/2013 1248   PHURINE 7.0 11/01/2021 1520   GLUCOSEU NEGATIVE 11/01/2021 1520   GLUCOSEU Negative 01/19/2013 1248   HGBUR NEGATIVE 11/01/2021 1520   BILIRUBINUR NEGATIVE 11/01/2021 1520   BILIRUBINUR Negative 01/19/2013 1248   KETONESUR 5 (A) 11/01/2021 1520   PROTEINUR NEGATIVE 11/01/2021 1520   NITRITE NEGATIVE 11/01/2021 1520   LEUKOCYTESUR NEGATIVE 11/01/2021 1520   LEUKOCYTESUR Negative 01/19/2013 1248   Sepsis Labs: '@LABRCNTIP'$ (procalcitonin:4,lacticidven:4) ) Recent Results (from the past 240 hour(s))  Resp Panel by RT-PCR (Flu A&B, Covid) Anterior Nasal Swab     Status: None   Collection Time: 11/01/21  1:19 PM   Specimen: Anterior Nasal Swab  Result Value Ref Range Status   SARS Coronavirus 2 by RT PCR NEGATIVE NEGATIVE Final    Comment: (NOTE) SARS-CoV-2 target nucleic acids are NOT DETECTED.  The SARS-CoV-2 RNA is generally detectable in upper respiratory specimens during the acute phase of infection. The lowest concentration of  SARS-CoV-2 viral copies this assay can detect is 138 copies/mL. A negative result does not preclude SARS-Cov-2 infection and should not be used as the sole basis for treatment or other patient management decisions. A negative  result may occur with  improper specimen collection/handling, submission of specimen other than nasopharyngeal swab, presence of viral mutation(s) within the areas targeted by this assay, and inadequate number of viral copies(<138 copies/mL). A negative result must be combined with clinical observations, patient history, and epidemiological information. The expected result is Negative.  Fact Sheet for Patients:  EntrepreneurPulse.com.au  Fact Sheet for Healthcare Providers:  IncredibleEmployment.be  This test is no t yet approved or cleared by the [place] FDA and  has been authorized for detection and/or diagnosis of SARS-CoV-2 by FDA under an Emergency Use Authorization (EUA). This EUA will remain  in effect (meaning this test can be used) for the duration of the COVID-19 declaration under Section 564(b)(1) of the Act, 21 U.S.C.section 360bbb-3(b)(1), unless the authorization is terminated  or revoked sooner.       Influenza A by PCR NEGATIVE NEGATIVE Final   Influenza B by PCR NEGATIVE NEGATIVE Final    Comment: (NOTE) The Xpert Xpress SARS-CoV-2/FLU/RSV plus assay is intended as an aid in the diagnosis of influenza from Nasopharyngeal swab specimens and should not be used as a sole basis for treatment. Nasal washings and aspirates are unacceptable for Xpert Xpress SARS-CoV-2/FLU/RSV testing.  Fact Sheet for Patients: EntrepreneurPulse.com.au  Fact Sheet for Healthcare Providers: IncredibleEmployment.be  This test is not yet approved or cleared by the [place] FDA and has been authorized for detection and/or diagnosis of SARS-CoV-2 by FDA under an Emergency Use  Authorization (EUA). This EUA will remain in effect (meaning this test can be used) for the duration of the COVID-19 declaration under Section 564(b)(1) of the Act, 21 U.S.C. section 360bbb-3(b)(1), unless the authorization is terminated or revoked.  Performed at [facility], [address] [place],  [address]      Radiological Exams on Admission: CT Head Wo Contrast  Result Date: 11/01/2021 CLINICAL DATA:  [place]irium EXAM: CT HEAD WITHOUT CONTRAST TECHNIQUE: Contiguous axial images were obtained from the base of the skull through the vertex without intravenous contrast. RADIATION DOSE REDUCTION: This exam was performed according to the departmental dose-optimization program which includes automated exposure control, adjustment of the mA and/or kV according to patient size and/or use of iterative reconstruction technique. COMPARISON:  CT 07/27/2021 FINDINGS: Brain: No evidence of acute intracranial hemorrhage or extra-axial collection. No loss of gray-white matter differentiation.No evidence of mass lesion/concerning mass effect.The ventricles are normal in size.Scattered subcortical and periventricular white matter hypodensities, nonspecific but likely sequela of chronic small vessel ischemic disease.Mild cerebral atrophy Vascular: No hyperdense vessel or unexpected calcification. Skull: Mild hyperostosis frontalis internus. Negative for skull fracture. Sinuses/Orbits: No acute findings. Other: None. IMPRESSION: No acute intracranial abnormality. Unchanged atrophy and mild chronic small vessel ischemic change. Electronically Signed   By: Maurine Simmering M.D.   On: 11/01/2021 14:13   DG Chest 1 View  Result Date: 11/01/2021 CLINICAL DATA:  Altered mental status EXAM: CHEST  1 VIEW COMPARISON:  Previous studies including the examination of 09/01/2021 FINDINGS: Transverse diameter of heart is increased. Apparent shift of mediastinum to the right may be due to rotation. Central pulmonary  vessels are prominent without signs of alveolar pulmonary edema. There is no focal pulmonary consolidation. Interstitial markings in the parahilar regions appear prominent. There is no pleural effusion or pneumothorax. IMPRESSION: Cardiomegaly. Central pulmonary vessels are prominent without signs of alveolar pulmonary edema. There is prominence of interstitial markings in the parahilar regions which may suggest mild interstitial edema or interstitial pneumonia. There is no focal pulmonary  consolidation. Electronically Signed   By: Elmer Picker M.D.   On: 11/01/2021 13:31      Assessment/Plan Principal Problem:   Acute metabolic encephalopathy Active Problems:   Elevated CK   Atrial fibrillation, chronic (HCC)   Chronic diastolic CHF (congestive heart failure) (HCC)   Chronic kidney disease, stage 3a (HCC)   COPD (chronic obstructive pulmonary disease) (HCC)   Hypercalcemia   Hypothyroidism   Hyperparathyroidism ([place])   Hypertension   Morbid obesity with BMI of 45.0-49.9, adult (HCC)   OSA (obstructive sleep apnea)   Depression with anxiety   Assessment and Plan: * Acute metabolic encephalopathy Etiology is not clear.  Patient moves all extremities, no facial droop or slurred speech.  Low suspicions for stroke.  CT head negative.  Urinalysis negative.  Patient has  mild erythema in both legs, but per her daughter, this is chronic looking for her legs, no warmth, less likely to have cellulitis.  Differential diagnosis include progression of dementia versus delirium.  -Placed on telemetry bed for observation -Frequent neurochecks -Check vitamin B12 level -Check TSH level --> 4.569 -Bx is ordered by EDP -Hold hydroxyzine  Elevated CK CK level is 408 -will not give IVF since pt has history of CHF, elevated BNP 144, bilateral leg edema, already at risk of developing CHF exacerbation -recheck CK level in AM  Atrial fibrillation, chronic (HCC) Heart rate 45, 73 -Continue  metoprolol, Eliquis  Chronic diastolic CHF (congestive heart failure) ([place]) 2D echo on 07/07/2021 showed EF of 55 to 60%.  Patient has 1+ leg edema, BNP 144, oxygen saturation 93-95% on room air.  Does not seem to have CHF exacerbation, but obviously at high risk of developing CHF exacerbation. -Continue home Lasix 20 mg twice daily  Chronic kidney disease, stage 3a ([place]) Stable renal function.  Creatinine 0.99 -Follow-up with BMP  COPD (chronic obstructive pulmonary disease) (HCC) No wheezing or rhonchi on auscultation. -Bronchodilators  Hypercalcemia Hypercalcinemia and of history of hyperparathyroidism: Calcium 10.4 -Follow-up with BMP  Hyperparathyroidism ([place]) -see above  Hypothyroidism TSH 4.569, slightly elevated. - Continue Synthroid, will not make dose adjustment due to her old age 43  Hypertension - IV hydralazine as needed -Metoprolol -Patient is also on Lasix  Morbid obesity with BMI of 45.0-49.9, adult (HCC)  BMI= 48.92  and BW= 129.3 -Diet and exercise.   -Encourage to lose weight.   Depression with anxiety -continue Cymbalta -Hold hydroxyzine due to altered mental status  OSA (obstructive sleep apnea) - CPAP          DVT ppx: on Eliquis  Code Status: Partial code (per her daughter,  patient wants to be partial code, OK for CPR, but no intubation).  Family Communication:   Yes, patient's  daughter  at bed side.    Disposition Plan:  Anticipate discharge back to previous environment, ALF  Consults called:  none  Admission status and Level of care: Telemetry Medical:  for obs     Dispo: The patient is from: ALF              Anticipated d/c is to: ALF              Anticipated d/c date is: 1 day              Patient currently is not medically stable to d/c.    Severity of Illness:  The appropriate patient status for this patient is OBSERVATION. Observation status is judged to be reasonable and necessary in order to  provide the  required intensity of service to ensure the patient's safety. The patient's presenting symptoms, physical exam findings, and initial radiographic and laboratory data in the context of their medical condition is felt to place them at decreased risk for further clinical deterioration. Furthermore, it is anticipated that the patient will be medically stable for discharge from the hospital within 2 midnights of admission.        Date of Service 11/01/2021    [facility]   If 7PM-7AM, please contact night-coverage [email or web address] 11/01/2021, 5:07 PM

## 2021-11-01 NOTE — ED Triage Notes (Addendum)
Pt to ED via [facility] from [facility]. Facility states pt has been having AMS since yesterday. Pt has no hx of dementia. EMS states only information they got out of pt is burning sensation in feet. Unsure of baseline. EMS states they were initially called for a her sliding out of her recliner.   Adible wheezing and labored breathing noted. Pt with hx COPD and asthma

## 2021-11-01 NOTE — Assessment & Plan Note (Signed)
-  see above 

## 2021-11-01 NOTE — Assessment & Plan Note (Signed)
Stable renal function.  Creatinine 0.99 -Follow-up with BMP

## 2021-11-01 NOTE — Assessment & Plan Note (Addendum)
CK level is 408 -will not give IVF since pt has history of CHF, elevated BNP 144, bilateral leg edema, already at risk of developing CHF exacerbation -recheck CK level in AM

## 2021-11-01 NOTE — Assessment & Plan Note (Signed)
Hypercalcinemia and of history of hyperparathyroidism: Calcium 10.4-10.6 which is baseline for patient. -Follow-up BMP in 1-2 weeks

## 2021-11-02 DIAGNOSIS — G9341 Metabolic encephalopathy: Secondary | ICD-10-CM

## 2021-11-02 DIAGNOSIS — R531 Weakness: Secondary | ICD-10-CM

## 2021-11-02 LAB — BASIC METABOLIC PANEL
Anion gap: 8 (ref 5–15)
BUN: 23 mg/dL (ref 8–23)
CO2: 26 mmol/L (ref 22–32)
Calcium: 10.6 mg/dL — ABNORMAL HIGH (ref 8.9–10.3)
Chloride: 104 mmol/L (ref 98–111)
Creatinine, Ser: 0.87 mg/dL (ref 0.44–1.00)
GFR, Estimated: >60 mL/min (ref >60)
Glucose, Bld: 104 mg/dL — ABNORMAL HIGH (ref 70–99)
Potassium: 3.6 mmol/L (ref 3.5–5.1)
Sodium: 138 mmol/L (ref 135–145)

## 2021-11-02 LAB — CBC
HCT: 34.1 % — ABNORMAL LOW (ref 36.0–46.0)
Hemoglobin: 10.8 g/dL — ABNORMAL LOW (ref 12.0–15.0)
MCH: 27 pg (ref 26.0–34.0)
MCHC: 31.7 g/dL (ref 30.0–36.0)
MCV: 85.3 fL (ref 80.0–100.0)
Platelets: 243 10*3/uL (ref 150–400)
RBC: 4 MIL/uL (ref 3.87–5.11)
RDW: 15.4 % (ref 11.5–15.5)
WBC: 10.4 10*3/uL (ref 4.0–10.5)
nRBC: 0 % (ref 0.0–0.2)

## 2021-11-02 LAB — CK: Total CK: 286 U/L — ABNORMAL HIGH (ref 38–234)

## 2021-11-02 LAB — GLUCOSE, CAPILLARY: Glucose-Capillary: 96 mg/dL (ref 70–99)

## 2021-11-02 NOTE — Progress Notes (Deleted)
Notified by lab that they had attempted to get in touch with Dr Tobie Poet about a lab she ordered on csf.  [place] spotted fever cannot be done on the CSF. Asked to leave note to notify MD of reason lab order not completed.

## 2021-11-02 NOTE — Progress Notes (Signed)
  Chaplain On-Call received a referral from [place], who reported that she attempted last evening to visit with patient's daughter.  The patient's daughter had asked for discussion about Advance Directives with the patient. However, Chaplain Ruell reported that the daughter had left for home last night.  This Chaplain visited briefly with the patient, who was very sleepy and requested a visit at a later time. Chaplain will follow-up later today.  Chaplain Pollyann Samples M.Div., [facility]

## 2021-11-02 NOTE — Progress Notes (Addendum)
Progress Note   Patient: [name] NKN:[id number] DOB: 06-09-1930 DOA: 11/01/2021     0 DOS: the patient was seen and examined on 11/02/2021   Brief hospital course:  [name] is a 88 y.o. female from ALF with medical history significant of COPD not on oxygen, asthma, hypothyroidism, gout, depression, OSA on CPAP, PVD, MRSA, dCHF, atrial fibrillation on Eliquis, CKD-3A, UTI, hyperparathyroidism, hypercalcemia, lymphedema, early stage of dementia, leg cellulitis, who presented on 11/01/2021 for evaluation of altered mental status.  Despite history of dementia, pt reportedly oriented x 3 at baseline, but had become more somnolent and confused.  Daughter reported pt had recently been treated for UTI, and she gets confusion with these.   Pt denied UTI symptoms, CP, cough, congestion, fever/chills, N/V/D or other complaints.    Evaluation in the ED -- pCO2 52 on VBG, normal ammonia level, CK 408, Covid and influenza negative, WBC 10.5k, BNP 144, normal lactate, normal UA, baseline renal function, afebrile.  Chest xray with mild interstitial edema and cardiomegaly, no focal infiltrates.  Admitted to medicine service for further evaluation and management.     Assessment and Plan: * Acute metabolic encephalopathy Etiology is not clear.  Patient moves all extremities, no facial droop or slurred speech.  Low suspicions for stroke.  CT head negative.  Urinalysis negative.  Patient has  mild erythema in both legs, but per her daughter, this is chronic looking for her legs, no warmth, less likely to have cellulitis.   Ammonia level and pCO2 were normal. Differential diagnosis include progression of dementia versus delirium. TSH slightly elevated 4.569  -- Delirium precautions -- Continue neuro checks -- Hold hydroxyzine -- Follow blooc culture -- Follow up thiamine level    Elevated CK CK level is 408 -will not give IVF since pt has history of CHF, elevated BNP 144, bilateral leg edema,  already at risk of developing CHF exacerbation -recheck CK level in AM  Atrial fibrillation, chronic (HCC) Heart rate 45, 73 -Continue metoprolol, Eliquis  Chronic diastolic CHF (congestive heart failure) ([place]) 2D echo on 07/07/2021 showed EF of 55 to 60%.  Patient has 1+ leg edema, BNP 144, oxygen saturation 93-95% on room air.  Does not seem to have CHF exacerbation, but obviously at high risk of developing CHF exacerbation. -Continue home Lasix 20 mg twice daily  Chronic kidney disease, stage 3a ([place]) Stable renal function.  Creatinine 0.99 -Follow-up with BMP  COPD (chronic obstructive pulmonary disease) (HCC) No wheezing or rhonchi on auscultation. -Bronchodilators  Hypercalcemia Hypercalcinemia and of history of hyperparathyroidism: Calcium 10.4 -Follow-up with BMP  Hyperparathyroidism ([place]) -see above  Hypothyroidism TSH 4.569, slightly elevated. - Continue Synthroid, will not make dose adjustment due to her old age 74  Hypertension - IV hydralazine as needed -Metoprolol -Patient is also on Lasix  Morbid obesity with BMI of 45.0-49.9, adult ([place]) Body mass index is 50.71 kg/m. Complicates overall care and prognosis.  Recommend lifestyle modifications including physical activity and diet for weight loss and overall long-term health.    Depression with anxiety -continue Cymbalta -Hold hydroxyzine due to altered mental status  OSA (obstructive sleep apnea) - CPAP  Generalized weakness PT/OT evaluations Fall precautions May require rehab prior to return to ALF        Subjective: Pt awake resting in bed, very drowsy and confused.  She reports needing have a BM and asking for help to do so.  She denies feeling sick, headache, N/V, abdominal pain, sore throat, cough, congestions,  dysuria or other complaints.  She can tell me she is in hospital, but upon more questions, she reiterates need to have BM before 'it goes all over'.  No acute events reported.     Physical Exam: Vitals:   11/02/21 1041 11/02/21 1155 11/02/21 1535 11/02/21 1621  BP:  (!) 139/56 120/63   Pulse:  85 80   Resp:  17 16   Temp:  97.9 F (36.6 C) 97.8 F (36.6 C)   TempSrc:      SpO2: 98% 98% 92% 97%  Weight:      Height:       General exam: awake, very drowsy appearing, no acute distress, obese HEENT: atraumatic, clear conjunctiva, anicteric sclera, moist mucus membranes, hearing grossly normal  Respiratory system: CTAB but diminished due to body habitus, no wheezes, normal respiratory effort. Cardiovascular system: normal S1/S2, RRR, bilateral pedal/ankle edema appears chronic Gastrointestinal system: soft, NT, ND, no HSM felt, +bowel sounds. Central nervous system: A&O x2+. no gross focal neurologic deficits, normal speech Extremities: distal BLE mild erythema without differential warms, trace to 1+ BLE edema,  Skin: dry, intact, normal temperature Psychiatry: normal mood, congruent affect  Data Reviewed:  Notable labs --- CK 286 from 405, glucose 104, Ca 10.6 (baseline for pt), WBC 10.4, Hbg 10.8, MRSA not deteced.  Blood cultures pending   Family Communication: updated daughter by phone this evening  Pt has been followed at ALF by hospice for some time.  Will continue to follow with hospice and daughter prefers to return to ALF.  With SNF recommended, unclear if ALF can accommodate her needs going forward.  TOC consulted.   Disposition: Status is: Observation The patient remains OBS appropriate and will d/c before 2 midnights.  Remains in hospital due to persistent encephalopathy. Ongoing evaluation and awaiting improvement.   Planned Discharge Destination:  Return to ALF vs SNF    Time spent: 40 minutes  Author: Ezekiel Slocumb, DO 11/02/2021 5:09 PM  For on call review [email or web address].

## 2021-11-02 NOTE — Assessment & Plan Note (Signed)
PT/OT evaluations Fall precautions May require rehab prior to return to ALF

## 2021-11-02 NOTE — Evaluation (Signed)
Occupational Therapy Evaluation Patient Details Name: [name] MRN: [id number] DOB: [birth year] Today's Date: 11/02/2021   History of Present Illness [name] is a 88 y.o. female with medical history significant of COPD not on oxygen, asthma, hypothyroidism, gout, depression, OSA on CPAP, PVD, MRSA, dCHF, atrial fibrillation on Eliquis, CKD-3A, UTI, hyperparathyroidism, hypercalcemia, lymphedema, early stage of dementia, leg cellulitis, who presents with AMS.   Clinical Impression   Patient presenting with decreased Ind in self care, balance, functional mobility/transfers, endurance, and safety awareness. Patient is very confused during sesssion. Per chart review, pt is oriented to self and location at baseline. She is oriented to self only. No family present to confirm baseline. Pt reports until 1 week ago she was ambulating with rollator and able to perform self care tasks without assistance.In the last week, pt reports she has been using a wheelchair and she can't walk. Patient on bedpan during this session and cries out in pain when she rolls to be taken off with mod A. Bed linens are saturated. OT performing hygiene and RN and NT present to change linens as therapist exits the room. Patient will benefit from acute OT to increase overall independence in the areas of ADLs, functional mobility, and safety awareness in order to safely discharge to next venue of care.      Recommendations for follow up therapy are one component of a multi-disciplinary discharge planning process, led by the attending physician.  Recommendations may be updated based on patient status, additional functional criteria and insurance authorization.   Follow Up Recommendations  Skilled nursing-short term rehab (<3 hours/day)    Assistance Recommended at Discharge Frequent or constant Supervision/Assistance  Patient can return home with the following A lot of help with walking and/or transfers;A lot of help with  bathing/dressing/bathroom;Assistance with cooking/housework;Help with stairs or ramp for entrance;Assist for transportation;Direct supervision/assist for financial management;Direct supervision/assist for medications management    Functional Status Assessment  Patient has had a recent decline in their functional status and demonstrates the ability to make significant improvements in function in a reasonable and predictable amount of time.  Equipment Recommendations  Other (comment) (defer to next venue of care)       Precautions / Restrictions Precautions Precautions: Fall Restrictions Weight Bearing Restrictions: No      Mobility Bed Mobility Overal bed mobility: Needs Assistance Bed Mobility: Rolling Rolling: Mod assist              Transfers                   General transfer comment: Pt refusal          ADL either performed or assessed with clinical judgement   ADL Overall ADL's : Needs assistance/impaired                                       General ADL Comments: mod A to roll to remove bed pan and total A for hygiene.     Vision Patient Visual Report: No change from baseline              Pertinent Vitals/Pain Pain Assessment Pain Assessment: Faces Faces Pain Scale: Hurts even more Pain Location: R hip Pain Descriptors / Indicators: Crying, Discomfort Pain Intervention(s): Repositioned, Monitored during session        Extremity/Trunk Assessment Upper Extremity Assessment Upper Extremity Assessment: Generalized weakness   Lower  Extremity Assessment Lower Extremity Assessment: Generalized weakness       Communication Communication Communication: HOH   Cognition Arousal/Alertness: Awake/alert Behavior During Therapy: WFL for tasks assessed/performed Overall Cognitive Status: History of cognitive impairments - at baseline                                 General Comments: Pt is oriented to self only  during session and very confused throughout session                [place] expects to be discharged to:: Assisted living                                 Additional Comments: per chart review, pt lives at [facility]      Prior Functioning/Environment Prior Level of Function : Needs assist;Patient poor historian/Family not available             Mobility Comments: Pt is very poor historian but reports use of rollator until 1 week ago and now , " I can't walk". ADLs Comments: Pt reports she is mod I for self care tasks and meals brought to her room.        OT Problem List: Decreased strength;Decreased activity tolerance;Impaired balance (sitting and/or standing);Decreased safety awareness      OT Treatment/Interventions: Self-care/ADL training;Therapeutic exercise;Therapeutic activities;Energy conservation;DME and/or AE instruction;Manual therapy;Balance training;Patient/family education    OT Goals(Current goals can be found in the care plan section) Acute Rehab OT Goals Patient Stated Goal: none stated OT Goal Formulation: With patient Time For Goal Achievement: 11/16/21 Potential to Achieve Goals: Good ADL Goals Pt Will Perform Grooming: with supervision;with set-up;sitting Pt Will Perform Lower Body Dressing: with min assist;sit to/from stand Pt Will Transfer to Toilet: with min assist Pt Will Perform Toileting - Clothing Manipulation and hygiene: with min assist  OT Frequency: Min 2X/week       AM-PAC OT "6 Clicks" Daily Activity     Outcome Measure Help from another person eating meals?: None Help from another person taking care of personal grooming?: A Little Help from another person toileting, which includes using toliet, bedpan, or urinal?: Total Help from another person bathing (including washing, rinsing, drying)?: Total Help from another person to put on and taking off regular upper body clothing?: A Lot Help from another  person to put on and taking off regular lower body clothing?: Total 6 Click Score: 12   End of Session Nurse Communication: Mobility status  Activity Tolerance: Patient limited by pain Patient left: in bed;with call bell/phone within reach;with nursing/sitter in room;with bed alarm set  OT Visit Diagnosis: Unsteadiness on feet (R26.81);Muscle weakness (generalized) (M62.81);Repeated falls (R29.6)                Time: 4403-4742 OT Time Calculation (min): 20 min Charges:  OT General Charges $OT Visit: 1 Visit OT Evaluation $OT Eval Moderate Complexity: 1 Mod OT Treatments $Self Care/Home Management : 8-22 mins  Darleen Crocker, MS, OTR/L , CBIS ascom [phone]  11/02/21, 12:46 PM

## 2021-11-02 NOTE — Hospital Course (Signed)
[name] is a 88 y.o. female from ALF with medical history significant of COPD not on oxygen, asthma, hypothyroidism, gout, depression, OSA on CPAP, PVD, MRSA, dCHF, atrial fibrillation on Eliquis, CKD-3A, UTI, hyperparathyroidism, hypercalcemia, lymphedema, early stage of dementia, leg cellulitis, who presented on 11/01/2021 for evaluation of altered mental status.  Despite history of dementia, pt reportedly oriented x 3 at baseline, but had become more somnolent and confused.  Daughter reported pt had recently been treated for UTI, and she gets confusion with these.   Pt denied UTI symptoms, CP, cough, congestion, fever/chills, N/V/D or other complaints.    Evaluation in the ED -- pCO2 52 on VBG, normal ammonia level, CK 408, Covid and influenza negative, WBC 10.5k, BNP 144, normal lactate, normal UA, baseline renal function, afebrile.  Chest xray with mild interstitial edema and cardiomegaly, no focal infiltrates.  Admitted to medicine service for further evaluation and management.

## 2021-11-02 NOTE — Evaluation (Signed)
Physical Therapy Evaluation Patient Details Name: [name] MRN: [id number] DOB: [birth year] Today's Date: 11/02/2021  History of Present Illness  Pt is an 88 y.o. female presenting to hospital 9/8 with c/o AMS (found next to her bed--suspect she might have rolled out of the bed during the night).  Recent treatment for UTI.  Prior admission June 6th 2023 for B LE cellulitis.  Pt admitted with acute metabolic encephalopathy and elevated CK.  PMH includes HF, asthma, COPD, htn, B LE swelling, OSA on CPAP, a-fib, CKD, lymphedema, early stage of dementia.  Clinical Impression  Prior to hospital admission, pt was most recently transferring to w/c with assist and propelling w/c on own (for last 2 weeks); prior to that pt was ambulatory with 4ww with staff assistance; lives at [facility] (pt's daughter reports pt on hospice).  Currently pt is 1-2 assist with bed mobility; demonstrates good sitting balance; and unable to stand up to RW with 1 assist.  Pt's linens on bed noted to be wet so NT notified and pt's bed linens changed.  Pt able to scoot to R along bed with min to mod assist and extra effort/time before laying down in bed and requiring 2 assist to boost pt up in bed using bed linen pad.  Pt would benefit from skilled PT to address noted impairments and functional limitations (see below for any additional details).  Upon hospital discharge, pt would benefit from SNF to improve strength and functional mobility (pt's daughter reports wanting pt to discharge back to ALF with hospice).   Recommendations for follow up therapy are one component of a multi-disciplinary discharge planning process, led by the attending physician.  Recommendations may be updated based on patient status, additional functional criteria and insurance authorization.  Follow Up Recommendations Skilled nursing-short term rehab (<3 hours/day) (daughter reports plan for return to ALF with hospice) Can patient physically be  transported by private vehicle: No    Assistance Recommended at Discharge Frequent or constant Supervision/Assistance  Patient can return home with the following  Two people to help with walking and/or transfers;Two people to help with bathing/dressing/bathroom;Assistance with cooking/housework;Direct supervision/assist for medications management;Assist for transportation;Help with stairs or ramp for entrance    Equipment Recommendations Hospital bed;Other (comment) (hoyer lift)  Recommendations for Other Services       Functional Status Assessment Patient has had a recent decline in their functional status and demonstrates the ability to make significant improvements in function in a reasonable and predictable amount of time.     Precautions / Restrictions Precautions Precautions: Fall Restrictions Weight Bearing Restrictions: No      Mobility  Bed Mobility Overal bed mobility: Needs Assistance Bed Mobility: Supine to Sit, Rolling, Sit to Supine Rolling: Mod assist (logrolling L and R in bed)   Supine to sit: Mod assist (assist for trunk) Sit to supine: +2 for physical assistance (assist for trunk and B LE's)   General bed mobility comments: vc's for technique    Transfers Overall transfer level: Needs assistance Equipment used: Rolling walker (2 wheels) Transfers: Sit to/from Stand Sit to Stand: Total assist           General transfer comment: unable to stand with 1 assist, RW use, and max cueing    Ambulation/Gait                  Stairs            Wheelchair Mobility    Modified Rankin (Stroke Patients  Only)       Balance Overall balance assessment: Needs assistance Sitting-balance support: No upper extremity supported, Feet supported Sitting balance-Leahy Scale: Good Sitting balance - Comments: steady sitting reaching within BOS       Standing balance comment: unable to stand to attempt                              Pertinent Vitals/Pain Pain Assessment Pain Assessment: Faces Faces Pain Scale: Hurts little more (4/10 at rest; 6-8/10 with activity) Pain Location: R hip Pain Descriptors / Indicators: Tender, Sore Pain Intervention(s): Limited activity within patient's tolerance, Monitored during session, Repositioned Vitals (HR and O2 on room air) stable and WFL throughout treatment session.    Home Living Family/patient expects to be discharged to:: Assisted living                 Home Equipment: Rollator (4 wheels);Wheelchair - manual Additional Comments: [facility] (on hospice per pt's daughter)    Prior Function               Mobility Comments: Pt's daughter reports pt has been using manual w/c for past 2 weeks but was ambulating with rollator prior to that.  Pt able to propel manual w/c on own.  Staff assists pt with all mobility except w/c propulsion. ADLs Comments: Per OT eval "Pt reports she is mod I for self care tasks and meals brought to her room."     Hand Dominance        Extremity/Trunk Assessment   Upper Extremity Assessment Upper Extremity Assessment: Generalized weakness    Lower Extremity Assessment Lower Extremity Assessment: Generalized weakness       Communication   Communication: HOH  Cognition Arousal/Alertness: Awake/alert Behavior During Therapy: WFL for tasks assessed/performed Overall Cognitive Status: Impaired/Different from baseline                                 General Comments: Oriented to person and date only; pt reported being at [place] cleared pt for participation in physical therapy.  Pt agreeable to PT session.  Pt's daughter present during session.    Exercises     Assessment/Plan    PT Assessment Patient needs continued PT services  PT Problem List Decreased strength;Decreased activity tolerance;Decreased balance;Decreased mobility;Decreased  cognition;Decreased knowledge of use of DME;Decreased safety awareness;Decreased knowledge of precautions;Pain       PT Treatment Interventions DME instruction;Gait training;Functional mobility training;Therapeutic activities;Therapeutic exercise;Balance training;Cognitive remediation;Patient/family education    PT Goals (Current goals can be found in the Care Plan section)  Acute Rehab PT Goals Patient Stated Goal: to improve strength and mobility PT Goal Formulation: With patient/family Time For Goal Achievement: 11/16/21 Potential to Achieve Goals: Fair    Frequency Min 2X/week     Co-evaluation               AM-PAC PT "6 Clicks" Mobility  Outcome Measure Help needed turning from your back to your side while in a flat bed without using bedrails?: A Lot Help needed moving from lying on your back to sitting on the side of a flat bed without using bedrails?: A Lot Help needed moving to and from a bed to a chair (including a wheelchair)?: Total Help needed standing up from a chair using your arms (  e.g., wheelchair or bedside chair)?: Total Help needed to walk in hospital room?: Total Help needed climbing 3-5 steps with a railing? : Total 6 Click Score: 8    End of Session Equipment Utilized During Treatment: Gait belt Activity Tolerance: Patient limited by fatigue Patient left: in bed;with call bell/phone within reach;with bed alarm set;with family/visitor present (B heels floating via pillow support) Nurse Communication: Mobility status;Precautions PT Visit Diagnosis: Other abnormalities of gait and mobility (R26.89);Muscle weakness (generalized) (M62.81);History of falling (Z91.81);Pain Pain - Right/Left: Right Pain - part of body: Hip    Time: 6067-7034 PT Time Calculation (min) (ACUTE ONLY): 50 min   Charges:   PT Evaluation $PT Eval Low Complexity: 1 Low PT Treatments $Therapeutic Activity: 23-37 mins       Leitha Bleak, PT 11/02/21, 5:15 PM

## 2021-11-03 ENCOUNTER — Encounter: Payer: Self-pay | Admitting: Internal Medicine

## 2021-11-03 DIAGNOSIS — G9341 Metabolic encephalopathy: Secondary | ICD-10-CM | POA: Diagnosis not present

## 2021-11-03 LAB — CBC
HCT: 34.6 % — ABNORMAL LOW (ref 36.0–46.0)
Hemoglobin: 11 g/dL — ABNORMAL LOW (ref 12.0–15.0)
MCH: 26.7 pg (ref 26.0–34.0)
MCHC: 31.8 g/dL (ref 30.0–36.0)
MCV: 84 fL (ref 80.0–100.0)
Platelets: 261 10*3/uL (ref 150–400)
RBC: 4.12 MIL/uL (ref 3.87–5.11)
RDW: 15.3 % (ref 11.5–15.5)
WBC: 10 10*3/uL (ref 4.0–10.5)
nRBC: 0 % (ref 0.0–0.2)

## 2021-11-03 LAB — BASIC METABOLIC PANEL
Anion gap: 7 (ref 5–15)
BUN: 19 mg/dL (ref 8–23)
CO2: 28 mmol/L (ref 22–32)
Calcium: 10.3 mg/dL (ref 8.9–10.3)
Chloride: 105 mmol/L (ref 98–111)
Creatinine, Ser: 0.8 mg/dL (ref 0.44–1.00)
GFR, Estimated: >60 mL/min (ref >60)
Glucose, Bld: 108 mg/dL — ABNORMAL HIGH (ref 70–99)
Potassium: 3.5 mmol/L (ref 3.5–5.1)
Sodium: 140 mmol/L (ref 135–145)

## 2021-11-03 LAB — CK: Total CK: 216 U/L (ref 38–234)

## 2021-11-03 LAB — GLUCOSE, CAPILLARY: Glucose-Capillary: 106 mg/dL — ABNORMAL HIGH (ref 70–99)

## 2021-11-03 NOTE — NC FL2 (Signed)
[place] LEVEL OF CARE SCREENING TOOL     IDENTIFICATION  Patient Name: [name] Birthdate: Jul 14, 1930 Sex: female Admission Date (Current Location): 11/01/2021  [facility] and [place] Number:  Engineering geologist and Address:  [facility], [address], [place], [place] [address]      Provider Number: [id number]  Attending Physician Name and Address:  Ezekiel Slocumb, DO  Relative Name and Phone Number:  Marthe Patch [phone]    Current Level of Care: Hospital Recommended Level of Care: [place], Memory Care Prior Approval Number:    Date Approved/Denied:   PASRR Number: [id number]  Discharge Plan: Other (Comment) (ALF with hospice)    Current Diagnoses: Patient Active Problem List   Diagnosis Date Noted   Generalized weakness 11/02/2021   Cellulitis of left lower extremity 07/29/2021   Bilateral cellulitis of lower leg 07/27/2021   Depression with anxiety 07/27/2021   Fall 07/27/2021   Chronic kidney disease, stage 3a ([place]) 07/27/2021   COVID-19 virus infection 07/04/2021   Acute hypoxemic respiratory failure due to COVID-19 ([place]) 07/04/2021   Asthma-COPD overlap syndrome ([place]) 07/04/2021   Frequent falls 04/26/2021   Nausea vomiting and diarrhea 02/25/2021   Atrial fibrillation with RVR ([place]) 02/25/2021   Acute renal failure superimposed on stage 3a chronic kidney disease ([place]) 02/25/2021   Chronic diastolic CHF (congestive heart failure) ([place]) 02/25/2021   Severe sepsis ([place]) 02/25/2021   Lower extremity cellulitis 02/25/2021   Allergic rhinitis 08/10/2020   Peripheral neuropathy 08/10/2020   Acute delirium    Hypomagnesemia    Sepsis due to group B Streptococcus without acute organ dysfunction (HCC)    Acute on chronic diastolic CHF (congestive heart failure) (HCC)    Atrial fibrillation, chronic ([place])    Depression    Sepsis secondary to UTI ([place]) 07/05/2020   COPD with acute  exacerbation (HCC)    Hypothyroidism    Hypertension    CKD (chronic kidney disease) stage 3, GFR 30-59 ml/min (HCC)    Chronic kidney disease 06/21/2020   Secondary hyperaldosteronism ([place]) 06/16/2020   Stasis dermatitis of both legs 07/28/2017   Pruritic rash 07/03/2017   Gait instability 05/27/2017   CHF (congestive heart failure) ([place]) 03/27/2017   Lower extremity ulceration, left, limited to breakdown of skin ([place]) 03/27/2017   High risk medication use 03/12/2017   Primary hyperparathyroidism ([place]) 03/12/2017   Impaired glucose tolerance 03/12/2017   Noncompliance 03/12/2017   Parathyroid adenoma 03/12/2017   Chronic renal insufficiency, stage 3 (moderate) ([place]) 03/12/2017   Mild reactive airways disease 03/08/2016   Influenza 03/06/2016   Morbid obesity with BMI of 45.0-49.9, adult ([place]) 02/18/2016   OSA (obstructive sleep apnea) 02/18/2016   CKD (chronic kidney disease), stage II 02/12/2016   Swelling of limb 02/12/2016   Pain in toes of both feet 01/14/2016   Varicose veins of both lower extremities with pain 01/14/2016   Lymphedema 01/14/2016   Longstanding persistent atrial fibrillation ([place]) 10/04/2015   Atrophic vaginitis 12/13/2014   Hypercalcemia 12/13/2014   Hyperparathyroidism ([place]) 12/13/2014   Chronic UTI (urinary tract infection) 11/29/2014   Hyperlipidemia 01/03/2014   Bilateral edema of lower extremity 12/08/2013   SOB (shortness of breath) 12/08/2013   Anxiety 06/15/2013   GERD (gastroesophageal reflux disease) 06/15/2013   Osteoarthritis 06/15/2013   COPD (chronic obstructive pulmonary disease) ([place]) 06/15/2013   Incomplete emptying of bladder 05/25/2013   Mixed incontinence 84/13/2440   Renal colic 12/21/2534   Renal cyst, acquired  05/25/2013   Sciatica 05/25/2013   Chronic cystitis 05/25/2013    Orientation RESPIRATION BLADDER Height & Weight     Self  Normal Incontinent Weight: 131.7 kg Height:  '5\' 4"'$  (162.6 cm)  BEHAVIORAL SYMPTOMS/MOOD  NEUROLOGICAL BOWEL NUTRITION STATUS      Incontinent Diet  AMBULATORY STATUS COMMUNICATION OF NEEDS Skin   Extensive Assist Verbally Bruising                       Personal Care Assistance Level of Assistance  Bathing, Dressing Bathing Assistance: Maximum assistance   Dressing Assistance: Maximum assistance     Functional Limitations Info             SPECIAL CARE FACTORS FREQUENCY                       Contractures Contractures Info: Not present    Additional Factors Info  Code Status, Allergies Code Status Info: Partial Allergies Info: Prednisone Medium Intolerance Other (See Comments) Irritability  Aspirin Low  Nausea Only   Sulfa Antibiotics Low  Rash   Sulfacetamide Sodium Low  Rash             Discharge Medications: Medication List       TAKE these medications     acetaminophen 500 MG tablet Commonly known as: TYLENOL Take 500-1,000 mg by mouth every 6 (six) hours as needed for mild pain or moderate pain.    albuterol 108 (90 Base) MCG/ACT inhaler Commonly known as: VENTOLIN HFA Inhale 2 puffs into the lungs every 6 (six) hours as needed for wheezing or shortness of breath.    albuterol (2.5 MG/3ML) 0.083% nebulizer solution Commonly known as: PROVENTIL Take 2.5 mg by nebulization in the morning and at bedtime.    apixaban 5 MG Tabs tablet Commonly known as: ELIQUIS Take 5 mg by mouth 2 (two) times daily.    benzonatate 100 MG capsule Commonly known as: TESSALON Take 100 mg by mouth 3 (three) times daily as needed for cough.    cyanocobalamin 500 MCG tablet Commonly known as: VITAMIN B12 Take 500 mcg by mouth daily.    DULoxetine 60 MG capsule Commonly known as: CYMBALTA Take 60 mg by mouth daily.    famotidine 20 MG tablet Commonly known as: PEPCID Take 20 mg by mouth at bedtime.    furosemide 20 MG tablet Commonly known as: LASIX Take 20 mg by mouth 2 (two) times daily.    hydrOXYzine 10 MG tablet Commonly known as:  ATARAX Take 10 mg by mouth 2 (two) times daily.    ipratropium 0.02 % nebulizer solution Commonly known as: ATROVENT Take 0.5 mg by nebulization 4 (four) times daily.    levothyroxine 150 MCG tablet Commonly known as: SYNTHROID Take 150 mcg by mouth daily.    lidocaine 4 % 1 patch daily.    loratadine 10 MG tablet Commonly known as: CLARITIN Take 10 mg by mouth daily.    metoprolol succinate 25 MG 24 hr tablet Commonly known as: TOPROL-XL Take 0.5 tablets (12.5 mg total) by mouth at bedtime.    montelukast 10 MG tablet Commonly known as: SINGULAIR Take 10 mg by mouth at bedtime.    nystatin cream Commonly known as: MYCOSTATIN Apply 1 application. topically 3 (three) times daily. (Apply under the breasts)    PreserVision AREDS 2+Multi Vit Caps Take 1 capsule by mouth daily.    traMADol 50 MG tablet Commonly known as: ULTRAM Take  50 mg by mouth every 6 (six) hours as needed.    triamcinolone cream 0.1 % Commonly known as: KENALOG Apply 1 application. topically 2 (two) times daily.      Relevant Imaging Results:  Relevant Lab Results:   Additional Information SS# [id number]  Valente David, RN

## 2021-11-03 NOTE — TOC Transition Note (Addendum)
Transition of Care [facility]) - CM/SW Discharge Note   Patient Details  Name: Pebble Botkin MRN: [id number] Date of Birth: 06/11/1930  Transition of [facility]) CM/SW Contact:  Valente David, RN Phone Number: 11/03/2021, 10:53 AM   Clinical Narrative:     EMS paperwork completed for discharge, daughter aware of plan.  Will go back to [facility] for [place] [facility]ospice, room B11.  Will call EMS when patient ready to be transported.   Update 1109:  Non-emergency [facility] called to transport patient to [facility].  Bedside nurse aware she is next on the list.  Final next level of care: Assisted Living Barriers to Discharge: Barriers Resolved   Patient Goals and CMS Choice Patient states their goals for this hospitalization and ongoing recovery are:: ALF with hospice CMS Medicare.gov Compare Post Acute Care list provided to:: Patient Represenative (must comment) (Daughter, [name]) Choice offered to / list presented to : Adult Children  Discharge Placement   Existing PASRR number confirmed : 11/03/21          Patient [facility] bed at:  [facility]) Patient to be transferred to facility by: [facility] Name of family member notified: [name] Patient and family notified of of transfer: 11/03/21  Discharge Plan and Services     Post Acute Care Choice: Hospice (ALF with hospice)                               Social Determinants of Health (SDOH) Interventions     Readmission Risk Interventions    04/27/2021    1:16 PM  Readmission Risk Prevention Plan  Post Dischage Appt Complete  Medication Screening Complete  Transportation Screening Complete

## 2021-11-03 NOTE — TOC Initial Note (Signed)
Transition of Care [facility]) - Initial/Assessment Note    Patient Details  Name: [name] MRN: [id number] Date of Birth: 1930/06/02  Transition of Care [facility]) CM/SW Contact:    Valente David, RN Phone Number: 11/03/2021, 9:57 AM  Clinical Narrative:                  Spoke with daughter, [name].  Patient admitted from [facility] where she resides for ALF with hospice.  Requesting her to return there.  Aware that per MD, she is medically stable for discharge.  Spoke with Aniceto Boss and Kia at [facility], confirms that patient can return today.  Will complete FL2 and secure EMS non-emergency transport when discharge paperwork is complete.    Expected Discharge Plan: Assisted Living (with hospice) Barriers to Discharge: Barriers Resolved   Patient Goals and CMS Choice Patient states their goals for this hospitalization and ongoing recovery are:: ALF with hospice CMS Medicare.gov Compare Post Acute Care list provided to:: Patient Represenative (must comment) (Daughter [name]) Choice offered to / list presented to : Adult Children  Expected Discharge Plan and Services Expected Discharge Plan: Assisted Living (with hospice)     Post Acute Care Choice: Hospice (ALF with hospice) Living arrangements for the past 2 months: Assisted Living Facility                                      Prior Living Arrangements/Services Living arrangements for the past 2 months: [place] Lives with:: Facility Resident Patient language and need for interpreter reviewed:: Yes Do you feel safe going back to the place where you live?: Yes      Need for Family Participation in Patient Care: Yes (Comment) Care giver support system in place?: Yes (comment) Current home services: DME Criminal Activity/Legal Involvement Pertinent to Current Situation/Hospitalization: No - Comment as needed  Activities of Daily Living Home Assistive Devices/Equipment: Wheelchair ADL Screening  (condition at time of admission) Patient's cognitive ability adequate to safely complete daily activities?: No Is the patient deaf or have difficulty hearing?: No Does the patient have difficulty seeing, even when wearing glasses/contacts?: No Does the patient have difficulty concentrating, remembering, or making decisions?: Yes Patient able to express need for assistance with ADLs?: Yes Does the patient have difficulty dressing or bathing?: Yes Independently performs ADLs?: No Communication: Independent Dressing (OT): Needs assistance Is this a change from baseline?: Pre-admission baseline Grooming: Needs assistance Is this a change from baseline?: Pre-admission baseline Feeding: Independent Bathing: Needs assistance Is this a change from baseline?: Pre-admission baseline Toileting: Needs assistance Is this a change from baseline?: Pre-admission baseline In/Out Bed: Needs assistance Is this a change from baseline?: Pre-admission baseline Walks in Home: Needs assistance Is this a change from baseline?: Pre-admission baseline Does the patient have difficulty walking or climbing stairs?: Yes Weakness of Legs: Both Weakness of Arms/Hands: Both  Permission Sought/Granted Permission sought to share information with : Case Manager Permission granted to share information with : Yes, Verbal Permission Granted  Share Information with NAME: Aniceto Boss  Permission granted to share info w AGENCY: [place]        Emotional Assessment       Orientation: : Oriented to Self   Psych Involvement: No (comment)  Admission diagnosis:  Disorientation [R41.0] Somnolence, daytime [W09.8] Acute metabolic encephalopathy [J19.14] Patient Active Problem List   Diagnosis Date Noted   Generalized weakness 11/02/2021  Acute metabolic encephalopathy 50/27/7412   Elevated CK 11/01/2021   Cellulitis of left lower extremity 07/29/2021   Bilateral cellulitis of lower leg 07/27/2021   Depression with  anxiety 07/27/2021   Fall 07/27/2021   Chronic kidney disease, stage 3a ([place]) 07/27/2021   COVID-19 virus infection 07/04/2021   Acute hypoxemic respiratory failure due to COVID-19 ([place]) 07/04/2021   Asthma-COPD overlap syndrome ([place]) 07/04/2021   Frequent falls 04/26/2021   Nausea vomiting and diarrhea 02/25/2021   Atrial fibrillation with RVR ([place]) 02/25/2021   Acute renal failure superimposed on stage 3a chronic kidney disease ([place]) 02/25/2021   Chronic diastolic CHF (congestive heart failure) ([place]) 02/25/2021   Severe sepsis ([place]) 02/25/2021   Lower extremity cellulitis 02/25/2021   Allergic rhinitis 08/10/2020   Peripheral neuropathy 08/10/2020   Acute delirium    Hypomagnesemia    Sepsis due to group B Streptococcus without acute organ dysfunction (HCC)    Acute on chronic diastolic CHF (congestive heart failure) (HCC)    Atrial fibrillation, chronic ([place])    Depression    Sepsis secondary to UTI ([place]) 07/05/2020   COPD with acute exacerbation (HCC)    Hypothyroidism    Hypertension    CKD (chronic kidney disease) stage 3, GFR 30-59 ml/min (HCC)    Chronic kidney disease 06/21/2020   Secondary hyperaldosteronism ([place]) 06/16/2020   Stasis dermatitis of both legs 07/28/2017   Pruritic rash 07/03/2017   Gait instability 05/27/2017   CHF (congestive heart failure) ([place]) 03/27/2017   Lower extremity ulceration, left, limited to breakdown of skin ([place]) 03/27/2017   High risk medication use 03/12/2017   Primary hyperparathyroidism ([place]) 03/12/2017   Impaired glucose tolerance 03/12/2017   Noncompliance 03/12/2017   Parathyroid adenoma 03/12/2017   Chronic renal insufficiency, stage 3 (moderate) ([place]) 03/12/2017   Mild reactive airways disease 03/08/2016   Influenza 03/06/2016   Morbid obesity with BMI of 45.0-49.9, adult ([place]) 02/18/2016   OSA (obstructive sleep apnea) 02/18/2016   CKD (chronic kidney disease), stage II 02/12/2016   Swelling of limb 02/12/2016   Pain in  toes of both feet 01/14/2016   Varicose veins of both lower extremities with pain 01/14/2016   Lymphedema 01/14/2016   Longstanding persistent atrial fibrillation ([place]) 10/04/2015   Atrophic vaginitis 12/13/2014   Hypercalcemia 12/13/2014   Hyperparathyroidism ([place]) 12/13/2014   Chronic UTI (urinary tract infection) 11/29/2014   Hyperlipidemia 01/03/2014   Bilateral edema of lower extremity 12/08/2013   SOB (shortness of breath) 12/08/2013   Anxiety 06/15/2013   GERD (gastroesophageal reflux disease) 06/15/2013   Osteoarthritis 06/15/2013   COPD (chronic obstructive pulmonary disease) ([place]) 06/15/2013   Incomplete emptying of bladder 05/25/2013   Mixed incontinence 87/86/7672   Renal colic 09/47/0962   Renal cyst, acquired 05/25/2013   Sciatica 05/25/2013   Chronic cystitis 05/25/2013   PCP:  [facility]:   [place] [place] [address] [place] [address] Phone: [phone] Fax: [phone]     Social Determinants of Health (SDOH) Interventions    Readmission Risk Interventions    04/27/2021    1:16 PM  Readmission Risk Prevention Plan  Post Dischage Appt Complete  Medication Screening Complete  Transportation Screening Complete

## 2021-11-03 NOTE — Plan of Care (Signed)
  Problem: Education: Goal: Knowledge of General Education information will improve Description: Including pain rating scale, medication(s)/side effects and non-pharmacologic comfort measures Outcome: Adequate for Discharge   Problem: Health Behavior/Discharge Planning: Goal: Ability to manage health-related needs will improve Outcome: Adequate for Discharge   Problem: Clinical Measurements: Goal: Ability to maintain clinical measurements within normal limits will improve Outcome: Adequate for Discharge Goal: Will remain free from infection Outcome: Adequate for Discharge Goal: Diagnostic test results will improve Outcome: Adequate for Discharge Goal: Respiratory complications will improve Outcome: Adequate for Discharge Goal: Cardiovascular complication will be avoided Outcome: Adequate for Discharge   Problem: Activity: Goal: Risk for activity intolerance will decrease Outcome: Adequate for Discharge   Problem: Nutrition: Goal: Adequate nutrition will be maintained Outcome: Adequate for Discharge   Problem: Coping: Goal: Level of anxiety will decrease Outcome: Adequate for Discharge   Problem: Elimination: Goal: Will not experience complications related to bowel motility Outcome: Adequate for Discharge Goal: Will not experience complications related to urinary retention Outcome: Adequate for Discharge   Problem: Pain Managment: Goal: General experience of comfort will improve Outcome: Adequate for Discharge   Problem: Safety: Goal: Ability to remain free from injury will improve Outcome: Adequate for Discharge   Problem: Skin Integrity: Goal: Risk for impaired skin integrity will decrease Outcome: Adequate for Discharge   Report called to Aya Rn at [facility]. No questions or concerns noted.

## 2021-11-03 NOTE — Discharge Summary (Signed)
Physician Discharge Summary   Patient: [name] MRN: [id number] DOB: May 08, 1930  Admit date:     11/01/2021  Discharge date: 11/03/21  Discharge Physician: Ezekiel Slocumb   PCP: [facility]   Recommendations at discharge:    Follow up with Primary Care  Follow up with Hospice agency Follow up with Neurology as needed Consider trial of magnesium for muscle cramping / spasms  Discharge Diagnoses: Active Problems:   Atrial fibrillation, chronic (HCC)   Chronic diastolic CHF (congestive heart failure) (HCC)   Chronic kidney disease, stage 3a (HCC)   COPD (chronic obstructive pulmonary disease) (HCC)   Hypercalcemia   Hypothyroidism   Hyperparathyroidism ([place])   Hypertension   Morbid obesity with BMI of 45.0-49.9, adult (HCC)   OSA (obstructive sleep apnea)   Depression with anxiety   Generalized weakness  Principal Problem (Resolved):   Acute metabolic encephalopathy Resolved Problems:   Elevated CK  Hospital Course:  [name] is a 88 y.o. female from ALF with medical history significant of COPD not on oxygen, asthma, hypothyroidism, gout, depression, OSA on CPAP, PVD, MRSA, dCHF, atrial fibrillation on Eliquis, CKD-3A, UTI, hyperparathyroidism, hypercalcemia, lymphedema, early stage of dementia, leg cellulitis, who presented on 11/01/2021 for evaluation of altered mental status.  Despite history of dementia, pt reportedly oriented x 3 at baseline, but had become more somnolent and confused.  Daughter reported pt had recently been treated for UTI, and she gets confusion with these.   Pt denied UTI symptoms, CP, cough, congestion, fever/chills, N/V/D or other complaints.    Evaluation in the ED -- pCO2 52 on VBG, normal ammonia level, CK 408, Covid and influenza negative, WBC 10.5k, BNP 144, normal lactate, normal UA, baseline renal function, afebrile.  Chest xray with mild interstitial edema and cardiomegaly, no focal infiltrates.  Admitted to medicine  service for further evaluation and management.     Assessment and Plan: * Acute metabolic encephalopathy-resolved as of 11/03/2021 Etiology is not clear.  Patient moves all extremities, no facial droop or slurred speech.  Low suspicions for stroke.  CT head negative.  Urinalysis negative.  Patient has  mild erythema in both legs, but per her daughter, this is chronic looking for her legs, no warmth, less likely to have cellulitis.   Ammonia level and pCO2 were normal. Differential diagnosis include progression of dementia versus delirium. TSH slightly elevated 4.569  -- Delirium precautions -- Continue neuro checks -- Hydroxyzine was held, okay to resume at d/c but monitor closely for side effects -- Blood culture negative to date -- Follow up pending thiamine level    Elevated CK-resolved as of 11/03/2021 CK level is 408 on admission.  We did not give IVF since pt has history of CHF, had elevated BNP 144, bilateral leg edema, already at risk of acute CHF decompensation Repeat CK improved on its own.  Atrial fibrillation, chronic (HCC) HR controlled --Continue metoprolol, Eliquis  Chronic diastolic CHF (congestive heart failure) ([place]) 2D echo on 07/07/2021 showed EF of 55 to 60%.  Patient has 1+ leg edema, BNP 144, oxygen saturation 93-95% on room air.  Does not seem to have CHF exacerbation, but obviously at high risk of developing CHF exacerbation. -Continue home Lasix 20 mg twice daily  Chronic kidney disease, stage 3a ([place]) Stable renal function.  Creatinine 0.99 -Follow-up with BMP  COPD (chronic obstructive pulmonary disease) (HCC) No wheezing or rhonchi on auscultation. -Bronchodilators  Hypercalcemia Hypercalcinemia and of history of hyperparathyroidism: Calcium 10.4-10.6 which is  baseline for patient. -Follow-up BMP in 1-2 weeks   Hyperparathyroidism ([place]) -see above  Hypothyroidism TSH 4.569, slightly elevated. - Continue Synthroid, will not make dose  adjustment due to her old age 63  Hypertension BP's overall stable --on Metoprolol, Lasix   Morbid obesity with BMI of 45.0-49.9, adult (HCC) Body mass index is 50.71 kg/m. Complicates overall care and prognosis.  Recommend lifestyle modifications including physical activity and diet for weight loss and overall long-term health.    Depression with anxiety -continue Cymbalta -hydroxyzine was held due to altered mental status, okay to resume at discharge  OSA (obstructive sleep apnea) - CPAP  Generalized weakness PT/OT evaluations Fall precautions May require rehab prior to return to ALF         Consultants: None Procedures performed: None   Disposition: Assisted living followed by hospice  Diet recommendation:  Discharge Diet Orders (From admission, onward)     Start     Ordered   11/03/21 0000  Diet - low sodium heart healthy        11/03/21 1009           Cardiac diet  DISCHARGE MEDICATION: Allergies as of 11/03/2021       Reactions   Prednisone Other (See Comments)   Irritability   Aspirin Nausea Only   Sulfa Antibiotics Rash   Sulfacetamide Sodium Rash        Medication List     TAKE these medications    acetaminophen 500 MG tablet Commonly known as: TYLENOL Take 500-1,000 mg by mouth every 6 (six) hours as needed for mild pain or moderate pain.   albuterol 108 (90 Base) MCG/ACT inhaler Commonly known as: VENTOLIN HFA Inhale 2 puffs into the lungs every 6 (six) hours as needed for wheezing or shortness of breath.   albuterol (2.5 MG/3ML) 0.083% nebulizer solution Commonly known as: PROVENTIL Take 2.5 mg by nebulization in the morning and at bedtime.   apixaban 5 MG Tabs tablet Commonly known as: ELIQUIS Take 5 mg by mouth 2 (two) times daily.   benzonatate 100 MG capsule Commonly known as: TESSALON Take 100 mg by mouth 3 (three) times daily as needed for cough.   cyanocobalamin 500 MCG tablet Commonly known as: VITAMIN  B12 Take 500 mcg by mouth daily.   DULoxetine 60 MG capsule Commonly known as: CYMBALTA Take 60 mg by mouth daily.   famotidine 20 MG tablet Commonly known as: PEPCID Take 20 mg by mouth at bedtime.   furosemide 20 MG tablet Commonly known as: LASIX Take 20 mg by mouth 2 (two) times daily.   hydrOXYzine 10 MG tablet Commonly known as: ATARAX Take 10 mg by mouth 2 (two) times daily.   ipratropium 0.02 % nebulizer solution Commonly known as: ATROVENT Take 0.5 mg by nebulization 4 (four) times daily.   levothyroxine 150 MCG tablet Commonly known as: SYNTHROID Take 150 mcg by mouth daily.   lidocaine 4 % 1 patch daily.   loratadine 10 MG tablet Commonly known as: CLARITIN Take 10 mg by mouth daily.   metoprolol succinate 25 MG 24 hr tablet Commonly known as: TOPROL-XL Take 0.5 tablets (12.5 mg total) by mouth at bedtime.   montelukast 10 MG tablet Commonly known as: SINGULAIR Take 10 mg by mouth at bedtime.   nystatin cream Commonly known as: MYCOSTATIN Apply 1 application. topically 3 (three) times daily. (Apply under the breasts)   PreserVision AREDS 2+Multi Vit Caps Take 1 capsule by mouth daily.   traMADol  50 MG tablet Commonly known as: ULTRAM Take 50 mg by mouth every 6 (six) hours as needed.   triamcinolone cream 0.1 % Commonly known as: KENALOG Apply 1 application. topically 2 (two) times daily.        Discharge Exam: Filed Weights   11/01/21 1258 11/02/21 0500 11/03/21 0500  Weight: 129.3 kg 134 kg 131.7 kg   General exam: awake, alert, no acute distress, obese, conversational HEENT: atraumatic, clear conjunctiva, anicteric sclera, moist mucus membranes, hearing grossly normal  Respiratory system: CTAB diminished due to body habitus, no wheezes, rales or rhonchi, normal respiratory effort. Cardiovascular system: normal S1/S2, RRR, trace lower extremity edema.   Gastrointestinal system: soft, NT, ND, no HSM felt, +bowel sounds. Central  nervous system: A&O x3. no gross focal neurologic deficits, normal speech Extremities: moves all, no edema, normal tone Skin: dry, intact, normal temperature Psychiatry: normal mood, congruent affect, judgement and insight appear normal   Condition at discharge: stable  The results of significant diagnostics from this hospitalization (including imaging, microbiology, ancillary and laboratory) are listed below for reference.   Imaging Studies: CT Head Wo Contrast  Result Date: 11/01/2021 CLINICAL DATA:  Delirium EXAM: CT HEAD WITHOUT CONTRAST TECHNIQUE: Contiguous axial images were obtained from the base of the skull through the vertex without intravenous contrast. RADIATION DOSE REDUCTION: This exam was performed according to the departmental dose-optimization program which includes automated exposure control, adjustment of the mA and/or kV according to patient size and/or use of iterative reconstruction technique. COMPARISON:  CT 07/27/2021 FINDINGS: Brain: No evidence of acute intracranial hemorrhage or extra-axial collection. No loss of gray-white matter differentiation.No evidence of mass lesion/concerning mass effect.The ventricles are normal in size.Scattered subcortical and periventricular white matter hypodensities, nonspecific but likely sequela of chronic small vessel ischemic disease.Mild cerebral atrophy Vascular: No hyperdense vessel or unexpected calcification. Skull: Mild hyperostosis frontalis internus. Negative for skull fracture. Sinuses/Orbits: No acute findings. Other: None. IMPRESSION: No acute intracranial abnormality. Unchanged atrophy and mild chronic small vessel ischemic change. Electronically Signed   By: Maurine Simmering M.D.   On: 11/01/2021 14:13   DG Chest 1 View  Result Date: 11/01/2021 CLINICAL DATA:  Altered mental status EXAM: CHEST  1 VIEW COMPARISON:  Previous studies including the examination of 09/01/2021 FINDINGS: Transverse diameter of heart is increased. Apparent  shift of mediastinum to the right may be due to rotation. Central pulmonary vessels are prominent without signs of alveolar pulmonary edema. There is no focal pulmonary consolidation. Interstitial markings in the parahilar regions appear prominent. There is no pleural effusion or pneumothorax. IMPRESSION: Cardiomegaly. Central pulmonary vessels are prominent without signs of alveolar pulmonary edema. There is prominence of interstitial markings in the parahilar regions which may suggest mild interstitial edema or interstitial pneumonia. There is no focal pulmonary consolidation. Electronically Signed   By: Elmer Picker M.D.   On: 11/01/2021 13:31    Microbiology: Results for orders placed or performed during the hospital encounter of 11/01/21  Resp Panel by RT-PCR (Flu A&B, Covid) Anterior Nasal Swab     Status: None   Collection Time: 11/01/21  1:19 PM   Specimen: Anterior Nasal Swab  Result Value Ref Range Status   SARS Coronavirus 2 by RT PCR NEGATIVE NEGATIVE Final    Comment: (NOTE) SARS-CoV-2 target nucleic acids are NOT DETECTED.  The SARS-CoV-2 RNA is generally detectable in upper respiratory specimens during the acute phase of infection. The lowest concentration of SARS-CoV-2 viral copies this assay can detect is 138 copies/mL. A  negative result does not preclude SARS-Cov-2 infection and should not be used as the sole basis for treatment or other patient management decisions. A negative result may occur with  improper specimen collection/handling, submission of specimen other than nasopharyngeal swab, presence of viral mutation(s) within the areas targeted by this assay, and inadequate number of viral copies(<138 copies/mL). A negative result must be combined with clinical observations, patient history, and epidemiological information. The expected result is Negative.  Fact Sheet for Patients:  EntrepreneurPulse.com.au  Fact Sheet for Healthcare Providers:   IncredibleEmployment.be  This test is no t yet approved or cleared by the [place] FDA and  has been authorized for detection and/or diagnosis of SARS-CoV-2 by FDA under an Emergency Use Authorization (EUA). This EUA will remain  in effect (meaning this test can be used) for the duration of the COVID-19 declaration under Section 564(b)(1) of the Act, 21 U.S.C.section 360bbb-3(b)(1), unless the authorization is terminated  or revoked sooner.       Influenza A by PCR NEGATIVE NEGATIVE Final   Influenza B by PCR NEGATIVE NEGATIVE Final    Comment: (NOTE) The Xpert Xpress SARS-CoV-2/FLU/RSV plus assay is intended as an aid in the diagnosis of influenza from Nasopharyngeal swab specimens and should not be used as a sole basis for treatment. Nasal washings and aspirates are unacceptable for Xpert Xpress SARS-CoV-2/FLU/RSV testing.  Fact Sheet for Patients: EntrepreneurPulse.com.au  Fact Sheet for Healthcare Providers: IncredibleEmployment.be  This test is not yet approved or cleared by the [place] FDA and has been authorized for detection and/or diagnosis of SARS-CoV-2 by FDA under an Emergency Use Authorization (EUA). This EUA will remain in effect (meaning this test can be used) for the duration of the COVID-19 declaration under Section 564(b)(1) of the Act, 21 U.S.C. section 360bbb-3(b)(1), unless the authorization is terminated or revoked.  Performed at [facility], [address] [place], [place] [address]   Blood culture (routine x 2)     Status: None (Preliminary result)   Collection Time: 11/01/21  4:06 PM   Specimen: BLOOD  Result Value Ref Range Status   Specimen Description BLOOD RIGHT ANTECUBITAL  Final   Special Requests   Final    BOTTLES DRAWN AEROBIC AND ANAEROBIC Blood Culture adequate volume   Culture   Final    NO GROWTH 2 DAYS Performed at [facility], [address] [place], [place] [address]    Report Status PENDING  Incomplete  MRSA Next Gen by PCR, Nasal     Status: None   Collection Time: 11/01/21  6:14 PM   Specimen: Nasal Mucosa; Nasal Swab  Result Value Ref Range Status   MRSA by PCR Next Gen NOT DETECTED NOT DETECTED Final    Comment: (NOTE) The GeneXpert MRSA Assay (FDA approved for NASAL specimens only), is one component of a comprehensive MRSA colonization surveillance program. It is not intended to diagnose MRSA infection nor to guide or monitor treatment for MRSA infections. Test performance is not FDA approved in patients less than 11 years old. Performed at [facility], [address] [place], [place] [address]   Culture, blood (Routine X 2) w Reflex to ID Panel     Status: None (Preliminary result)   Collection Time: 11/01/21  6:47 PM   Specimen: BLOOD LEFT HAND  Result Value Ref Range Status   Specimen Description BLOOD LEFT HAND  Final   Special Requests   Final    BOTTLES DRAWN AEROBIC AND ANAEROBIC  Blood Culture results may not be optimal due to an inadequate volume of blood received in culture bottles   Culture   Final    NO GROWTH 2 DAYS Performed at [facility], [address] [place], [place] [address]    Report Status PENDING  Incomplete    Labs: CBC: Recent Labs  Lab 11/01/21 1236 11/02/21 0554 11/03/21 0554  WBC 10.5 10.4 10.0  HGB 11.6* 10.8* 11.0*  HCT 36.7 34.1* 34.6*  MCV 85.5 85.3 84.0  PLT 256 243 449   Basic Metabolic Panel: Recent Labs  Lab 11/01/21 1236 11/02/21 0554 11/03/21 0554  NA 138 138 140  K 3.8 3.6 3.5  CL 103 104 105  CO2 '27 26 28  '$ GLUCOSE 128* 104* 108*  BUN 25* 23 19  CREATININE 0.99 0.87 0.80  CALCIUM 10.4* 10.6* 10.3   Liver Function Tests: Recent Labs  Lab 11/01/21 1236  AST 29  ALT 13  ALKPHOS 74  BILITOT 0.9  PROT 7.1  ALBUMIN 3.7   CBG: Recent Labs  Lab 11/01/21 1249 11/02/21 0818 11/03/21 0821  GLUCAP 106* 96 106*     Discharge time spent: greater than 30 minutes.  Signed: Ezekiel Slocumb, DO [facility] 11/03/2021

## 2021-11-04 ENCOUNTER — Encounter (INDEPENDENT_AMBULATORY_CARE_PROVIDER_SITE_OTHER): Payer: Medicare HMO

## 2021-11-04 ENCOUNTER — Ambulatory Visit (INDEPENDENT_AMBULATORY_CARE_PROVIDER_SITE_OTHER): Payer: Medicare HMO | Admitting: Nurse Practitioner

## 2021-11-06 LAB — CULTURE, BLOOD (ROUTINE X 2)
Culture: NO GROWTH
Culture: NO GROWTH
Special Requests: ADEQUATE

## 2021-11-06 LAB — VITAMIN B1: Vitamin B1 (Thiamine): 133.8 nmol/L (ref 66.5–200.0)

## 2021-11-08 ENCOUNTER — Ambulatory Visit (INDEPENDENT_AMBULATORY_CARE_PROVIDER_SITE_OTHER): Payer: Medicare HMO | Admitting: Nurse Practitioner

## 2021-11-08 ENCOUNTER — Encounter (INDEPENDENT_AMBULATORY_CARE_PROVIDER_SITE_OTHER): Payer: Medicare HMO

## 2021-11-27 ENCOUNTER — Other Ambulatory Visit: Payer: Self-pay

## 2021-11-27 ENCOUNTER — Inpatient Hospital Stay: Payer: Medicare Other

## 2021-11-27 ENCOUNTER — Emergency Department: Payer: Medicare Other

## 2021-11-27 ENCOUNTER — Inpatient Hospital Stay
Admission: EM | Admit: 2021-11-27 | Discharge: 2021-12-02 | DRG: 291 | Disposition: A | Discharge status: Hospice | Payer: Medicare Other | Source: Skilled Nursing Facility | Attending: Osteopathic Medicine | Admitting: Osteopathic Medicine

## 2021-11-27 DIAGNOSIS — J45901 Unspecified asthma with (acute) exacerbation: Secondary | ICD-10-CM | POA: Diagnosis present

## 2021-11-27 DIAGNOSIS — F32A Depression, unspecified: Secondary | ICD-10-CM | POA: Diagnosis present

## 2021-11-27 DIAGNOSIS — I10 Essential (primary) hypertension: Secondary | ICD-10-CM | POA: Diagnosis not present

## 2021-11-27 DIAGNOSIS — N179 Acute kidney failure, unspecified: Secondary | ICD-10-CM | POA: Diagnosis present

## 2021-11-27 DIAGNOSIS — G4733 Obstructive sleep apnea (adult) (pediatric): Secondary | ICD-10-CM | POA: Diagnosis present

## 2021-11-27 DIAGNOSIS — E039 Hypothyroidism, unspecified: Secondary | ICD-10-CM | POA: Diagnosis present

## 2021-11-27 DIAGNOSIS — F0393 Unspecified dementia, unspecified severity, with mood disturbance: Secondary | ICD-10-CM | POA: Diagnosis present

## 2021-11-27 DIAGNOSIS — J9601 Acute respiratory failure with hypoxia: Secondary | ICD-10-CM | POA: Diagnosis present

## 2021-11-27 DIAGNOSIS — F418 Other specified anxiety disorders: Secondary | ICD-10-CM

## 2021-11-27 DIAGNOSIS — Z961 Presence of intraocular lens: Secondary | ICD-10-CM | POA: Diagnosis present

## 2021-11-27 DIAGNOSIS — I5033 Acute on chronic diastolic (congestive) heart failure: Secondary | ICD-10-CM | POA: Diagnosis present

## 2021-11-27 DIAGNOSIS — Z79890 Hormone replacement therapy (postmenopausal): Secondary | ICD-10-CM

## 2021-11-27 DIAGNOSIS — Z6841 Body Mass Index (BMI) 40.0 and over, adult: Secondary | ICD-10-CM | POA: Diagnosis not present

## 2021-11-27 DIAGNOSIS — G9341 Metabolic encephalopathy: Secondary | ICD-10-CM | POA: Diagnosis present

## 2021-11-27 DIAGNOSIS — I130 Hypertensive heart and chronic kidney disease with heart failure and stage 1 through stage 4 chronic kidney disease, or unspecified chronic kidney disease: Principal | ICD-10-CM | POA: Diagnosis present

## 2021-11-27 DIAGNOSIS — E213 Hyperparathyroidism, unspecified: Secondary | ICD-10-CM | POA: Diagnosis present

## 2021-11-27 DIAGNOSIS — J441 Chronic obstructive pulmonary disease with (acute) exacerbation: Secondary | ICD-10-CM | POA: Diagnosis present

## 2021-11-27 DIAGNOSIS — Z9841 Cataract extraction status, right eye: Secondary | ICD-10-CM

## 2021-11-27 DIAGNOSIS — Z7901 Long term (current) use of anticoagulants: Secondary | ICD-10-CM | POA: Diagnosis not present

## 2021-11-27 DIAGNOSIS — Z882 Allergy status to sulfonamides status: Secondary | ICD-10-CM

## 2021-11-27 DIAGNOSIS — Z8614 Personal history of Methicillin resistant Staphylococcus aureus infection: Secondary | ICD-10-CM

## 2021-11-27 DIAGNOSIS — Z886 Allergy status to analgesic agent status: Secondary | ICD-10-CM

## 2021-11-27 DIAGNOSIS — Z9842 Cataract extraction status, left eye: Secondary | ICD-10-CM

## 2021-11-27 DIAGNOSIS — N1831 Chronic kidney disease, stage 3a: Secondary | ICD-10-CM | POA: Diagnosis present

## 2021-11-27 DIAGNOSIS — Z515 Encounter for palliative care: Secondary | ICD-10-CM | POA: Diagnosis not present

## 2021-11-27 DIAGNOSIS — Z66 Do not resuscitate: Secondary | ICD-10-CM | POA: Diagnosis present

## 2021-11-27 DIAGNOSIS — I509 Heart failure, unspecified: Secondary | ICD-10-CM

## 2021-11-27 DIAGNOSIS — Z9049 Acquired absence of other specified parts of digestive tract: Secondary | ICD-10-CM

## 2021-11-27 DIAGNOSIS — I959 Hypotension, unspecified: Secondary | ICD-10-CM | POA: Diagnosis present

## 2021-11-27 DIAGNOSIS — R0602 Shortness of breath: Secondary | ICD-10-CM | POA: Diagnosis present

## 2021-11-27 DIAGNOSIS — I739 Peripheral vascular disease, unspecified: Secondary | ICD-10-CM | POA: Diagnosis present

## 2021-11-27 DIAGNOSIS — Z888 Allergy status to other drugs, medicaments and biological substances status: Secondary | ICD-10-CM

## 2021-11-27 DIAGNOSIS — Z789 Other specified health status: Secondary | ICD-10-CM | POA: Diagnosis not present

## 2021-11-27 DIAGNOSIS — R0902 Hypoxemia: Secondary | ICD-10-CM

## 2021-11-27 DIAGNOSIS — F0394 Unspecified dementia, unspecified severity, with anxiety: Secondary | ICD-10-CM | POA: Diagnosis present

## 2021-11-27 DIAGNOSIS — E892 Postprocedural hypoparathyroidism: Secondary | ICD-10-CM | POA: Diagnosis present

## 2021-11-27 DIAGNOSIS — I482 Chronic atrial fibrillation, unspecified: Secondary | ICD-10-CM | POA: Diagnosis present

## 2021-11-27 DIAGNOSIS — Z8619 Personal history of other infectious and parasitic diseases: Secondary | ICD-10-CM

## 2021-11-27 DIAGNOSIS — K219 Gastro-esophageal reflux disease without esophagitis: Secondary | ICD-10-CM | POA: Diagnosis present

## 2021-11-27 DIAGNOSIS — Z20822 Contact with and (suspected) exposure to covid-19: Secondary | ICD-10-CM | POA: Diagnosis present

## 2021-11-27 DIAGNOSIS — M109 Gout, unspecified: Secondary | ICD-10-CM | POA: Diagnosis present

## 2021-11-27 DIAGNOSIS — Z87440 Personal history of urinary (tract) infections: Secondary | ICD-10-CM

## 2021-11-27 DIAGNOSIS — R0600 Dyspnea, unspecified: Principal | ICD-10-CM

## 2021-11-27 DIAGNOSIS — M1990 Unspecified osteoarthritis, unspecified site: Secondary | ICD-10-CM | POA: Diagnosis present

## 2021-11-27 DIAGNOSIS — Z79899 Other long term (current) drug therapy: Secondary | ICD-10-CM | POA: Diagnosis not present

## 2021-11-27 LAB — COMPREHENSIVE METABOLIC PANEL
ALT: 10 U/L (ref 0–44)
AST: 17 U/L (ref 15–41)
Albumin: 3.5 g/dL (ref 3.5–5.0)
Alkaline Phosphatase: 81 U/L (ref 38–126)
Anion gap: 3 — ABNORMAL LOW (ref 5–15)
BUN: 23 mg/dL (ref 8–23)
CO2: 32 mmol/L (ref 22–32)
Calcium: 10.5 mg/dL — ABNORMAL HIGH (ref 8.9–10.3)
Chloride: 105 mmol/L (ref 98–111)
Creatinine, Ser: 1.05 mg/dL — ABNORMAL HIGH (ref 0.44–1.00)
GFR, Estimated: 51 mL/min — ABNORMAL LOW (ref >60)
Glucose, Bld: 126 mg/dL — ABNORMAL HIGH (ref 70–99)
Potassium: 4.4 mmol/L (ref 3.5–5.1)
Sodium: 140 mmol/L (ref 135–145)
Total Bilirubin: 0.6 mg/dL (ref 0.3–1.2)
Total Protein: 7.1 g/dL (ref 6.5–8.1)

## 2021-11-27 LAB — URINALYSIS, COMPLETE (UACMP) WITH MICROSCOPIC
Bilirubin Urine: NEGATIVE
Glucose, UA: NEGATIVE mg/dL
Hgb urine dipstick: NEGATIVE
Ketones, ur: NEGATIVE mg/dL
Leukocytes,Ua: NEGATIVE
Nitrite: NEGATIVE
Protein, ur: NEGATIVE mg/dL
Specific Gravity, Urine: 1.018 (ref 1.005–1.030)
pH: 5 (ref 5.0–8.0)

## 2021-11-27 LAB — CBC
HCT: 37.2 % (ref 36.0–46.0)
Hemoglobin: 11.5 g/dL — ABNORMAL LOW (ref 12.0–15.0)
MCH: 26.8 pg (ref 26.0–34.0)
MCHC: 30.9 g/dL (ref 30.0–36.0)
MCV: 86.7 fL (ref 80.0–100.0)
Platelets: 234 10*3/uL (ref 150–400)
RBC: 4.29 MIL/uL (ref 3.87–5.11)
RDW: 15.3 % (ref 11.5–15.5)
WBC: 11.9 10*3/uL — ABNORMAL HIGH (ref 4.0–10.5)
nRBC: 0 % (ref 0.0–0.2)

## 2021-11-27 LAB — BRAIN NATRIURETIC PEPTIDE: B Natriuretic Peptide: 129.7 pg/mL — ABNORMAL HIGH (ref 0.0–100.0)

## 2021-11-27 LAB — TROPONIN I (HIGH SENSITIVITY): Troponin I (High Sensitivity): 13 ng/L (ref <18)

## 2021-11-27 LAB — RESP PANEL BY RT-PCR (FLU A&B, COVID) ARPGX2
Influenza A by PCR: NEGATIVE
Influenza B by PCR: NEGATIVE
SARS Coronavirus 2 by RT PCR: NEGATIVE

## 2021-11-27 LAB — MAGNESIUM: Magnesium: 2.1 mg/dL (ref 1.7–2.4)

## 2021-11-27 LAB — AMMONIA: Ammonia: 10 umol/L (ref 9–35)

## 2021-11-27 MED ORDER — FAMOTIDINE 20 MG PO TABS
20.0000 mg | ORAL_TABLET | Freq: Every day | ORAL | Status: DC
  Administered 2021-11-28 – 2021-12-01 (×4): 20 mg via ORAL
  Filled 2021-11-27 (×4): qty 1

## 2021-11-27 MED ORDER — OCUVITE-LUTEIN PO CAPS
1.0000 | ORAL_CAPSULE | Freq: Every day | ORAL | Status: DC
  Administered 2021-11-28 – 2021-12-02 (×5): 1 via ORAL
  Filled 2021-11-27 (×6): qty 1

## 2021-11-27 MED ORDER — HYDRALAZINE HCL 20 MG/ML IJ SOLN: 5.0000 mg | INTRAMUSCULAR | Status: DC | PRN

## 2021-11-27 MED ORDER — HALOPERIDOL LACTATE 5 MG/ML IJ SOLN
2.0000 mg | Freq: Once | INTRAMUSCULAR | Status: AC
  Administered 2021-11-27: 2 mg via INTRAVENOUS
  Filled 2021-11-27: qty 1

## 2021-11-27 MED ORDER — RISPERIDONE 0.5 MG PO TABS
0.5000 mg | ORAL_TABLET | Freq: Two times a day (BID) | ORAL | Status: DC
  Administered 2021-11-28 – 2021-12-02 (×9): 0.5 mg via ORAL
  Filled 2021-11-27 (×11): qty 1

## 2021-11-27 MED ORDER — IPRATROPIUM-ALBUTEROL 0.5-2.5 (3) MG/3ML IN SOLN
3.0000 mL | Freq: Four times a day (QID) | RESPIRATORY_TRACT | Status: DC
  Administered 2021-11-28 – 2021-12-01 (×14): 3 mL via RESPIRATORY_TRACT
  Filled 2021-11-27 (×14): qty 3

## 2021-11-27 MED ORDER — ACETAMINOPHEN 650 MG RE SUPP: 650.0000 mg | Freq: Four times a day (QID) | RECTAL | Status: DC | PRN

## 2021-11-27 MED ORDER — FUROSEMIDE 10 MG/ML IJ SOLN
60.0000 mg | Freq: Two times a day (BID) | INTRAMUSCULAR | Status: DC
  Administered 2021-11-27 – 2021-11-28 (×3): 60 mg via INTRAVENOUS
  Filled 2021-11-27 (×4): qty 6

## 2021-11-27 MED ORDER — METOPROLOL SUCCINATE ER 25 MG PO TB24
12.5000 mg | ORAL_TABLET | Freq: Every day | ORAL | Status: DC
  Administered 2021-11-28 – 2021-12-01 (×4): 12.5 mg via ORAL
  Filled 2021-11-27: qty 0.5
  Filled 2021-11-27 (×4): qty 1

## 2021-11-27 MED ORDER — IPRATROPIUM-ALBUTEROL 0.5-2.5 (3) MG/3ML IN SOLN
3.0000 mL | RESPIRATORY_TRACT | Status: DC
  Administered 2021-11-27 (×3): 3 mL via RESPIRATORY_TRACT
  Filled 2021-11-27 (×3): qty 3

## 2021-11-27 MED ORDER — LORATADINE 10 MG PO TABS
10.0000 mg | ORAL_TABLET | Freq: Every day | ORAL | Status: DC
  Administered 2021-11-28 – 2021-12-02 (×5): 10 mg via ORAL
  Filled 2021-11-27 (×5): qty 1

## 2021-11-27 MED ORDER — IPRATROPIUM-ALBUTEROL 0.5-2.5 (3) MG/3ML IN SOLN
3.0000 mL | Freq: Once | RESPIRATORY_TRACT | Status: AC
  Administered 2021-11-27: 3 mL via RESPIRATORY_TRACT
  Filled 2021-11-27: qty 3

## 2021-11-27 MED ORDER — FUROSEMIDE 10 MG/ML IJ SOLN
60.0000 mg | Freq: Once | INTRAMUSCULAR | Status: AC
Start: 2021-11-27 — End: 2021-11-27
  Administered 2021-11-27: 60 mg via INTRAVENOUS
  Filled 2021-11-27: qty 8

## 2021-11-27 MED ORDER — DULOXETINE HCL 30 MG PO CPEP
60.0000 mg | ORAL_CAPSULE | Freq: Every day | ORAL | Status: DC
  Administered 2021-11-28 – 2021-12-02 (×5): 60 mg via ORAL
  Filled 2021-11-27 (×5): qty 2

## 2021-11-27 MED ORDER — LEVOTHYROXINE SODIUM 150 MCG PO TABS
150.0000 ug | ORAL_TABLET | Freq: Every day | ORAL | Status: DC
  Administered 2021-11-29 – 2021-12-02 (×4): 150 ug via ORAL
  Filled 2021-11-27 (×5): qty 1

## 2021-11-27 MED ORDER — ONDANSETRON HCL 4 MG/2ML IJ SOLN: 4.0000 mg | Freq: Three times a day (TID) | INTRAMUSCULAR | Status: DC | PRN

## 2021-11-27 MED ORDER — TRAMADOL HCL 50 MG PO TABS
50.0000 mg | ORAL_TABLET | Freq: Four times a day (QID) | ORAL | Status: DC | PRN
  Administered 2021-12-01: 50 mg via ORAL
  Filled 2021-11-27 (×2): qty 1

## 2021-11-27 MED ORDER — NYSTATIN 100000 UNIT/GM EX CREA
1.0000 | TOPICAL_CREAM | Freq: Three times a day (TID) | CUTANEOUS | Status: DC
  Administered 2021-11-27 – 2021-12-02 (×14): 1 via TOPICAL
  Filled 2021-11-27: qty 30

## 2021-11-27 MED ORDER — TRIAMCINOLONE ACETONIDE 0.1 % EX CREA
1.0000 | TOPICAL_CREAM | Freq: Two times a day (BID) | CUTANEOUS | Status: DC
  Administered 2021-11-28: 1 via TOPICAL
  Filled 2021-11-27: qty 15

## 2021-11-27 MED ORDER — MONTELUKAST SODIUM 10 MG PO TABS
10.0000 mg | ORAL_TABLET | Freq: Every day | ORAL | Status: DC
  Administered 2021-11-28 – 2021-12-01 (×4): 10 mg via ORAL
  Filled 2021-11-27 (×4): qty 1

## 2021-11-27 MED ORDER — APIXABAN 5 MG PO TABS: 5.0000 mg | ORAL_TABLET | Freq: Two times a day (BID) | ORAL | Status: DC

## 2021-11-27 MED ORDER — ALBUTEROL SULFATE (2.5 MG/3ML) 0.083% IN NEBU: 2.5000 mg | INHALATION_SOLUTION | RESPIRATORY_TRACT | Status: DC | PRN

## 2021-11-27 MED ORDER — PANTOPRAZOLE SODIUM 40 MG PO TBEC
40.0000 mg | DELAYED_RELEASE_TABLET | Freq: Every day | ORAL | Status: DC
  Administered 2021-11-28 – 2021-12-02 (×4): 40 mg via ORAL
  Filled 2021-11-27 (×5): qty 1

## 2021-11-27 MED ORDER — VITAMIN B-12 1000 MCG PO TABS
500.0000 ug | ORAL_TABLET | Freq: Every day | ORAL | Status: DC
  Administered 2021-11-28 – 2021-12-02 (×5): 500 ug via ORAL
  Filled 2021-11-27 (×5): qty 1

## 2021-11-27 MED ORDER — ACETAMINOPHEN 325 MG PO TABS
650.0000 mg | ORAL_TABLET | Freq: Four times a day (QID) | ORAL | Status: DC | PRN
  Administered 2021-11-28 – 2021-12-02 (×5): 650 mg via ORAL
  Filled 2021-11-27 (×5): qty 2

## 2021-11-27 MED ORDER — LIDOCAINE 5 % EX PTCH
1.0000 | MEDICATED_PATCH | Freq: Every day | CUTANEOUS | Status: DC
  Administered 2021-11-27 – 2021-12-01 (×4): 1 via TRANSDERMAL
  Filled 2021-11-27 (×6): qty 1

## 2021-11-27 MED ORDER — DM-GUAIFENESIN ER 30-600 MG PO TB12
1.0000 | ORAL_TABLET | Freq: Two times a day (BID) | ORAL | Status: DC | PRN
  Administered 2021-11-29: 1 via ORAL
  Filled 2021-11-27 (×2): qty 1

## 2021-11-27 NOTE — Assessment & Plan Note (Signed)
Heart rate 99 -Continue Eliquis -Continue metoprolol

## 2021-11-27 NOTE — Assessment & Plan Note (Signed)
-   IV hydralazine as needed -Patient is on IV Lasix -Metoprolol

## 2021-11-27 NOTE — Progress Notes (Signed)
Patient alert to voice and touch. Disoriented x3, nods with asking name. Unable to verbalize and follow commands, history of dementia. BiPap in place at 35%. BP hypotensive. Displays no s/s of distress, pain, and respiratory demise. Rhonchi and coarse crackles bilaterally. Partial code, RR RN informed and will perform MEWs protocol and round often. Unable to safely take PO meds or fluids. Purewick in place. BiPap in place, no leak. Patient compliant with lines, safety and mask. Will inform provider on call of NPO status and change routes of medications if appropriate. Will continue to monitor.   11/27/21 1945  Assess: MEWS Score  Temp 97.8 F (36.6 C)  BP (!) 122/46  MAP (mmHg) 69  Pulse Rate 83  ECG Heart Rate 90  Resp 12  SpO2 100 %  O2 Device Bi-PAP  FiO2 (%) 35 %  Assess: MEWS Score  MEWS Temp 0  MEWS Systolic 0  MEWS Pulse 0  MEWS RR 1  MEWS LOC 1  MEWS Score 2  MEWS Score Color Yellow  Assess: if the MEWS score is Yellow or Red  Were vital signs taken at a resting state? Yes  Focused Assessment No change from prior assessment  Does the patient meet 2 or more of the SIRS criteria? No  MEWS guidelines implemented *See Row Information* Yes (Yellow MEWs in ED, will proceed wiht Mews protocol on unit)  Treat  MEWS Interventions Other (Comment) (follow protocol, BIPAP in place, frequent rounding)  Pain Scale PAINAD  Pain Score 0  Breathing 0  Negative Vocalization 0  Facial Expression 0  Body Language 0  Consolability 0  PAINAD Score 0  Take Vital Signs  Increase Vital Sign Frequency  Yellow: Q 2hr X 2 then Q 4hr X 2, if remains yellow, continue Q 4hrs  Escalate  MEWS: Escalate Yellow: discuss with charge nurse/RN and consider discussing with provider and RRT  Notify: Charge Nurse/RN  Name of Charge Nurse/RN Notified Geologist, engineering  Date Charge Nurse/RN Notified 11/27/21  Time Charge Nurse/RN Notified 1945  Document  Patient Outcome Other (Comment) (patient stable, alert  to voice, bipap in place)  Progress note created (see row info) Yes  Assess: SIRS CRITERIA  SIRS Temperature  0  SIRS Pulse 0  SIRS Respirations  0  SIRS WBC 1  SIRS Score Sum  1

## 2021-11-27 NOTE — Assessment & Plan Note (Signed)
Etiology is not clear.  Urinalysis negative.  Potential differential diagnosis include delirium, hypoxia, progression of dementia.  Since patient is on Eliquis, will get CT of head to rule out any acute injury. -Recurrent neurochecks -Follow-up CT of head --> negative

## 2021-11-27 NOTE — Assessment & Plan Note (Signed)
Hypercalcemia due to hyperparathyroidism: Calcium slightly elevated 10.5. -Follow up with BMP

## 2021-11-27 NOTE — Assessment & Plan Note (Signed)
Synthroid 

## 2021-11-27 NOTE — Progress Notes (Signed)
Provider made aware of MEWs. Provider notified of inability to safely administer patient's PO meds. She is lethargic and disoriented. Patient is on continuous Bipap. Patient is at high risk for aspiration. PO meds held. Will continue to monitor.

## 2021-11-27 NOTE — Assessment & Plan Note (Signed)
See above

## 2021-11-27 NOTE — Assessment & Plan Note (Signed)
-  Bronchodilators -Will not give steroids since patient is allergic to prednisone -As needed Mucinex -Incentive spirometry

## 2021-11-27 NOTE — Assessment & Plan Note (Signed)
-   Patient is on BiPAP currently -Restart CPAP when patient is off BiPAP

## 2021-11-27 NOTE — ED Provider Notes (Signed)
[facility] Provider Note    Event Date/Time   First MD Initiated Contact with Patient 11/27/21 1001     (approximate)  History   Chief Complaint: Shortness of Breath (Hospice pt, endstage COPD and CHF. Altered since yesterday. +crackles + wheezing +increased WOB Strong smell of urine noted. MOST form at bedside)  HPI  [name] is a 88 y.o. female with a past medical history of asthma, CHF, COPD, dementia, hypertension presents to the emergency department for altered mental status and worsening shortness of breath.  According to EMS patient is coming from her nursing facility has a history of dementia but they have noted over the past 2 days worsening of her mental state confusion and less responsive.  They also state the patient appears to be more short of breath has audible expiratory wheeze and increased lower extremity swelling.  He also states per report a strong urine smell.  Patient has a MOST form with her that states limited scope of care but full resuscitation.  Physical Exam   Triage Vital Signs: ED Triage Vitals [11/27/21 1007]  Enc Vitals Group     BP (!) 146/81     Pulse Rate 91     Resp (!) 21     Temp (!) 97.5 F (36.4 C)     Temp Source Axillary     SpO2 94 %     Weight      Height      Head Circumference      Peak Flow      Pain Score      Pain Loc      Pain Edu?      Excl. in [place]?     Most recent vital signs: Vitals:   11/27/21 1007  BP: (!) 146/81  Pulse: 91  Resp: (!) 21  Temp: (!) 97.5 F (36.4 C)  SpO2: 94%    General: Awake, no distress.  Patient is somnolent but will awaken to voice and answer simple questions.  Denies any pain. CV:  Good peripheral perfusion.  Regular rate and rhythm  Resp:  Mild tachypnea with mild diffuse expiratory wheeze.  No obvious rales or rhonchi. Abd:  No distention.  Soft, nontender.  No rebound or guarding.  Obese. Other:  2-3+ lower extremity edema bilaterally.   ED Results /  Procedures / Treatments   EKG  EKG viewed and interpreted by myself shows atrial fibrillation at 80 bpm with a narrow QRS, normal axis, normal intervals, nonspecific ST changes.  RADIOLOGY  Chest x-ray reviewed and interpreted by myself appears to have some mild opacities bilaterally. Radiology is read the x-ray as cardiomegaly with vascular congestion.   MEDICATIONS ORDERED IN ED: Medications  ipratropium-albuterol (DUONEB) 0.5-2.5 (3) MG/3ML nebulizer solution 3 mL (has no administration in time range)  ipratropium-albuterol (DUONEB) 0.5-2.5 (3) MG/3ML nebulizer solution 3 mL (has no administration in time range)     IMPRESSION / MDM / ASSESSMENT AND PLAN / ED COURSE  I reviewed the triage vital signs and the nursing notes.  Patient's presentation is most consistent with acute presentation with potential threat to life or bodily function.  Patient presents emergency department for altered mental status and shortness of breath.  Patient does have expiratory wheeze on exam per EMS was satting in the 80s on room air placed on 2 L nasal cannula.  Currently satting in the low 90s.  However given the mild tachypnea and diffuse wheeze as well as increased  lower extremity edema we will place the patient on BiPAP we will dose DuoNebs.  Given the patient's somnolence we will obtain an ABG to evaluate for possible hypercarbia.  We will check labs including urinalysis to evaluate for ACS metabolic or electrolyte abnormality as well as infectious etiology.  We will continue to closely monitor while awaiting results.  Patient appears to be doing better.  Daughter is now here with the patient he states the patient is acting more confused than normal today but does state a history of worsening dementia.  Patient shortness of breath doing much better on BiPAP chest x-ray shows what looks like mild interstitial edema we will dose 60 mg of IV Lasix.  Satting 100% on BiPAP.  Will admit to the hospitalist  service for further work-up and treatment.  Daughter agreeable to plan of care.  CRITICAL CARE Performed by: Harvest Dark   Total critical care time: 30 minutes  Critical care time was exclusive of separately billable procedures and treating other patients.  Critical care was necessary to treat or prevent imminent or life-threatening deterioration.  Critical care was time spent personally by me on the following activities: development of treatment plan with patient and/or surrogate as well as nursing, discussions with consultants, evaluation of patient's response to treatment, examination of patient, obtaining history from patient or surrogate, ordering and performing treatments and interventions, ordering and review of laboratory studies, ordering and review of radiographic studies, pulse oximetry and re-evaluation of patient's condition.   FINAL CLINICAL IMPRESSION(S) / ED DIAGNOSES   Altered mental status Dementia Dyspnea CHF exacerbation   Note:  This document was prepared using Dragon voice recognition software and may include unintentional dictation errors.   Harvest Dark, MD 11/27/21 1154

## 2021-11-27 NOTE — Assessment & Plan Note (Addendum)
Patient has acute respiratory failure with hypoxia, requiring BiPAP.  This is likely due to combination of CHF and COPD exacerbation.  Patient has wheezing on auscultation, indicating COPD/asthma exacerbation.  Patient has a bilateral leg edema, elevated BNP 129, vascular congestion on chest x-ray, clinically consistent with CHF exacerbation.  -Admitted to PCU as inpatient -Bronchodilators -IV Lasix for CHF -Try to wean off BiPAP -Nasal cannula oxygen to maintain oxygen saturation above 93% when patient is off BiPAP

## 2021-11-27 NOTE — H&P (Addendum)
History and Physical    [name] BHA:[id number] DOB: March 22, 1930 DOA: 11/27/2021  Referring MD/NP/PA:   PCP: [facility]   Patient coming from:  The patient is coming from ALF   Chief Complaint: SOB and AMS  HPI: [name] is a 88 y.o. female with medical history significant of COPD not on oxygen, asthma, hypothyroidism, gout, depression, OSA on CPAP, PVD, MRSA, dCHF, atrial fibrillation on Eliquis, CKD-3A, UTI, hyperparathyroidism, hypercalcemia, lymphedema, early stage of dementia, leg cellulitis, who presents with AMS and SOB.  Patient was recently hospitalized from 9/8 - 9/10 due to altered mental status.  Patient had negative work-up including negative CT of head, normal vitamin B1 level and slightly elevated TSH 4.569.  Her altered mental status was thought possibly due to delirium in the setting of dementia. Per her daughter, patient has been confused again in the past several days, with agitation.  At her normal baseline before last admission, patient was oriented x3.  Today patient is confused, not oriented x3.  Patient moves all extremities.  No facial droop.  Per her daughter, patient has short of breath and dry cough, does not seem to have chest pain.  No active nausea ,vomiting, diarrhea or abdominal pain.  No symptoms of UTI.  Patient has bilateral leg edema and wheezing.  Patient is not using oxygen normally, was found to have oxygen desaturation to 80% on room air, with acute respiratory distress.  BiPAP was started in ED.  Data reviewed independently and ED Course: pt was found to have BNP 129.7, troponin level 13, negative COVID PCR, urinalysis negative except for rare bacteria, stable renal function, temperature normal, blood pressure 112/86, heart rate 99, RR 23.  ABG with pH 7.44, CO2 50, O2 104 on BiPAP.  Chest x-ray showed cardiomegaly and vascular congestion.  Patient is admitted to PCU as inpatient   EKG: I have personally reviewed.  Atrial  fibrillation, QTc 410, low voltage, nonspecific T wave change   Review of Systems: Could not reviewed due to altered mental status.  Allergy:  Allergies  Allergen Reactions   Prednisone Other (See Comments)    Irritability   Aspirin Nausea Only   Sulfa Antibiotics Rash   Sulfacetamide Sodium Rash    Past Medical History:  Diagnosis Date   Acute metabolic encephalopathy 11/01/3530   Arrhythmia    atrial fibrillation   Arthritis    Asthma    CHF (congestive heart failure) (HCC)    COPD (chronic obstructive pulmonary disease) (HCC)    Cough    Dementia (HCC)    Elevated CK 11/01/2021   GERD (gastroesophageal reflux disease)    Gout    Hiatal hernia    Hypertension    Hypothyroidism    MRSA (methicillin resistant staph aureus) culture positive    Neuropathic pain of right lower extremity    Peripheral vascular disease ([place])    "poor circulation"   Seasonal allergies    Shortness of breath dyspnea    Sleep apnea    uses CPAP (sometimes)    Past Surgical History:  Procedure Laterality Date   ANKLE ARTHROSCOPY     BACK SURGERY     L4-L5 Decompression   CARDIAC CATHETERIZATION  1995   CATARACT EXTRACTION W/PHACO Right 07/19/2014   Procedure: CATARACT EXTRACTION PHACO AND INTRAOCULAR LENS PLACEMENT ([place]);  Surgeon: Leandrew Koyanagi, MD;  Location: [facility];  Service: Ophthalmology;  Laterality: Right;   CATARACT EXTRACTION W/PHACO Left 08/23/2014   Procedure: CATARACT EXTRACTION  PHACO AND INTRAOCULAR LENS PLACEMENT ([facility]);  Surgeon: Leandrew Koyanagi, MD;  Location: [facility];  Service: Ophthalmology;  Laterality: Left;   CHOLECYSTECTOMY     COLONOSCOPY     PARATHYROIDECTOMY  2003   TONSILLECTOMY     UVULOPALATOPHARYNGOPLASTY      Social History:  reports that she has never smoked. She has been exposed to tobacco smoke. She has never used smokeless tobacco. She reports that she does not drink alcohol and does not use drugs.  Family History:   Family History  Problem Relation Age of Onset   Cancer Mother    Hyperlipidemia Son    Diabetes Son    Cancer Maternal Grandmother      Prior to Admission medications   Medication Sig Start Date End Date Taking? Authorizing Provider  acetaminophen (TYLENOL) 500 MG tablet Take 500-1,000 mg by mouth every 6 (six) hours as needed for mild pain or moderate pain.    [provider]  albuterol (PROVENTIL) (2.5 MG/3ML) 0.083% nebulizer solution Take 2.5 mg by nebulization in the morning and at bedtime.    [provider]  albuterol (VENTOLIN HFA) 108 (90 Base) MCG/ACT inhaler Inhale 2 puffs into the lungs every 6 (six) hours as needed for wheezing or shortness of breath.    [provider]  apixaban (ELIQUIS) 5 MG TABS tablet Take 5 mg by mouth 2 (two) times daily.    [provider]  benzonatate (TESSALON) 100 MG capsule Take 100 mg by mouth 3 (three) times daily as needed for cough.    [provider]  DULoxetine (CYMBALTA) 60 MG capsule Take 60 mg by mouth daily. 05/04/20   [provider]  famotidine (PEPCID) 20 MG tablet Take 20 mg by mouth at bedtime.    [provider]  furosemide (LASIX) 20 MG tablet Take 20 mg by mouth 2 (two) times daily. 04/04/21   [provider]  hydrOXYzine (ATARAX) 10 MG tablet Take 10 mg by mouth 2 (two) times daily. 04/01/21   [provider]  ipratropium (ATROVENT) 0.02 % nebulizer solution Take 0.5 mg by nebulization 4 (four) times daily.    [provider]  levothyroxine (SYNTHROID) 150 MCG tablet Take 150 mcg by mouth daily. 04/01/21   [provider]  lidocaine 4 % 1 patch daily. 10/17/21   [provider]  loratadine (CLARITIN) 10 MG tablet Take 10 mg by mouth daily.    [provider]  metoprolol succinate (TOPROL-XL) 25 MG 24 hr tablet Take 0.5 tablets (12.5 mg total) by mouth at bedtime. 07/10/20   Loletha Grayer, MD  montelukast (SINGULAIR) 10 MG  tablet Take 10 mg by mouth at bedtime.    [provider]  Multiple Vitamins-Minerals (PRESERVISION AREDS 2+MULTI VIT) CAPS Take 1 capsule by mouth daily.    [provider]  nystatin cream (MYCOSTATIN) Apply 1 application. topically 3 (three) times daily. (Apply under the breasts)    [provider]  traMADol (ULTRAM) 50 MG tablet Take 50 mg by mouth every 6 (six) hours as needed.    [provider]  triamcinolone cream (KENALOG) 0.1 % Apply 1 application. topically 2 (two) times daily.    [provider]  vitamin B-12 (CYANOCOBALAMIN) 500 MCG tablet Take 500 mcg by mouth daily.    [provider]    Physical Exam: Vitals:   11/27/21 1411 11/27/21 1415 11/27/21 1430 11/27/21 1549  BP:    (!) 161/65  Pulse:  88 81 84  Resp:  '13 15 14  '$ Temp: 97.9 F (36.6 C)     TempSrc:      SpO2:  95% 94% 99%   General: in acute respiratory distress HEENT:       Eyes: PERRL, EOMI, no scleral icterus.       ENT: No discharge from the ears and nose.       Neck: positive JVD, no bruit, no mass felt. Heme: No neck lymph node enlargement. Cardiac: S1/S2, RRR, No murmurs, No gallops or rubs. Respiratory: Has wheezing and crackles on auscultation GI: Soft, nondistended, nontender, no organomegaly, BS present. GU: No hematuria Ext: 2+ pitting leg edema bilaterally. 1+DP/PT pulse bilaterally. Musculoskeletal: No joint deformities, No joint redness or warmth, no limitation of ROM in spin. Skin: No rashes.  Neuro: confused, not oriented X3, cranial nerves II-XII grossly intact, moves all extremities Psych: Patient is not psychotic, no suicidal or hemocidal ideation.  Labs on Admission: I have personally reviewed following labs and imaging studies  CBC: Recent Labs  Lab 11/27/21 1017  WBC 11.9*  HGB 11.5*  HCT 37.2  MCV 86.7  PLT 536   Basic Metabolic Panel: Recent Labs  Lab 11/27/21 1017  NA 140  K 4.4  CL 105  CO2 32  GLUCOSE 126*   BUN 23  CREATININE 1.05*  CALCIUM 10.5*  MG 2.1   GFR: CrCl cannot be calculated (Unknown ideal weight.). Liver Function Tests: Recent Labs  Lab 11/27/21 1017  AST 17  ALT 10  ALKPHOS 81  BILITOT 0.6  PROT 7.1  ALBUMIN 3.5   No results for input(s): "LIPASE", "AMYLASE" in the last 168 hours. No results for input(s): "AMMONIA" in the last 168 hours. Coagulation Profile: No results for input(s): "INR", "PROTIME" in the last 168 hours. Cardiac Enzymes: No results for input(s): "CKTOTAL", "CKMB", "CKMBINDEX", "TROPONINI" in the last 168 hours. BNP (last 3 results) No results for input(s): "PROBNP" in the last 8760 hours. HbA1C: No results for input(s): "HGBA1C" in the last 72 hours. CBG: No results for input(s): "GLUCAP" in the last 168 hours. Lipid Profile: No results for input(s): "CHOL", "HDL", "LDLCALC", "TRIG", "CHOLHDL", "LDLDIRECT" in the last 72 hours. Thyroid Function Tests: No results for input(s): "TSH", "T4TOTAL", "FREET4", "T3FREE", "THYROIDAB" in the last 72 hours. Anemia Panel: No results for input(s): "VITAMINB12", "FOLATE", "FERRITIN", "TIBC", "IRON", "RETICCTPCT" in the last 72 hours. Urine analysis:    Component Value Date/Time   COLORURINE YELLOW (A) 11/27/2021 1025   APPEARANCEUR CLEAR (A) 11/27/2021 1025   APPEARANCEUR Clear 01/19/2013 1248   LABSPEC 1.018 11/27/2021 1025   LABSPEC 1.004 01/19/2013 1248   PHURINE 5.0 11/27/2021 1025   GLUCOSEU NEGATIVE 11/27/2021 1025   GLUCOSEU Negative 01/19/2013 1248   HGBUR NEGATIVE 11/27/2021 1025   BILIRUBINUR NEGATIVE 11/27/2021 1025   BILIRUBINUR Negative 01/19/2013 1248   KETONESUR NEGATIVE 11/27/2021 1025   PROTEINUR NEGATIVE 11/27/2021 1025   NITRITE NEGATIVE 11/27/2021 1025   LEUKOCYTESUR NEGATIVE 11/27/2021 1025   LEUKOCYTESUR Negative 01/19/2013 1248   Sepsis Labs: '@LABRCNTIP'$ (procalcitonin:4,lacticidven:4) ) Recent Results (from the past 240 hour(s))  Resp Panel by RT-PCR (Flu A&B, Covid)  Anterior Nasal Swab     Status: None   Collection Time: 11/27/21 10:19 AM   Specimen: Anterior Nasal Swab  Result Value Ref Range Status   SARS Coronavirus 2 by RT PCR NEGATIVE NEGATIVE Final    Comment: (NOTE) SARS-CoV-2 target nucleic acids are NOT DETECTED.  The SARS-CoV-2 RNA is generally detectable in upper respiratory specimens during the  acute phase of infection. The lowest concentration of SARS-CoV-2 viral copies this assay can detect is 138 copies/mL. A negative result does not preclude SARS-Cov-2 infection and should not be used as the sole basis for treatment or other patient management decisions. A negative result may occur with  improper specimen collection/handling, submission of specimen other than nasopharyngeal swab, presence of viral mutation(s) within the areas targeted by this assay, and inadequate number of viral copies(<138 copies/mL). A negative result must be combined with clinical observations, patient history, and epidemiological information. The expected result is Negative.  Fact Sheet for Patients:  EntrepreneurPulse.com.au  Fact Sheet for Healthcare Providers:  IncredibleEmployment.be  This test is no t yet approved or cleared by the [place] FDA and  has been authorized for detection and/or diagnosis of SARS-CoV-2 by FDA under an Emergency Use Authorization (EUA). This EUA will remain  in effect (meaning this test can be used) for the duration of the COVID-19 declaration under Section 564(b)(1) of the Act, 21 U.S.C.section 360bbb-3(b)(1), unless the authorization is terminated  or revoked sooner.       Influenza A by PCR NEGATIVE NEGATIVE Final   Influenza B by PCR NEGATIVE NEGATIVE Final    Comment: (NOTE) The Xpert Xpress SARS-CoV-2/FLU/RSV plus assay is intended as an aid in the diagnosis of influenza from Nasopharyngeal swab specimens and should not be used as a sole basis for treatment. Nasal washings  and aspirates are unacceptable for Xpert Xpress SARS-CoV-2/FLU/RSV testing.  Fact Sheet for Patients: EntrepreneurPulse.com.au  Fact Sheet for Healthcare Providers: IncredibleEmployment.be  This test is not yet approved or cleared by the [place] FDA and has been authorized for detection and/or diagnosis of SARS-CoV-2 by FDA under an Emergency Use Authorization (EUA). This EUA will remain in effect (meaning this test can be used) for the duration of the COVID-19 declaration under Section 564(b)(1) of the Act, 21 U.S.C. section 360bbb-3(b)(1), unless the authorization is terminated or revoked.  Performed at [facility], [address] [place], [place] [address]      Radiological Exams on Admission: CT HEAD WO CONTRAST (5MM)  Result Date: 11/27/2021 CLINICAL DATA:  Mental status change, unknown cause. EXAM: CT HEAD WITHOUT CONTRAST TECHNIQUE: Contiguous axial images were obtained from the base of the skull through the vertex without intravenous contrast. RADIATION DOSE REDUCTION: This exam was performed according to the departmental dose-optimization program which includes automated exposure control, adjustment of the mA and/or kV according to patient size and/or use of iterative reconstruction technique. COMPARISON:  Head CT 11/01/2021 FINDINGS: Brain: There is no evidence of an acute infarct, intracranial hemorrhage, mass, midline shift, or extra-axial fluid collection. Mild cerebral atrophy is within normal limits for age. No age advanced white matter disease is evident. Vascular: Calcified atherosclerosis at the skull base. No hyperdense vessel. Skull: No fracture or suspicious osseous lesion. Sinuses/Orbits: Chronic right sphenoid sinusitis. Clear mastoid air cells. Bilateral cataract extraction. Other: None. IMPRESSION: No evidence of acute intracranial abnormality. Electronically Signed   By: Logan Bores M.D.   On: 11/27/2021 14:17    DG Chest Portable 1 View  Result Date: 11/27/2021 CLINICAL DATA:  Shortness of breath EXAM: PORTABLE CHEST 1 VIEW COMPARISON:  11/01/2021 FINDINGS: Bilateral interstitial thickening. No focal consolidation. No pleural effusion or pneumothorax. Stable cardiomegaly. No acute osseous abnormality. IMPRESSION: Cardiomegaly with pulmonary vascular congestion. Electronically Signed   By: Kathreen Devoid M.D.   On: 11/27/2021 10:35      Assessment/Plan Principal Problem:   Acute respiratory failure  with hypoxia ([place]) Active Problems:   Acute on chronic diastolic CHF (congestive heart failure) ([place])   COPD with acute exacerbation (HCC)   Atrial fibrillation, chronic (HCC)   Chronic kidney disease, stage 3a (HCC)   Hypercalcemia   Hyperparathyroidism (HCC)   Hypothyroidism   Hypertension   Acute metabolic encephalopathy   OSA (obstructive sleep apnea)   Morbid obesity with BMI of 45.0-49.9, adult ([place])   Depression with anxiety   Assessment and Plan: * Acute respiratory failure with hypoxia ([place]) Patient has acute respiratory failure with hypoxia, requiring BiPAP.  This is likely due to combination of CHF and COPD exacerbation.  Patient has wheezing on auscultation, indicating COPD/asthma exacerbation.  Patient has a bilateral leg edema, elevated BNP 129, vascular congestion on chest x-ray, clinically consistent with CHF exacerbation.  -Admitted to PCU as inpatient -Bronchodilators -IV Lasix for CHF -Try to wean off BiPAP -Nasal cannula oxygen to maintain oxygen saturation above 93% when patient is off BiPAP  Acute on chronic diastolic CHF (congestive heart failure) ([place]) 2D echo on 07/07/2021 showed EF of 55 to 60%. -Lasix 60 mg bid by IV -Daily weights -strict I/O's -Low salt diet -Fluid restriction    COPD with acute exacerbation (HCC) -Bronchodilators -Will not give steroids since patient is allergic to prednisone -As needed Mucinex -Incentive spirometry   Atrial  fibrillation, chronic (HCC) Heart rate 99 -Continue Eliquis -Continue metoprolol  Chronic kidney disease, stage 3a (HCC) Renal function stable -Follow-up by BMP  Hyperparathyroidism ([place]) - See above  Hypercalcemia Hypercalcemia due to hyperparathyroidism: Calcium slightly elevated 10.5. -Follow up with BMP  Hypothyroidism - Synthroid  Hypertension - IV hydralazine as needed -Patient is on IV Lasix -Metoprolol  Acute metabolic encephalopathy Etiology is not clear.  Urinalysis negative.  Potential differential diagnosis include delirium, hypoxia, progression of dementia.  Since patient is on Eliquis, will get CT of head to rule out any acute injury. -Recurrent neurochecks -Follow-up CT of head --> negative  OSA (obstructive sleep apnea) - Patient is on BiPAP currently -Restart CPAP when patient is off BiPAP  Morbid obesity with BMI of 45.0-49.9, adult (HCC)  BMI= 48.92  and BW= 131.7kg -Diet and exercise.   -Encourage to lose weight.   Depression with anxiety - Continue home medications          DVT ppx: on Eliquis  Code Status: Partial code per her daughter (okay for CPR, no intubation)  Family Communication:   Yes, patient's daughter   at bed side.    Disposition Plan:  Anticipate discharge back to previous environment, ALF  Consults called:  none  Admission status and Level of care: Progressive:    as inpt     Dispo: The patient is from: ALF              Anticipated d/c is to: ALF              Anticipated d/c date is: 2 days              Patient currently is not medically stable to d/c.    Severity of Illness:  The appropriate patient status for this patient is INPATIENT. Inpatient status is judged to be reasonable and necessary in order to provide the required intensity of service to ensure the patient's safety. The patient's presenting symptoms, physical exam findings, and initial radiographic and laboratory data in the context of their  chronic comorbidities is felt to place them at high risk for further clinical deterioration. Furthermore,  it is not anticipated that the patient will be medically stable for discharge from the hospital within 2 midnights of admission.   * I certify that at the point of admission it is my clinical judgment that the patient will require inpatient hospital care spanning beyond 2 midnights from the point of admission due to high intensity of service, high risk for further deterioration and high frequency of surveillance required.*       Date of Service 11/27/2021    Ivor Costa [facility]   If 7PM-7AM, please contact night-coverage [email or web address] 11/27/2021, 6:22 PM

## 2021-11-27 NOTE — Assessment & Plan Note (Signed)
2D echo on 07/07/2021 showed EF of 55 to 60%. -Lasix 60 mg bid by IV -Daily weights -strict I/O's -Low salt diet -Fluid restriction

## 2021-11-27 NOTE — ED Notes (Signed)
Pericare provided, pt straight cath'd, purwic placed on pt.

## 2021-11-27 NOTE — Assessment & Plan Note (Signed)
Renal function stable -Follow-up by BMP

## 2021-11-27 NOTE — Assessment & Plan Note (Signed)
-   Continue home medications 

## 2021-11-27 NOTE — Assessment & Plan Note (Signed)
  BMI= 48.92  and BW= 131.7kg -Diet and exercise.   -Encourage to lose weight.

## 2021-11-28 ENCOUNTER — Other Ambulatory Visit: Payer: Self-pay

## 2021-11-28 ENCOUNTER — Encounter: Payer: Self-pay | Admitting: Internal Medicine

## 2021-11-28 DIAGNOSIS — J441 Chronic obstructive pulmonary disease with (acute) exacerbation: Secondary | ICD-10-CM

## 2021-11-28 DIAGNOSIS — N1831 Chronic kidney disease, stage 3a: Secondary | ICD-10-CM

## 2021-11-28 DIAGNOSIS — I482 Chronic atrial fibrillation, unspecified: Secondary | ICD-10-CM

## 2021-11-28 DIAGNOSIS — I5033 Acute on chronic diastolic (congestive) heart failure: Secondary | ICD-10-CM | POA: Diagnosis not present

## 2021-11-28 DIAGNOSIS — Z789 Other specified health status: Secondary | ICD-10-CM

## 2021-11-28 DIAGNOSIS — E213 Hyperparathyroidism, unspecified: Secondary | ICD-10-CM

## 2021-11-28 DIAGNOSIS — G9341 Metabolic encephalopathy: Secondary | ICD-10-CM

## 2021-11-28 DIAGNOSIS — F418 Other specified anxiety disorders: Secondary | ICD-10-CM

## 2021-11-28 DIAGNOSIS — G4733 Obstructive sleep apnea (adult) (pediatric): Secondary | ICD-10-CM

## 2021-11-28 DIAGNOSIS — J9601 Acute respiratory failure with hypoxia: Secondary | ICD-10-CM

## 2021-11-28 DIAGNOSIS — E039 Hypothyroidism, unspecified: Secondary | ICD-10-CM

## 2021-11-28 DIAGNOSIS — Z6841 Body Mass Index (BMI) 40.0 and over, adult: Secondary | ICD-10-CM

## 2021-11-28 LAB — BASIC METABOLIC PANEL
Anion gap: 10 (ref 5–15)
BUN: 26 mg/dL — ABNORMAL HIGH (ref 8–23)
CO2: 31 mmol/L (ref 22–32)
Calcium: 10.5 mg/dL — ABNORMAL HIGH (ref 8.9–10.3)
Chloride: 100 mmol/L (ref 98–111)
Creatinine, Ser: 0.98 mg/dL (ref 0.44–1.00)
GFR, Estimated: 55 mL/min — ABNORMAL LOW (ref >60)
Glucose, Bld: 134 mg/dL — ABNORMAL HIGH (ref 70–99)
Potassium: 4.1 mmol/L (ref 3.5–5.1)
Sodium: 141 mmol/L (ref 135–145)

## 2021-11-28 LAB — CBC
HCT: 39.9 % (ref 36.0–46.0)
Hemoglobin: 12.2 g/dL (ref 12.0–15.0)
MCH: 25.8 pg — ABNORMAL LOW (ref 26.0–34.0)
MCHC: 30.6 g/dL (ref 30.0–36.0)
MCV: 84.5 fL (ref 80.0–100.0)
Platelets: 240 10*3/uL (ref 150–400)
RBC: 4.72 MIL/uL (ref 3.87–5.11)
RDW: 15.1 % (ref 11.5–15.5)
WBC: 10.2 10*3/uL (ref 4.0–10.5)
nRBC: 0 % (ref 0.0–0.2)

## 2021-11-28 LAB — URINE CULTURE: Culture: NO GROWTH

## 2021-11-28 LAB — GLUCOSE, CAPILLARY: Glucose-Capillary: 113 mg/dL — ABNORMAL HIGH (ref 70–99)

## 2021-11-28 MED ORDER — MENTHOL 3 MG MT LOZG
1.0000 | LOZENGE | OROMUCOSAL | Status: DC | PRN
  Administered 2021-11-29 – 2021-12-02 (×5): 3 mg via ORAL
  Filled 2021-11-28 (×2): qty 9

## 2021-11-28 MED ORDER — ENOXAPARIN SODIUM 150 MG/ML IJ SOSY
1.0000 mg/kg | PREFILLED_SYRINGE | Freq: Once | INTRAMUSCULAR | Status: AC
  Administered 2021-11-28: 132 mg via SUBCUTANEOUS
  Filled 2021-11-28: qty 0.88

## 2021-11-28 MED ORDER — ORAL CARE MOUTH RINSE
15.0000 mL | OROMUCOSAL | Status: DC
  Administered 2021-11-28 – 2021-12-02 (×13): 15 mL via OROMUCOSAL

## 2021-11-28 MED ORDER — MIDODRINE HCL 5 MG PO TABS
10.0000 mg | ORAL_TABLET | Freq: Three times a day (TID) | ORAL | Status: DC
  Administered 2021-11-28 – 2021-12-02 (×9): 10 mg via ORAL
  Filled 2021-11-28 (×11): qty 2

## 2021-11-28 MED ORDER — ORAL CARE MOUTH RINSE: 15.0000 mL | OROMUCOSAL | Status: DC | PRN

## 2021-11-28 MED ORDER — GUAIFENESIN-CODEINE 100-10 MG/5ML PO SOLN: 10.0000 mL | Freq: Every evening | ORAL | Status: DC | PRN

## 2021-11-28 MED ORDER — ENOXAPARIN SODIUM 150 MG/ML IJ SOSY
1.0000 mg/kg | PREFILLED_SYRINGE | Freq: Two times a day (BID) | INTRAMUSCULAR | Status: DC
  Administered 2021-11-28 – 2021-11-29 (×2): 132 mg via SUBCUTANEOUS
  Filled 2021-11-28 (×2): qty 0.88

## 2021-11-28 MED ORDER — HYDROCOD POLI-CHLORPHE POLI ER 10-8 MG/5ML PO SUER
5.0000 mL | Freq: Every evening | ORAL | Status: DC | PRN
  Administered 2021-11-28: 5 mL via ORAL
  Filled 2021-11-28: qty 5

## 2021-11-28 NOTE — Progress Notes (Addendum)
       CROSS COVER NOTE  NAME: [name] MRN: [id number] DOB : 1930/04/08    Date of Service   11/28/2021   HPI/Events of Note   Message received from nursing --> "Good evening. Patient is yellow mews, she was in the ED. I will be following protocol and round frequently. Patient is alert to self and responds to voice. She is on continuous Bipap and is not alert and oriented enough to safely ingest PO meds or fluids. Can you changed the route of her scheduled medications if appropraite"  On review of chart Temp 36.6 BP 122/46 HR 83 RR 12 SPO2 100% on Bipap 35% FiO2. Above vitals and mental status= MEWS of 1. No increased monitoring or intervention required.  Interventions   Assessment/Plan:  Atrial Fibrillation Lovenox per pharmacy, transition back to Eliquis when able      This document was prepared using Dragon voice recognition software and may include unintentional dictation errors.  Neomia Glass DNP, MHA, FNP-BC Nurse Practitioner [facility] Pager [phone]

## 2021-11-28 NOTE — Progress Notes (Addendum)
[place] for Lovenox Tx Indication: atrial fibrillation  Allergies  Allergen Reactions   Prednisone Other (See Comments)    Irritability   Aspirin Nausea Only   Sulfa Antibiotics Rash   Sulfacetamide Sodium Rash    Vital Signs: Temp: 98.3 F (36.8 C) (10/04 2346) Temp Source: Axillary (10/04 2346) BP: 151/71 (10/04 2346) Pulse Rate: 97 (10/04 2346)  Labs: Recent Labs    11/27/21 1017  HGB 11.5*  HCT 37.2  PLT 234  CREATININE 1.05*  TROPONINIHS 13    CrCl cannot be calculated (Unknown ideal weight.).   Medical History: Past Medical History:  Diagnosis Date   Acute metabolic encephalopathy 08/01/5447   Arrhythmia    atrial fibrillation   Arthritis    Asthma    CHF (congestive heart failure) (HCC)    COPD (chronic obstructive pulmonary disease) (HCC)    Cough    Dementia (HCC)    Elevated CK 11/01/2021   GERD (gastroesophageal reflux disease)    Gout    Hiatal hernia    Hypertension    Hypothyroidism    MRSA (methicillin resistant staph aureus) culture positive    Neuropathic pain of right lower extremity    Peripheral vascular disease (HCC)    "poor circulation"   Seasonal allergies    Shortness of breath dyspnea    Sleep apnea    uses CPAP (sometimes)    Medications:  PTA meds: Eliquis 5 mg BID  Assessment: Pt is 88 yo female with hx of A fib on Eliquis, being transitioned to Lovenox Tx.  Goal of Therapy:  Anti-Xa level 0.6-1 units/ml 4hrs after LMWH dose given Monitor platelets by anticoagulation protocol: Yes   Plan:  D/C Eliquis Ordered Lovenox 1 mg/kg q12h (BMI > 30) Will order LMWH as appropriate CBC q72h while on Lovenox Tx  Renda Rolls, PharmD, [facility] 11/28/2021 1:01 AM

## 2021-11-28 NOTE — Progress Notes (Signed)
Unna boots changed on BLE to assess skin. No wounds, breakdown, erythema, and blisters noted. Patient tolerated dressing change well.

## 2021-11-28 NOTE — Progress Notes (Signed)
[place] for Lovenox Tx Indication: atrial fibrillation  Allergies  Allergen Reactions   Prednisone Other (See Comments)    Irritability   Aspirin Nausea Only   Sulfa Antibiotics Rash   Sulfacetamide Sodium Rash    Vital Signs: Temp: 97.7 F (36.5 C) (10/05 0813) Temp Source: Axillary (10/05 0322) BP: 120/82 (10/05 0817) Pulse Rate: 119 (10/05 0817)  Labs: Recent Labs    11/27/21 1017 11/28/21 0557  HGB 11.5* 12.2  HCT 37.2 39.9  PLT 234 240  CREATININE 1.05* 0.98  TROPONINIHS 13  --      Estimated Creatinine Clearance: 52.4 mL/min (by C-G formula based on SCr of 0.98 mg/dL).   Medical History: Past Medical History:  Diagnosis Date   Acute metabolic encephalopathy 03/30/5571   Arrhythmia    atrial fibrillation   Arthritis    Asthma    CHF (congestive heart failure) (HCC)    COPD (chronic obstructive pulmonary disease) (HCC)    Cough    Dementia (HCC)    Elevated CK 11/01/2021   GERD (gastroesophageal reflux disease)    Gout    Hiatal hernia    Hypertension    Hypothyroidism    MRSA (methicillin resistant staph aureus) culture positive    Neuropathic pain of right lower extremity    Peripheral vascular disease (HCC)    "poor circulation"   Seasonal allergies    Shortness of breath dyspnea    Sleep apnea    uses CPAP (sometimes)    Medications:  PTA meds: Eliquis 5 mg BID  Assessment: Pt is 88 yo female with hx of Afib on Eliquis (CHADSVASc 7), presenting with AMS and SOB. Negative for PE or stroke. For stroke ppx ISO Afib, pt being transitioned to Lovenox Tx due to high risk of aspiration with PO meds currently held. Hgb, Hct, PLT stable. Currently meets no high-risk criteria for anti-Xa montioring for Lovenox. [place] protocol typically calls for CBC monitoring q72H while on enoxaparin but due to high TDD given treatment dosing, opting to monitor daily to assess for signs & symptoms of bleeding ISO acute  illness.  Baseline Labs: Hgb - 11.5; Plts - 234  Goal of Therapy:  Monitor platelets by anticoagulation protocol: Yes   Plan:  Continue Lovenox 1 mg/kg q12H (BMI >30) Monitor CBC daily due to high dose of enoxaparin  Dara Hoyer, PharmD PGY-1 Pharmacy Resident 11/28/2021 8:33 AM

## 2021-11-28 NOTE — Care Management Important Message (Signed)
Important Message  Patient Details  Name: [name] MRN: [id number] Date of Birth: [birth year]   Medicare Important Message Given:  N/A - LOS <3 / Initial given by admissions     Dannette Barbara 11/28/2021, 12:53 PM

## 2021-11-28 NOTE — Hospital Course (Addendum)
[name] is a 88 y.o. female with medical history significant of COPD not on oxygen, asthma, hypothyroidism, gout, depression, OSA on CPAP, PVD, MRSA, dCHF, atrial fibrillation on Eliquis, CKD-3A, UTI, hyperparathyroidism, hypercalcemia, lymphedema, early stage of dementia, leg cellulitis, who presents via EMS from [place] 11/27/2021 with AMS and SOB. Patient was recently hospitalized from 9/8 - 9/10 due to altered mental status.  Patient had negative work-up including negative CT of head, normal vitamin B1 level and slightly elevated TSH 4.569.  Her altered mental status was thought possibly due to delirium in the setting of dementia. Per her daughter, patient has been confused again in the past several days, with agitation.  At her normal baseline before last admission, patient was oriented x3.  Today patient is confused. Per her daughter, patient has short of breath and dry cough, does not seem to have chest pain.  No active nausea ,vomiting, diarrhea or abdominal pain.  No symptoms of UTI.  Patient has bilateral leg edema and wheezing. 10/04: oxygen desaturation to 80% on room air, with acute respiratory distress.  BiPAP was started in ED. BNP 129.7, troponin level 13, negative COVID PCR, urinalysis negative except for rare bacteria, stable renal function, temperature normal, blood pressure 112/86, heart rate 99, RR 23.  ABG with pH 7.44, CO2 50, O2 104 on BiPAP.  Chest x-ray showed cardiomegaly and vascular congestion.  Patient was admitted to PCU as inpatient for respiratory failure with hypoxia likely due to HFpEF exacerbation + COPD exacerbation. 10/05: Net IO documented Since Admission: -1,875 mL [11/28/21 0801]. Remains on BiPap in the morning but off in the afternoon and maintaining on 4L [place].  Hypotensive in afternoon/evening.  Held antihypertensives, started on midodrine.  ICU team aware in case worse. 10/06: Bump in creatinine to 1.4, reduced Lasix down to p.o. Net IO Since Admission: -1,960 mL  [11/29/21 0851] but suspect possible error, minimal UOP documented yesterday. Pt reports significant anxiety related to respiratory status. Echo done and pending read  10/07: pt taking BiPap off overnight, saturating okay this mornign on 2L [place]. Cr improved to 1.04. Net IO Since Admission: -4,770 mL [11/30/21 0934]. Echo LVEF 50-55%, global hypokinesis, LVH, indeterminate diastolic fxn,  78/29: not keeping O2 on, confused in AM. Spoke to daughter - I Lynnae Prude' think pt will be safe to go home, will get PT/OT. Pt is on hospice at home. I'm not convinced she's imminently end-of-life but may consider comfort measures depending on how she does over the next few days. Per PT "Family not interested in PT/SNF rehab, prefer to maintain hospice benefit and pathway at discharge. They feel that patient is unable to participate/benefit from therapy and prefer to 'keep her more comfortable'." 10/09: pt doing well this morning off O2 and stable for discharge. Called daughter, all questions answered.     Consultants:  none  Procedures: Echocardiogram 11/29/2021:  LVEF 50-55%, global hypokinesis, LVH, indeterminate diastolic fxn      ASSESSMENT & PLAN:   Principal Problem:   Acute respiratory failure with hypoxia (HCC) Active Problems:   Acute on chronic diastolic CHF (congestive heart failure) (HCC)   COPD with acute exacerbation (HCC)   Atrial fibrillation, chronic (HCC)   Chronic kidney disease, stage 3a (HCC)   Hypercalcemia   Hyperparathyroidism ([place])   Hypothyroidism   Hypertension   Acute metabolic encephalopathy   OSA (obstructive sleep apnea)   Morbid obesity with BMI of 45.0-49.9, adult ([place])   Depression with anxiety   Acute respiratory failure with  hypoxia (HCC) requiring BiPap Due to CHF/COPD see individual A/P below  wean off BiPAP as able, ok to use prn/qhs  Nasal cannula oxygen to maintain oxygen saturation above 93% when patient is off BiPAP  Acute on chronic diastolic CHF  (congestive heart failure) ([place]) 2D echo on 07/07/2021 showed EF of 55 to 60%. Repeat this admission shows  LVEF 50-55%, global hypokinesis, LVH, indeterminate diastolic fxn,  Lasix 60 mg bid by IV --> reduced given elevation creatinine --> Cr better today --> po lasix on discharge w/ parameters to increased prn symptoms  Daily weight Low salt diet Fluid restriction  COPD with acute exacerbation (HCC) Bronchodilators As needed Mucinex Incentive spirometry [place] on discharge   Acute metabolic encephalopathy - resolved Etiology is not clear.  Urinalysis negative.  Potential differential diagnosis include delirium, hypoxia, progression of dementia.   Atrial fibrillation, chronic (HCC) Continue Eliquis Continue metoprolol  AKI on Chronic kidney disease, stage 3a (HCC) - AKI resolved Follow-up BMP  Hypercalcemia Hyperparathyroidism ([place]) Hypercalcemia due to hyperparathyroidism: Calcium slightly elevated 10.5. Follow up with BMP  Hypothyroidism Synthroid  Hypertension - has been hypotensive here  Lasix for CHF Metoprolol for AFib rate control  Midodrine - may need to adjust outpatient  OSA (obstructive sleep apnea) Patient is on BiPAP now on RA Restart CPAP qhs   Morbid obesity with BMI of 45.0-49.9, adult (HCC) BMI= 48.92  and BW= 131.7kg Diet and exercise.   Encourage to lose weight.  Depression with anxiety Continue home medications  Advance care planning: Discussion with patient/daughter early admission - advised it is not beneficial to decline intubation but request CPR if cardiac arrest. Educated that CPR (even if successful ROSC) without intubation to maintain hemodynamic and respiratory stability or correct underlying respiratory abnormality, is not typically helpful.  Patient maintains that she would want CPR attempted, but would not want to be intubated under any circumstances.  Advise outpatient doctor/APP and hospice revisit this conversation again.

## 2021-11-28 NOTE — Progress Notes (Signed)
PROGRESS NOTE    [name]   CZY:[id number] DOB: 1930-05-30  DOA: 11/27/2021 Date of Service: 11/28/21 PCP: Orvis Brill, [facility]     Brief Narrative / Hospital Course:  [name] is a 88 y.o. female with medical history significant of COPD not on oxygen, asthma, hypothyroidism, gout, depression, OSA on CPAP, PVD, MRSA, dCHF, atrial fibrillation on Eliquis, CKD-3A, UTI, hyperparathyroidism, hypercalcemia, lymphedema, early stage of dementia, leg cellulitis, who presents via EMS from [place] 11/27/2021 with AMS and SOB. Patient was recently hospitalized from 9/8 - 9/10 due to altered mental status.  Patient had negative work-up including negative CT of head, normal vitamin B1 level and slightly elevated TSH 4.569.  Her altered mental status was thought possibly due to delirium in the setting of dementia. Per her daughter, patient has been confused again in the past several days, with agitation.  At her normal baseline before last admission, patient was oriented x3.  Today patient is confused. Per her daughter, patient has short of breath and dry cough, does not seem to have chest pain.  No active nausea ,vomiting, diarrhea or abdominal pain.  No symptoms of UTI.  Patient has bilateral leg edema and wheezing. 10/04: oxygen desaturation to 80% on room air, with acute respiratory distress.  BiPAP was started in ED. BNP 129.7, troponin level 13, negative COVID PCR, urinalysis negative except for rare bacteria, stable renal function, temperature normal, blood pressure 112/86, heart rate 99, RR 23.  ABG with pH 7.44, CO2 50, O2 104 on BiPAP.  Chest x-ray showed cardiomegaly and vascular congestion.  Patient was admitted to PCU as inpatient for respiratory failure with hypoxia likely due to HFpEF exacerbation + COPD exacerbation. 10/05: Net IO documented Since Admission: -1,875 mL [11/28/21 0801]. Remains on BiPap in the morning but off in the afternoon and maintaining on 4L [place].     Consultants:   none  Procedures: none      ASSESSMENT & PLAN:   Principal Problem:   Acute respiratory failure with hypoxia (HCC) Active Problems:   Acute on chronic diastolic CHF (congestive heart failure) (HCC)   COPD with acute exacerbation (HCC)   Atrial fibrillation, chronic (HCC)   Chronic kidney disease, stage 3a (HCC)   Hypercalcemia   Hyperparathyroidism (HCC)   Hypothyroidism   Hypertension   Acute metabolic encephalopathy   OSA (obstructive sleep apnea)   Morbid obesity with BMI of 45.0-49.9, adult ([place])   Depression with anxiety   Acute respiratory failure with hypoxia (HCC) requiring BiPap Due to CHF/COPD see below  -Admitted to PCU as inpatient -Bronchodilators -IV Lasix for CHF -wean off BiPAP as able, ok to use prn/qhs  -Nasal cannula oxygen to maintain oxygen saturation above 93% when patient is off BiPAP  Acute on chronic diastolic CHF (congestive heart failure) ([place]) 2D echo on 07/07/2021 showed EF of 55 to 60%. -Lasix 60 mg bid by IV -Daily weights -strict I/O's -Low salt diet -Fluid restriction  COPD with acute exacerbation (HCC) -Bronchodilators -Will not give steroids since patient is intolerant to prednisone -As needed Mucinex -Incentive spirometry  Acute metabolic encephalopathy Etiology is not clear.  Urinalysis negative.  Potential differential diagnosis include delirium, hypoxia, progression of dementia.  -neurochecks -alert off BiPap  -Follow-up CT of head --> negative  Atrial fibrillation, chronic (HCC) Heart rate 99 -Continue Eliquis -Continue metoprolol  Chronic kidney disease, stage 3a (HCC) Renal function stable -Follow-up BMP  Hypercalcemia Hyperparathyroidism ([place]) Hypercalcemia due to hyperparathyroidism: Calcium slightly elevated 10.5. -Follow up  with BMP  Hypothyroidism - Synthroid  Hypertension - IV hydralazine as needed -Patient is on IV Lasix -Metoprolol for AFib rate control   OSA (obstructive sleep  apnea) -Patient is on BiPAP currently -Restart CPAP qhs when patient is off BiPAP  Morbid obesity with BMI of 45.0-49.9, adult (HCC) BMI= 48.92  and BW= 131.7kg -Diet and exercise.   -Encourage to lose weight.  Depression with anxiety - Continue home medications      DVT prophylaxis: Continue home Eliquis Pertinent IV fluids/nutrition: no IV fluids in setting of CHF. Heart healthy diet.  Central lines / invasive devices: none  Code Status: CPR but no intubation - I advised against this, either full code of full DNR, stating that CPR/ROSC w/o intubation would not be helpful. Pt maintains she would not want intubation under any circumstances.  Family Communication: daughter at bedside this afternoon   Disposition: Inpatient TOC needs: Pending clinical improvement, may benefit from HH/SNF Barriers to discharge / significant pending items: Oxygen requirement, treatment acute conditions as above             Subjective:  Patient reports feeling better, coughing a lot though and pain in her side w/ cough.        Objective:  Vitals:   11/28/21 0813 11/28/21 0817 11/28/21 1108 11/28/21 1149  BP: (!) 100/49 120/82 (!) 116/59   Pulse: 97 99 100   Resp: '19 20 19   '$ Temp: 97.7 F (36.5 C)  (!) 97.5 F (36.4 C)   TempSrc:      SpO2: 99%  100% 100%  Weight:      Height:        Intake/Output Summary (Last 24 hours) at 11/28/2021 1628 Last data filed at 11/28/2021 0540 Gross per 24 hour  Intake --  Output 1875 ml  Net -1875 ml   Filed Weights   11/27/21 1945 11/28/21 0235  Weight: 131.3 kg 131.3 kg    Examination:  Constitutional:  VS as above General Appearance: alert, well-developed, well-nourished, NAD Eyes: Normal lids and conjunctive, non-icteric sclera Ears, Nose, Mouth, Throat: Normal external appearance MMM Neck: No masses, trachea midline Respiratory: Normal respiratory effort (+)diffuse  wheeze + rhonchi (+)basilar  rales Cardiovascular: S1/S2 normal, RRR No rub/gallop auscultated No lower extremity edema Gastrointestinal: No tenderness Musculoskeletal:  No clubbing/cyanosis of digits Symmetrical movement in all extremities Neurological: No cranial nerve deficit on limited exam Alert Psychiatric: Normal judgment/insight Normal mood and affect       Scheduled Medications:   cyanocobalamin  500 mcg Oral Daily   DULoxetine  60 mg Oral Daily   enoxaparin (LOVENOX) injection  1 mg/kg Subcutaneous Q12H   famotidine  20 mg Oral QHS   furosemide  60 mg Intravenous Q12H   ipratropium-albuterol  3 mL Nebulization QID   levothyroxine  150 mcg Oral Daily   lidocaine  1 patch Transdermal QHS   loratadine  10 mg Oral Daily   metoprolol succinate  12.5 mg Oral QHS   montelukast  10 mg Oral QHS   multivitamin-lutein  1 capsule Oral Daily   nystatin cream  1 Application Topical TID   mouth rinse  15 mL Mouth Rinse 4 times per day   pantoprazole  40 mg Oral Daily   risperiDONE  0.5 mg Oral BID   triamcinolone cream  1 Application Topical BID    Continuous Infusions:   PRN Medications:  acetaminophen, acetaminophen, albuterol, chlorpheniramine-HYDROcodone, dextromethorphan-guaiFENesin, hydrALAZINE, menthol-cetylpyridinium, ondansetron (ZOFRAN) IV, mouth rinse, traMADol  Antimicrobials:  Anti-infectives (From admission, onward)    None       Data Reviewed: I have personally reviewed following labs and imaging studies  CBC: Recent Labs  Lab 11/27/21 1017 11/28/21 0557  WBC 11.9* 10.2  HGB 11.5* 12.2  HCT 37.2 39.9  MCV 86.7 84.5  PLT 234 629   Basic Metabolic Panel: Recent Labs  Lab 11/27/21 1017 11/28/21 0557  NA 140 141  K 4.4 4.1  CL 105 100  CO2 32 31  GLUCOSE 126* 134*  BUN 23 26*  CREATININE 1.05* 0.98  CALCIUM 10.5* 10.5*  MG 2.1  --    GFR: Estimated Creatinine Clearance: 52.4 mL/min (by C-G formula based on SCr of 0.98 mg/dL). Liver Function  Tests: Recent Labs  Lab 11/27/21 1017  AST 17  ALT 10  ALKPHOS 81  BILITOT 0.6  PROT 7.1  ALBUMIN 3.5   No results for input(s): "LIPASE", "AMYLASE" in the last 168 hours. Recent Labs  Lab 11/27/21 1917  AMMONIA 10   Coagulation Profile: No results for input(s): "INR", "PROTIME" in the last 168 hours. Cardiac Enzymes: No results for input(s): "CKTOTAL", "CKMB", "CKMBINDEX", "TROPONINI" in the last 168 hours. BNP (last 3 results) No results for input(s): "PROBNP" in the last 8760 hours. HbA1C: No results for input(s): "HGBA1C" in the last 72 hours. CBG: Recent Labs  Lab 11/28/21 0822  GLUCAP 113*   Lipid Profile: No results for input(s): "CHOL", "HDL", "LDLCALC", "TRIG", "CHOLHDL", "LDLDIRECT" in the last 72 hours. Thyroid Function Tests: No results for input(s): "TSH", "T4TOTAL", "FREET4", "T3FREE", "THYROIDAB" in the last 72 hours. Anemia Panel: No results for input(s): "VITAMINB12", "FOLATE", "FERRITIN", "TIBC", "IRON", "RETICCTPCT" in the last 72 hours. Urine analysis:    Component Value Date/Time   COLORURINE YELLOW (A) 11/27/2021 1025   APPEARANCEUR CLEAR (A) 11/27/2021 1025   APPEARANCEUR Clear 01/19/2013 1248   LABSPEC 1.018 11/27/2021 1025   LABSPEC 1.004 01/19/2013 1248   PHURINE 5.0 11/27/2021 1025   GLUCOSEU NEGATIVE 11/27/2021 1025   GLUCOSEU Negative 01/19/2013 1248   HGBUR NEGATIVE 11/27/2021 1025   BILIRUBINUR NEGATIVE 11/27/2021 1025   BILIRUBINUR Negative 01/19/2013 1248   KETONESUR NEGATIVE 11/27/2021 1025   PROTEINUR NEGATIVE 11/27/2021 1025   NITRITE NEGATIVE 11/27/2021 1025   LEUKOCYTESUR NEGATIVE 11/27/2021 1025   LEUKOCYTESUR Negative 01/19/2013 1248   Sepsis Labs: '@LABRCNTIP'$ (procalcitonin:4,lacticidven:4)  Recent Results (from the past 240 hour(s))  Resp Panel by RT-PCR (Flu A&B, Covid) Anterior Nasal Swab     Status: None   Collection Time: 11/27/21 10:19 AM   Specimen: Anterior Nasal Swab  Result Value Ref Range Status   SARS  Coronavirus 2 by RT PCR NEGATIVE NEGATIVE Final    Comment: (NOTE) SARS-CoV-2 target nucleic acids are NOT DETECTED.  The SARS-CoV-2 RNA is generally detectable in upper respiratory specimens during the acute phase of infection. The lowest concentration of SARS-CoV-2 viral copies this assay can detect is 138 copies/mL. A negative result does not preclude SARS-Cov-2 infection and should not be used as the sole basis for treatment or other patient management decisions. A negative result may occur with  improper specimen collection/handling, submission of specimen other than nasopharyngeal swab, presence of viral mutation(s) within the areas targeted by this assay, and inadequate number of viral copies(<138 copies/mL). A negative result must be combined with clinical observations, patient history, and epidemiological information. The expected result is Negative.  Fact Sheet for Patients:  EntrepreneurPulse.com.au  Fact Sheet for Healthcare Providers:  IncredibleEmployment.be  This test is no t  yet approved or cleared by the [place] and  has been authorized for detection and/or diagnosis of SARS-CoV-2 by FDA under an Emergency Use Authorization (EUA). This EUA will remain  in effect (meaning this test can be used) for the duration of the COVID-19 declaration under Section 564(b)(1) of the Act, 21 U.S.C.section 360bbb-3(b)(1), unless the authorization is terminated  or revoked sooner.       Influenza A by PCR NEGATIVE NEGATIVE Final   Influenza B by PCR NEGATIVE NEGATIVE Final    Comment: (NOTE) The Xpert Xpress SARS-CoV-2/FLU/RSV plus assay is intended as an aid in the diagnosis of influenza from Nasopharyngeal swab specimens and should not be used as a sole basis for treatment. Nasal washings and aspirates are unacceptable for Xpert Xpress SARS-CoV-2/FLU/RSV testing.  Fact Sheet for  Patients: EntrepreneurPulse.com.au  Fact Sheet for Healthcare Providers: IncredibleEmployment.be  This test is not yet approved or cleared by the [place] FDA and has been authorized for detection and/or diagnosis of SARS-CoV-2 by FDA under an Emergency Use Authorization (EUA). This EUA will remain in effect (meaning this test can be used) for the duration of the COVID-19 declaration under Section 564(b)(1) of the Act, 21 U.S.C. section 360bbb-3(b)(1), unless the authorization is terminated or revoked.  Performed at [facility], [address] [place], [place] [address]   Urine Culture     Status: None   Collection Time: 11/27/21 10:25 AM   Specimen: In/Out Cath Urine  Result Value Ref Range Status   Specimen Description   Final    IN/OUT CATH URINE Performed at [facility], [address] [place], [place] [address]    Special Requests   Final    NONE Performed at [facility], [address] [place], [place] [address]    Culture   Final    NO GROWTH Performed at [facility], [place] [address], [place], [place] [address]    Report Status 11/28/2021 FINAL  Final         Radiology Studies: CT HEAD WO CONTRAST (5MM)  Result Date: 11/27/2021 CLINICAL DATA:  Mental status change, unknown cause. EXAM: CT HEAD WITHOUT CONTRAST TECHNIQUE: Contiguous axial images were obtained from the base of the skull through the vertex without intravenous contrast. RADIATION DOSE REDUCTION: This exam was performed according to the departmental dose-optimization program which includes automated exposure control, adjustment of the mA and/or kV according to patient size and/or use of iterative reconstruction technique. COMPARISON:  Head CT 11/01/2021 FINDINGS: Brain: There is no evidence of an acute infarct, intracranial hemorrhage, mass, midline shift, or extra-axial fluid collection. Mild cerebral atrophy is within  normal limits for age. No age advanced white matter disease is evident. Vascular: Calcified atherosclerosis at the skull base. No hyperdense vessel. Skull: No fracture or suspicious osseous lesion. Sinuses/Orbits: Chronic right sphenoid sinusitis. Clear mastoid air cells. Bilateral cataract extraction. Other: None. IMPRESSION: No evidence of acute intracranial abnormality. Electronically Signed   By: Logan Bores M.D.   On: 11/27/2021 14:17   DG Chest Portable 1 View  Result Date: 11/27/2021 CLINICAL DATA:  Shortness of breath EXAM: PORTABLE CHEST 1 VIEW COMPARISON:  11/01/2021 FINDINGS: Bilateral interstitial thickening. No focal consolidation. No pleural effusion or pneumothorax. Stable cardiomegaly. No acute osseous abnormality. IMPRESSION: Cardiomegaly with pulmonary vascular congestion. Electronically Signed   By: Kathreen Devoid M.D.   On: 11/27/2021 10:35            LOS: 1 day  Emeterio Reeve, DO [facility] 11/28/2021, 4:28 PM   Staff may message me via secure chat in [place]  but this may not receive immediate response,  please page for urgent matters!  If 7PM-7AM, please contact night-coverage [email or web address]  Dictation software was used to generate the above note. Typos may occur and escape review, as with typed/written notes. Please contact Dr Sheppard Coil directly for clarity if needed.

## 2021-11-29 ENCOUNTER — Inpatient Hospital Stay
Admit: 2021-11-29 | Discharge: 2021-11-29 | Disposition: A | Discharge status: Home / Self Care | Payer: Medicare Other | Attending: Osteopathic Medicine | Admitting: Osteopathic Medicine

## 2021-11-29 DIAGNOSIS — G9341 Metabolic encephalopathy: Secondary | ICD-10-CM

## 2021-11-29 DIAGNOSIS — I5033 Acute on chronic diastolic (congestive) heart failure: Secondary | ICD-10-CM | POA: Diagnosis not present

## 2021-11-29 DIAGNOSIS — I482 Chronic atrial fibrillation, unspecified: Secondary | ICD-10-CM | POA: Diagnosis not present

## 2021-11-29 DIAGNOSIS — J9601 Acute respiratory failure with hypoxia: Secondary | ICD-10-CM | POA: Diagnosis not present

## 2021-11-29 DIAGNOSIS — J441 Chronic obstructive pulmonary disease with (acute) exacerbation: Secondary | ICD-10-CM | POA: Diagnosis not present

## 2021-11-29 LAB — BASIC METABOLIC PANEL
Anion gap: 8 (ref 5–15)
BUN: 40 mg/dL — ABNORMAL HIGH (ref 8–23)
CO2: 31 mmol/L (ref 22–32)
Calcium: 10.3 mg/dL (ref 8.9–10.3)
Chloride: 100 mmol/L (ref 98–111)
Creatinine, Ser: 1.31 mg/dL — ABNORMAL HIGH (ref 0.44–1.00)
GFR, Estimated: 39 mL/min — ABNORMAL LOW (ref >60)
Glucose, Bld: 104 mg/dL — ABNORMAL HIGH (ref 70–99)
Potassium: 3.9 mmol/L (ref 3.5–5.1)
Sodium: 139 mmol/L (ref 135–145)

## 2021-11-29 LAB — ECHOCARDIOGRAM COMPLETE
AR max vel: 2.2 cm2
AV Area VTI: 2.66 cm2
AV Area mean vel: 2.32 cm2
AV Mean grad: 4 mmHg
AV Peak grad: 6.2 mmHg
Ao pk vel: 1.24 m/s
Area-P 1/2: 3.53 cm2
Height: 64 in
S' Lateral: 2.9 cm
Weight: 4567.93 oz

## 2021-11-29 LAB — CBC
HCT: 34.6 % — ABNORMAL LOW (ref 36.0–46.0)
Hemoglobin: 10.6 g/dL — ABNORMAL LOW (ref 12.0–15.0)
MCH: 26.6 pg (ref 26.0–34.0)
MCHC: 30.6 g/dL (ref 30.0–36.0)
MCV: 86.9 fL (ref 80.0–100.0)
Platelets: 218 10*3/uL (ref 150–400)
RBC: 3.98 MIL/uL (ref 3.87–5.11)
RDW: 15.4 % (ref 11.5–15.5)
WBC: 12.1 10*3/uL — ABNORMAL HIGH (ref 4.0–10.5)
nRBC: 0 % (ref 0.0–0.2)

## 2021-11-29 LAB — GLUCOSE, CAPILLARY: Glucose-Capillary: 82 mg/dL (ref 70–99)

## 2021-11-29 MED ORDER — TRIAMCINOLONE ACETONIDE 0.1 % EX CREA
1.0000 | TOPICAL_CREAM | Freq: Two times a day (BID) | CUTANEOUS | Status: DC | PRN
  Administered 2021-11-30 – 2021-12-02 (×3): 1 via TOPICAL

## 2021-11-29 MED ORDER — GUAIFENESIN-DM 100-10 MG/5ML PO SYRP: 5.0000 mL | ORAL_SOLUTION | ORAL | Status: DC | PRN

## 2021-11-29 MED ORDER — HYDROCOD POLI-CHLORPHE POLI ER 10-8 MG/5ML PO SUER
5.0000 mL | Freq: Two times a day (BID) | ORAL | Status: DC | PRN
  Administered 2021-11-29: 5 mL via ORAL
  Filled 2021-11-29: qty 5

## 2021-11-29 MED ORDER — APIXABAN 5 MG PO TABS
5.0000 mg | ORAL_TABLET | Freq: Two times a day (BID) | ORAL | Status: DC
  Administered 2021-11-29 – 2021-12-02 (×6): 5 mg via ORAL
  Filled 2021-11-29 (×6): qty 1

## 2021-11-29 MED ORDER — LORAZEPAM 0.5 MG PO TABS
0.5000 mg | ORAL_TABLET | Freq: Four times a day (QID) | ORAL | Status: DC | PRN
  Administered 2021-11-29 – 2021-12-01 (×5): 0.5 mg via ORAL
  Filled 2021-11-29 (×6): qty 1

## 2021-11-29 MED ORDER — FUROSEMIDE 40 MG PO TABS
40.0000 mg | ORAL_TABLET | Freq: Every day | ORAL | Status: DC
  Administered 2021-11-29 – 2021-11-30 (×2): 40 mg via ORAL
  Filled 2021-11-29 (×2): qty 1

## 2021-11-29 NOTE — Progress Notes (Signed)
*  PRELIMINARY RESULTS* Echocardiogram 2D Echocardiogram has been performed.  [name] 11/29/2021, 9:56 AM

## 2021-11-29 NOTE — Progress Notes (Signed)
PROGRESS NOTE    [name]   ZOX:[id number] DOB: Mar 18, 1930  DOA: 11/27/2021 Date of Service: 11/29/21 PCP: Orvis Brill, [facility]     Brief Narrative / Hospital Course:  [name] is a 88 y.o. female with medical history significant of COPD not on oxygen, asthma, hypothyroidism, gout, depression, OSA on CPAP, PVD, MRSA, dCHF, atrial fibrillation on Eliquis, CKD-3A, UTI, hyperparathyroidism, hypercalcemia, lymphedema, early stage of dementia, leg cellulitis, who presents via EMS from [place] 11/27/2021 with AMS and SOB. Patient was recently hospitalized from 9/8 - 9/10 due to altered mental status.  Patient had negative work-up including negative CT of head, normal vitamin B1 level and slightly elevated TSH 4.569.  Her altered mental status was thought possibly due to delirium in the setting of dementia. Per her daughter, patient has been confused again in the past several days, with agitation.  At her normal baseline before last admission, patient was oriented x3.  Today patient is confused. Per her daughter, patient has short of breath and dry cough, does not seem to have chest pain.  No active nausea ,vomiting, diarrhea or abdominal pain.  No symptoms of UTI.  Patient has bilateral leg edema and wheezing. 10/04: oxygen desaturation to 80% on room air, with acute respiratory distress.  BiPAP was started in ED. BNP 129.7, troponin level 13, negative COVID PCR, urinalysis negative except for rare bacteria, stable renal function, temperature normal, blood pressure 112/86, heart rate 99, RR 23.  ABG with pH 7.44, CO2 50, O2 104 on BiPAP.  Chest x-ray showed cardiomegaly and vascular congestion.  Patient was admitted to PCU as inpatient for respiratory failure with hypoxia likely due to HFpEF exacerbation + COPD exacerbation. 10/05: Net IO documented Since Admission: -1,875 mL [11/28/21 0801]. Remains on BiPap in the morning but off in the afternoon and maintaining on 4L [place].  Hypotensive in  afternoon/evening.  Held antihypertensives, started on midodrine.  ICU team aware in case worse. 10/06: Bump in creatinine, reduced Lasix down to p.o. Net IO Since Admission: -1,960 mL [11/29/21 0851] but suspect possible error, minimal UOP documented yesterday. Pt reports significant anxiety related to respiratory status. Echo done and pending read     Consultants:  none  Procedures: none      ASSESSMENT & PLAN:   Principal Problem:   Acute respiratory failure with hypoxia ([place]) Active Problems:   Acute on chronic diastolic CHF (congestive heart failure) (HCC)   COPD with acute exacerbation (HCC)   Atrial fibrillation, chronic (HCC)   Chronic kidney disease, stage 3a (HCC)   Hypercalcemia   Hyperparathyroidism ([place])   Hypothyroidism   Hypertension   Acute metabolic encephalopathy   OSA (obstructive sleep apnea)   Morbid obesity with BMI of 45.0-49.9, adult ([place])   Depression with anxiety   Acute respiratory failure with hypoxia (HCC) requiring BiPap Due to CHF/COPD see individual A/P below  -Admitted to PCU as inpatient -wean off BiPAP as able, ok to use prn/qhs  -Nasal cannula oxygen to maintain oxygen saturation above 93% when patient is off BiPAP  Acute on chronic diastolic CHF (congestive heart failure) ([place]) 2D echo on 07/07/2021 showed EF of 55 to 60%. -Lasix 60 mg bid by IV --> reduced given elevation creatinine  -Daily weights -strict I/O's -Low salt diet -Fluid restriction  COPD with acute exacerbation (HCC) -Bronchodilators -Will not give steroids since patient is intolerant to prednisone -As needed Mucinex -Incentive spirometry  Acute metabolic encephalopathy - resolved Etiology is not clear.  Urinalysis negative.  Potential differential diagnosis include delirium, hypoxia, progression of dementia.  -neurochecks -alert off BiPap  -Follow-up CT of head --> negative  Atrial fibrillation, chronic (HCC) -Continue Eliquis -Continue  metoprolol  Chronic kidney disease, stage 3a (HCC) Renal function dropped a bit today, reduced lasix  -Follow-up BMP  Hypercalcemia Hyperparathyroidism ([place]) Hypercalcemia due to hyperparathyroidism: Calcium slightly elevated 10.5. -Follow up with BMP  Hypothyroidism - Synthroid  Hypertension - IV hydralazine as needed -Lasix for CHF -Metoprolol for AFib rate control   OSA (obstructive sleep apnea) -Patient is on BiPAP overnight, [place] while awake -Restart CPAP qhs when patient is able to be off BiPAP a bit longer   Morbid obesity with BMI of 45.0-49.9, adult (HCC) BMI= 48.92  and BW= 131.7kg -Diet and exercise.   -Encourage to lose weight.  Depression with anxiety - Continue home medications  Advance care planning: Discussion with patient/daughter I do not think it is beneficial to decline intubation but request CPR if cardiac arrest.  Educated that CPR (even if successful ROSC) without intubation to maintain hemodynamic and respiratory stability, is not typically helpful.  Patient maintains that she would want CPR attempted, but would not want to be intubated under any circumstances.  We will revisit this conversation again.    DVT prophylaxis: Continue home Eliquis now that she is able to take p.o. Pertinent IV fluids/nutrition: no IV fluids in setting of CHF. Heart healthy diet.  Central lines / invasive devices: none  Code Status: CPR but no intubation - see above, will revisit as needed Family Communication: daughter planning to visit this afternoon, will attempt to engage at bedside    Disposition: Inpatient TOC needs: Pending clinical improvement, may benefit from HH/SNF Barriers to discharge / significant pending items: Oxygen requirement, treatment acute conditions as above. Prognosis is uncertain - awaiting Echo results and clinical response              Subjective:  Very anxious today, tearful on exam and interview. Declined chaplain         Objective:  Vitals:   11/29/21 0425 11/29/21 0753 11/29/21 1039 11/29/21 1233  BP:   (!) 111/50 114/74  Pulse:   (!) 108 96  Resp:   15 18  Temp:   98.3 F (36.8 C)   TempSrc:   Oral   SpO2:  98% 91% 98%  Weight: 129.5 kg     Height:        Intake/Output Summary (Last 24 hours) at 11/29/2021 1357 Last data filed at 11/29/2021 0400 Gross per 24 hour  Intake 340 ml  Output 425 ml  Net -85 ml   Filed Weights   11/27/21 1945 11/28/21 0235 11/29/21 0425  Weight: 131.3 kg 131.3 kg 129.5 kg    Examination:   Constitutional:  VS as above General Appearance: alert, mild distress d/t anxiety, tearful  Respiratory: Normal respiratory effort (+)diffuse  wheeze + rhonchi (+)basilar rales Cardiovascular: S1/S2 normal, RRR No rub/gallop auscultated No lower extremity edema Gastrointestinal: No tenderness Musculoskeletal:  No clubbing/cyanosis of digits Symmetrical movement in all extremities Neurological: No cranial nerve deficit on limited exam Alert Psychiatric: Normal judgment/insight Anxious mood and affect       Scheduled Medications:   apixaban  5 mg Oral BID   cyanocobalamin  500 mcg Oral Daily   DULoxetine  60 mg Oral Daily   famotidine  20 mg Oral QHS   furosemide  40 mg Oral Daily   ipratropium-albuterol  3 mL Nebulization QID   levothyroxine  150 mcg Oral Daily   lidocaine  1 patch Transdermal QHS   loratadine  10 mg Oral Daily   metoprolol succinate  12.5 mg Oral QHS   midodrine  10 mg Oral TID WC   montelukast  10 mg Oral QHS   multivitamin-lutein  1 capsule Oral Daily   nystatin cream  1 Application Topical TID   mouth rinse  15 mL Mouth Rinse 4 times per day   pantoprazole  40 mg Oral Daily   risperiDONE  0.5 mg Oral BID    Continuous Infusions:   PRN Medications:  acetaminophen, acetaminophen, albuterol, chlorpheniramine-HYDROcodone, dextromethorphan-guaiFENesin, hydrALAZINE, LORazepam, menthol-cetylpyridinium, ondansetron  (ZOFRAN) IV, mouth rinse, traMADol, triamcinolone cream  Antimicrobials:  Anti-infectives (From admission, onward)    None       Data Reviewed: I have personally reviewed following labs and imaging studies  CBC: Recent Labs  Lab 11/27/21 1017 11/28/21 0557 11/29/21 0443  WBC 11.9* 10.2 12.1*  HGB 11.5* 12.2 10.6*  HCT 37.2 39.9 34.6*  MCV 86.7 84.5 86.9  PLT 234 240 761   Basic Metabolic Panel: Recent Labs  Lab 11/27/21 1017 11/28/21 0557 11/29/21 0443  NA 140 141 139  K 4.4 4.1 3.9  CL 105 100 100  CO2 32 31 31  GLUCOSE 126* 134* 104*  BUN 23 26* 40*  CREATININE 1.05* 0.98 1.31*  CALCIUM 10.5* 10.5* 10.3  MG 2.1  --   --    GFR: Estimated Creatinine Clearance: 38.9 mL/min (A) (by C-G formula based on SCr of 1.31 mg/dL (H)). Liver Function Tests: Recent Labs  Lab 11/27/21 1017  AST 17  ALT 10  ALKPHOS 81  BILITOT 0.6  PROT 7.1  ALBUMIN 3.5   No results for input(s): "LIPASE", "AMYLASE" in the last 168 hours. Recent Labs  Lab 11/27/21 1917  AMMONIA 10   Coagulation Profile: No results for input(s): "INR", "PROTIME" in the last 168 hours. Cardiac Enzymes: No results for input(s): "CKTOTAL", "CKMB", "CKMBINDEX", "TROPONINI" in the last 168 hours. BNP (last 3 results) No results for input(s): "PROBNP" in the last 8760 hours. HbA1C: No results for input(s): "HGBA1C" in the last 72 hours. CBG: Recent Labs  Lab 11/28/21 0822 11/29/21 0801  GLUCAP 113* 82   Lipid Profile: No results for input(s): "CHOL", "HDL", "LDLCALC", "TRIG", "CHOLHDL", "LDLDIRECT" in the last 72 hours. Thyroid Function Tests: No results for input(s): "TSH", "T4TOTAL", "FREET4", "T3FREE", "THYROIDAB" in the last 72 hours. Anemia Panel: No results for input(s): "VITAMINB12", "FOLATE", "FERRITIN", "TIBC", "IRON", "RETICCTPCT" in the last 72 hours. Urine analysis:    Component Value Date/Time   COLORURINE YELLOW (A) 11/27/2021 1025   APPEARANCEUR CLEAR (A) 11/27/2021 1025    APPEARANCEUR Clear 01/19/2013 1248   LABSPEC 1.018 11/27/2021 1025   LABSPEC 1.004 01/19/2013 1248   PHURINE 5.0 11/27/2021 1025   GLUCOSEU NEGATIVE 11/27/2021 1025   GLUCOSEU Negative 01/19/2013 1248   HGBUR NEGATIVE 11/27/2021 1025   BILIRUBINUR NEGATIVE 11/27/2021 1025   BILIRUBINUR Negative 01/19/2013 [place] 11/27/2021 1025   PROTEINUR NEGATIVE 11/27/2021 1025   NITRITE NEGATIVE 11/27/2021 1025   LEUKOCYTESUR NEGATIVE 11/27/2021 1025   LEUKOCYTESUR Negative 01/19/2013 1248   Sepsis Labs: '@LABRCNTIP'$ (procalcitonin:4,lacticidven:4)  Recent Results (from the past 240 hour(s))  Resp Panel by RT-PCR (Flu A&B, Covid) Anterior Nasal Swab     Status: None   Collection Time: 11/27/21 10:19 AM   Specimen: Anterior Nasal Swab  Result Value Ref Range Status   SARS Coronavirus 2 by RT  PCR NEGATIVE NEGATIVE Final    Comment: (NOTE) SARS-CoV-2 target nucleic acids are NOT DETECTED.  The SARS-CoV-2 RNA is generally detectable in upper respiratory specimens during the acute phase of infection. The lowest concentration of SARS-CoV-2 viral copies this assay can detect is 138 copies/mL. A negative result does not preclude SARS-Cov-2 infection and should not be used as the sole basis for treatment or other patient management decisions. A negative result may occur with  improper specimen collection/handling, submission of specimen other than nasopharyngeal swab, presence of viral mutation(s) within the areas targeted by this assay, and inadequate number of viral copies(<138 copies/mL). A negative result must be combined with clinical observations, patient history, and epidemiological information. The expected result is Negative.  Fact Sheet for Patients:  EntrepreneurPulse.com.au  Fact Sheet for Healthcare Providers:  IncredibleEmployment.be  This test is no t yet approved or cleared by the [place] FDA and  has been authorized  for detection and/or diagnosis of SARS-CoV-2 by FDA under an Emergency Use Authorization (EUA). This EUA will remain  in effect (meaning this test can be used) for the duration of the COVID-19 declaration under Section 564(b)(1) of the Act, 21 U.S.C.section 360bbb-3(b)(1), unless the authorization is terminated  or revoked sooner.       Influenza A by PCR NEGATIVE NEGATIVE Final   Influenza B by PCR NEGATIVE NEGATIVE Final    Comment: (NOTE) The Xpert Xpress SARS-CoV-2/FLU/RSV plus assay is intended as an aid in the diagnosis of influenza from Nasopharyngeal swab specimens and should not be used as a sole basis for treatment. Nasal washings and aspirates are unacceptable for Xpert Xpress SARS-CoV-2/FLU/RSV testing.  Fact Sheet for Patients: EntrepreneurPulse.com.au  Fact Sheet for Healthcare Providers: IncredibleEmployment.be  This test is not yet approved or cleared by the [place] FDA and has been authorized for detection and/or diagnosis of SARS-CoV-2 by FDA under an Emergency Use Authorization (EUA). This EUA will remain in effect (meaning this test can be used) for the duration of the COVID-19 declaration under Section 564(b)(1) of the Act, 21 U.S.C. section 360bbb-3(b)(1), unless the authorization is terminated or revoked.  Performed at [facility], [address], [place], [place] [address]   Urine Culture     Status: None   Collection Time: 11/27/21 10:25 AM   Specimen: In/Out Cath Urine  Result Value Ref Range Status   Specimen Description   Final    IN/OUT CATH URINE Performed at [facility], [address] [place], [place] [address]    Special Requests   Final    NONE Performed at [facility], [address], [place], [place] [address]    Culture   Final    NO GROWTH Performed at [facility], [place] [address], [place], [place] [address]    Report Status 11/28/2021 FINAL  Final          Radiology Studies: CT HEAD WO CONTRAST (5MM)  Result Date: 11/27/2021 CLINICAL DATA:  Mental status change, unknown cause. EXAM: CT HEAD WITHOUT CONTRAST TECHNIQUE: Contiguous axial images were obtained from the base of the skull through the vertex without intravenous contrast. RADIATION DOSE REDUCTION: This exam was performed according to the departmental dose-optimization program which includes automated exposure control, adjustment of the mA and/or kV according to patient size and/or use of iterative reconstruction technique. COMPARISON:  Head CT 11/01/2021 FINDINGS: Brain: There is no evidence of an acute infarct, intracranial hemorrhage, mass, midline shift, or extra-axial fluid collection. Mild cerebral atrophy  is within normal limits for age. No age advanced white matter disease is evident. Vascular: Calcified atherosclerosis at the skull base. No hyperdense vessel. Skull: No fracture or suspicious osseous lesion. Sinuses/Orbits: Chronic right sphenoid sinusitis. Clear mastoid air cells. Bilateral cataract extraction. Other: None. IMPRESSION: No evidence of acute intracranial abnormality. Electronically Signed   By: Logan Bores M.D.   On: 11/27/2021 14:17   DG Chest Portable 1 View  Result Date: 11/27/2021 CLINICAL DATA:  Shortness of breath EXAM: PORTABLE CHEST 1 VIEW COMPARISON:  11/01/2021 FINDINGS: Bilateral interstitial thickening. No focal consolidation. No pleural effusion or pneumothorax. Stable cardiomegaly. No acute osseous abnormality. IMPRESSION: Cardiomegaly with pulmonary vascular congestion. Electronically Signed   By: Kathreen Devoid M.D.   On: 11/27/2021 10:35            LOS: 2 days        Emeterio Reeve, DO [facility] 11/29/2021, 1:57 PM   Staff may message me via secure chat in [place]  but this may not receive immediate response,  please page for urgent matters!  If 7PM-7AM, please contact night-coverage [email or web address]  Dictation  software was used to generate the above note. Typos may occur and escape review, as with typed/written notes. Please contact Dr Sheppard Coil directly for clarity if needed.

## 2021-11-30 DIAGNOSIS — I5033 Acute on chronic diastolic (congestive) heart failure: Secondary | ICD-10-CM | POA: Diagnosis not present

## 2021-11-30 DIAGNOSIS — J441 Chronic obstructive pulmonary disease with (acute) exacerbation: Secondary | ICD-10-CM | POA: Diagnosis not present

## 2021-11-30 DIAGNOSIS — I482 Chronic atrial fibrillation, unspecified: Secondary | ICD-10-CM | POA: Diagnosis not present

## 2021-11-30 DIAGNOSIS — J9601 Acute respiratory failure with hypoxia: Secondary | ICD-10-CM | POA: Diagnosis not present

## 2021-11-30 LAB — CBC
HCT: 32.6 % — ABNORMAL LOW (ref 36.0–46.0)
Hemoglobin: 10 g/dL — ABNORMAL LOW (ref 12.0–15.0)
MCH: 26 pg (ref 26.0–34.0)
MCHC: 30.7 g/dL (ref 30.0–36.0)
MCV: 84.7 fL (ref 80.0–100.0)
Platelets: 203 10*3/uL (ref 150–400)
RBC: 3.85 MIL/uL — ABNORMAL LOW (ref 3.87–5.11)
RDW: 15 % (ref 11.5–15.5)
WBC: 9.9 10*3/uL (ref 4.0–10.5)
nRBC: 0 % (ref 0.0–0.2)

## 2021-11-30 LAB — BASIC METABOLIC PANEL
Anion gap: 4 — ABNORMAL LOW (ref 5–15)
BUN: 36 mg/dL — ABNORMAL HIGH (ref 8–23)
CO2: 32 mmol/L (ref 22–32)
Calcium: 10.2 mg/dL (ref 8.9–10.3)
Chloride: 102 mmol/L (ref 98–111)
Creatinine, Ser: 1.04 mg/dL — ABNORMAL HIGH (ref 0.44–1.00)
GFR, Estimated: 51 mL/min — ABNORMAL LOW (ref >60)
Glucose, Bld: 117 mg/dL — ABNORMAL HIGH (ref 70–99)
Potassium: 3.9 mmol/L (ref 3.5–5.1)
Sodium: 138 mmol/L (ref 135–145)

## 2021-11-30 LAB — GLUCOSE, CAPILLARY: Glucose-Capillary: 107 mg/dL — ABNORMAL HIGH (ref 70–99)

## 2021-11-30 NOTE — Progress Notes (Signed)
Patient on nasal cannula in no distress. Patient continues to pull at devices even while her eyes are closed. Unsafe to put bipap on at this time as she pulled svn tx and oxygen off on several occasions tonight. Bipap on sb

## 2021-11-30 NOTE — Progress Notes (Signed)
PROGRESS NOTE    [name]   HCW:[id number] DOB: 14-Jan-1931  DOA: 11/27/2021 Date of Service: 11/30/21 PCP: Orvis Brill, [facility]     Brief Narrative / Hospital Course:  [name] is a 88 y.o. female with medical history significant of COPD not on oxygen, asthma, hypothyroidism, gout, depression, OSA on CPAP, PVD, MRSA, dCHF, atrial fibrillation on Eliquis, CKD-3A, UTI, hyperparathyroidism, hypercalcemia, lymphedema, early stage of dementia, leg cellulitis, who presents via EMS from [place] 11/27/2021 with AMS and SOB. Patient was recently hospitalized from 9/8 - 9/10 due to altered mental status.  Patient had negative work-up including negative CT of head, normal vitamin B1 level and slightly elevated TSH 4.569.  Her altered mental status was thought possibly due to delirium in the setting of dementia. Per her daughter, patient has been confused again in the past several days, with agitation.  At her normal baseline before last admission, patient was oriented x3.  Today patient is confused. Per her daughter, patient has short of breath and dry cough, does not seem to have chest pain.  No active nausea ,vomiting, diarrhea or abdominal pain.  No symptoms of UTI.  Patient has bilateral leg edema and wheezing. 10/04: oxygen desaturation to 80% on room air, with acute respiratory distress.  BiPAP was started in ED. BNP 129.7, troponin level 13, negative COVID PCR, urinalysis negative except for rare bacteria, stable renal function, temperature normal, blood pressure 112/86, heart rate 99, RR 23.  ABG with pH 7.44, CO2 50, O2 104 on BiPAP.  Chest x-ray showed cardiomegaly and vascular congestion.  Patient was admitted to PCU as inpatient for respiratory failure with hypoxia likely due to HFpEF exacerbation + COPD exacerbation. 10/05: Net IO documented Since Admission: -1,875 mL [11/28/21 0801]. Remains on BiPap in the morning but off in the afternoon and maintaining on 4L [place].  Hypotensive in  afternoon/evening.  Held antihypertensives, started on midodrine.  ICU team aware in case worse. 10/06: Bump in creatinine to 1.4, reduced Lasix down to p.o. Net IO Since Admission: -1,960 mL [11/29/21 0851] but suspect possible error, minimal UOP documented yesterday. Pt reports significant anxiety related to respiratory status. Echo done and pending read  10/07: pt taking BiPap off overnight, saturating okay this mornign on 2L [place]. Cr improved to 1.04. Net IO Since Admission: -4,770 mL [11/30/21 0934]. Echo LVEF 50-55%, global hypokinesis, LVH, indeterminate diastolic fxn,     Consultants:  none  Procedures: Echocardiogram 11/29/2021:  LVEF 50-55%, global hypokinesis, LVH, indeterminate diastolic fxn      ASSESSMENT & PLAN:   Principal Problem:   Acute respiratory failure with hypoxia (HCC) Active Problems:   Acute on chronic diastolic CHF (congestive heart failure) (HCC)   COPD with acute exacerbation (HCC)   Atrial fibrillation, chronic (HCC)   Chronic kidney disease, stage 3a (HCC)   Hypercalcemia   Hyperparathyroidism ([place])   Hypothyroidism   Hypertension   Acute metabolic encephalopathy   OSA (obstructive sleep apnea)   Morbid obesity with BMI of 45.0-49.9, adult ([place])   Depression with anxiety   Acute respiratory failure with hypoxia (HCC) requiring BiPap Due to CHF/COPD see individual A/P below  wean off BiPAP as able, ok to use prn/qhs  Nasal cannula oxygen to maintain oxygen saturation above 93% when patient is off BiPAP  Acute on chronic diastolic CHF (congestive heart failure) ([place]) 2D echo on 07/07/2021 showed EF of 55 to 60%. Repeat this admission shows  LVEF 50-55%, global hypokinesis, LVH, indeterminate diastolic fxn,  Lasix 60 mg bid by IV --> reduced given elevation creatinine  Daily weights strict I/O's Low salt diet Fluid restriction  COPD with acute exacerbation (HCC) Bronchodilators Will not give steroids since patient is intolerant to  prednisone As needed Mucinex Incentive spirometry  Acute metabolic encephalopathy - resolved Etiology is not clear.  Urinalysis negative.  Potential differential diagnosis include delirium, hypoxia, progression of dementia.   Atrial fibrillation, chronic (HCC) Continue Eliquis Continue metoprolol  AKI on Chronic kidney disease, stage 3a ([place]) - AKI resolved Renal function dropped a bit today, reduced lasix  Follow-up BMP  Hypercalcemia Hyperparathyroidism ([place]) Hypercalcemia due to hyperparathyroidism: Calcium slightly elevated 10.5. Follow up with BMP  Hypothyroidism Synthroid  Hypertension IV hydralazine as needed Lasix for CHF Metoprolol for AFib rate control   OSA (obstructive sleep apnea) Patient is on BiPAP overnight but was refusing this last night, [place] while awake Restart CPAP qhs   Morbid obesity with BMI of 45.0-49.9, adult (HCC) BMI= 48.92  and BW= 131.7kg Diet and exercise.   Encourage to lose weight.  Depression with anxiety Continue home medications  Advance care planning: Discussion with patient/daughter advised it is not beneficial to decline intubation but request CPR if cardiac arrest. Educated that CPR (even if successful ROSC) without intubation to maintain hemodynamic and respiratory stability or correct underlying respiratory abnormality, is not typically helpful.  Patient maintains that she would want CPR attempted, but would not want to be intubated under any circumstances.  We will revisit this conversation again.    DVT prophylaxis: Continue home Eliquis now that she is able to take p.o. Pertinent IV fluids/nutrition: no IV fluids in setting of CHF. Heart healthy diet.  Central lines / invasive devices: none  Code Status: CPR but no intubation - see above, will revisit as needed Family Communication: will attempt to reach daughter yesterday   Disposition: Inpatient TOC needs: Pending clinical improvement, may benefit from HH/SNF Barriers  to discharge / significant pending items: Oxygen requirement, treatment acute conditions as above. Prognosis is uncertain but appears to be slowly improving              Subjective:  Pt resting this morning but rousable, following commands. Asks to sleep some more, will reassess this afternoon.         Objective:  Vitals:   11/30/21 0022 11/30/21 0441 11/30/21 0444 11/30/21 0815  BP: (!) 116/59  124/63 134/65  Pulse: 67  (!) 108 60  Resp: 17   16  Temp: (!) 97.5 F (36.4 C)  98.2 F (36.8 C) 97.8 F (36.6 C)  TempSrc: Axillary  Oral   SpO2: 100%  98% 99%  Weight:  128.8 kg    Height:        Intake/Output Summary (Last 24 hours) at 11/30/2021 1007 Last data filed at 11/30/2021 0542 Gross per 24 hour  Intake 240 ml  Output 3050 ml  Net -2810 ml   Filed Weights   11/28/21 0235 11/29/21 0425 11/30/21 0441  Weight: 131.3 kg 129.5 kg 128.8 kg    Examination:  Constitutional:  VS as above General Appearance: lethargic but rousable, NAD Respiratory: Normal respiratory effort (+)diffuse  wheeze + rhonchi (+)basilar rales Cardiovascular: S1/S2 normal, RRR No rub/gallop auscultated No lower extremity edema appreciated but legs are wrapped  Gastrointestinal: No tenderness Musculoskeletal:  No clubbing/cyanosis of digits Symmetrical movement in all extremities Neurological: No cranial nerve deficit on limited exam Alert Psychiatric: Fair judgment/insight        Scheduled Medications:  apixaban  5 mg Oral BID   cyanocobalamin  500 mcg Oral Daily   DULoxetine  60 mg Oral Daily   famotidine  20 mg Oral QHS   furosemide  40 mg Oral Daily   ipratropium-albuterol  3 mL Nebulization QID   levothyroxine  150 mcg Oral Daily   lidocaine  1 patch Transdermal QHS   loratadine  10 mg Oral Daily   metoprolol succinate  12.5 mg Oral QHS   midodrine  10 mg Oral TID WC   montelukast  10 mg Oral QHS   multivitamin-lutein  1 capsule Oral Daily   nystatin  cream  1 Application Topical TID   mouth rinse  15 mL Mouth Rinse 4 times per day   pantoprazole  40 mg Oral Daily   risperiDONE  0.5 mg Oral BID    Continuous Infusions:   PRN Medications:  acetaminophen, acetaminophen, albuterol, chlorpheniramine-HYDROcodone, dextromethorphan-guaiFENesin, hydrALAZINE, LORazepam, menthol-cetylpyridinium, ondansetron (ZOFRAN) IV, mouth rinse, traMADol, triamcinolone cream  Antimicrobials:  Anti-infectives (From admission, onward)    None       Data Reviewed: I have personally reviewed following labs and imaging studies  CBC: Recent Labs  Lab 11/27/21 1017 11/28/21 0557 11/29/21 0443 11/30/21 0619  WBC 11.9* 10.2 12.1* 9.9  HGB 11.5* 12.2 10.6* 10.0*  HCT 37.2 39.9 34.6* 32.6*  MCV 86.7 84.5 86.9 84.7  PLT 234 240 218 063   Basic Metabolic Panel: Recent Labs  Lab 11/27/21 1017 11/28/21 0557 11/29/21 0443 11/30/21 0619  NA 140 141 139 138  K 4.4 4.1 3.9 3.9  CL 105 100 100 102  CO2 32 31 31 32  GLUCOSE 126* 134* 104* 117*  BUN 23 26* 40* 36*  CREATININE 1.05* 0.98 1.31* 1.04*  CALCIUM 10.5* 10.5* 10.3 10.2  MG 2.1  --   --   --    GFR: Estimated Creatinine Clearance: 48.8 mL/min (A) (by C-G formula based on SCr of 1.04 mg/dL (H)). Liver Function Tests: Recent Labs  Lab 11/27/21 1017  AST 17  ALT 10  ALKPHOS 81  BILITOT 0.6  PROT 7.1  ALBUMIN 3.5   No results for input(s): "LIPASE", "AMYLASE" in the last 168 hours. Recent Labs  Lab 11/27/21 1917  AMMONIA 10   Coagulation Profile: No results for input(s): "INR", "PROTIME" in the last 168 hours. Cardiac Enzymes: No results for input(s): "CKTOTAL", "CKMB", "CKMBINDEX", "TROPONINI" in the last 168 hours. BNP (last 3 results) No results for input(s): "PROBNP" in the last 8760 hours. HbA1C: No results for input(s): "HGBA1C" in the last 72 hours. CBG: Recent Labs  Lab 11/28/21 0822 11/29/21 0801 11/30/21 0815  GLUCAP 113* 82 107*   Lipid Profile: No  results for input(s): "CHOL", "HDL", "LDLCALC", "TRIG", "CHOLHDL", "LDLDIRECT" in the last 72 hours. Thyroid Function Tests: No results for input(s): "TSH", "T4TOTAL", "FREET4", "T3FREE", "THYROIDAB" in the last 72 hours. Anemia Panel: No results for input(s): "VITAMINB12", "FOLATE", "FERRITIN", "TIBC", "IRON", "RETICCTPCT" in the last 72 hours. Urine analysis:    Component Value Date/Time   COLORURINE YELLOW (A) 11/27/2021 1025   APPEARANCEUR CLEAR (A) 11/27/2021 1025   APPEARANCEUR Clear 01/19/2013 1248   LABSPEC 1.018 11/27/2021 1025   LABSPEC 1.004 01/19/2013 1248   PHURINE 5.0 11/27/2021 1025   GLUCOSEU NEGATIVE 11/27/2021 1025   GLUCOSEU Negative 01/19/2013 1248   HGBUR NEGATIVE 11/27/2021 1025   BILIRUBINUR NEGATIVE 11/27/2021 1025   BILIRUBINUR Negative 01/19/2013 [place] 11/27/2021 1025   PROTEINUR NEGATIVE 11/27/2021 1025  NITRITE NEGATIVE 11/27/2021 1025   LEUKOCYTESUR NEGATIVE 11/27/2021 1025   LEUKOCYTESUR Negative 01/19/2013 1248   Sepsis Labs: '@LABRCNTIP'$ (procalcitonin:4,lacticidven:4)  Recent Results (from the past 240 hour(s))  Resp Panel by RT-PCR (Flu A&B, Covid) Anterior Nasal Swab     Status: None   Collection Time: 11/27/21 10:19 AM   Specimen: Anterior Nasal Swab  Result Value Ref Range Status   SARS Coronavirus 2 by RT PCR NEGATIVE NEGATIVE Final    Comment: (NOTE) SARS-CoV-2 target nucleic acids are NOT DETECTED.  The SARS-CoV-2 RNA is generally detectable in upper respiratory specimens during the acute phase of infection. The lowest concentration of SARS-CoV-2 viral copies this assay can detect is 138 copies/mL. A negative result does not preclude SARS-Cov-2 infection and should not be used as the sole basis for treatment or other patient management decisions. A negative result may occur with  improper specimen collection/handling, submission of specimen other than nasopharyngeal swab, presence of viral mutation(s) within  the areas targeted by this assay, and inadequate number of viral copies(<138 copies/mL). A negative result must be combined with clinical observations, patient history, and epidemiological information. The expected result is Negative.  Fact Sheet for Patients:  EntrepreneurPulse.com.au  Fact Sheet for Healthcare Providers:  IncredibleEmployment.be  This test is no t yet approved or cleared by the [place] FDA and  has been authorized for detection and/or diagnosis of SARS-CoV-2 by FDA under an Emergency Use Authorization (EUA). This EUA will remain  in effect (meaning this test can be used) for the duration of the COVID-19 declaration under Section 564(b)(1) of the Act, 21 U.S.C.section 360bbb-3(b)(1), unless the authorization is terminated  or revoked sooner.       Influenza A by PCR NEGATIVE NEGATIVE Final   Influenza B by PCR NEGATIVE NEGATIVE Final    Comment: (NOTE) The Xpert Xpress SARS-CoV-2/FLU/RSV plus assay is intended as an aid in the diagnosis of influenza from Nasopharyngeal swab specimens and should not be used as a sole basis for treatment. Nasal washings and aspirates are unacceptable for Xpert Xpress SARS-CoV-2/FLU/RSV testing.  Fact Sheet for Patients: EntrepreneurPulse.com.au  Fact Sheet for Healthcare Providers: IncredibleEmployment.be  This test is not yet approved or cleared by the [place] FDA and has been authorized for detection and/or diagnosis of SARS-CoV-2 by FDA under an Emergency Use Authorization (EUA). This EUA will remain in effect (meaning this test can be used) for the duration of the COVID-19 declaration under Section 564(b)(1) of the Act, 21 U.S.C. section 360bbb-3(b)(1), unless the authorization is terminated or revoked.  Performed at [facility], [address] [place], [place] [address]   Urine Culture     Status: None   Collection Time:  11/27/21 10:25 AM   Specimen: In/Out Cath Urine  Result Value Ref Range Status   Specimen Description   Final    IN/OUT CATH URINE Performed at [facility], [address] [place], [place] [address]    Special Requests   Final    NONE Performed at [facility], [address] [place], [place] [address]    Culture   Final    NO GROWTH Performed at [facility], [place] [address] [place],  [address]    Report Status 11/28/2021 FINAL  Final         Radiology Studies: CT HEAD WO CONTRAST (5MM)  Result Date: 11/27/2021 CLINICAL DATA:  Mental status change, unknown cause. EXAM: CT HEAD WITHOUT CONTRAST TECHNIQUE: Contiguous axial images were obtained from  the base of the skull through the vertex without intravenous contrast. RADIATION DOSE REDUCTION: This exam was performed according to the departmental dose-optimization program which includes automated exposure control, adjustment of the mA and/or kV according to patient size and/or use of iterative reconstruction technique. COMPARISON:  Head CT 11/01/2021 FINDINGS: Brain: There is no evidence of an acute infarct, intracranial hemorrhage, mass, midline shift, or extra-axial fluid collection. Mild cerebral atrophy is within normal limits for age. No age advanced white matter disease is evident. Vascular: Calcified atherosclerosis at the skull base. No hyperdense vessel. Skull: No fracture or suspicious osseous lesion. Sinuses/Orbits: Chronic right sphenoid sinusitis. Clear mastoid air cells. Bilateral cataract extraction. Other: None. IMPRESSION: No evidence of acute intracranial abnormality. Electronically Signed   By: Logan Bores M.D.   On: 11/27/2021 14:17   DG Chest Portable 1 View  Result Date: 11/27/2021 CLINICAL DATA:  Shortness of breath EXAM: PORTABLE CHEST 1 VIEW COMPARISON:  11/01/2021 FINDINGS: Bilateral interstitial thickening. No focal consolidation. No pleural effusion or pneumothorax. Stable  cardiomegaly. No acute osseous abnormality. IMPRESSION: Cardiomegaly with pulmonary vascular congestion. Electronically Signed   By: Kathreen Devoid M.D.   On: 11/27/2021 10:35            LOS: 3 days        Emeterio Reeve, DO [facility] 11/30/2021, 10:07 AM   Staff may message me via secure chat in [place]  but this may not receive immediate response,  please page for urgent matters!  If 7PM-7AM, please contact night-coverage [email or web address]  Dictation software was used to generate the above note. Typos may occur and escape review, as with typed/written notes. Please contact Dr Sheppard Coil directly for clarity if needed.

## 2021-11-30 NOTE — Progress Notes (Signed)
Pt taking Bipap off, not wanting to keep on. Placed back on O2'@2'$  liters [place].

## 2021-12-01 DIAGNOSIS — J9601 Acute respiratory failure with hypoxia: Secondary | ICD-10-CM | POA: Diagnosis not present

## 2021-12-01 DIAGNOSIS — I5033 Acute on chronic diastolic (congestive) heart failure: Secondary | ICD-10-CM | POA: Diagnosis not present

## 2021-12-01 DIAGNOSIS — J441 Chronic obstructive pulmonary disease with (acute) exacerbation: Secondary | ICD-10-CM | POA: Diagnosis not present

## 2021-12-01 DIAGNOSIS — I482 Chronic atrial fibrillation, unspecified: Secondary | ICD-10-CM | POA: Diagnosis not present

## 2021-12-01 LAB — BASIC METABOLIC PANEL
Anion gap: 3 — ABNORMAL LOW (ref 5–15)
BUN: 24 mg/dL — ABNORMAL HIGH (ref 8–23)
CO2: 35 mmol/L — ABNORMAL HIGH (ref 22–32)
Calcium: 10.4 mg/dL — ABNORMAL HIGH (ref 8.9–10.3)
Chloride: 100 mmol/L (ref 98–111)
Creatinine, Ser: 0.91 mg/dL (ref 0.44–1.00)
GFR, Estimated: >60 mL/min (ref >60)
Glucose, Bld: 104 mg/dL — ABNORMAL HIGH (ref 70–99)
Potassium: 3.3 mmol/L — ABNORMAL LOW (ref 3.5–5.1)
Sodium: 138 mmol/L (ref 135–145)

## 2021-12-01 LAB — GLUCOSE, CAPILLARY: Glucose-Capillary: 103 mg/dL — ABNORMAL HIGH (ref 70–99)

## 2021-12-01 MED ORDER — IPRATROPIUM-ALBUTEROL 0.5-2.5 (3) MG/3ML IN SOLN
3.0000 mL | Freq: Two times a day (BID) | RESPIRATORY_TRACT | Status: DC
  Administered 2021-12-02: 3 mL via RESPIRATORY_TRACT
  Filled 2021-12-01: qty 3

## 2021-12-01 MED ORDER — POTASSIUM CHLORIDE 10 MEQ/100ML IV SOLN
10.0000 meq | INTRAVENOUS | Status: DC
  Filled 2021-12-01 (×3): qty 100

## 2021-12-01 MED ORDER — POTASSIUM CHLORIDE CRYS ER 20 MEQ PO TBCR
40.0000 meq | EXTENDED_RELEASE_TABLET | Freq: Once | ORAL | Status: AC
  Administered 2021-12-01: 40 meq via ORAL
  Filled 2021-12-01: qty 2

## 2021-12-01 MED ORDER — FUROSEMIDE 40 MG PO TABS
80.0000 mg | ORAL_TABLET | Freq: Two times a day (BID) | ORAL | Status: AC
  Administered 2021-12-01 (×2): 80 mg via ORAL
  Filled 2021-12-01 (×2): qty 2

## 2021-12-01 NOTE — Progress Notes (Signed)
PROGRESS NOTE    [name]   QBH:[id number] DOB: 08-27-1930  DOA: 11/27/2021 Date of Service: 12/01/21 PCP: Orvis Brill, [facility]     Brief Narrative / Hospital Course:  [name] is a 88 y.o. female with medical history significant of COPD not on oxygen, asthma, hypothyroidism, gout, depression, OSA on CPAP, PVD, MRSA, dCHF, atrial fibrillation on Eliquis, CKD-3A, UTI, hyperparathyroidism, hypercalcemia, lymphedema, early stage of dementia, leg cellulitis, who presents via EMS from [place] 11/27/2021 with AMS and SOB. Patient was recently hospitalized from 9/8 - 9/10 due to altered mental status.  Patient had negative work-up including negative CT of head, normal vitamin B1 level and slightly elevated TSH 4.569.  Her altered mental status was thought possibly due to delirium in the setting of dementia. Per her daughter, patient has been confused again in the past several days, with agitation.  At her normal baseline before last admission, patient was oriented x3.  Today patient is confused. Per her daughter, patient has short of breath and dry cough, does not seem to have chest pain.  No active nausea ,vomiting, diarrhea or abdominal pain.  No symptoms of UTI.  Patient has bilateral leg edema and wheezing. 10/04: oxygen desaturation to 80% on room air, with acute respiratory distress.  BiPAP was started in ED. BNP 129.7, troponin level 13, negative COVID PCR, urinalysis negative except for rare bacteria, stable renal function, temperature normal, blood pressure 112/86, heart rate 99, RR 23.  ABG with pH 7.44, CO2 50, O2 104 on BiPAP.  Chest x-ray showed cardiomegaly and vascular congestion.  Patient was admitted to PCU as inpatient for respiratory failure with hypoxia likely due to HFpEF exacerbation + COPD exacerbation. 10/05: Net IO documented Since Admission: -1,875 mL [11/28/21 0801]. Remains on BiPap in the morning but off in the afternoon and maintaining on 4L [place].  Hypotensive in  afternoon/evening.  Held antihypertensives, started on midodrine.  ICU team aware in case worse. 10/06: Bump in creatinine to 1.4, reduced Lasix down to p.o. Net IO Since Admission: -1,960 mL [11/29/21 0851] but suspect possible error, minimal UOP documented yesterday. Pt reports significant anxiety related to respiratory status. Echo done and pending read  10/07: pt taking BiPap off overnight, saturating okay this mornign on 2L [place]. Cr improved to 1.04. Net IO Since Admission: -4,770 mL [11/30/21 0934]. Echo LVEF 50-55%, global hypokinesis, LVH, indeterminate diastolic fxn,  09/73: not keeping O2 on, confused in AM. Spoke to daughter - I Lynnae Prude' think pt will be safe to go home, will get PT/OT. Pt is on hospice at home. I'm not convinced she's imminently end-of-life but may consider comfort measured depending on how she does over the next few days.     Consultants:  none  Procedures: Echocardiogram 11/29/2021:  LVEF 50-55%, global hypokinesis, LVH, indeterminate diastolic fxn      ASSESSMENT & PLAN:   Principal Problem:   Acute respiratory failure with hypoxia (HCC) Active Problems:   Acute on chronic diastolic CHF (congestive heart failure) (HCC)   COPD with acute exacerbation (HCC)   Atrial fibrillation, chronic (HCC)   Chronic kidney disease, stage 3a (HCC)   Hypercalcemia   Hyperparathyroidism ([place])   Hypothyroidism   Hypertension   Acute metabolic encephalopathy   OSA (obstructive sleep apnea)   Morbid obesity with BMI of 45.0-49.9, adult ([place])   Depression with anxiety   Acute respiratory failure with hypoxia (HCC) requiring BiPap Due to CHF/COPD see individual A/P below  wean off BiPAP as able,  ok to use prn/qhs  Nasal cannula oxygen to maintain oxygen saturation above 93% when patient is off BiPAP  Acute on chronic diastolic CHF (congestive heart failure) ([place]) 2D echo on 07/07/2021 showed EF of 55 to 60%. Repeat this admission shows  LVEF 50-55%, global hypokinesis,  LVH, indeterminate diastolic fxn,  Lasix 60 mg bid by IV --> reduced given elevation creatinine --> Cr better today  Daily weights strict I/O's Low salt diet Fluid restriction  COPD with acute exacerbation (HCC) Bronchodilators Will not give steroids since patient is intolerant to prednisone As needed Mucinex Incentive spirometry  Acute metabolic encephalopathy - resolved Etiology is not clear.  Urinalysis negative.  Potential differential diagnosis include delirium, hypoxia, progression of dementia.   Atrial fibrillation, chronic (HCC) Continue Eliquis Continue metoprolol  AKI on Chronic kidney disease, stage 3a ([place]) - AKI resolved Renal function dropped a bit today, reduced lasix  Follow-up BMP  Hypercalcemia Hyperparathyroidism ([place]) Hypercalcemia due to hyperparathyroidism: Calcium slightly elevated 10.5. Follow up with BMP  Hypothyroidism Synthroid  Hypertension IV hydralazine as needed Lasix for CHF Metoprolol for AFib rate control   OSA (obstructive sleep apnea) Patient is on BiPAP overnight but was refusing this last night, [place] while awake Restart CPAP qhs   Morbid obesity with BMI of 45.0-49.9, adult (HCC) BMI= 48.92  and BW= 131.7kg Diet and exercise.   Encourage to lose weight.  Depression with anxiety Continue home medications  Advance care planning: Discussion with patient/daughter early admission - advised it is not beneficial to decline intubation but request CPR if cardiac arrest. Educated that CPR (even if successful ROSC) without intubation to maintain hemodynamic and respiratory stability or correct underlying respiratory abnormality, is not typically helpful.  Patient maintains that she would want CPR attempted, but would not want to be intubated under any circumstances.  We will revisit this conversation again. 10/08 I spoke to daughter - I Lynnae Prude' think pt will be safe to go home, will get PT/OT. Pt is on hospice at home. I'm not convinced she's  imminently end-of-life but may consider comfort measured depending on how she does over the next few days.     DVT prophylaxis: Continue home Eliquis n. Pertinent IV fluids/nutrition: no IV fluids in setting of CHF. Heart healthy diet.  Central lines / invasive devices: none  Code Status: CPR but no intubation - see above, will revisit as needed Family Communication: spoke to daughter today 12/01/21 10:07 AM   Disposition: Inpatient TOC needs: Pending clinical improvement, may benefit from SNF/STR vs hospice care, hospice is already following outpatient Barriers to discharge / significant pending items: Oxygen requirement, treatment acute conditions as above. Prognosis is uncertain but appears to be slowly improving tho0ugh intermittent confusion              Subjective:  Pt more alert this morning but confused. Knows the year, thinks we are in her doctors office and she's been here 30 minutes. No complaints.        Objective:  Vitals:   12/01/21 0051 12/01/21 0154 12/01/21 0428 12/01/21 0500  BP:   117/67   Pulse:   88   Resp:   20   Temp:   98.1 F (36.7 C)   TempSrc:   Oral   SpO2:   99%   Weight: 128.8 kg 128.8 kg  126.5 kg  Height:        Intake/Output Summary (Last 24 hours) at 12/01/2021 1008 Last data filed at 12/01/2021 0500 Gross per 24  hour  Intake 237 ml  Output 1700 ml  Net -1463 ml   Filed Weights   12/01/21 0051 12/01/21 0154 12/01/21 0500  Weight: 128.8 kg 128.8 kg 126.5 kg    Examination:  Constitutional:  VS as above General Appearance: lethargic but rousable, NAD Respiratory: Normal respiratory effort (+)diffuse  wheeze + rhonchi a bit improved from previous  (+)basilar rales Cardiovascular: S1/S2 normal, RRR No rub/gallop auscultated No lower extremity edema appreciated but legs are wrapped  Gastrointestinal: No tenderness Musculoskeletal:  No clubbing/cyanosis of digits Symmetrical movement in all  extremities Neurological: No cranial nerve deficit on limited exam Alert Psychiatric: Fair judgment/insight        Scheduled Medications:   apixaban  5 mg Oral BID   cyanocobalamin  500 mcg Oral Daily   DULoxetine  60 mg Oral Daily   famotidine  20 mg Oral QHS   furosemide  80 mg Oral BID   ipratropium-albuterol  3 mL Nebulization QID   levothyroxine  150 mcg Oral Daily   lidocaine  1 patch Transdermal QHS   loratadine  10 mg Oral Daily   metoprolol succinate  12.5 mg Oral QHS   midodrine  10 mg Oral TID WC   montelukast  10 mg Oral QHS   multivitamin-lutein  1 capsule Oral Daily   nystatin cream  1 Application Topical TID   mouth rinse  15 mL Mouth Rinse 4 times per day   pantoprazole  40 mg Oral Daily   potassium chloride  40 mEq Oral Once   risperiDONE  0.5 mg Oral BID    Continuous Infusions:   PRN Medications:  acetaminophen, acetaminophen, albuterol, chlorpheniramine-HYDROcodone, dextromethorphan-guaiFENesin, hydrALAZINE, LORazepam, menthol-cetylpyridinium, ondansetron (ZOFRAN) IV, mouth rinse, traMADol, triamcinolone cream  Antimicrobials:  Anti-infectives (From admission, onward)    None       Data Reviewed: I have personally reviewed following labs and imaging studies  CBC: Recent Labs  Lab 11/27/21 1017 11/28/21 0557 11/29/21 0443 11/30/21 0619  WBC 11.9* 10.2 12.1* 9.9  HGB 11.5* 12.2 10.6* 10.0*  HCT 37.2 39.9 34.6* 32.6*  MCV 86.7 84.5 86.9 84.7  PLT 234 240 218 601   Basic Metabolic Panel: Recent Labs  Lab 11/27/21 1017 11/28/21 0557 11/29/21 0443 11/30/21 0619 12/01/21 0410  NA 140 141 139 138 138  K 4.4 4.1 3.9 3.9 3.3*  CL 105 100 100 102 100  CO2 32 31 31 32 35*  GLUCOSE 126* 134* 104* 117* 104*  BUN 23 26* 40* 36* 24*  CREATININE 1.05* 0.98 1.31* 1.04* 0.91  CALCIUM 10.5* 10.5* 10.3 10.2 10.4*  MG 2.1  --   --   --   --    GFR: Estimated Creatinine Clearance: 55.2 mL/min (by C-G formula based on SCr of 0.91  mg/dL). Liver Function Tests: Recent Labs  Lab 11/27/21 1017  AST 17  ALT 10  ALKPHOS 81  BILITOT 0.6  PROT 7.1  ALBUMIN 3.5   No results for input(s): "LIPASE", "AMYLASE" in the last 168 hours. Recent Labs  Lab 11/27/21 1917  AMMONIA 10   Coagulation Profile: No results for input(s): "INR", "PROTIME" in the last 168 hours. Cardiac Enzymes: No results for input(s): "CKTOTAL", "CKMB", "CKMBINDEX", "TROPONINI" in the last 168 hours. BNP (last 3 results) No results for input(s): "PROBNP" in the last 8760 hours. HbA1C: No results for input(s): "HGBA1C" in the last 72 hours. CBG: Recent Labs  Lab 11/28/21 0822 11/29/21 0801 11/30/21 0815 12/01/21 0842  GLUCAP 113* 82 107*  103*   Lipid Profile: No results for input(s): "CHOL", "HDL", "LDLCALC", "TRIG", "CHOLHDL", "LDLDIRECT" in the last 72 hours. Thyroid Function Tests: No results for input(s): "TSH", "T4TOTAL", "FREET4", "T3FREE", "THYROIDAB" in the last 72 hours. Anemia Panel: No results for input(s): "VITAMINB12", "FOLATE", "FERRITIN", "TIBC", "IRON", "RETICCTPCT" in the last 72 hours. Urine analysis:    Component Value Date/Time   COLORURINE YELLOW (A) 11/27/2021 1025   APPEARANCEUR CLEAR (A) 11/27/2021 1025   APPEARANCEUR Clear 01/19/2013 1248   LABSPEC 1.018 11/27/2021 1025   LABSPEC 1.004 01/19/2013 1248   PHURINE 5.0 11/27/2021 1025   GLUCOSEU NEGATIVE 11/27/2021 1025   GLUCOSEU Negative 01/19/2013 1248   HGBUR NEGATIVE 11/27/2021 1025   BILIRUBINUR NEGATIVE 11/27/2021 1025   BILIRUBINUR Negative 01/19/2013 1248   KETONESUR NEGATIVE 11/27/2021 1025   PROTEINUR NEGATIVE 11/27/2021 1025   NITRITE NEGATIVE 11/27/2021 1025   LEUKOCYTESUR NEGATIVE 11/27/2021 1025   LEUKOCYTESUR Negative 01/19/2013 1248   Sepsis Labs: '@LABRCNTIP'$ (procalcitonin:4,lacticidven:4)  Recent Results (from the past 240 hour(s))  Resp Panel by RT-PCR (Flu A&B, Covid) Anterior Nasal Swab     Status: None   Collection Time: 11/27/21  10:19 AM   Specimen: Anterior Nasal Swab  Result Value Ref Range Status   SARS Coronavirus 2 by RT PCR NEGATIVE NEGATIVE Final    Comment: (NOTE) SARS-CoV-2 target nucleic acids are NOT DETECTED.  The SARS-CoV-2 RNA is generally detectable in upper respiratory specimens during the acute phase of infection. The lowest concentration of SARS-CoV-2 viral copies this assay can detect is 138 copies/mL. A negative result does not preclude SARS-Cov-2 infection and should not be used as the sole basis for treatment or other patient management decisions. A negative result may occur with  improper specimen collection/handling, submission of specimen other than nasopharyngeal swab, presence of viral mutation(s) within the areas targeted by this assay, and inadequate number of viral copies(<138 copies/mL). A negative result must be combined with clinical observations, patient history, and epidemiological information. The expected result is Negative.  Fact Sheet for Patients:  EntrepreneurPulse.com.au  Fact Sheet for Healthcare Providers:  IncredibleEmployment.be  This test is no t yet approved or cleared by the [place] FDA and  has been authorized for detection and/or diagnosis of SARS-CoV-2 by FDA under an Emergency Use Authorization (EUA). This EUA will remain  in effect (meaning this test can be used) for the duration of the COVID-19 declaration under Section 564(b)(1) of the Act, 21 U.S.C.section 360bbb-3(b)(1), unless the authorization is terminated  or revoked sooner.       Influenza A by PCR NEGATIVE NEGATIVE Final   Influenza B by PCR NEGATIVE NEGATIVE Final    Comment: (NOTE) The Xpert Xpress SARS-CoV-2/FLU/RSV plus assay is intended as an aid in the diagnosis of influenza from Nasopharyngeal swab specimens and should not be used as a sole basis for treatment. Nasal washings and aspirates are unacceptable for Xpert Xpress  SARS-CoV-2/FLU/RSV testing.  Fact Sheet for Patients: EntrepreneurPulse.com.au  Fact Sheet for Healthcare Providers: IncredibleEmployment.be  This test is not yet approved or cleared by the [place] FDA and has been authorized for detection and/or diagnosis of SARS-CoV-2 by FDA under an Emergency Use Authorization (EUA). This EUA will remain in effect (meaning this test can be used) for the duration of the COVID-19 declaration under Section 564(b)(1) of the Act, 21 U.S.C. section 360bbb-3(b)(1), unless the authorization is terminated or revoked.  Performed at [facility], [address] [place], [place] [address]   Urine Culture  Status: None   Collection Time: 11/27/21 10:25 AM   Specimen: In/Out Cath Urine  Result Value Ref Range Status   Specimen Description   Final    IN/OUT CATH URINE Performed at [facility], [address] [place], [place] [address]    Special Requests   Final    NONE Performed at [facility], [address] [place], [place] [address]    Culture   Final    NO GROWTH Performed at [facility], [place] [address], [place], [place] [address]    Report Status 11/28/2021 FINAL  Final         Radiology Studies: CT HEAD WO CONTRAST (5MM)  Result Date: 11/27/2021 CLINICAL DATA:  Mental status change, unknown cause. EXAM: CT HEAD WITHOUT CONTRAST TECHNIQUE: Contiguous axial images were obtained from the base of the skull through the vertex without intravenous contrast. RADIATION DOSE REDUCTION: This exam was performed according to the departmental dose-optimization program which includes automated exposure control, adjustment of the mA and/or kV according to patient size and/or use of iterative reconstruction technique. COMPARISON:  Head CT 11/01/2021 FINDINGS: Brain: There is no evidence of an acute infarct, intracranial hemorrhage, mass, midline shift, or extra-axial fluid  collection. Mild cerebral atrophy is within normal limits for age. No age advanced white matter disease is evident. Vascular: Calcified atherosclerosis at the skull base. No hyperdense vessel. Skull: No fracture or suspicious osseous lesion. Sinuses/Orbits: Chronic right sphenoid sinusitis. Clear mastoid air cells. Bilateral cataract extraction. Other: None. IMPRESSION: No evidence of acute intracranial abnormality. Electronically Signed   By: Logan Bores M.D.   On: 11/27/2021 14:17   DG Chest Portable 1 View  Result Date: 11/27/2021 CLINICAL DATA:  Shortness of breath EXAM: PORTABLE CHEST 1 VIEW COMPARISON:  11/01/2021 FINDINGS: Bilateral interstitial thickening. No focal consolidation. No pleural effusion or pneumothorax. Stable cardiomegaly. No acute osseous abnormality. IMPRESSION: Cardiomegaly with pulmonary vascular congestion. Electronically Signed   By: Kathreen Devoid M.D.   On: 11/27/2021 10:35            LOS: 4 days        Emeterio Reeve, DO [facility] 12/01/2021, 10:08 AM   Staff may message me via secure chat in [place]  but this may not receive immediate response,  please page for urgent matters!  If 7PM-7AM, please contact night-coverage [email or web address]  Dictation software was used to generate the above note. Typos may occur and escape review, as with typed/written notes. Please contact Dr Sheppard Coil directly for clarity if needed.

## 2021-12-01 NOTE — Progress Notes (Signed)
PT Cancellation Note  Patient Details Name: [name] MRN: [id number] DOB: [birth year]   Cancelled Treatment:    Reason Eval/Treat Not Completed:  (Consult received and chart reviewed.  Per chart review, patient identified as hospice at home patient prior to admission; family's goals are to resume that at discharge.  Feel attempts at therapy in the past have been unsuccessful and prefer to 'keep her more comfortable' at this point.  Do not express interest in therapy involvement acutely or at discharge; prefer to maintain hospice benefit, follow up and pathway.  Will continue to follow at a distance to ensure final determination of goals of care and discharge disposition as patient medically optimized; however, will sign off if family continues to prefer hospice)  Of note, daughter endorses WC level as patient's primary mobility at [facility], completing transfers to/from recliner and commode with assist from staff as needed (with progressive decline over recent months).  Anticipate possible benefit of hoyer lift if transfer ability continues to decline.  If facility unable to manage care needs, may consider transition to LTC facility.  Dontavius Keim H. Owens Shark, PT, DPT, NCS 12/01/21, 3:48 PM [phone]

## 2021-12-02 ENCOUNTER — Telehealth (HOSPITAL_COMMUNITY): Payer: Self-pay | Admitting: Pharmacy Technician

## 2021-12-02 ENCOUNTER — Other Ambulatory Visit (HOSPITAL_COMMUNITY): Payer: Self-pay

## 2021-12-02 DIAGNOSIS — J9601 Acute respiratory failure with hypoxia: Secondary | ICD-10-CM | POA: Diagnosis not present

## 2021-12-02 LAB — CBC
HCT: 39 % (ref 36.0–46.0)
Hemoglobin: 12.1 g/dL (ref 12.0–15.0)
MCH: 26.2 pg (ref 26.0–34.0)
MCHC: 31 g/dL (ref 30.0–36.0)
MCV: 84.4 fL (ref 80.0–100.0)
Platelets: 251 10*3/uL (ref 150–400)
RBC: 4.62 MIL/uL (ref 3.87–5.11)
RDW: 15 % (ref 11.5–15.5)
WBC: 10 10*3/uL (ref 4.0–10.5)
nRBC: 0 % (ref 0.0–0.2)

## 2021-12-02 LAB — BASIC METABOLIC PANEL
Anion gap: 7 (ref 5–15)
BUN: 24 mg/dL — ABNORMAL HIGH (ref 8–23)
CO2: 31 mmol/L (ref 22–32)
Calcium: 10.8 mg/dL — ABNORMAL HIGH (ref 8.9–10.3)
Chloride: 101 mmol/L (ref 98–111)
Creatinine, Ser: 1.01 mg/dL — ABNORMAL HIGH (ref 0.44–1.00)
GFR, Estimated: 53 mL/min — ABNORMAL LOW (ref >60)
Glucose, Bld: 142 mg/dL — ABNORMAL HIGH (ref 70–99)
Potassium: 3.6 mmol/L (ref 3.5–5.1)
Sodium: 139 mmol/L (ref 135–145)

## 2021-12-02 LAB — GLUCOSE, CAPILLARY: Glucose-Capillary: 104 mg/dL — ABNORMAL HIGH (ref 70–99)

## 2021-12-02 MED ORDER — MIDODRINE HCL 10 MG PO TABS: 10.0000 mg | ORAL_TABLET | Freq: Three times a day (TID) | ORAL | 0 refills | Status: DC

## 2021-12-02 MED ORDER — DM-GUAIFENESIN ER 30-600 MG PO TB12: 1.0000 | ORAL_TABLET | Freq: Two times a day (BID) | ORAL | Status: DC | PRN

## 2021-12-02 MED ORDER — FLUTICASONE-SALMETEROL 250-50 MCG/ACT IN AEPB: 1.0000 | INHALATION_SPRAY | Freq: Two times a day (BID) | RESPIRATORY_TRACT | 0 refills | Status: DC

## 2021-12-02 MED ORDER — METOPROLOL SUCCINATE ER 25 MG PO TB24: 12.5000 mg | ORAL_TABLET | Freq: Every day | ORAL | 0 refills | Status: DC

## 2021-12-02 MED ORDER — FUROSEMIDE 20 MG PO TABS: 20.0000 mg | ORAL_TABLET | Freq: Every day | ORAL | 0 refills | Status: DC

## 2021-12-02 NOTE — TOC Transition Note (Signed)
Transition of Care [facility]) - CM/SW Discharge Note   Patient Details  Name: Loany Neuroth MRN: [id number] Date of Birth: 16-Oct-1930  Transition of [facility]) CM/SW Contact:  Alberteen Sam, LCSW Phone Number: 12/02/2021, 1:30 PM   Clinical Narrative:     Patient to discharge back home to [facility] with hospice, CSW has faxed FL2 and DC summary to [phone]. RN given number for report [phone], [facility] has been called. EMS forms on chart.  Final next level of care: Assisted Living Barriers to Discharge: No Barriers Identified   Patient Goals and CMS Choice        Discharge Placement                       Discharge Plan and Services                                     Social Determinants of Health (SDOH) Interventions     Readmission Risk Interventions    04/27/2021    1:16 PM  Readmission Risk Prevention Plan  Post Dischage Appt Complete  Medication Screening Complete  Transportation Screening Complete

## 2021-12-02 NOTE — NC FL2 (Signed)
[place] LEVEL OF CARE SCREENING TOOL     IDENTIFICATION  Patient Name: [name] Birthdate: 06/23/1930 Sex: female Admission Date (Current Location): 11/27/2021  [facility] and [place] Number:  Engineering geologist and Address:  [facility], [address], [place], [place] [address]      Provider Number: [id number]  Attending Physician Name and Address:  Emeterio Reeve, DO  Relative Name and Phone Number:  [name] (daughter) [phone]    Current Level of Care: Hospital Recommended Level of Care: [facility]) Prior Approval Number:    Date Approved/Denied:   PASRR Number:    Discharge Plan: Other (Comment) [facility])    Current Diagnoses: Patient Active Problem List   Diagnosis Date Noted   Acute respiratory failure with hypoxia ([place]) 11/27/2021   Generalized weakness 71/24/5809   Acute metabolic encephalopathy 98/33/8250   Cellulitis of left lower extremity 07/29/2021   Bilateral cellulitis of lower leg 07/27/2021   Depression with anxiety 07/27/2021   Fall 07/27/2021   Chronic kidney disease, stage 3a ([place]) 07/27/2021   COVID-19 virus infection 07/04/2021   Acute hypoxemic respiratory failure due to COVID-19 ([place]) 07/04/2021   Asthma-COPD overlap syndrome 07/04/2021   Frequent falls 04/26/2021   Nausea vomiting and diarrhea 02/25/2021   Atrial fibrillation with RVR ([place]) 02/25/2021   Acute renal failure superimposed on stage 3a chronic kidney disease ([place]) 02/25/2021   Chronic diastolic CHF (congestive heart failure) ([place]) 02/25/2021   Severe sepsis ([place]) 02/25/2021   Lower extremity cellulitis 02/25/2021   Allergic rhinitis 08/10/2020   Peripheral neuropathy 08/10/2020   Acute delirium    Hypomagnesemia    Sepsis due to group B Streptococcus without acute organ dysfunction (HCC)    Acute on chronic diastolic CHF (congestive heart failure) (HCC)    Atrial fibrillation,  chronic ([place])    Depression    Sepsis secondary to UTI ([place]) 07/05/2020   COPD with acute exacerbation (HCC)    Hypothyroidism    Hypertension    CKD (chronic kidney disease) stage 3, GFR 30-59 ml/min (HCC)    Chronic kidney disease 06/21/2020   Secondary hyperaldosteronism ([place]) 06/16/2020   Stasis dermatitis of both legs 07/28/2017   Pruritic rash 07/03/2017   Gait instability 05/27/2017   CHF (congestive heart failure) ([place]) 03/27/2017   Lower extremity ulceration, left, limited to breakdown of skin ([place]) 03/27/2017   High risk medication use 03/12/2017   Primary hyperparathyroidism ([place]) 03/12/2017   Impaired glucose tolerance 03/12/2017   Noncompliance 03/12/2017   Parathyroid adenoma 03/12/2017   Chronic renal insufficiency, stage 3 (moderate) ([place]) 03/12/2017   Mild reactive airways disease 03/08/2016   Influenza 03/06/2016   Morbid obesity with BMI of 45.0-49.9, adult ([place]) 02/18/2016   OSA (obstructive sleep apnea) 02/18/2016   CKD (chronic kidney disease), stage II 02/12/2016   Swelling of limb 02/12/2016   Pain in toes of both feet 01/14/2016   Varicose veins of both lower extremities with pain 01/14/2016   Lymphedema 01/14/2016   Longstanding persistent atrial fibrillation ([place]) 10/04/2015   Atrophic vaginitis 12/13/2014   Hypercalcemia 12/13/2014   Hyperparathyroidism ([place]) 12/13/2014   Chronic UTI (urinary tract infection) 11/29/2014   Hyperlipidemia 01/03/2014   Bilateral edema of lower extremity 12/08/2013   SOB (shortness of breath) 12/08/2013   Anxiety 06/15/2013   GERD (gastroesophageal reflux disease) 06/15/2013   Osteoarthritis 06/15/2013   COPD (chronic obstructive pulmonary disease) ([place]) 06/15/2013   Incomplete emptying of bladder 05/25/2013  Mixed incontinence 34/19/3790   Renal colic 24/10/7351   Renal cyst, acquired 05/25/2013   Sciatica 05/25/2013   Chronic cystitis 05/25/2013    Orientation RESPIRATION BLADDER Height & Weight     Self,  Place  Normal Incontinent, External catheter Weight: 270 lb 8.1 oz (122.7 kg) Height:  '5\' 4"'$  (162.6 cm)  BEHAVIORAL SYMPTOMS/MOOD NEUROLOGICAL BOWEL NUTRITION STATUS      Incontinent Diet (low sodium heart healthy)  AMBULATORY STATUS COMMUNICATION OF NEEDS Skin   Limited Assist Verbally Other (Comment) (abrasion left arm)                       Personal Care Assistance Level of Assistance  Bathing, Feeding, Total care, Dressing Bathing Assistance: Limited assistance Feeding assistance: Limited assistance Dressing Assistance: Limited assistance Total Care Assistance: Maximum assistance   Functional Limitations Info  Sight, Hearing, Speech Sight Info: Impaired Hearing Info: Adequate Speech Info: Adequate    SPECIAL CARE FACTORS FREQUENCY                       Contractures Contractures Info: Not present    Additional Factors Info  Code Status, Allergies Code Status Info: partial Allergies Info: Prednisone   Aspirin   Sulfa Antibiotics   Sulfacetamide Sodium           Discharge Medications:  acetaminophen 500 MG tablet Commonly known as: TYLENOL Take 500-1,000 mg by mouth every 6 (six) hours as needed for mild pain or moderate pain.    albuterol 108 (90 Base) MCG/ACT inhaler Commonly known as: VENTOLIN HFA Inhale 2 puffs into the lungs every 6 (six) hours as needed for wheezing or shortness of breath.    albuterol (2.5 MG/3ML) 0.083% nebulizer solution Commonly known as: PROVENTIL Take 2.5 mg by nebulization in the morning and at bedtime.    apixaban 5 MG Tabs tablet Commonly known as: ELIQUIS Take 5 mg by mouth 2 (two) times daily.    cyanocobalamin 500 MCG tablet Commonly known as: VITAMIN B12 Take 500 mcg by mouth daily.    dextromethorphan-guaiFENesin 30-600 MG 12hr tablet Commonly known as: MUCINEX DM Take 1 tablet by mouth 2 (two) times daily as needed for cough.    DULoxetine 60 MG capsule Commonly known as: CYMBALTA Take 60 mg by mouth  daily.    famotidine 20 MG tablet Commonly known as: PEPCID Take 20 mg by mouth at bedtime.    fluticasone-salmeterol 250-50 MCG/ACT Aepb Commonly known as: Wixela Inhub Inhale 1 puff into the lungs in the morning and at bedtime.    furosemide 20 MG tablet Commonly known as: LASIX Take 1 tablet (20 mg total) by mouth daily. Increase to 1 tablet (20 mg total) by mouth TWICE daily (total daily dose 40 mg) as needed for up to 3 days for increased leg swelling, shortness of breath, weight gain 5+ lbs over 1-2 days. Seek medical care if these symptoms are not improving with increased dose. What changed:  when to take this additional instructions    levothyroxine 150 MCG tablet Commonly known as: SYNTHROID Take 150 mcg by mouth daily.    lidocaine 4 % 1 patch daily.    loratadine 10 MG tablet Commonly known as: CLARITIN Take 10 mg by mouth daily.    metoprolol succinate 25 MG 24 hr tablet Commonly known as: TOPROL-XL Take 0.5 tablets (12.5 mg total) by mouth at bedtime.    midodrine 10 MG tablet Commonly known as: PROAMATINE Take 1  tablet (10 mg total) by mouth 3 (three) times daily with meals.    montelukast 10 MG tablet Commonly known as: SINGULAIR Take 10 mg by mouth at bedtime.    nystatin cream Commonly known as: MYCOSTATIN Apply 1 application. topically 3 (three) times daily. (Apply under the breasts)    omeprazole 20 MG capsule Commonly known as: PRILOSEC Take 20 mg by mouth daily.    PreserVision AREDS 2+Multi Vit Caps Take 1 capsule by mouth daily.    risperiDONE 0.5 MG tablet Commonly known as: RISPERDAL Take 0.5 mg by mouth 2 (two) times daily.    traMADol 50 MG tablet Commonly known as: ULTRAM Take 50 mg by mouth every 6 (six) hours as needed.    triamcinolone cream 0.1 % Commonly known as: KENALOG Apply 1 application. topically 2 (two) times daily.             Relevant Imaging Results:  Relevant Lab Results:   Additional Information SSN:  [id number]  Alberteen Sam, LCSW

## 2021-12-02 NOTE — Telephone Encounter (Signed)
Pharmacy Patient Advocate Encounter  Insurance verification completed.    The patient is insured through [employer] Part D   The patient is currently admitted and ran test claims for the following: [place], [name], [place].  Copays and coinsurance results were relayed to Inpatient clinical team.

## 2021-12-02 NOTE — Discharge Summary (Signed)
Physician Discharge Summary   Patient: [name] MRN: [id number]  DOB: Mar 07, 1930   Admit:     Date of Admission: 11/27/2021 Admitted from: assisted living    Discharge: Date of discharge: 12/02/21 Disposition: Assisted living Condition at discharge: fair  CODE STATUS: [place]     Discharge Physician: Emeterio Reeve, DO [facility]     PCP: [facility]  Recommendations for Outpatient Follow-up:  Follow up with PCP [facility] in 1-2 weeks Please obtain labs/tests: CBC, BMP in 1-2 weeks Please follow up on the following pending results: none PCP AND OTHER OUTPATIENT PROVIDERS: SEE BELOW FOR SPECIFIC DISCHARGE INSTRUCTIONS PRINTED FOR PATIENT IN ADDITION TO GENERIC AVS PATIENT INFO     Discharge Instructions     (HEART FAILURE PATIENTS) Call MD:  Anytime you have any of the following symptoms: 1) 3 pound weight gain in 24 hours or 5 pounds in 1 week 2) shortness of breath, with or without a dry hacking cough 3) swelling in the hands, feet or stomach 4) if you have to sleep on extra pillows at night in order to breathe.   Complete by: As directed    Call MD for:  difficulty breathing, headache or visual disturbances   Complete by: As directed    Call MD for:  persistant dizziness or light-headedness   Complete by: As directed    Diet - low sodium heart healthy   Complete by: As directed    Discharge instructions   Complete by: As directed    Resume home hospice services.  Recommend frequent supervision / assistance.  Advise check vital signs daily and alert provider if abnormal - may need to adjust midodrine. Remains on beta blocker for rate control.   Increase activity slowly   Complete by: As directed          Discharge Diagnoses: Principal Problem:   Acute respiratory failure with hypoxia ([place]) Active Problems:   Acute on chronic diastolic CHF (congestive heart failure) (HCC)   COPD with acute  exacerbation (HCC)   Atrial fibrillation, chronic (HCC)   Chronic kidney disease, stage 3a (HCC)   Hypercalcemia   Hyperparathyroidism ([place])   Hypothyroidism   Hypertension   Acute metabolic encephalopathy   OSA (obstructive sleep apnea)   Morbid obesity with BMI of 45.0-49.9, adult ([place])   Depression with anxiety       Hospital Course: [name] is a 88 y.o. female with medical history significant of COPD not on oxygen, asthma, hypothyroidism, gout, depression, OSA on CPAP, PVD, MRSA, dCHF, atrial fibrillation on Eliquis, CKD-3A, UTI, hyperparathyroidism, hypercalcemia, lymphedema, early stage of dementia, leg cellulitis, who presents via EMS from [place] 11/27/2021 with AMS and SOB. Patient was recently hospitalized from 9/8 - 9/10 due to altered mental status.  Patient had negative work-up including negative CT of head, normal vitamin B1 level and slightly elevated TSH 4.569.  Her altered mental status was thought possibly due to delirium in the setting of dementia. Per her daughter, patient has been confused again in the past several days, with agitation.  At her normal baseline before last admission, patient was oriented x3.  Today patient is confused. Per her daughter, patient has short of breath and dry cough, does not seem to have chest pain.  No active nausea ,vomiting, diarrhea or abdominal pain.  No symptoms of UTI.  Patient has bilateral leg edema and wheezing. 10/04: oxygen desaturation to 80% on room air, with  acute respiratory distress.  BiPAP was started in ED. BNP 129.7, troponin level 13, negative COVID PCR, urinalysis negative except for rare bacteria, stable renal function, temperature normal, blood pressure 112/86, heart rate 99, RR 23.  ABG with pH 7.44, CO2 50, O2 104 on BiPAP.  Chest x-ray showed cardiomegaly and vascular congestion.  Patient was admitted to PCU as inpatient for respiratory failure with hypoxia likely due to HFpEF exacerbation + COPD exacerbation. 10/05:  Net IO documented Since Admission: -1,875 mL [11/28/21 0801]. Remains on BiPap in the morning but off in the afternoon and maintaining on 4L [place].  Hypotensive in afternoon/evening.  Held antihypertensives, started on midodrine.  ICU team aware in case worse. 10/06: Bump in creatinine to 1.4, reduced Lasix down to p.o. Net IO Since Admission: -1,960 mL [11/29/21 0851] but suspect possible error, minimal UOP documented yesterday. Pt reports significant anxiety related to respiratory status. Echo done and pending read  10/07: pt taking BiPap off overnight, saturating okay this mornign on 2L [place]. Cr improved to 1.04. Net IO Since Admission: -4,770 mL [11/30/21 0934]. Echo LVEF 50-55%, global hypokinesis, LVH, indeterminate diastolic fxn,  58/09: not keeping O2 on, confused in AM. Spoke to daughter - I [name]' think pt will be safe to go home, will get PT/OT. Pt is on hospice at home. I'm not convinced she's imminently end-of-life but may consider comfort measures depending on how she does over the next few days. Per PT "Family not interested in PT/SNF rehab, prefer to maintain hospice benefit and pathway at discharge. They feel that patient is unable to participate/benefit from therapy and prefer to 'keep her more comfortable'." 10/09: pt doing well this morning off O2 and stable for discharge. Called daughter, all questions answered.     Consultants:  none  Procedures: Echocardiogram 11/29/2021:  LVEF 50-55%, global hypokinesis, LVH, indeterminate diastolic fxn      ASSESSMENT & PLAN:   Principal Problem:   Acute respiratory failure with hypoxia (HCC) Active Problems:   Acute on chronic diastolic CHF (congestive heart failure) (HCC)   COPD with acute exacerbation (HCC)   Atrial fibrillation, chronic (HCC)   Chronic kidney disease, stage 3a (HCC)   Hypercalcemia   Hyperparathyroidism ([place])   Hypothyroidism   Hypertension   Acute metabolic encephalopathy   OSA (obstructive sleep apnea)    Morbid obesity with BMI of 45.0-49.9, adult ([place])   Depression with anxiety   Acute respiratory failure with hypoxia (HCC) requiring BiPap Due to CHF/COPD see individual A/P below  wean off BiPAP as able, ok to use prn/qhs  Nasal cannula oxygen to maintain oxygen saturation above 93% when patient is off BiPAP  Acute on chronic diastolic CHF (congestive heart failure) ([place]) 2D echo on 07/07/2021 showed EF of 55 to 60%. Repeat this admission shows  LVEF 50-55%, global hypokinesis, LVH, indeterminate diastolic fxn,  Lasix 60 mg bid by IV --> reduced given elevation creatinine --> Cr better today --> po lasix on discharge w/ parameters to increased prn symptoms  Daily weight Low salt diet Fluid restriction  COPD with acute exacerbation (HCC) Bronchodilators As needed Mucinex Incentive spirometry Wixela on discharge   Acute metabolic encephalopathy - resolved Etiology is not clear.  Urinalysis negative.  Potential differential diagnosis include delirium, hypoxia, progression of dementia.   Atrial fibrillation, chronic (HCC) Continue Eliquis Continue metoprolol  AKI on Chronic kidney disease, stage 3a (HCC) - AKI resolved Follow-up BMP  Hypercalcemia Hyperparathyroidism ([place]) Hypercalcemia due to hyperparathyroidism: Calcium slightly elevated 10.5. Follow up  with BMP  Hypothyroidism Synthroid  Hypertension - has been hypotensive here  Lasix for CHF Metoprolol for AFib rate control  Midodrine - may need to adjust outpatient  OSA (obstructive sleep apnea) Patient is on BiPAP now on RA Restart CPAP qhs   Morbid obesity with BMI of 45.0-49.9, adult (HCC) BMI= 48.92  and BW= 131.7kg Diet and exercise.   Encourage to lose weight.  Depression with anxiety Continue home medications  Advance care planning: Discussion with patient/daughter early admission - advised it is not beneficial to decline intubation but request CPR if cardiac arrest. Educated that CPR (even if  successful ROSC) without intubation to maintain hemodynamic and respiratory stability or correct underlying respiratory abnormality, is not typically helpful.  Patient maintains that she would want CPR attempted, but would not want to be intubated under any circumstances.  Advise outpatient doctor/APP and hospice revisit this conversation again.            Discharge Instructions  Allergies as of 12/02/2021       Reactions   Prednisone Other (See Comments)   Irritability   Aspirin Nausea Only   Sulfa Antibiotics Rash   Sulfacetamide Sodium Rash        Medication List     STOP taking these medications    benzonatate 100 MG capsule Commonly known as: TESSALON   hydrOXYzine 10 MG tablet Commonly known as: ATARAX   ipratropium 0.02 % nebulizer solution Commonly known as: ATROVENT       TAKE these medications    acetaminophen 500 MG tablet Commonly known as: TYLENOL Take 500-1,000 mg by mouth every 6 (six) hours as needed for mild pain or moderate pain.   albuterol 108 (90 Base) MCG/ACT inhaler Commonly known as: VENTOLIN HFA Inhale 2 puffs into the lungs every 6 (six) hours as needed for wheezing or shortness of breath.   albuterol (2.5 MG/3ML) 0.083% nebulizer solution Commonly known as: PROVENTIL Take 2.5 mg by nebulization in the morning and at bedtime.   apixaban 5 MG Tabs tablet Commonly known as: ELIQUIS Take 5 mg by mouth 2 (two) times daily.   cyanocobalamin 500 MCG tablet Commonly known as: VITAMIN B12 Take 500 mcg by mouth daily.   dextromethorphan-guaiFENesin 30-600 MG 12hr tablet Commonly known as: MUCINEX DM Take 1 tablet by mouth 2 (two) times daily as needed for cough.   DULoxetine 60 MG capsule Commonly known as: CYMBALTA Take 60 mg by mouth daily.   famotidine 20 MG tablet Commonly known as: PEPCID Take 20 mg by mouth at bedtime.   fluticasone-salmeterol 250-50 MCG/ACT Aepb Commonly known as: Wixela Inhub Inhale 1 puff into the  lungs in the morning and at bedtime.   furosemide 20 MG tablet Commonly known as: LASIX Take 1 tablet (20 mg total) by mouth daily. Increase to 1 tablet (20 mg total) by mouth TWICE daily (total daily dose 40 mg) as needed for up to 3 days for increased leg swelling, shortness of breath, weight gain 5+ lbs over 1-2 days. Seek medical care if these symptoms are not improving with increased dose. What changed:  when to take this additional instructions   levothyroxine 150 MCG tablet Commonly known as: SYNTHROID Take 150 mcg by mouth daily.   lidocaine 4 % 1 patch daily.   loratadine 10 MG tablet Commonly known as: CLARITIN Take 10 mg by mouth daily.   metoprolol succinate 25 MG 24 hr tablet Commonly known as: TOPROL-XL Take 0.5 tablets (12.5 mg total) by mouth  at bedtime.   midodrine 10 MG tablet Commonly known as: PROAMATINE Take 1 tablet (10 mg total) by mouth 3 (three) times daily with meals.   montelukast 10 MG tablet Commonly known as: SINGULAIR Take 10 mg by mouth at bedtime.   nystatin cream Commonly known as: MYCOSTATIN Apply 1 application. topically 3 (three) times daily. (Apply under the breasts)   omeprazole 20 MG capsule Commonly known as: PRILOSEC Take 20 mg by mouth daily.   PreserVision AREDS 2+Multi Vit Caps Take 1 capsule by mouth daily.   risperiDONE 0.5 MG tablet Commonly known as: RISPERDAL Take 0.5 mg by mouth 2 (two) times daily.   traMADol 50 MG tablet Commonly known as: ULTRAM Take 50 mg by mouth every 6 (six) hours as needed.   triamcinolone cream 0.1 % Commonly known as: KENALOG Apply 1 application. topically 2 (two) times daily.          Allergies  Allergen Reactions   Prednisone Other (See Comments)    Irritability   Aspirin Nausea Only   Sulfa Antibiotics Rash   Sulfacetamide Sodium Rash     Subjective: pt has no complaints this morning, denies SOB or CP. Is oriented to person, place, month/year    Discharge  Exam: BP (!) 117/94 (BP Location: Left Arm)   Pulse 98   Temp (!) 96.8 F (36 C)   Resp 16   Ht '5\' 4"'$  (1.626 m)   Wt 122.7 kg   SpO2 94%   BMI 46.43 kg/m  General: Pt is alert, awake, not in acute distress Cardiovascular: irreg irreg, S1/S2 +, no rubs, no gallops Respiratory: CTA bilaterally, no wheezing, no rhonchi Abdominal: Soft, NT, ND, bowel sounds + Extremities: no edema, no cyanosis     The results of significant diagnostics from this hospitalization (including imaging, microbiology, ancillary and laboratory) are listed below for reference.     Microbiology: Recent Results (from the past 240 hour(s))  Resp Panel by RT-PCR (Flu A&B, Covid) Anterior Nasal Swab     Status: None   Collection Time: 11/27/21 10:19 AM   Specimen: Anterior Nasal Swab  Result Value Ref Range Status   SARS Coronavirus 2 by RT PCR NEGATIVE NEGATIVE Final    Comment: (NOTE) SARS-CoV-2 target nucleic acids are NOT DETECTED.  The SARS-CoV-2 RNA is generally detectable in upper respiratory specimens during the acute phase of infection. The lowest concentration of SARS-CoV-2 viral copies this assay can detect is 138 copies/mL. A negative result does not preclude SARS-Cov-2 infection and should not be used as the sole basis for treatment or other patient management decisions. A negative result may occur with  improper specimen collection/handling, submission of specimen other than nasopharyngeal swab, presence of viral mutation(s) within the areas targeted by this assay, and inadequate number of viral copies(<138 copies/mL). A negative result must be combined with clinical observations, patient history, and epidemiological information. The expected result is Negative.  Fact Sheet for Patients:  EntrepreneurPulse.com.au  Fact Sheet for Healthcare Providers:  IncredibleEmployment.be  This test is no t yet approved or cleared by the [place] FDA and  has  been authorized for detection and/or diagnosis of SARS-CoV-2 by FDA under an Emergency Use Authorization (EUA). This EUA will remain  in effect (meaning this test can be used) for the duration of the COVID-19 declaration under Section 564(b)(1) of the Act, 21 U.S.C.section 360bbb-3(b)(1), unless the authorization is terminated  or revoked sooner.       Influenza A by PCR NEGATIVE NEGATIVE  Final   Influenza B by PCR NEGATIVE NEGATIVE Final    Comment: (NOTE) The Xpert Xpress SARS-CoV-2/FLU/RSV plus assay is intended as an aid in the diagnosis of influenza from Nasopharyngeal swab specimens and should not be used as a sole basis for treatment. Nasal washings and aspirates are unacceptable for Xpert Xpress SARS-CoV-2/FLU/RSV testing.  Fact Sheet for Patients: EntrepreneurPulse.com.au  Fact Sheet for Healthcare Providers: IncredibleEmployment.be  This test is not yet approved or cleared by the [place] FDA and has been authorized for detection and/or diagnosis of SARS-CoV-2 by FDA under an Emergency Use Authorization (EUA). This EUA will remain in effect (meaning this test can be used) for the duration of the COVID-19 declaration under Section 564(b)(1) of the Act, 21 U.S.C. section 360bbb-3(b)(1), unless the authorization is terminated or revoked.  Performed at [facility], [address], [place], [place] [address]   Urine Culture     Status: None   Collection Time: 11/27/21 10:25 AM   Specimen: In/Out Cath Urine  Result Value Ref Range Status   Specimen Description   Final    IN/OUT CATH URINE Performed at [facility], [address], [place], [place] [address]    Special Requests   Final    NONE Performed at [facility], [address], [place], [place] [address]    Culture   Final    NO GROWTH Performed at [facility], [place] [address] [place], [place] [address]    Report Status  11/28/2021 FINAL  Final     Labs: BNP (last 3 results) Recent Labs    07/27/21 1340 11/01/21 1237 11/27/21 1017  BNP 135.2* 144.5* 876.8*   Basic Metabolic Panel: Recent Labs  Lab 11/27/21 1017 11/28/21 0557 11/29/21 0443 11/30/21 0619 12/01/21 0410 12/02/21 0903  NA 140 141 139 138 138 139  K 4.4 4.1 3.9 3.9 3.3* 3.6  CL 105 100 100 102 100 101  CO2 32 31 31 32 35* 31  GLUCOSE 126* 134* 104* 117* 104* 142*  BUN 23 26* 40* 36* 24* 24*  CREATININE 1.05* 0.98 1.31* 1.04* 0.91 1.01*  CALCIUM 10.5* 10.5* 10.3 10.2 10.4* 10.8*  MG 2.1  --   --   --   --   --    Liver Function Tests: Recent Labs  Lab 11/27/21 1017  AST 17  ALT 10  ALKPHOS 81  BILITOT 0.6  PROT 7.1  ALBUMIN 3.5   No results for input(s): "LIPASE", "AMYLASE" in the last 168 hours. Recent Labs  Lab 11/27/21 1917  AMMONIA 10   CBC: Recent Labs  Lab 11/27/21 1017 11/28/21 0557 11/29/21 0443 11/30/21 0619 12/02/21 0903  WBC 11.9* 10.2 12.1* 9.9 10.0  HGB 11.5* 12.2 10.6* 10.0* 12.1  HCT 37.2 39.9 34.6* 32.6* 39.0  MCV 86.7 84.5 86.9 84.7 84.4  PLT 234 240 218 203 251   Cardiac Enzymes: No results for input(s): "CKTOTAL", "CKMB", "CKMBINDEX", "TROPONINI" in the last 168 hours. BNP: Invalid input(s): "POCBNP" CBG: Recent Labs  Lab 11/28/21 0822 11/29/21 0801 11/30/21 0815 12/01/21 0842 12/02/21 0756  GLUCAP 113* 82 107* 103* 104*   D-Dimer No results for input(s): "DDIMER" in the last 72 hours. Hgb A1c No results for input(s): "HGBA1C" in the last 72 hours. Lipid Profile No results for input(s): "CHOL", "HDL", "LDLCALC", "TRIG", "CHOLHDL", "LDLDIRECT" in the last 72 hours. Thyroid function studies No results for input(s): "TSH", "T4TOTAL", "T3FREE", "THYROIDAB" in the last 72 hours.  Invalid input(s): "FREET3"  Anemia work up No results for input(s): "VITAMINB12", "FOLATE", "FERRITIN", "TIBC", "IRON", "RETICCTPCT" in the last 72 hours. Urinalysis    Component Value Date/Time    COLORURINE YELLOW (A) 11/27/2021 1025   APPEARANCEUR CLEAR (A) 11/27/2021 1025   APPEARANCEUR Clear 01/19/2013 1248   LABSPEC 1.018 11/27/2021 1025   LABSPEC 1.004 01/19/2013 1248   PHURINE 5.0 11/27/2021 1025   GLUCOSEU NEGATIVE 11/27/2021 1025   GLUCOSEU Negative 01/19/2013 1248   HGBUR NEGATIVE 11/27/2021 1025   BILIRUBINUR NEGATIVE 11/27/2021 1025   BILIRUBINUR Negative 01/19/2013 [place] 11/27/2021 1025   PROTEINUR NEGATIVE 11/27/2021 1025   NITRITE NEGATIVE 11/27/2021 1025   LEUKOCYTESUR NEGATIVE 11/27/2021 1025   LEUKOCYTESUR Negative 01/19/2013 1248   Sepsis Labs Recent Labs  Lab 11/28/21 0557 11/29/21 0443 11/30/21 0619 12/02/21 0903  WBC 10.2 12.1* 9.9 10.0   Microbiology Recent Results (from the past 240 hour(s))  Resp Panel by RT-PCR (Flu A&B, Covid) Anterior Nasal Swab     Status: None   Collection Time: 11/27/21 10:19 AM   Specimen: Anterior Nasal Swab  Result Value Ref Range Status   SARS Coronavirus 2 by RT PCR NEGATIVE NEGATIVE Final    Comment: (NOTE) SARS-CoV-2 target nucleic acids are NOT DETECTED.  The SARS-CoV-2 RNA is generally detectable in upper respiratory specimens during the acute phase of infection. The lowest concentration of SARS-CoV-2 viral copies this assay can detect is 138 copies/mL. A negative result does not preclude SARS-Cov-2 infection and should not be used as the sole basis for treatment or other patient management decisions. A negative result may occur with  improper specimen collection/handling, submission of specimen other than nasopharyngeal swab, presence of viral mutation(s) within the areas targeted by this assay, and inadequate number of viral copies(<138 copies/mL). A negative result must be combined with clinical observations, patient history, and epidemiological information. The expected result is Negative.  Fact Sheet for Patients:  EntrepreneurPulse.com.au  Fact Sheet for  Healthcare Providers:  IncredibleEmployment.be  This test is no t yet approved or cleared by the [place] FDA and  has been authorized for detection and/or diagnosis of SARS-CoV-2 by FDA under an Emergency Use Authorization (EUA). This EUA will remain  in effect (meaning this test can be used) for the duration of the COVID-19 declaration under Section 564(b)(1) of the Act, 21 U.S.C.section 360bbb-3(b)(1), unless the authorization is terminated  or revoked sooner.       Influenza A by PCR NEGATIVE NEGATIVE Final   Influenza B by PCR NEGATIVE NEGATIVE Final    Comment: (NOTE) The Xpert Xpress SARS-CoV-2/FLU/RSV plus assay is intended as an aid in the diagnosis of influenza from Nasopharyngeal swab specimens and should not be used as a sole basis for treatment. Nasal washings and aspirates are unacceptable for Xpert Xpress SARS-CoV-2/FLU/RSV testing.  Fact Sheet for Patients: EntrepreneurPulse.com.au  Fact Sheet for Healthcare Providers: IncredibleEmployment.be  This test is not yet approved or cleared by the [place] FDA and has been authorized for detection and/or diagnosis of SARS-CoV-2 by FDA under an Emergency Use Authorization (EUA). This EUA will remain in effect (meaning this test can be used) for the duration of the COVID-19 declaration under Section 564(b)(1) of the Act, 21 U.S.C. section 360bbb-3(b)(1), unless the authorization is terminated or revoked.  Performed at [facility], [address], [place], [place] [address]   Urine Culture     Status: None   Collection Time: 11/27/21 10:25 AM   Specimen: In/Out Cath Urine  Result  Value Ref Range Status   Specimen Description   Final    IN/OUT CATH URINE Performed at [facility], [address] [place], [place] [address]    Special Requests   Final    NONE Performed at [facility], [address], [place], [place]  [address]    Culture   Final    NO GROWTH Performed at [facility], [place] [address] [place], [place] [address]    Report Status 11/28/2021 FINAL  Final   Imaging ECHOCARDIOGRAM COMPLETE  Result Date: 11/29/2021    ECHOCARDIOGRAM REPORT   Patient Name:   DEEDEE LYBARGER Date of Exam: 11/29/2021 Medical Rec #:  [id number]      Height:       64.0 in Accession #:    [id number]     Weight:       285.5 lb Date of Birth:  30-Dec-1930      BSA:          2.276 m Patient Age:    37 years       BP:           Not listed in chart/Not listed in                                              chart mmHg Patient Gender: F              HR:           99 bpm. Exam Location:  [facility] Procedure: 2D Echo, Cardiac Doppler and Color Doppler Indications:     CHF-acute diastolic W10.93  History:         Patient has prior history of Echocardiogram examinations, most                  recent 07/07/2021. CHF, COPD; Risk Factors:Hypertension and                  Sleep Apnea.  Sonographer:     Sherrie Sport Referring Phys:  [id number] Emeterio Reeve Diagnosing Phys: Neoma Laming  Sonographer Comments: Suboptimal parasternal window and suboptimal apical window. IMPRESSIONS  1. Left ventricular ejection fraction, by estimation, is 50 to 55%. The left ventricle has low normal function. The left ventricle demonstrates global hypokinesis. There is mild concentric left ventricular hypertrophy. Left ventricular diastolic parameters are indeterminate.  2. Right ventricular systolic function is low normal. The right ventricular size is normal. Mildly increased right ventricular wall thickness.  3. Left atrial size was mildly dilated.  4. Right atrial size was mildly dilated.  5. The mitral valve is grossly normal. Trivial mitral valve regurgitation.  6. The aortic valve is calcified. Aortic valve regurgitation is not visualized. Aortic valve sclerosis/calcification is present, without any evidence of aortic stenosis. FINDINGS  Left Ventricle: Left  ventricular ejection fraction, by estimation, is 50 to 55%. The left ventricle has low normal function. The left ventricle demonstrates global hypokinesis. The left ventricular internal cavity size was normal in size. There is mild concentric left ventricular hypertrophy. Left ventricular diastolic parameters are indeterminate. Right Ventricle: The right ventricular size is normal. Mildly increased right ventricular wall thickness. Right ventricular systolic function is low normal. Left Atrium: Left atrial size was mildly dilated. Right Atrium: Right atrial size was mildly dilated. Pericardium: There is no evidence of pericardial effusion. Mitral Valve:  The mitral valve is grossly normal. Trivial mitral valve regurgitation. Tricuspid Valve: The tricuspid valve is grossly normal. Tricuspid valve regurgitation is trivial. Aortic Valve: The aortic valve is calcified. Aortic valve regurgitation is not visualized. Aortic valve sclerosis/calcification is present, without any evidence of aortic stenosis. Aortic valve mean gradient measures 4.0 mmHg. Aortic valve peak gradient measures 6.2 mmHg. Aortic valve area, by VTI measures 2.66 cm. Pulmonic Valve: The pulmonic valve was not well visualized. Pulmonic valve regurgitation is not visualized. Aorta: The aortic root, ascending aorta and aortic arch are all structurally normal, with no evidence of dilitation or obstruction. IAS/Shunts: No atrial level shunt detected by color flow Doppler.  LEFT VENTRICLE PLAX 2D LVIDd:         4.00 cm LVIDs:         2.90 cm LV PW:         1.50 cm LV IVS:        1.00 cm LVOT diam:     2.00 cm LV SV:         55 LV SV Index:   24 LVOT Area:     3.14 cm  RIGHT VENTRICLE RV S prime:     9.90 cm/s TAPSE (M-mode): 2.1 cm LEFT ATRIUM              Index        RIGHT ATRIUM           Index LA diam:        4.20 cm  1.85 cm/m   RA Area:     16.20 cm LA Vol (A2C):   76.7 ml  33.70 ml/m  RA Volume:   41.40 ml  18.19 ml/m LA Vol (A4C):   103.0 ml  45.26 ml/m LA Biplane Vol: 92.4 ml  40.60 ml/m  AORTIC VALVE AV Area (Vmax):    2.20 cm AV Area (Vmean):   2.32 cm AV Area (VTI):     2.66 cm AV Vmax:           124.00 cm/s AV Vmean:          88.600 cm/s AV VTI:            0.208 m AV Peak Grad:      6.2 mmHg AV Mean Grad:      4.0 mmHg LVOT Vmax:         87.00 cm/s LVOT Vmean:        65.500 cm/s LVOT VTI:          0.176 m LVOT/AV VTI ratio: 0.85  AORTA Ao Root diam: 2.50 cm MITRAL VALVE                TRICUSPID VALVE MV Area (PHT): 3.53 cm     TR Peak grad:   25.0 mmHg MV Decel Time: 215 msec     TR Vmax:        250.00 cm/s MV E velocity: 101.00 cm/s                             SHUNTS                             Systemic VTI:  0.18 m                             Systemic Diam: 2.00  cm Neoma Laming Electronically signed by Neoma Laming Signature Date/Time: 11/29/2021/10:09:37 AM    Final       Time coordinating discharge: over 30 minutes  SIGNED:  Emeterio Reeve DO [facility]

## 2021-12-02 NOTE — Care Management Important Message (Signed)
Important Message  Patient Details  Name: Kashara Blocher MRN: [id number] Date of Birth: 03-31-1930   Medicare Important Message Given:  Other (see comment)  Disposition to discharge with hospice services.  Medicare IM withheld at this time.    Dannette Barbara 12/02/2021, 1:49 PM

## 2021-12-02 NOTE — TOC Benefit Eligibility Note (Signed)
Patient [name], English as a foreign language completed.    The patient is currently admitted and upon discharge could be taking Wixela Inhaler 250/50 mcg.  The current 30 day co-pay is $33.83.   The patient is currently admitted and upon discharge could be taking Anoro Ellipta Inhaler.  Non Formulary   The patient is currently admitted and upon discharge could be taking Dulera 126mg Inhaler.  Non Formulary   The patient is [place] through [place] [facility] Adv[place] Team Direct Number: [phone] Fax: [phone]

## 2021-12-02 NOTE — TOC Progression Note (Signed)
Transition of Care [facility]) - Progression Note    Patient Details  Name: [name] MRN: [id number] Date of Birth: [birth year]  Transition of Care [facility]) CM/SW [place],  Phone Number: 12/02/2021, 11:32 AM  Clinical Narrative:     CSW spoke with [facility] they report patient can return today, they request DC summary be faxed to [phone] and new fl2 be sent as well. They are aware patient will be returning and resuming hospice she was active with prior to admission. MD aware.        Expected Discharge Plan and Services                                                 Social Determinants of Health (SDOH) Interventions    Readmission Risk Interventions    04/27/2021    1:16 PM  Readmission Risk Prevention Plan  Post Dischage Appt Complete  Medication Screening Complete  Transportation Screening Complete

## 2021-12-03 ENCOUNTER — Other Ambulatory Visit: Payer: Self-pay

## 2021-12-03 ENCOUNTER — Encounter: Payer: Self-pay | Admitting: Emergency Medicine

## 2021-12-03 ENCOUNTER — Ambulatory Visit (INDEPENDENT_AMBULATORY_CARE_PROVIDER_SITE_OTHER): Payer: Medicare HMO | Admitting: Nurse Practitioner

## 2021-12-03 ENCOUNTER — Emergency Department

## 2021-12-03 ENCOUNTER — Observation Stay
Admission: EM | Admit: 2021-12-03 | Discharge: 2021-12-04 | Disposition: A | Discharge status: Hospice | Attending: Hospitalist | Admitting: Hospitalist

## 2021-12-03 ENCOUNTER — Encounter (INDEPENDENT_AMBULATORY_CARE_PROVIDER_SITE_OTHER): Payer: Medicare HMO

## 2021-12-03 DIAGNOSIS — J9621 Acute and chronic respiratory failure with hypoxia: Secondary | ICD-10-CM | POA: Diagnosis not present

## 2021-12-03 DIAGNOSIS — F0390 Unspecified dementia without behavioral disturbance: Secondary | ICD-10-CM | POA: Diagnosis not present

## 2021-12-03 DIAGNOSIS — W19XXXA Unspecified fall, initial encounter: Secondary | ICD-10-CM

## 2021-12-03 DIAGNOSIS — Z515 Encounter for palliative care: Secondary | ICD-10-CM | POA: Diagnosis not present

## 2021-12-03 DIAGNOSIS — R0600 Dyspnea, unspecified: Secondary | ICD-10-CM | POA: Diagnosis not present

## 2021-12-03 DIAGNOSIS — Z79899 Other long term (current) drug therapy: Secondary | ICD-10-CM | POA: Diagnosis not present

## 2021-12-03 DIAGNOSIS — J45909 Unspecified asthma, uncomplicated: Secondary | ICD-10-CM | POA: Insufficient documentation

## 2021-12-03 DIAGNOSIS — I130 Hypertensive heart and chronic kidney disease with heart failure and stage 1 through stage 4 chronic kidney disease, or unspecified chronic kidney disease: Secondary | ICD-10-CM | POA: Insufficient documentation

## 2021-12-03 DIAGNOSIS — Y92198 Other place in other specified residential institution as the place of occurrence of the external cause: Secondary | ICD-10-CM | POA: Insufficient documentation

## 2021-12-03 DIAGNOSIS — J9601 Acute respiratory failure with hypoxia: Secondary | ICD-10-CM | POA: Diagnosis not present

## 2021-12-03 DIAGNOSIS — N1831 Chronic kidney disease, stage 3a: Secondary | ICD-10-CM | POA: Insufficient documentation

## 2021-12-03 DIAGNOSIS — J441 Chronic obstructive pulmonary disease with (acute) exacerbation: Secondary | ICD-10-CM

## 2021-12-03 DIAGNOSIS — I4891 Unspecified atrial fibrillation: Secondary | ICD-10-CM | POA: Insufficient documentation

## 2021-12-03 DIAGNOSIS — R4182 Altered mental status, unspecified: Secondary | ICD-10-CM | POA: Diagnosis not present

## 2021-12-03 DIAGNOSIS — Z7901 Long term (current) use of anticoagulants: Secondary | ICD-10-CM | POA: Diagnosis not present

## 2021-12-03 DIAGNOSIS — Z1152 Encounter for screening for COVID-19: Secondary | ICD-10-CM | POA: Insufficient documentation

## 2021-12-03 DIAGNOSIS — Z7722 Contact with and (suspected) exposure to environmental tobacco smoke (acute) (chronic): Secondary | ICD-10-CM | POA: Diagnosis not present

## 2021-12-03 DIAGNOSIS — I509 Heart failure, unspecified: Secondary | ICD-10-CM | POA: Diagnosis not present

## 2021-12-03 DIAGNOSIS — E039 Hypothyroidism, unspecified: Secondary | ICD-10-CM | POA: Insufficient documentation

## 2021-12-03 DIAGNOSIS — J811 Chronic pulmonary edema: Secondary | ICD-10-CM | POA: Diagnosis not present

## 2021-12-03 LAB — COMPREHENSIVE METABOLIC PANEL
ALT: 14 U/L (ref 0–44)
AST: 21 U/L (ref 15–41)
Albumin: 3.5 g/dL (ref 3.5–5.0)
Alkaline Phosphatase: 74 U/L (ref 38–126)
Anion gap: 8 (ref 5–15)
BUN: 27 mg/dL — ABNORMAL HIGH (ref 8–23)
CO2: 32 mmol/L (ref 22–32)
Calcium: 10.4 mg/dL — ABNORMAL HIGH (ref 8.9–10.3)
Chloride: 101 mmol/L (ref 98–111)
Creatinine, Ser: 1.17 mg/dL — ABNORMAL HIGH (ref 0.44–1.00)
GFR, Estimated: 45 mL/min — ABNORMAL LOW (ref >60)
Glucose, Bld: 108 mg/dL — ABNORMAL HIGH (ref 70–99)
Potassium: 3.6 mmol/L (ref 3.5–5.1)
Sodium: 141 mmol/L (ref 135–145)
Total Bilirubin: 0.8 mg/dL (ref 0.3–1.2)
Total Protein: 7.1 g/dL (ref 6.5–8.1)

## 2021-12-03 LAB — BLOOD GAS, ARTERIAL
Acid-Base Excess: 8.8 mmol/L — ABNORMAL HIGH (ref 0.0–2.0)
Bicarbonate: 33.5 mmol/L — ABNORMAL HIGH (ref 20.0–28.0)
O2 Content: 3 L/min
O2 Saturation: 98 %
Patient temperature: 37
pCO2 arterial: 45 mmHg (ref 32–48)
pH, Arterial: 7.48 — ABNORMAL HIGH (ref 7.35–7.45)
pO2, Arterial: 91 mmHg (ref 83–108)

## 2021-12-03 LAB — CBC WITH DIFFERENTIAL/PLATELET
Abs Immature Granulocytes: 0.05 10*3/uL (ref 0.00–0.07)
Basophils Absolute: 0.1 10*3/uL (ref 0.0–0.1)
Basophils Relative: 0 %
Eosinophils Absolute: 0.6 10*3/uL — ABNORMAL HIGH (ref 0.0–0.5)
Eosinophils Relative: 5 %
HCT: 39.3 % (ref 36.0–46.0)
Hemoglobin: 12 g/dL (ref 12.0–15.0)
Immature Granulocytes: 0 %
Lymphocytes Relative: 18 %
Lymphs Abs: 2.1 10*3/uL (ref 0.7–4.0)
MCH: 26.3 pg (ref 26.0–34.0)
MCHC: 30.5 g/dL (ref 30.0–36.0)
MCV: 86.2 fL (ref 80.0–100.0)
Monocytes Absolute: 1 10*3/uL (ref 0.1–1.0)
Monocytes Relative: 8 %
Neutro Abs: 8.4 10*3/uL — ABNORMAL HIGH (ref 1.7–7.7)
Neutrophils Relative %: 69 %
Platelets: 262 10*3/uL (ref 150–400)
RBC: 4.56 MIL/uL (ref 3.87–5.11)
RDW: 15.4 % (ref 11.5–15.5)
WBC: 12.2 10*3/uL — ABNORMAL HIGH (ref 4.0–10.5)
nRBC: 0 % (ref 0.0–0.2)

## 2021-12-03 LAB — RESP PANEL BY RT-PCR (FLU A&B, COVID) ARPGX2
Influenza A by PCR: NEGATIVE
Influenza B by PCR: NEGATIVE
SARS Coronavirus 2 by RT PCR: NEGATIVE

## 2021-12-03 LAB — SAMPLE TO BLOOD BANK

## 2021-12-03 LAB — TROPONIN I (HIGH SENSITIVITY): Troponin I (High Sensitivity): 14 ng/L (ref <18)

## 2021-12-03 MED ORDER — LORAZEPAM 2 MG/ML IJ SOLN
1.0000 mg | INTRAMUSCULAR | Status: DC | PRN
  Administered 2021-12-03 (×2): 1 mg via INTRAVENOUS
  Filled 2021-12-03 (×2): qty 1

## 2021-12-03 MED ORDER — POLYVINYL ALCOHOL 1.4 % OP SOLN: 1.0000 [drp] | Freq: Four times a day (QID) | OPHTHALMIC | Status: DC | PRN

## 2021-12-03 MED ORDER — LORAZEPAM 2 MG/ML PO CONC: 1.0000 mg | ORAL | Status: DC | PRN

## 2021-12-03 MED ORDER — FUROSEMIDE 10 MG/ML IJ SOLN
20.0000 mg | Freq: Once | INTRAMUSCULAR | Status: AC
  Administered 2021-12-03: 20 mg via INTRAVENOUS
  Filled 2021-12-03: qty 4

## 2021-12-03 MED ORDER — METHYLPREDNISOLONE SODIUM SUCC 125 MG IJ SOLR
125.0000 mg | Freq: Once | INTRAMUSCULAR | Status: AC
  Administered 2021-12-03: 125 mg via INTRAVENOUS
  Filled 2021-12-03: qty 2

## 2021-12-03 MED ORDER — ACETAMINOPHEN 650 MG RE SUPP: 650.0000 mg | Freq: Four times a day (QID) | RECTAL | Status: DC | PRN

## 2021-12-03 MED ORDER — HALOPERIDOL LACTATE 5 MG/ML IJ SOLN
5.0000 mg | Freq: Four times a day (QID) | INTRAMUSCULAR | Status: DC | PRN
  Administered 2021-12-03: 5 mg via INTRAVENOUS
  Filled 2021-12-03: qty 1

## 2021-12-03 MED ORDER — LORAZEPAM 1 MG PO TABS: 1.0000 mg | ORAL_TABLET | ORAL | Status: DC | PRN

## 2021-12-03 MED ORDER — GLYCOPYRROLATE 0.2 MG/ML IJ SOLN
0.2000 mg | INTRAMUSCULAR | Status: DC | PRN
  Filled 2021-12-03: qty 1

## 2021-12-03 MED ORDER — IPRATROPIUM-ALBUTEROL 0.5-2.5 (3) MG/3ML IN SOLN: 3.0000 mL | Freq: Four times a day (QID) | RESPIRATORY_TRACT | Status: DC | PRN

## 2021-12-03 MED ORDER — ONDANSETRON 4 MG PO TBDP: 4.0000 mg | ORAL_TABLET | Freq: Four times a day (QID) | ORAL | Status: DC | PRN

## 2021-12-03 MED ORDER — GLYCOPYRROLATE 1 MG PO TABS: 1.0000 mg | ORAL_TABLET | ORAL | Status: DC | PRN

## 2021-12-03 MED ORDER — ACETAMINOPHEN 325 MG PO TABS: 650.0000 mg | ORAL_TABLET | Freq: Four times a day (QID) | ORAL | Status: DC | PRN

## 2021-12-03 MED ORDER — ONDANSETRON HCL 4 MG/2ML IJ SOLN: 4.0000 mg | Freq: Four times a day (QID) | INTRAMUSCULAR | Status: DC | PRN

## 2021-12-03 MED ORDER — IPRATROPIUM-ALBUTEROL 0.5-2.5 (3) MG/3ML IN SOLN
3.0000 mL | Freq: Once | RESPIRATORY_TRACT | Status: AC
  Administered 2021-12-03: 3 mL via RESPIRATORY_TRACT
  Filled 2021-12-03: qty 3

## 2021-12-03 MED ORDER — GLYCOPYRROLATE 0.2 MG/ML IJ SOLN
0.2000 mg | INTRAMUSCULAR | Status: DC | PRN
  Administered 2021-12-04: 0.2 mg via INTRAVENOUS
  Filled 2021-12-03: qty 1

## 2021-12-03 MED ORDER — BIOTENE DRY MOUTH MT LIQD: 15.0000 mL | OROMUCOSAL | Status: DC | PRN

## 2021-12-03 NOTE — H&P (Signed)
History and Physical    Patient: [name] HGD:[id number] DOB: 05-06-1930 DOA: 12/03/2021 DOS: the patient was seen and examined on 12/03/2021 PCP: [facility]  Patient coming from: SNF  Chief Complaint:  Chief Complaint  Patient presents with   Fall   HPI: [name] is a 88 y.o. female with medical history significant of COPD, asthma, hypothyroidism, gout, depression, OSA on CPAP, PVD, CHF, atrial fibrillation on Eliquis, CKD 3A, hypercalcemia who presents to the ED after being found down at her SNF.  Patient was previously hospitalized from 9/8-9/10 for altered mental status with negative work-up.  At that time, mental status changes were thought to be secondary to delirium in the setting of dementia.  Patient was once again hospitalized from 10/4 - 10/9 for acute hypoxic respiratory failure in the setting of CHF exacerbation and COPD exacerbation.  She required BiPAP for several days but was able to be weaned to room air and discharged to SNF on 10/9.  On the morning of 10/10, patient was found slumped over the side of her bed by staff.  At that time, her vitals demonstrated O2 saturation in the 70s.  She was altered at that time.  EMS was contacted.  On arrival to the ED, patient was normotensive at 110/72 with heart rate of 75.  She was afebrile at 98.  She was saturating at 98% on 2 L.  ABG demonstrated PO2 of 91 and PCO2 45.  Initial lab work demonstrated elevated white blood count of 12.2 compared to 10.01-day prior.  Her creatinine was increased to 1.17 compared to 1.01 the day prior.  Chest x-ray demonstrated only mild congestion.  CT head without any acute findings.  Patient significantly altered with poor respiratory drive-ED provider discussed with family who do not want to pursue any further invasive intervention.  [facility] contacted for admission.  Review of Systems: unable to review all systems due to the inability of the patient to answer questions.  Past  Medical History:  Diagnosis Date   Acute metabolic encephalopathy 10/31/2227   Arrhythmia    atrial fibrillation   Arthritis    Asthma    CHF (congestive heart failure) (HCC)    COPD (chronic obstructive pulmonary disease) (HCC)    Cough    Dementia (HCC)    Elevated CK 11/01/2021   GERD (gastroesophageal reflux disease)    Gout    Hiatal hernia    Hypertension    Hypothyroidism    MRSA (methicillin resistant staph aureus) culture positive    Neuropathic pain of right lower extremity    Peripheral vascular disease ([place])    "poor circulation"   Seasonal allergies    Shortness of breath dyspnea    Sleep apnea    uses CPAP (sometimes)   Past Surgical History:  Procedure Laterality Date   ANKLE ARTHROSCOPY     BACK SURGERY     L4-L5 Decompression   CARDIAC CATHETERIZATION  1995   CATARACT EXTRACTION W/PHACO Right 07/19/2014   Procedure: CATARACT EXTRACTION PHACO AND INTRAOCULAR LENS PLACEMENT ([place]);  Surgeon: Leandrew Koyanagi, MD;  Location: [place];  Service: Ophthalmology;  Laterality: Right;   CATARACT EXTRACTION W/PHACO Left 08/23/2014   Procedure: CATARACT EXTRACTION PHACO AND INTRAOCULAR LENS PLACEMENT ([facility]);  Surgeon: Leandrew Koyanagi, MD;  Location: [place];  Service: Ophthalmology;  Laterality: Left;   CHOLECYSTECTOMY     COLONOSCOPY     PARATHYROIDECTOMY  2003   TONSILLECTOMY     UVULOPALATOPHARYNGOPLASTY  Social History:  reports that she has never smoked. She has been exposed to tobacco smoke. She has never used smokeless tobacco. She reports that she does not drink alcohol and does not use drugs.  Allergies  Allergen Reactions   Prednisone Other (See Comments)    Irritability   Aspirin Nausea Only   Sulfa Antibiotics Rash   Sulfacetamide Sodium Rash    Family History  Problem Relation Age of Onset   Cancer Mother    Hyperlipidemia Son    Diabetes Son    Cancer Maternal Grandmother     Prior to Admission medications    Medication Sig Start Date End Date Taking? Authorizing Provider  acetaminophen (TYLENOL) 500 MG tablet Take 500-1,000 mg by mouth every 6 (six) hours as needed for mild pain or moderate pain.    [provider]  albuterol (PROVENTIL) (2.5 MG/3ML) 0.083% nebulizer solution Take 2.5 mg by nebulization in the morning and at bedtime.    [provider]  albuterol (VENTOLIN HFA) 108 (90 Base) MCG/ACT inhaler Inhale 2 puffs into the lungs every 6 (six) hours as needed for wheezing or shortness of breath.    [provider]  apixaban (ELIQUIS) 5 MG TABS tablet Take 5 mg by mouth 2 (two) times daily.    [provider]  dextromethorphan-guaiFENesin (MUCINEX DM) 30-600 MG 12hr tablet Take 1 tablet by mouth 2 (two) times daily as needed for cough. 12/02/21   Emeterio Reeve, DO  DULoxetine (CYMBALTA) 60 MG capsule Take 60 mg by mouth daily. 05/04/20   [provider]  famotidine (PEPCID) 20 MG tablet Take 20 mg by mouth at bedtime.    [provider]  fluticasone-salmeterol (WIXELA INHUB) 250-50 MCG/ACT AEPB Inhale 1 puff into the lungs in the morning and at bedtime. 12/02/21   Emeterio Reeve, DO  furosemide (LASIX) 20 MG tablet Take 1 tablet (20 mg total) by mouth daily. Increase to 1 tablet (20 mg total) by mouth TWICE daily (total daily dose 40 mg) as needed for up to 3 days for increased leg swelling, shortness of breath, weight gain 5+ lbs over 1-2 days. Seek medical care if these symptoms are not improving with increased dose. 12/02/21   Emeterio Reeve, DO  levothyroxine (SYNTHROID) 150 MCG tablet Take 150 mcg by mouth daily. 04/01/21   [provider]  lidocaine 4 % 1 patch daily. 10/17/21   [provider]  loratadine (CLARITIN) 10 MG tablet Take 10 mg by mouth daily.    [provider]  metoprolol succinate (TOPROL-XL) 25 MG 24 hr tablet Take 0.5 tablets (12.5 mg total) by mouth at bedtime. 12/02/21   Emeterio Reeve, DO  midodrine (PROAMATINE) 10 MG tablet Take 1 tablet (10 mg total) by mouth 3 (three) times daily with meals. 12/02/21   Emeterio Reeve, DO  montelukast (SINGULAIR) 10 MG tablet Take 10 mg by mouth at bedtime.    [provider]  Multiple Vitamins-Minerals (PRESERVISION AREDS 2+MULTI VIT) CAPS Take 1 capsule by mouth daily.    [provider]  nystatin cream (MYCOSTATIN) Apply 1 application. topically 3 (three) times daily. (Apply under the breasts)    [provider]  omeprazole (PRILOSEC) 20 MG capsule Take 20 mg by mouth daily. 11/25/21 11/25/22  [provider]  risperiDONE (RISPERDAL) 0.5 MG tablet Take 0.5 mg by mouth 2 (two) times daily.    [provider]  traMADol (ULTRAM) 50 MG tablet Take 50 mg by mouth every 6 (six) hours  as needed.    [provider]  triamcinolone cream (KENALOG) 0.1 % Apply 1 application. topically 2 (two) times daily.    [provider]  vitamin B-12 (CYANOCOBALAMIN) 500 MCG tablet Take 500 mcg by mouth daily.    [provider]    Physical Exam: Vitals:   12/03/21 1200 12/03/21 1230 12/03/21 1300 12/03/21 1330  BP: (!) 109/92 (!) 98/49 118/75 120/71  Pulse: 89 82 78 72  Resp: '14 16 15 15  '$ Temp:      TempSrc:      SpO2: 100% 100% 100% 100%  Weight:       Physical Exam Vitals and nursing note reviewed.  Constitutional:      Appearance: She is morbidly obese.     Comments: Lethargic but will open eyes and smile to voice.  HENT:     Head: Normocephalic and atraumatic.     Mouth/Throat:     Mouth: Mucous membranes are dry.     Pharynx: Oropharynx is clear.  Eyes:     Conjunctiva/sclera: Conjunctivae normal.     Pupils: Pupils are equal, round, and reactive to light.  Cardiovascular:     Rate and Rhythm: Normal rate. Rhythm irregular.     Heart sounds: No murmur heard. Pulmonary:     Effort: No accessory muscle usage.     Breath sounds: Decreased breath sounds  (Throughout) present.     Comments: Shallow breathing Abdominal:     General: Bowel sounds are normal. There is no distension.     Palpations: Abdomen is soft.     Tenderness: There is no abdominal tenderness.  Musculoskeletal:     Comments: Bilateral lower extremities wrapped with Ace bandages  Skin:    General: Skin is warm and dry.  Neurological:     Mental Status: She is lethargic.     Comments: Patient is lethargic but opens her eyes and smiles to voice.  She is unable to follow any commands including answering questions regarding orientation.  She quickly falls back asleep.    Data Reviewed: Lab Results  Component Value Date   WBC 12.2 (H) 12/03/2021   HGB 12.0 12/03/2021   HCT 39.3 12/03/2021   MCV 86.2 12/03/2021   PLT 262 12/03/2021   Lab Results  Component Value Date   NA 141 12/03/2021   K 3.6 12/03/2021   CO2 32 12/03/2021   GLUCOSE 108 (H) 12/03/2021   BUN 27 (H) 12/03/2021   CREATININE 1.17 (H) 12/03/2021   CALCIUM 10.4 (H) 12/03/2021   GFRNONAA 45 (L) 12/03/2021   Chest x-ray with mild pulmonary edema. CT head with no evidence of acute intracranial abnormality.  EKG with evidence of atrial fibrillation with rate of 98.  There are no new results to review at this time.  Assessment and Plan: * End of life care Patient presenting after being found down at her SNF with hypoxia.  Although she has been able to maintain her oxygen saturation on 2 L nasal cannula while in the ED, she has poor respiratory drive and would not be able to tolerate BiPAP given altered mental status.  Both ED provider and I have discussed with multiple family members at bedside, including patient's main contact Jamey Reas.  Given patient's steady decline and limited quality of life over the past year given multiple hospitalizations, they would like to transition to comfort care and are interested in inpatient hospice.  - [facility] contacted and will arrange transfer to inpatient  hospice when  available - Comfort measures only - Tylenol as needed for pain or fever - Robinul as needed for excessive secretions - Duo nebs as needed for shortness of breath - Ativan as needed for anxiety - Zofran as needed for nausea  Acute respiratory failure with hypoxia [facility]) Patient presenting after being found hypoxic at her SNF with oxygen saturation in the 70s in the setting of recent hospitalization for acute hypoxic respiratory failure requiring BiPAP.  Given patient's decision to transfer position to comfort care, will continue supplemental oxygen via nasal cannula at 2 L for comfort only.   Advance Care Planning:   Code Status: DNR. Discussion with family at bedside including [name], patient's daughter, patient's son, and two grandsons. [name] explained that Mrs. Cimini has been suffering for a long time with multiple hospitalizations. Recently they have enrolled in hospice care and given her decline today, they would like to pursue inpatient hospice with comfort measures only.   Consults: Palliative Care  Family Communication: Please see above, advanced care planning.   Severity of Illness: The appropriate patient status for this patient is OBSERVATION. Observation status is judged to be reasonable and necessary in order to provide the required intensity of service to ensure the patient's safety. The patient's presenting symptoms, physical exam findings, and initial radiographic and laboratory data in the context of their medical condition is felt to place them at decreased risk for further clinical deterioration. Furthermore, it is anticipated that the patient will be medically stable for discharge from the hospital within 2 midnights of admission.   Author: Jose Persia, MD 12/03/2021 2:07 PM  For on call review [email or web address].

## 2021-12-03 NOTE — Progress Notes (Signed)
       CROSS COVER NOTE  NAME: [name] MRN: [id number] DOB : 09-Jan-1931    Date of Service   12/03/2021   HPI/Events of Note   Nursing staff reports [name] is pulling at IV lines and removing oxygen causing desaturation.   Interventions   Assessment/Plan: Haldol PRN [place]     This document was prepared using Dragon voice recognition software and may include unintentional dictation errors.  Neomia Glass DNP, MBA, FNP-BC Nurse Practitioner [facility] Pager [phone]

## 2021-12-03 NOTE — Progress Notes (Signed)
Palliative:  Consult received. Upon chart review, comfort measures have been initiated and hospice facility has been requested. Discussed with hospice liaison who confirms they are aware of referral and assisting. No palliative needs identified, goals clear.  Juel Burrow, DNP, AGNP-C Palliative Medicine Team Team Phone # [phone]  Pager # [phone]  NO CHARGE

## 2021-12-03 NOTE — Progress Notes (Signed)
Manufacturing engineer [facility]) Hospital Liaison Note  Received request from MD/Dr. Cheri Fowler for family interest in [facility]-- TOC/Ashley aware. Visited patient at bedside and spoke with daughter/Elizabeth to confirm interest and explain services.  Approval for Hospice Home is determined by [facility] MD. Once [facility] MD has determined Hospice Home eligibility, [facility] will update hospital staff and family.  Please do not hesitate to call with any hospice related questions.    Thank you for the opportunity to participate in this patient's care.  Daphene Calamity, MSW [facility] Liaison  [phone]

## 2021-12-03 NOTE — Assessment & Plan Note (Signed)
Patient presenting after being found hypoxic at her SNF with oxygen saturation in the 70s in the setting of recent hospitalization for acute hypoxic respiratory failure requiring BiPAP.  Given patient's decision to transfer position to comfort care, will continue supplemental oxygen via nasal cannula at 2 L for comfort only.

## 2021-12-03 NOTE — Assessment & Plan Note (Signed)
Patient presenting after being found down at her SNF with hypoxia.  Although she has been able to maintain her oxygen saturation on 2 L nasal cannula while in the ED, she has poor respiratory drive and would not be able to tolerate BiPAP given altered mental status.  Both ED provider and I have discussed with multiple family members at bedside, including patient's main contact Jamey Reas.  Given patient's steady decline and limited quality of life over the past year given multiple hospitalizations, they would like to transition to comfort care and are interested in inpatient hospice.  - [facility] contacted and will arrange transfer to inpatient hospice when available - Comfort measures only - Tylenol as needed for pain or fever - Robinul as needed for excessive secretions - Duo nebs as needed for shortness of breath - Ativan as needed for anxiety - Zofran as needed for nausea

## 2021-12-03 NOTE — ED Notes (Signed)
Patient repositioned in the bed at this time. Family members at bedside. No needs expressed to RN.

## 2021-12-03 NOTE — ED Notes (Signed)
Patient placed in a hospital bed at this time. Family at bedside. No needs expressed at this time.

## 2021-12-03 NOTE — ED Provider Notes (Signed)
[facility] Provider Note   Event Date/Time   First MD Initiated Contact with Patient 12/03/21 [phone]     (approximate) History  Fall  HPI [name] is a 88 y.o. female with a past medical history of severe COPD who was recently discharged yesterday for COPD exacerbation with associated altered mental status.  Patient arrives via EMS from a rehab facility after patient was found down in her room earlier today.  Patient arrives GCS of 10 and therefore further history and review of systems are unable to be obtained from patient.  EMS state the patient was recently seen in the hospital for similar symptoms of hypoxic respiratory failure with a COPD exacerbation.   Physical Exam  Triage Vital Signs: ED Triage Vitals  Enc Vitals Group     BP 12/03/21 0914 123/77     Pulse Rate 12/03/21 0914 92     Resp 12/03/21 0914 16     Temp 12/03/21 0920 98 F (36.7 C)     Temp Source 12/03/21 0920 Oral     SpO2 12/03/21 0906 98 %     Weight 12/03/21 0915 285 lb 9.6 oz (129.5 kg)     Height --      Head Circumference --      Peak Flow --      Pain Score 12/03/21 0915 0     Pain Loc --      Pain Edu? --      Excl. in [place]? --    Most recent vital signs: Vitals:   12/03/21 1409 12/03/21 1430  BP:  (!) 114/48  Pulse:  95  Resp:  11  Temp: 98.6 F (37 C)   SpO2:  98%   General: Eyes closed but arousable to painful stimuli CV:  Good peripheral perfusion.  Resp:  Increased effort.  Inspiratory and expiratory wheezes bilaterally Abd:  No distention.  Other:  Elderly morbidly obese Caucasian female GCS of 10 ED Results / Procedures / Treatments  Labs (all labs ordered are listed, but only abnormal results are displayed) Labs Reviewed  COMPREHENSIVE METABOLIC PANEL - Abnormal; Notable for the following components:      Result Value   Glucose, Bld 108 (*)    BUN 27 (*)    Creatinine, Ser 1.17 (*)    Calcium 10.4 (*)    GFR, Estimated 45 (*)    All other  components within normal limits  CBC WITH DIFFERENTIAL/PLATELET - Abnormal; Notable for the following components:   WBC 12.2 (*)    Neutro Abs 8.4 (*)    Eosinophils Absolute 0.6 (*)    All other components within normal limits  BLOOD GAS, ARTERIAL - Abnormal; Notable for the following components:   pH, Arterial 7.48 (*)    Bicarbonate 33.5 (*)    Acid-Base Excess 8.8 (*)    All other components within normal limits  RESP PANEL BY RT-PCR (FLU A&B, COVID) ARPGX2  SAMPLE TO BLOOD BANK  TROPONIN I (HIGH SENSITIVITY)   EKG ED ECG REPORT I, Naaman Plummer, the attending physician, personally viewed and interpreted this ECG. Date: 12/03/2021 EKG Time: 0926 Rate: 98 Rhythm: Atrial fibrillation QRS Axis: normal Intervals: normal ST/T Wave abnormalities: normal Narrative Interpretation: Atrial fibrillation.  No evidence of acute ischemia RADIOLOGY ED MD interpretation: CT of the head without contrast interpreted by me shows no evidence of acute abnormalities including no intracerebral hemorrhage, obvious masses, or significant edema  One-view portable chest x-ray interpreted by  me shows no evidence of acute abnormalities including no pneumonia, pneumothorax, or widened mediastinum -Agree with radiology assessment Official radiology report(s): CT Head Wo Contrast  Result Date: 12/03/2021 CLINICAL DATA:  Mental status change, unknown cause EXAM: CT HEAD WITHOUT CONTRAST TECHNIQUE: Contiguous axial images were obtained from the base of the skull through the vertex without intravenous contrast. RADIATION DOSE REDUCTION: This exam was performed according to the departmental dose-optimization program which includes automated exposure control, adjustment of the mA and/or kV according to patient size and/or use of iterative reconstruction technique. COMPARISON:  CT head October 4, 23. FINDINGS: Brain: No evidence of acute infarction, hemorrhage, hydrocephalus, extra-axial collection or mass  lesion/mass effect. Vascular: No hyperdense vessel identified. Skull: No acute fracture. Sinuses/Orbits: Clear sinuses.  No acute orbital findings. Other: No mastoid effusions. IMPRESSION: No evidence of acute intracranial abnormality. Electronically Signed   By: Margaretha Sheffield M.D.   On: 12/03/2021 10:57   DG Chest Port 1 View  Result Date: 12/03/2021 CLINICAL DATA:  Dyspnea, fall EXAM: PORTABLE CHEST 1 VIEW COMPARISON:  11/27/2021 chest radiograph. FINDINGS: Stable cardiomediastinal silhouette with mild cardiomegaly. No pneumothorax. No pleural effusion. Mild pulmonary edema. Mild platelike right lung base scarring versus atelectasis. IMPRESSION: Mild congestive heart failure. Mild platelike right lung base scarring versus atelectasis. Electronically Signed   By: Ilona Sorrel M.D.   On: 12/03/2021 09:42   PROCEDURES: Critical Care performed: Yes, see critical care procedure note(s) .1-3 Lead EKG Interpretation  Performed by: Naaman Plummer, MD Authorized by: Naaman Plummer, MD     Interpretation: abnormal     ECG rate:  94   ECG rate assessment: normal     Rhythm: atrial fibrillation     Ectopy: none     Conduction: normal    MEDICATIONS ORDERED IN ED: Medications  acetaminophen (TYLENOL) tablet 650 mg (has no administration in time range)    Or  acetaminophen (TYLENOL) suppository 650 mg (has no administration in time range)  ondansetron (ZOFRAN-ODT) disintegrating tablet 4 mg (has no administration in time range)    Or  ondansetron (ZOFRAN) injection 4 mg (has no administration in time range)  glycopyrrolate (ROBINUL) tablet 1 mg (has no administration in time range)    Or  glycopyrrolate (ROBINUL) injection 0.2 mg (has no administration in time range)    Or  glycopyrrolate (ROBINUL) injection 0.2 mg (has no administration in time range)  antiseptic oral rinse (BIOTENE) solution 15 mL (has no administration in time range)  polyvinyl alcohol (LIQUIFILM TEARS) 1.4 % ophthalmic  solution 1 drop (has no administration in time range)  LORazepam (ATIVAN) tablet 1 mg ( Oral See Alternative 12/03/21 1530)    Or  LORazepam (ATIVAN) 2 MG/ML concentrated solution 1 mg ( Sublingual See Alternative 12/03/21 1530)    Or  LORazepam (ATIVAN) injection 1 mg (1 mg Intravenous Given 12/03/21 1530)  ipratropium-albuterol (DUONEB) 0.5-2.5 (3) MG/3ML nebulizer solution 3 mL (has no administration in time range)  furosemide (LASIX) injection 20 mg (20 mg Intravenous Given 12/03/21 1029)  ipratropium-albuterol (DUONEB) 0.5-2.5 (3) MG/3ML nebulizer solution 3 mL (3 mLs Nebulization Given 12/03/21 1224)  methylPREDNISolone sodium succinate (SOLU-MEDROL) 125 mg/2 mL injection 125 mg (125 mg Intravenous Given 12/03/21 1220)   IMPRESSION / MDM / ASSESSMENT AND PLAN / ED COURSE  I reviewed the triage vital signs and the nursing notes.  Differential diagnosis includes, but is not limited to, COPD exacerbation, CHF exacerbation, CVA The patient is on the cardiac monitor to evaluate for evidence of arrhythmia and/or significant heart rate changes. Patient's presentation is most consistent with acute presentation with potential threat to life or bodily function. Patient is an 88 year old female with the above-stated past medical history presents after being found down at her rehab facility today.  Patient arrives significantly hypoxic however improving on 15 L nonrebreather and eventually weaned down to 2 L nasal cannula.  Patient however is extremely somnolent with GCS of 10 on arrival and last note on admission stating that she wishes not to be intubated.  Patient is now not awake or functional enough to tolerate BiPAP at this time.  I spoke to patient's next of kin who states that patient indeed would not want intubation and in fact would likely choose to be comfort care at this time.  We will abide by patient's wishes to the best of our ability and no further work-up  necessary at this time.  I spoke to Dr. Charleen Kirks on the hospitalist service who graciously agreed to accept this patient while awaiting possible transfer to hospice inpatient care.  Dispo: Admit on comfort care   FINAL CLINICAL IMPRESSION(S) / ED DIAGNOSES   Final diagnoses:  Fall, initial encounter  Acute on chronic hypoxic respiratory failure ([place])  COPD exacerbation ([place])   Rx / DC Orders   ED Discharge Orders     None      Note:  This document was prepared using Dragon voice recognition software and may include unintentional dictation errors.   Naaman Plummer, MD 12/03/21 1534

## 2021-12-03 NOTE — ED Triage Notes (Signed)
Patient to ED via [facility] from [facility] for a fall. Patient was found on the floor with a hematoma on back of head. Patient discharged yesterday from hospital for respiratory distress. Patient does wear CPAP at night. BP 75/51 per EMS.

## 2021-12-03 NOTE — ED Notes (Addendum)
Purewick in place. Patient to CT at this time.

## 2021-12-03 NOTE — ED Notes (Signed)
Patient resting in stretcher at this time. Family members at bedside. Mouth swabs at bedside per family's request.

## 2021-12-04 DIAGNOSIS — Z515 Encounter for palliative care: Secondary | ICD-10-CM | POA: Diagnosis not present

## 2021-12-04 MED ORDER — LORAZEPAM 2 MG/ML PO CONC: 1.0000 mg | ORAL | 0 refills | Status: AC | PRN

## 2021-12-04 MED ORDER — POLYVINYL ALCOHOL 1.4 % OP SOLN: 1.0000 [drp] | Freq: Four times a day (QID) | OPHTHALMIC | 0 refills | Status: AC | PRN

## 2021-12-04 NOTE — TOC Transition Note (Signed)
Transition of Care [facility]) - CM/SW Discharge Note   Patient Details  Name: [name] MRN: [id number] Date of Birth: December 05, 1930  [facility] CM/SW Contact:  Coralee Pesa, [place] Phone Number: 12/04/2021, 12:33 PM   Clinical Narrative:    Pt to be transported to [facility] via [place]. Nurse to call report to [phone]   Final next level of care: [place] Barriers to Discharge: Barriers Resolved   Patient Goals and CMS Choice        Discharge Placement              Patient chooses bed at:  (Hospice home) Patient to be transferred to facility by: [facility] Name of family member notified: [name] Patient and family notified of of transfer: 12/04/21  Discharge Plan and Services                                     Social Determinants of Health (SDOH) Interventions     Readmission Risk Interventions    04/27/2021    1:16 PM  Readmission Risk Prevention Plan  Post Dischage Appt Complete  Medication Screening Complete  Transportation Screening Complete

## 2021-12-04 NOTE — Discharge Summary (Signed)
Physician Discharge Summary   [name]  female DOB: [birth year]  ZOX:[id number]  PCP: [facility]  Admit date: 12/03/2021 Discharge date: 12/04/2021  Admitted From: SNF Disposition:  hospice facility CODE STATUS: DNR   Hospital Course:  For full details, please see H&P, progress notes, consult notes and ancillary notes.  Briefly,  [name] is a 88 y.o. female with medical history significant of COPD, asthma, hypothyroidism, gout, depression, OSA on CPAP, PVD, CHF, atrial fibrillation on Eliquis, CKD 3A, hypercalcemia who presented to the ED after being found down at her SNF.   Patient was previously hospitalized from 9/8-9/10 for altered mental status with negative work-up.  At that time, mental status changes were thought to be secondary to delirium in the setting of dementia.  Patient was once again hospitalized from 10/4 - 10/9 for acute hypoxic respiratory failure in the setting of CHF exacerbation and COPD exacerbation.  She required BiPAP for several days but was able to be weaned to room air and discharged to SNF on 10/9.  On the morning of 10/10, patient was found slumped over the side of her bed by staff.  At that time, her vitals demonstrated O2 saturation in the 70s.  She was altered at that time.  EMS was contacted.  * End of life care Patient presenting after being found down at her SNF with hypoxia.  Although she has been able to maintain her oxygen saturation on 2 L nasal cannula while in the ED, she has poor respiratory drive and would not be able to tolerate BiPAP given altered mental status. Given patient's steady decline and limited quality of life over the past year given multiple hospitalizations, family decided to transition to comfort care and pt was discharged to hospice facility.   Acute respiratory failure with hypoxia [facility]) Patient presenting after being found hypoxic at her SNF with oxygen saturation in the 70s in the setting of  recent hospitalization for acute hypoxic respiratory failure requiring BiPAP.  Given patient's decision to transfer position to comfort care, will continue supplemental oxygen via nasal cannula at 2 L for comfort only.   Discharge Diagnoses:  Principal Problem:   End of life care Active Problems:   Acute respiratory failure with hypoxia ([place])   30 Day Unplanned Readmission Risk Score    Flowsheet Row ED to Hosp-Admission (Discharged) from 11/27/2021 in [place] [facility]  30 Day Unplanned Readmission Risk Score (%) 31.56 Filed at 12/02/2021 1600       This score is the patient's risk of an unplanned readmission within 30 days of being discharged (0 -100%). The score is based on dignosis, age, lab data, medications, orders, and past utilization.   Low:  0-14.9   Medium: 15-21.9   High: 22-29.9   Extreme: 30 and above         Discharge Instructions:  Allergies as of 12/04/2021       Reactions   Prednisone Other (See Comments)   Irritability   Aspirin Nausea Only   Sulfa Antibiotics Rash   Sulfacetamide Sodium Rash        Medication List     STOP taking these medications    apixaban 5 MG Tabs tablet Commonly known as: ELIQUIS   cyanocobalamin 500 MCG tablet Commonly known as: VITAMIN B12   dextromethorphan-guaiFENesin 30-600 MG 12hr tablet Commonly known as: MUCINEX DM   famotidine 20 MG tablet Commonly known as: PEPCID   fluticasone-salmeterol 250-50 MCG/ACT Aepb Commonly known  as: Wixela Inhub   furosemide 20 MG tablet Commonly known as: LASIX   levothyroxine 150 MCG tablet Commonly known as: SYNTHROID   lidocaine 4 %   loratadine 10 MG tablet Commonly known as: CLARITIN   metoprolol succinate 25 MG 24 hr tablet Commonly known as: TOPROL-XL   midodrine 10 MG tablet Commonly known as: PROAMATINE   montelukast 10 MG tablet Commonly known as: SINGULAIR   omeprazole 20 MG capsule Commonly known as: PRILOSEC   PreserVision  AREDS 2+Multi Vit Caps       TAKE these medications    albuterol 108 (90 Base) MCG/ACT inhaler Commonly known as: VENTOLIN HFA Inhale 2 puffs into the lungs every 6 (six) hours as needed for wheezing or shortness of breath. What changed: Another medication with the same name was removed. Continue taking this medication, and follow the directions you see here.   DULoxetine 60 MG capsule Commonly known as: CYMBALTA Take 60 mg by mouth daily.   LORazepam 2 MG/ML concentrated solution Commonly known as: ATIVAN Place 0.5 mLs (1 mg total) under the tongue every 4 (four) hours as needed for anxiety.   nystatin cream Commonly known as: MYCOSTATIN Apply 1 application. topically 3 (three) times daily. (Apply under the breasts)   polyvinyl alcohol 1.4 % ophthalmic solution Commonly known as: LIQUIFILM TEARS Place 1 drop into both eyes 4 (four) times daily as needed for dry eyes.   risperiDONE 0.5 MG tablet Commonly known as: RISPERDAL Take 0.5 mg by mouth 2 (two) times daily.   traMADol 50 MG tablet Commonly known as: ULTRAM Take 50 mg by mouth every 6 (six) hours as needed for moderate pain.   triamcinolone cream 0.1 % Commonly known as: KENALOG Apply 1 application. topically 2 (two) times daily.          Allergies  Allergen Reactions   Prednisone Other (See Comments)    Irritability   Aspirin Nausea Only   Sulfa Antibiotics Rash   Sulfacetamide Sodium Rash     The results of significant diagnostics from this hospitalization (including imaging, microbiology, ancillary and laboratory) are listed below for reference.   Consultations:   Procedures/Studies: CT Head Wo Contrast  Result Date: 12/03/2021 CLINICAL DATA:  Mental status change, unknown cause EXAM: CT HEAD WITHOUT CONTRAST TECHNIQUE: Contiguous axial images were obtained from the base of the skull through the vertex without intravenous contrast. RADIATION DOSE REDUCTION: This exam was performed according  to the departmental dose-optimization program which includes automated exposure control, adjustment of the mA and/or kV according to patient size and/or use of iterative reconstruction technique. COMPARISON:  CT head October 4, 23. FINDINGS: Brain: No evidence of acute infarction, hemorrhage, hydrocephalus, extra-axial collection or mass lesion/mass effect. Vascular: No hyperdense vessel identified. Skull: No acute fracture. Sinuses/Orbits: Clear sinuses.  No acute orbital findings. Other: No mastoid effusions. IMPRESSION: No evidence of acute intracranial abnormality. Electronically Signed   By: Margaretha Sheffield M.D.   On: 12/03/2021 10:57   DG Chest Port 1 View  Result Date: 12/03/2021 CLINICAL DATA:  Dyspnea, fall EXAM: PORTABLE CHEST 1 VIEW COMPARISON:  11/27/2021 chest radiograph. FINDINGS: Stable cardiomediastinal silhouette with mild cardiomegaly. No pneumothorax. No pleural effusion. Mild pulmonary edema. Mild platelike right lung base scarring versus atelectasis. IMPRESSION: Mild congestive heart failure. Mild platelike right lung base scarring versus atelectasis. Electronically Signed   By: Ilona Sorrel M.D.   On: 12/03/2021 09:42   ECHOCARDIOGRAM COMPLETE  Result Date: 11/29/2021    ECHOCARDIOGRAM REPORT  Patient Name:   MAKITA BLOW Date of Exam: 11/29/2021 Medical Rec #:  [id number]      Height:       64.0 in Accession #:    [id number]     Weight:       285.5 lb Date of Birth:  [birth year]      BSA:          2.276 m Patient Age:    53 years       BP:           Not listed in chart/Not listed in                                              chart mmHg Patient Gender: F              HR:           99 bpm. Exam Location:  [facility] Procedure: 2D Echo, Cardiac Doppler and Color Doppler Indications:     CHF-acute diastolic J85.63  History:         Patient has prior history of Echocardiogram examinations, most                  recent 07/07/2021. CHF, COPD; Risk Factors:Hypertension and                  Sleep  Apnea.  Sonographer:     Sherrie Sport Referring Phys:  [id number] Emeterio Reeve Diagnosing Phys: Neoma Laming  Sonographer Comments: Suboptimal parasternal window and suboptimal apical window. IMPRESSIONS  1. Left ventricular ejection fraction, by estimation, is 50 to 55%. The left ventricle has low normal function. The left ventricle demonstrates global hypokinesis. There is mild concentric left ventricular hypertrophy. Left ventricular diastolic parameters are indeterminate.  2. Right ventricular systolic function is low normal. The right ventricular size is normal. Mildly increased right ventricular wall thickness.  3. Left atrial size was mildly dilated.  4. Right atrial size was mildly dilated.  5. The mitral valve is grossly normal. Trivial mitral valve regurgitation.  6. The aortic valve is calcified. Aortic valve regurgitation is not visualized. Aortic valve sclerosis/calcification is present, without any evidence of aortic stenosis. FINDINGS  Left Ventricle: Left ventricular ejection fraction, by estimation, is 50 to 55%. The left ventricle has low normal function. The left ventricle demonstrates global hypokinesis. The left ventricular internal cavity size was normal in size. There is mild concentric left ventricular hypertrophy. Left ventricular diastolic parameters are indeterminate. Right Ventricle: The right ventricular size is normal. Mildly increased right ventricular wall thickness. Right ventricular systolic function is low normal. Left Atrium: Left atrial size was mildly dilated. Right Atrium: Right atrial size was mildly dilated. Pericardium: There is no evidence of pericardial effusion. Mitral Valve: The mitral valve is grossly normal. Trivial mitral valve regurgitation. Tricuspid Valve: The tricuspid valve is grossly normal. Tricuspid valve regurgitation is trivial. Aortic Valve: The aortic valve is calcified. Aortic valve regurgitation is not visualized. Aortic valve sclerosis/calcification is  present, without any evidence of aortic stenosis. Aortic valve mean gradient measures 4.0 mmHg. Aortic valve peak gradient measures 6.2 mmHg. Aortic valve area, by VTI measures 2.66 cm. Pulmonic Valve: The pulmonic valve was not well visualized. Pulmonic valve regurgitation is not visualized. Aorta: The aortic root, ascending aorta and aortic arch are all structurally normal, with no evidence of dilitation or  obstruction. IAS/Shunts: No atrial level shunt detected by color flow Doppler.  LEFT VENTRICLE PLAX 2D LVIDd:         4.00 cm LVIDs:         2.90 cm LV PW:         1.50 cm LV IVS:        1.00 cm LVOT diam:     2.00 cm LV SV:         55 LV SV Index:   24 LVOT Area:     3.14 cm  RIGHT VENTRICLE RV S prime:     9.90 cm/s TAPSE (M-mode): 2.1 cm LEFT ATRIUM              Index        RIGHT ATRIUM           Index LA diam:        4.20 cm  1.85 cm/m   RA Area:     16.20 cm LA Vol (A2C):   76.7 ml  33.70 ml/m  RA Volume:   41.40 ml  18.19 ml/m LA Vol (A4C):   103.0 ml 45.26 ml/m LA Biplane Vol: 92.4 ml  40.60 ml/m  AORTIC VALVE AV Area (Vmax):    2.20 cm AV Area (Vmean):   2.32 cm AV Area (VTI):     2.66 cm AV Vmax:           124.00 cm/s AV Vmean:          88.600 cm/s AV VTI:            0.208 m AV Peak Grad:      6.2 mmHg AV Mean Grad:      4.0 mmHg LVOT Vmax:         87.00 cm/s LVOT Vmean:        65.500 cm/s LVOT VTI:          0.176 m LVOT/AV VTI ratio: 0.85  AORTA Ao Root diam: 2.50 cm MITRAL VALVE                TRICUSPID VALVE MV Area (PHT): 3.53 cm     TR Peak grad:   25.0 mmHg MV Decel Time: 215 msec     TR Vmax:        250.00 cm/s MV E velocity: 101.00 cm/s                             SHUNTS                             Systemic VTI:  0.18 m                             Systemic Diam: 2.00 cm Neoma Laming Electronically signed by Neoma Laming Signature Date/Time: 11/29/2021/10:09:37 AM    Final    CT HEAD WO CONTRAST (5MM)  Result Date: 11/27/2021 CLINICAL DATA:  Mental status change, unknown cause.  EXAM: CT HEAD WITHOUT CONTRAST TECHNIQUE: Contiguous axial images were obtained from the base of the skull through the vertex without intravenous contrast. RADIATION DOSE REDUCTION: This exam was performed according to the departmental dose-optimization program which includes automated exposure control, adjustment of the mA and/or kV according to patient size and/or use of iterative reconstruction technique. COMPARISON:  Head CT 11/01/2021 FINDINGS: Brain: There is no  evidence of an acute infarct, intracranial hemorrhage, mass, midline shift, or extra-axial fluid collection. Mild cerebral atrophy is within normal limits for age. No age advanced white matter disease is evident. Vascular: Calcified atherosclerosis at the skull base. No hyperdense vessel. Skull: No fracture or suspicious osseous lesion. Sinuses/Orbits: Chronic right sphenoid sinusitis. Clear mastoid air cells. Bilateral cataract extraction. Other: None. IMPRESSION: No evidence of acute intracranial abnormality. Electronically Signed   By: Logan Bores M.D.   On: 11/27/2021 14:17   DG Chest Portable 1 View  Result Date: 11/27/2021 CLINICAL DATA:  Shortness of breath EXAM: PORTABLE CHEST 1 VIEW COMPARISON:  11/01/2021 FINDINGS: Bilateral interstitial thickening. No focal consolidation. No pleural effusion or pneumothorax. Stable cardiomegaly. No acute osseous abnormality. IMPRESSION: Cardiomegaly with pulmonary vascular congestion. Electronically Signed   By: Kathreen Devoid M.D.   On: 11/27/2021 10:35      Labs: BNP (last 3 results) Recent Labs    07/27/21 1340 11/01/21 1237 11/27/21 1017  BNP 135.2* 144.5* 841.3*   Basic Metabolic Panel: Recent Labs  Lab 11/27/21 1017 11/28/21 0557 11/29/21 0443 11/30/21 0619 12/01/21 0410 12/02/21 0903 12/03/21 0915  NA 140   < > 139 138 138 139 141  K 4.4   < > 3.9 3.9 3.3* 3.6 3.6  CL 105   < > 100 102 100 101 101  CO2 32   < > 31 32 35* 31 32  GLUCOSE 126*   < > 104* 117* 104* 142* 108*   BUN 23   < > 40* 36* 24* 24* 27*  CREATININE 1.05*   < > 1.31* 1.04* 0.91 1.01* 1.17*  CALCIUM 10.5*   < > 10.3 10.2 10.4* 10.8* 10.4*  MG 2.1  --   --   --   --   --   --    < > = values in this interval not displayed.   Liver Function Tests: Recent Labs  Lab 11/27/21 1017 12/03/21 0915  AST 17 21  ALT 10 14  ALKPHOS 81 74  BILITOT 0.6 0.8  PROT 7.1 7.1  ALBUMIN 3.5 3.5   No results for input(s): "LIPASE", "AMYLASE" in the last 168 hours. Recent Labs  Lab 11/27/21 1917  AMMONIA 10   CBC: Recent Labs  Lab 11/28/21 0557 11/29/21 0443 11/30/21 0619 12/02/21 0903 12/03/21 0915  WBC 10.2 12.1* 9.9 10.0 12.2*  NEUTROABS  --   --   --   --  8.4*  HGB 12.2 10.6* 10.0* 12.1 12.0  HCT 39.9 34.6* 32.6* 39.0 39.3  MCV 84.5 86.9 84.7 84.4 86.2  PLT 240 218 203 251 262   Cardiac Enzymes: No results for input(s): "CKTOTAL", "CKMB", "CKMBINDEX", "TROPONINI" in the last 168 hours. BNP: Invalid input(s): "POCBNP" CBG: Recent Labs  Lab 11/28/21 0822 11/29/21 0801 11/30/21 0815 12/01/21 0842 12/02/21 0756  GLUCAP 113* 82 107* 103* 104*   D-Dimer No results for input(s): "DDIMER" in the last 72 hours. Hgb A1c No results for input(s): "HGBA1C" in the last 72 hours. Lipid Profile No results for input(s): "CHOL", "HDL", "LDLCALC", "TRIG", "CHOLHDL", "LDLDIRECT" in the last 72 hours. Thyroid function studies No results for input(s): "TSH", "T4TOTAL", "T3FREE", "THYROIDAB" in the last 72 hours.  Invalid input(s): "FREET3" Anemia work up No results for input(s): "VITAMINB12", "FOLATE", "FERRITIN", "TIBC", "IRON", "RETICCTPCT" in the last 72 hours. Urinalysis    Component Value Date/Time   COLORURINE YELLOW (A) 11/27/2021 1025   APPEARANCEUR CLEAR (A) 11/27/2021 1025   APPEARANCEUR Clear 01/19/2013 1248  LABSPEC 1.018 11/27/2021 1025   LABSPEC 1.004 01/19/2013 1248   PHURINE 5.0 11/27/2021 1025   GLUCOSEU NEGATIVE 11/27/2021 1025   GLUCOSEU Negative 01/19/2013 1248    HGBUR NEGATIVE 11/27/2021 1025   BILIRUBINUR NEGATIVE 11/27/2021 1025   BILIRUBINUR Negative 01/19/2013 [place] 11/27/2021 1025   PROTEINUR NEGATIVE 11/27/2021 1025   NITRITE NEGATIVE 11/27/2021 1025   LEUKOCYTESUR NEGATIVE 11/27/2021 1025   LEUKOCYTESUR Negative 01/19/2013 1248   Sepsis Labs Recent Labs  Lab 11/29/21 0443 11/30/21 0619 12/02/21 0903 12/03/21 0915  WBC 12.1* 9.9 10.0 12.2*   Microbiology Recent Results (from the past 240 hour(s))  Resp Panel by RT-PCR (Flu A&B, Covid) Anterior Nasal Swab     Status: None   Collection Time: 11/27/21 10:19 AM   Specimen: Anterior Nasal Swab  Result Value Ref Range Status   SARS Coronavirus 2 by RT PCR NEGATIVE NEGATIVE Final    Comment: (NOTE) SARS-CoV-2 target nucleic acids are NOT DETECTED.  The SARS-CoV-2 RNA is generally detectable in upper respiratory specimens during the acute phase of infection. The lowest concentration of SARS-CoV-2 viral copies this assay can detect is 138 copies/mL. A negative result does not preclude SARS-Cov-2 infection and should not be used as the sole basis for treatment or other patient management decisions. A negative result may occur with  improper specimen collection/handling, submission of specimen other than nasopharyngeal swab, presence of viral mutation(s) within the areas targeted by this assay, and inadequate number of viral copies(<138 copies/mL). A negative result must be combined with clinical observations, patient history, and epidemiological information. The expected result is Negative.  Fact Sheet for Patients:  EntrepreneurPulse.com.au  Fact Sheet for Healthcare Providers:  IncredibleEmployment.be  This test is no t yet approved or cleared by the [place] FDA and  has been authorized for detection and/or diagnosis of SARS-CoV-2 by FDA under an Emergency Use Authorization (EUA). This EUA will remain  in effect  (meaning this test can be used) for the duration of the COVID-19 declaration under Section 564(b)(1) of the Act, 21 U.S.C.section 360bbb-3(b)(1), unless the authorization is terminated  or revoked sooner.       Influenza A by PCR NEGATIVE NEGATIVE Final   Influenza B by PCR NEGATIVE NEGATIVE Final    Comment: (NOTE) The Xpert Xpress SARS-CoV-2/FLU/RSV plus assay is intended as an aid in the diagnosis of influenza from Nasopharyngeal swab specimens and should not be used as a sole basis for treatment. Nasal washings and aspirates are unacceptable for Xpert Xpress SARS-CoV-2/FLU/RSV testing.  Fact Sheet for Patients: EntrepreneurPulse.com.au  Fact Sheet for Healthcare Providers: IncredibleEmployment.be  This test is not yet approved or cleared by the [place] FDA and has been authorized for detection and/or diagnosis of SARS-CoV-2 by FDA under an Emergency Use Authorization (EUA). This EUA will remain in effect (meaning this test can be used) for the duration of the COVID-19 declaration under Section 564(b)(1) of the Act, 21 U.S.C. section 360bbb-3(b)(1), unless the authorization is terminated or revoked.  Performed at [facility], [address], [place], [place] [address]   Urine Culture     Status: None   Collection Time: 11/27/21 10:25 AM   Specimen: In/Out Cath Urine  Result Value Ref Range Status   Specimen Description   Final    IN/OUT CATH URINE Performed at [facility], [address] [place], [place] [address]    Special Requests   Final    NONE Performed at [facility], [phone]  [address], [place], [place] [address]    Culture   Final    NO GROWTH Performed at [facility], [place] [address], [place], [place] [address]    Report Status 11/28/2021 FINAL  Final  Resp Panel by RT-PCR (Flu A&B, Covid) Anterior Nasal Swab     Status: None   Collection Time: 12/03/21 12:54 PM   Specimen:  Anterior Nasal Swab  Result Value Ref Range Status   SARS Coronavirus 2 by RT PCR NEGATIVE NEGATIVE Final    Comment: (NOTE) SARS-CoV-2 target nucleic acids are NOT DETECTED.  The SARS-CoV-2 RNA is generally detectable in upper respiratory specimens during the acute phase of infection. The lowest concentration of SARS-CoV-2 viral copies this assay can detect is 138 copies/mL. A negative result does not preclude SARS-Cov-2 infection and should not be used as the sole basis for treatment or other patient management decisions. A negative result may occur with  improper specimen collection/handling, submission of specimen other than nasopharyngeal swab, presence of viral mutation(s) within the areas targeted by this assay, and inadequate number of viral copies(<138 copies/mL). A negative result must be combined with clinical observations, patient history, and epidemiological information. The expected result is Negative.  Fact Sheet for Patients:  EntrepreneurPulse.com.au  Fact Sheet for Healthcare Providers:  IncredibleEmployment.be  This test is no t yet approved or cleared by the [place] FDA and  has been authorized for detection and/or diagnosis of SARS-CoV-2 by FDA under an Emergency Use Authorization (EUA). This EUA will remain  in effect (meaning this test can be used) for the duration of the COVID-19 declaration under Section 564(b)(1) of the Act, 21 U.S.C.section 360bbb-3(b)(1), unless the authorization is terminated  or revoked sooner.       Influenza A by PCR NEGATIVE NEGATIVE Final   Influenza B by PCR NEGATIVE NEGATIVE Final    Comment: (NOTE) The Xpert Xpress SARS-CoV-2/FLU/RSV plus assay is intended as an aid in the diagnosis of influenza from Nasopharyngeal swab specimens and should not be used as a sole basis for treatment. Nasal washings and aspirates are unacceptable for Xpert Xpress SARS-CoV-2/FLU/RSV testing.  Fact  Sheet for Patients: EntrepreneurPulse.com.au  Fact Sheet for Healthcare Providers: IncredibleEmployment.be  This test is not yet approved or cleared by the [place] FDA and has been authorized for detection and/or diagnosis of SARS-CoV-2 by FDA under an Emergency Use Authorization (EUA). This EUA will remain in effect (meaning this test can be used) for the duration of the COVID-19 declaration under Section 564(b)(1) of the Act, 21 U.S.C. section 360bbb-3(b)(1), unless the authorization is terminated or revoked.  Performed at [facility], [address] [place], [place] [address]      Total time spend on discharging this patient, including the last patient exam, discussing the hospital stay, instructions for ongoing care as it relates to all pertinent caregivers, as well as preparing the medical discharge records, prescriptions, and/or referrals as applicable, is 30 minutes.    Enzo Bi, MD  [facility] 12/04/2021, 10:03 AM

## 2021-12-04 NOTE — ED Notes (Signed)
Pt brought to ED rm 52 at this time. This RN now assuming care.

## 2021-12-04 NOTE — Progress Notes (Signed)
[facility])   Consent forms have been completed.  EMS notified of patient D/C and transport arranged. TOC/Jessica and Attending Physician also notified of transport arrangement.    Please send signed DNR form with patient and RN call report to [phone].    Daphene Calamity, MSW [facility] Liaison [phone]

## 2021-12-04 NOTE — Progress Notes (Signed)
Patient appears comfortable at this time; no air hunger or posturing noted, no gargling or secretions noted at this time.

## 2021-12-05 ENCOUNTER — Ambulatory Visit: Payer: Medicare HMO | Admitting: Family

## 2021-12-11 LAB — BLOOD GAS, ARTERIAL
Acid-Base Excess: 8.3 mmol/L — ABNORMAL HIGH (ref 0.0–2.0)
Bicarbonate: 34 mmol/L — ABNORMAL HIGH (ref 20.0–28.0)
Delivery systems: POSITIVE
FIO2: 35 %
O2 Saturation: 98.8 %
Patient temperature: 37
pCO2 arterial: 50 mmHg — ABNORMAL HIGH (ref 32–48)
pH, Arterial: 7.44 (ref 7.35–7.45)
pO2, Arterial: 104 mmHg (ref 83–108)

## 2021-12-23 ENCOUNTER — Encounter (INDEPENDENT_AMBULATORY_CARE_PROVIDER_SITE_OTHER): Payer: Self-pay

## 2021-12-25 DEATH — deceased

## 2022-01-10 ENCOUNTER — Other Ambulatory Visit: Payer: Self-pay | Admitting: Gastroenterology

## 2022-01-10 DIAGNOSIS — R633 Feeding difficulties, unspecified: Secondary | ICD-10-CM

## 2022-01-10 DIAGNOSIS — R1312 Dysphagia, oropharyngeal phase: Secondary | ICD-10-CM

## 2022-01-10 DIAGNOSIS — R1314 Dysphagia, pharyngoesophageal phase: Secondary | ICD-10-CM

## 2022-02-13 IMAGING — DX DG CHEST 1V PORT
2 series · 2 of 2 positions shown · non-contrast
Comparison: 10/03/2015

CLINICAL DATA: Cough

EXAM:
PORTABLE CHEST 1 VIEW

[chest ap (1 of 2)]
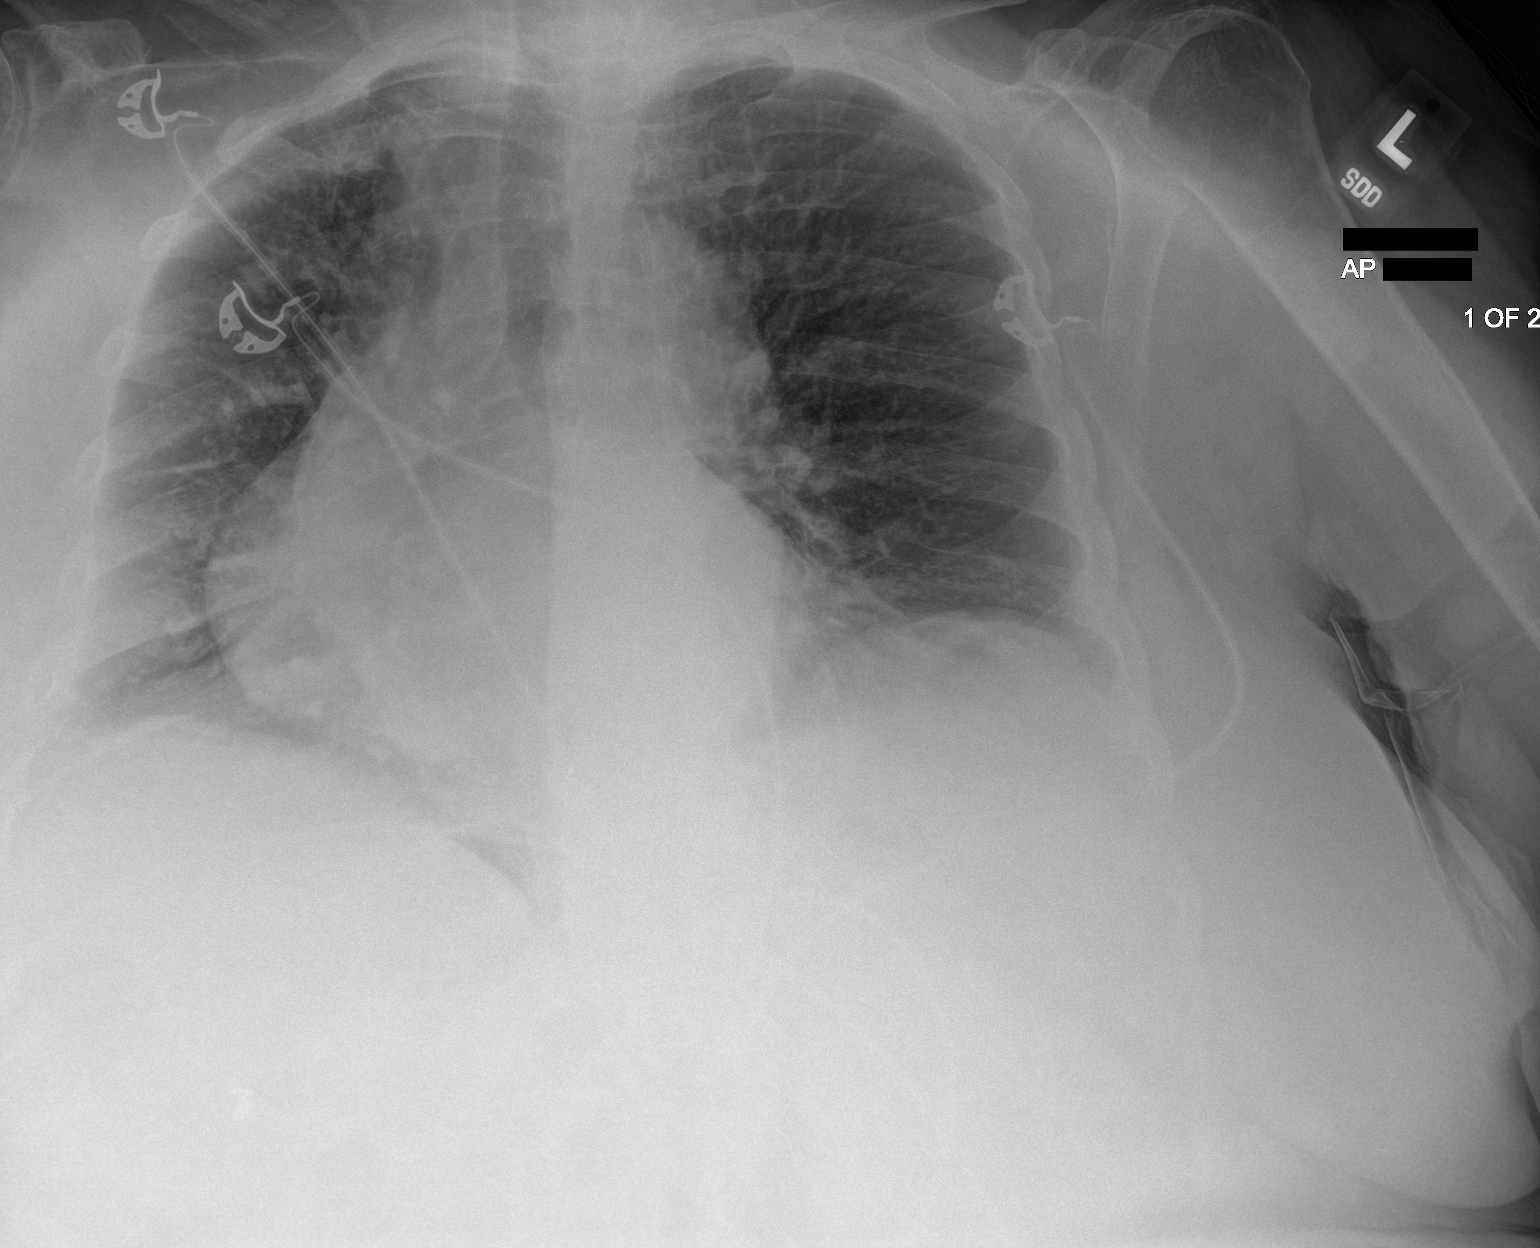

[chest ap (2 of 2)]
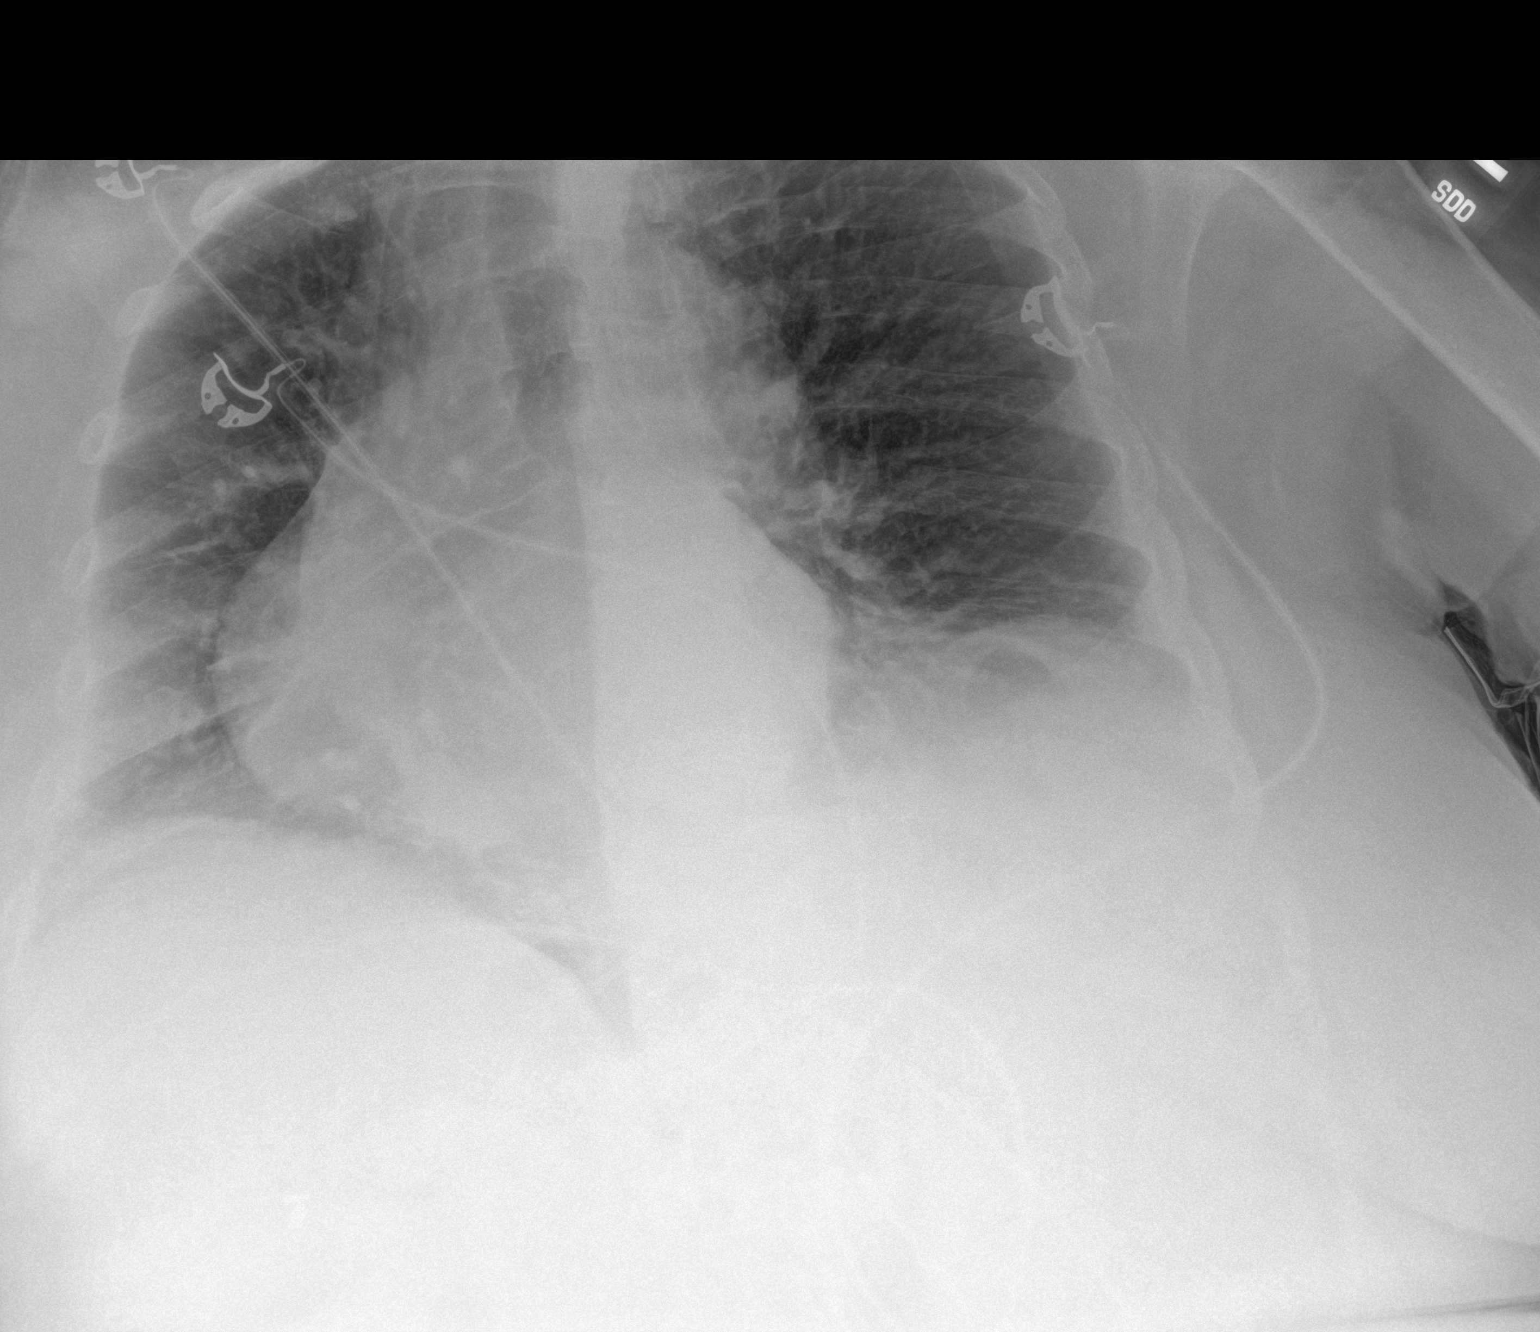

[2 of 2 positions shown; findings below may reference images not displayed]

FINDINGS: Limited low volume and rotated chest. Stable heart size and
mediastinal contours. Interstitial prominence which is similar to
before. There is no edema, consolidation, effusion, or pneumothorax.
IMPRESSION: Stable compared to 3928.  No acute or focal finding.

## 2022-12-09 IMAGING — DX DG CHEST 1V PORT
1 series · 1 of 1 positions shown · non-contrast
Comparison: 04/26/2021

CLINICAL DATA: Wheezing.

EXAM:
PORTABLE CHEST 1 VIEW

[chest ap]
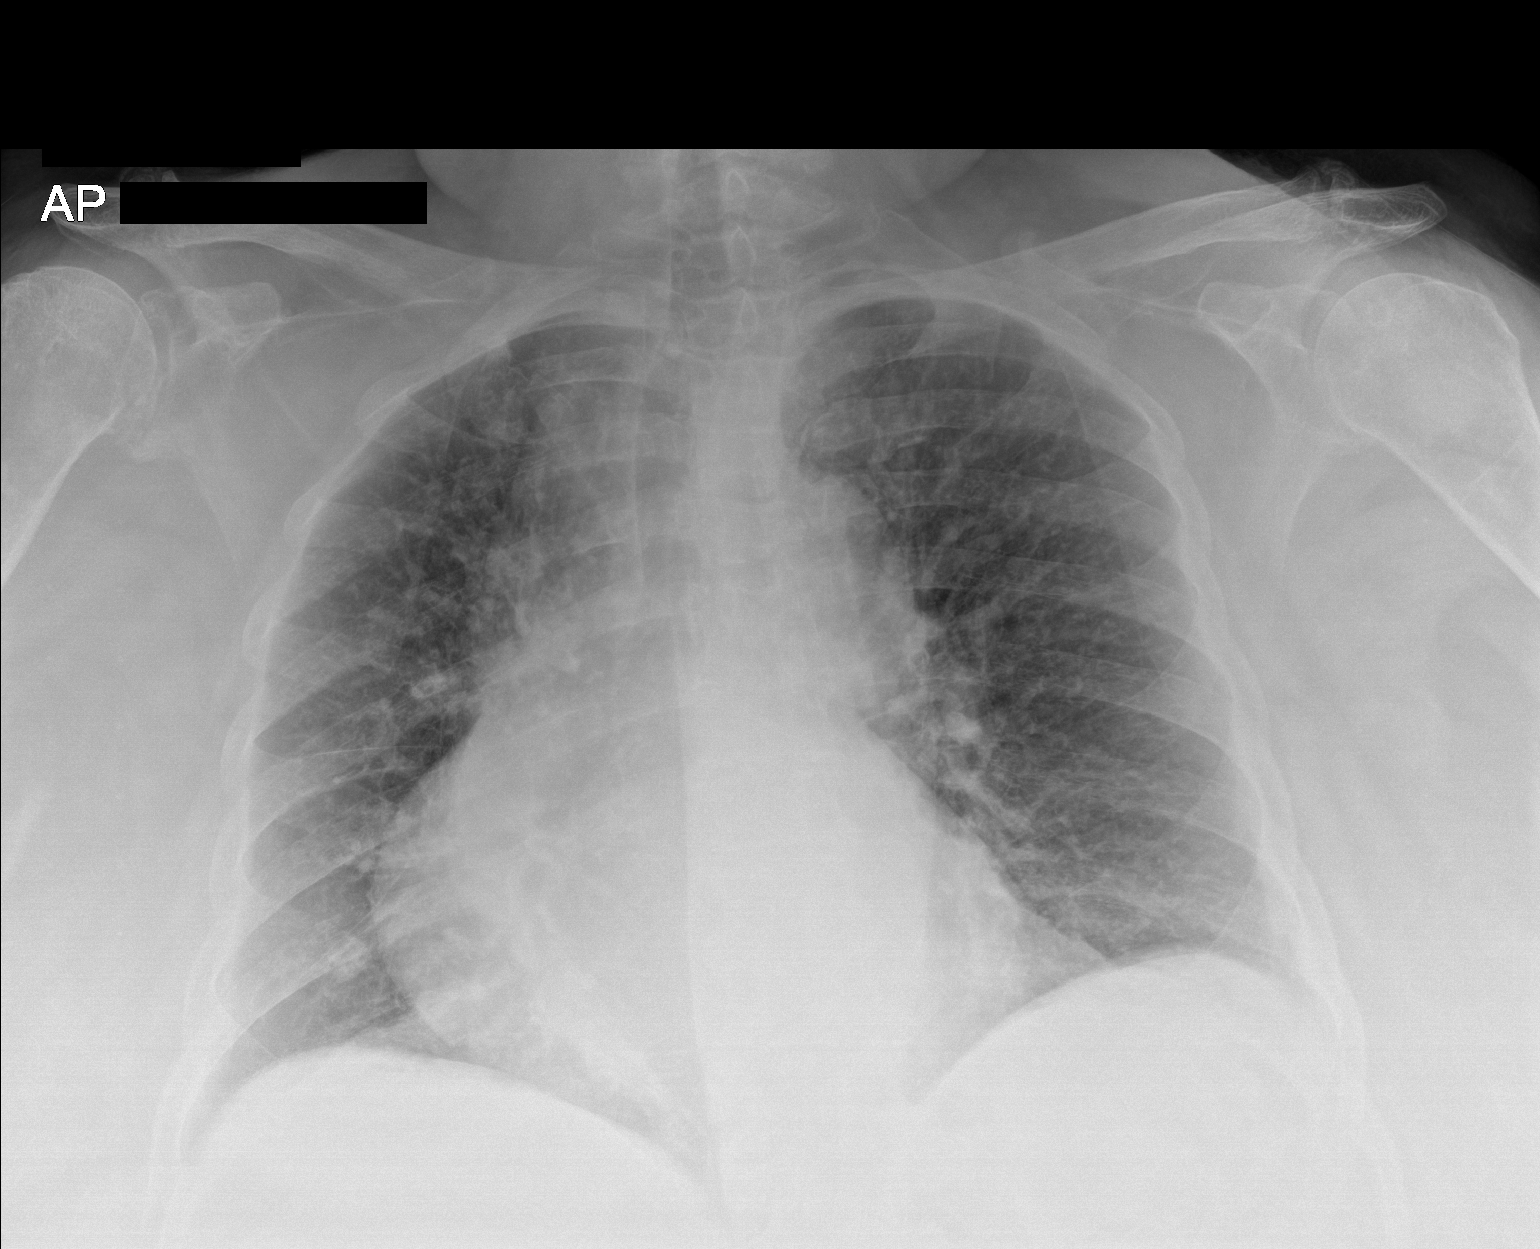

[1 of 1 positions shown; findings below may reference images not displayed]

FINDINGS: Patient is partially rotated to the right. Heart size is stable.
Both lungs are clear.
IMPRESSION: No active disease.

## 2023-03-08 IMAGING — DX DG FOOT 2V*R*
2 series · 2 of 2 positions shown · non-contrast
Comparison: None Available.

CLINICAL DATA: Injury of second toe.

EXAM:
RIGHT FOOT - 2 VIEW

[foot ap]
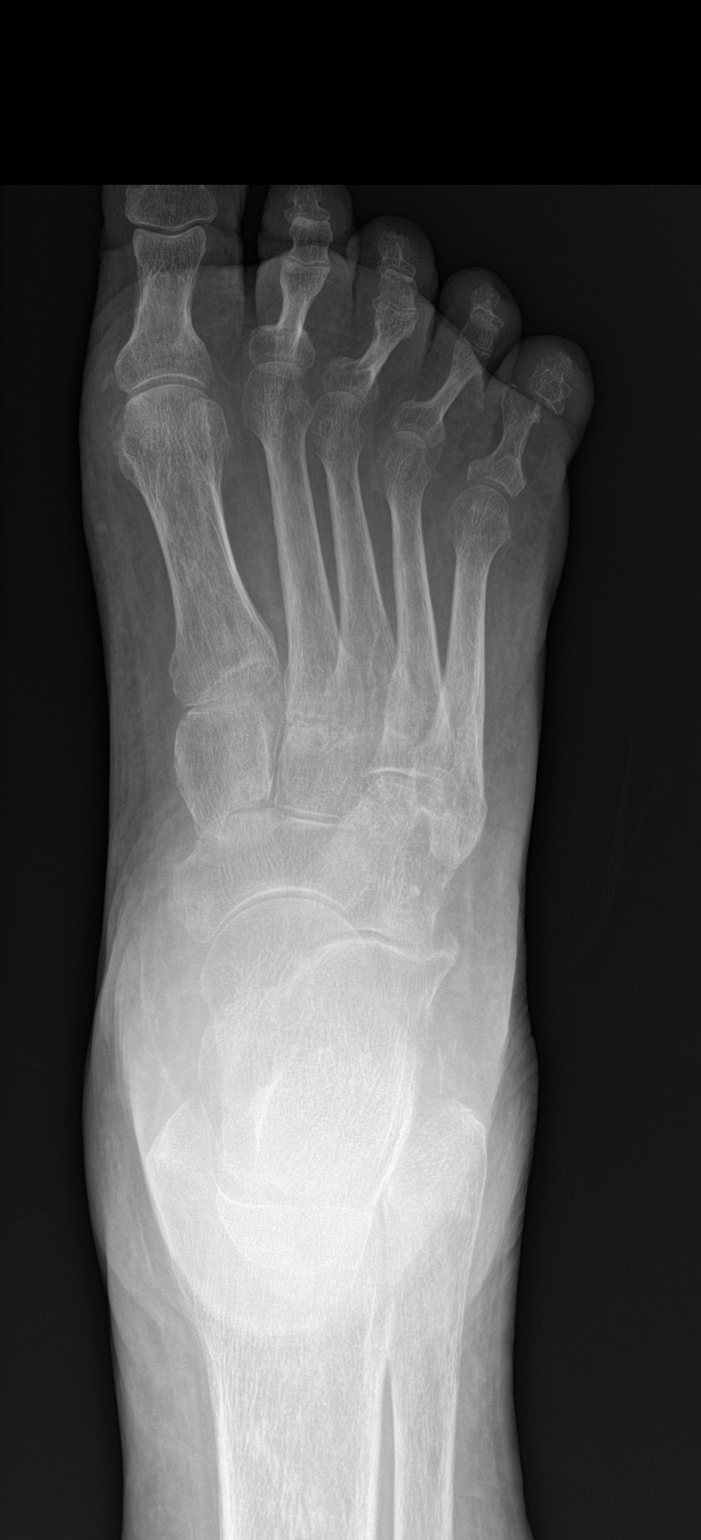

[foot lat]
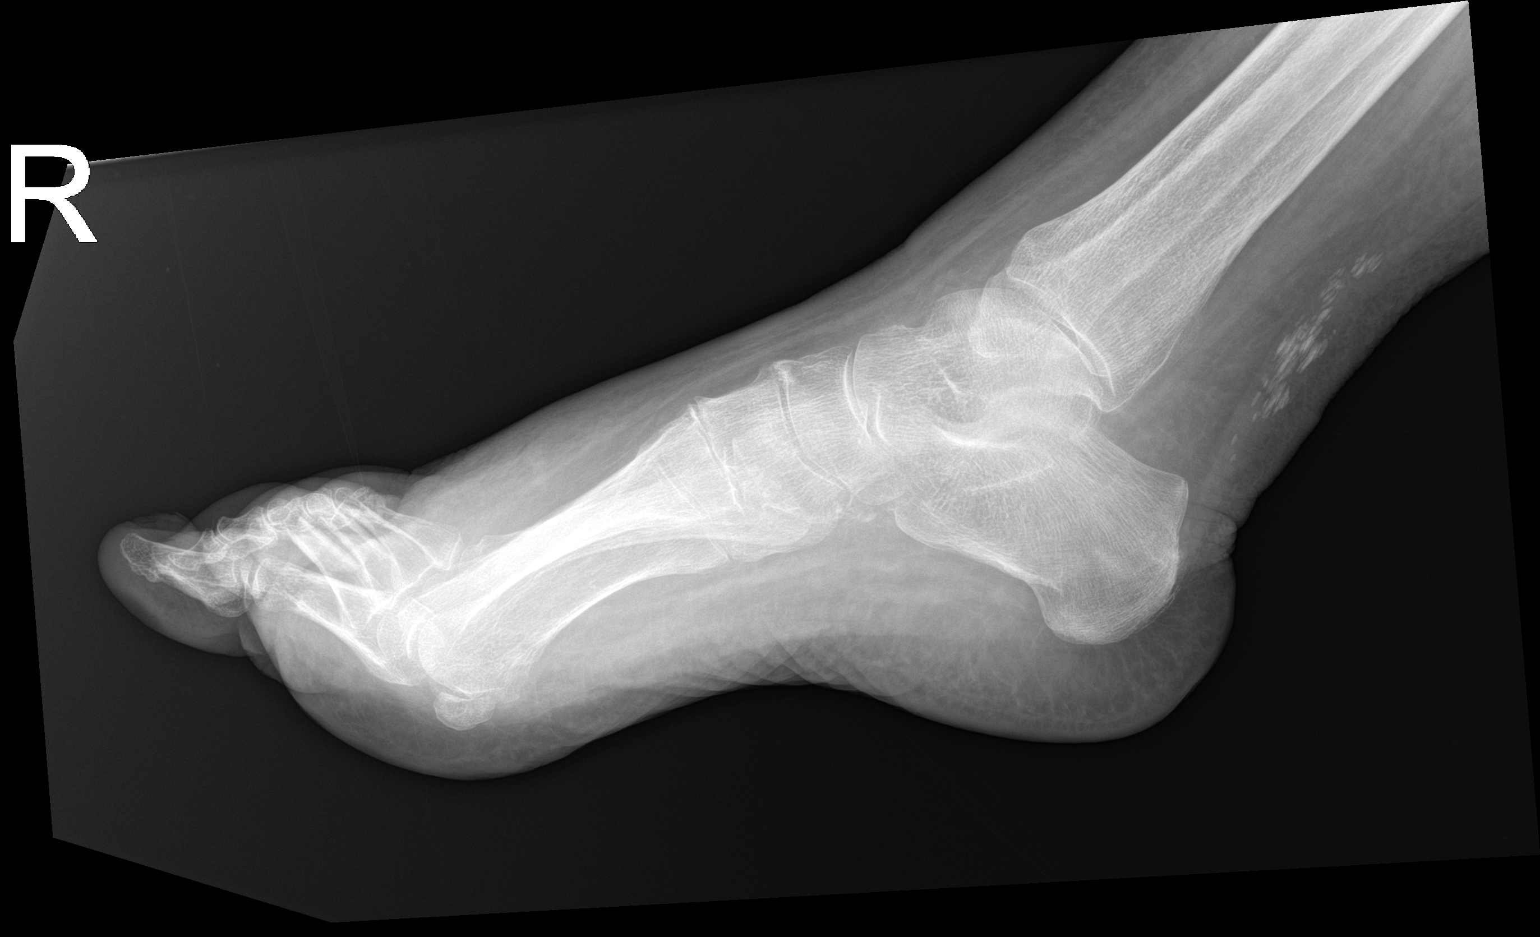

[2 of 2 positions shown; findings below may reference images not displayed]

FINDINGS: AP and lateral views of the foot obtained. There is no evidence of
second toe fracture or dislocation. There is lateral subluxation of
the fifth toe at the proximal interphalangeal joint. Subchondral
cyst or erosion is noted involving the proximal phalanx at the
interphalangeal joint. Acuity is uncertain. Minor degenerative
change in the midfoot with dorsal spurring. Diffuse soft tissue
edema. No soft tissue gas or radiopaque foreign body. Irregular
calcifications in the region of the mid Achilles.
IMPRESSION: 1. Lateral subluxation of the fifth toe at the proximal
interphalangeal joint, of uncertain acuity. There is an a erosion or
subchondral cyst involving the proximal phalanx at the
interphalangeal joint.
2. No fracture or dislocation of the second toe.
3. Diffuse soft tissue edema.
